# Patient Record
Sex: Male | Born: 1950 | Race: White | Hispanic: No | Marital: Married | Smoking: Former smoker
Health system: Southern US, Community
[De-identification: ages and names within clinical notes are randomized; demographics above are authoritative.]

## PROBLEM LIST (undated history)

## (undated) DIAGNOSIS — E785 Hyperlipidemia, unspecified: Secondary | ICD-10-CM

## (undated) DIAGNOSIS — M1990 Unspecified osteoarthritis, unspecified site: Secondary | ICD-10-CM

## (undated) DIAGNOSIS — I82409 Acute embolism and thrombosis of unspecified deep veins of unspecified lower extremity: Secondary | ICD-10-CM

## (undated) DIAGNOSIS — I340 Nonrheumatic mitral (valve) insufficiency: Secondary | ICD-10-CM

## (undated) DIAGNOSIS — F329 Major depressive disorder, single episode, unspecified: Secondary | ICD-10-CM

## (undated) DIAGNOSIS — D649 Anemia, unspecified: Secondary | ICD-10-CM

## (undated) DIAGNOSIS — E669 Obesity, unspecified: Secondary | ICD-10-CM

## (undated) DIAGNOSIS — G2581 Restless legs syndrome: Secondary | ICD-10-CM

## (undated) DIAGNOSIS — R0600 Dyspnea, unspecified: Secondary | ICD-10-CM

## (undated) DIAGNOSIS — E119 Type 2 diabetes mellitus without complications: Secondary | ICD-10-CM

## (undated) DIAGNOSIS — I351 Nonrheumatic aortic (valve) insufficiency: Principal | ICD-10-CM

## (undated) DIAGNOSIS — G4733 Obstructive sleep apnea (adult) (pediatric): Secondary | ICD-10-CM

## (undated) DIAGNOSIS — Z8679 Personal history of other diseases of the circulatory system: Secondary | ICD-10-CM

## (undated) DIAGNOSIS — I509 Heart failure, unspecified: Secondary | ICD-10-CM

## (undated) DIAGNOSIS — I2510 Atherosclerotic heart disease of native coronary artery without angina pectoris: Secondary | ICD-10-CM

## (undated) DIAGNOSIS — F32A Depression, unspecified: Secondary | ICD-10-CM

## (undated) DIAGNOSIS — I10 Essential (primary) hypertension: Secondary | ICD-10-CM

## (undated) DIAGNOSIS — K219 Gastro-esophageal reflux disease without esophagitis: Secondary | ICD-10-CM

## (undated) HISTORY — DX: Depression, unspecified: F32.A

## (undated) HISTORY — DX: Major depressive disorder, single episode, unspecified: F32.9

## (undated) HISTORY — DX: Hyperlipidemia, unspecified: E78.5

## (undated) HISTORY — DX: Acute embolism and thrombosis of unspecified deep veins of unspecified lower extremity: I82.409

## (undated) HISTORY — DX: Obstructive sleep apnea (adult) (pediatric): G47.33

## (undated) HISTORY — DX: Dyspnea, unspecified: R06.00

## (undated) HISTORY — PX: COLONOSCOPY W/ POLYPECTOMY: SHX1380

## (undated) HISTORY — DX: Obesity, unspecified: E66.9

## (undated) HISTORY — DX: Nonrheumatic mitral (valve) insufficiency: I34.0

## (undated) HISTORY — DX: Type 2 diabetes mellitus without complications: E11.9

## (undated) HISTORY — PX: ESOPHAGOGASTRODUODENOSCOPY ENDOSCOPY: SHX5814

## (undated) HISTORY — DX: Atherosclerotic heart disease of native coronary artery without angina pectoris: I25.10

## (undated) HISTORY — PX: IVC FILTER PLACEMENT (ARMC HX): HXRAD1551

## (undated) HISTORY — DX: Nonrheumatic aortic (valve) insufficiency: I35.1

## (undated) HISTORY — DX: Personal history of other diseases of the circulatory system: Z86.79

## (undated) HISTORY — DX: Essential (primary) hypertension: I10

## (undated) HISTORY — PX: THYROIDECTOMY: SHX17

---

## 2006-06-24 HISTORY — PX: CARDIAC CATHETERIZATION: SHX172

## 2007-01-02 ENCOUNTER — Ambulatory Visit: Payer: Self-pay | Admitting: Cardiology

## 2007-01-03 ENCOUNTER — Encounter: Payer: Self-pay | Admitting: Cardiology

## 2007-01-15 ENCOUNTER — Ambulatory Visit: Payer: Self-pay | Admitting: Cardiology

## 2007-01-20 ENCOUNTER — Ambulatory Visit: Payer: Self-pay | Admitting: Cardiovascular Disease

## 2007-01-20 ENCOUNTER — Inpatient Hospital Stay (HOSPITAL_BASED_OUTPATIENT_CLINIC_OR_DEPARTMENT_OTHER): Admission: RE | Admit: 2007-01-20 | Discharge: 2007-01-20 | Payer: Self-pay | Admitting: Cardiovascular Disease

## 2007-01-23 ENCOUNTER — Ambulatory Visit: Payer: Self-pay | Admitting: Physician Assistant

## 2007-01-27 ENCOUNTER — Ambulatory Visit: Payer: Self-pay | Admitting: Cardiology

## 2007-02-03 ENCOUNTER — Ambulatory Visit: Payer: Self-pay | Admitting: Cardiology

## 2007-02-10 ENCOUNTER — Ambulatory Visit: Payer: Self-pay | Admitting: Cardiology

## 2007-02-20 ENCOUNTER — Ambulatory Visit: Payer: Self-pay | Admitting: Cardiology

## 2007-03-05 ENCOUNTER — Ambulatory Visit: Payer: Self-pay | Admitting: Cardiology

## 2007-03-23 ENCOUNTER — Ambulatory Visit: Payer: Self-pay | Admitting: Cardiology

## 2007-04-20 ENCOUNTER — Ambulatory Visit: Payer: Self-pay | Admitting: Cardiology

## 2007-05-04 ENCOUNTER — Ambulatory Visit: Payer: Self-pay | Admitting: Cardiology

## 2007-05-20 ENCOUNTER — Ambulatory Visit: Payer: Self-pay | Admitting: Cardiology

## 2007-05-26 ENCOUNTER — Ambulatory Visit: Payer: Self-pay | Admitting: Cardiology

## 2007-06-10 ENCOUNTER — Ambulatory Visit: Payer: Self-pay | Admitting: Cardiology

## 2007-07-06 ENCOUNTER — Ambulatory Visit: Payer: Self-pay | Admitting: Cardiology

## 2007-07-20 ENCOUNTER — Ambulatory Visit: Payer: Self-pay | Admitting: Cardiology

## 2007-08-10 ENCOUNTER — Ambulatory Visit: Payer: Self-pay | Admitting: Cardiology

## 2007-09-01 ENCOUNTER — Ambulatory Visit: Payer: Self-pay | Admitting: Cardiology

## 2007-09-03 ENCOUNTER — Ambulatory Visit: Payer: Self-pay | Admitting: Cardiology

## 2007-09-07 ENCOUNTER — Ambulatory Visit: Payer: Self-pay | Admitting: Cardiology

## 2007-09-10 ENCOUNTER — Encounter: Admission: RE | Admit: 2007-09-10 | Discharge: 2007-09-10 | Payer: Self-pay | Admitting: Endocrinology

## 2007-09-10 ENCOUNTER — Ambulatory Visit: Payer: Self-pay | Admitting: Cardiology

## 2007-09-10 ENCOUNTER — Encounter (INDEPENDENT_AMBULATORY_CARE_PROVIDER_SITE_OTHER): Payer: Self-pay | Admitting: Interventional Radiology

## 2007-09-10 ENCOUNTER — Other Ambulatory Visit: Admission: RE | Admit: 2007-09-10 | Discharge: 2007-09-10 | Payer: Self-pay | Admitting: Interventional Radiology

## 2007-12-21 ENCOUNTER — Ambulatory Visit: Payer: Self-pay | Admitting: Cardiology

## 2007-12-26 ENCOUNTER — Encounter: Payer: Self-pay | Admitting: Cardiology

## 2008-01-29 ENCOUNTER — Ambulatory Visit: Payer: Self-pay | Admitting: Cardiology

## 2008-02-03 ENCOUNTER — Ambulatory Visit: Payer: Self-pay | Admitting: Cardiology

## 2008-02-11 ENCOUNTER — Ambulatory Visit: Payer: Self-pay | Admitting: Cardiology

## 2008-02-19 ENCOUNTER — Encounter (HOSPITAL_BASED_OUTPATIENT_CLINIC_OR_DEPARTMENT_OTHER): Payer: Self-pay | Admitting: General Surgery

## 2008-02-19 ENCOUNTER — Ambulatory Visit (HOSPITAL_COMMUNITY): Admission: RE | Admit: 2008-02-19 | Discharge: 2008-02-20 | Payer: Self-pay | Admitting: General Surgery

## 2008-02-24 ENCOUNTER — Ambulatory Visit: Payer: Self-pay | Admitting: Cardiology

## 2008-02-26 ENCOUNTER — Ambulatory Visit: Payer: Self-pay | Admitting: Cardiology

## 2008-03-01 ENCOUNTER — Ambulatory Visit: Payer: Self-pay | Admitting: Cardiology

## 2008-03-08 ENCOUNTER — Encounter: Payer: Self-pay | Admitting: Cardiology

## 2008-03-09 ENCOUNTER — Ambulatory Visit: Payer: Self-pay | Admitting: Cardiology

## 2008-04-05 ENCOUNTER — Ambulatory Visit: Payer: Self-pay | Admitting: Cardiology

## 2008-05-03 ENCOUNTER — Ambulatory Visit: Payer: Self-pay | Admitting: Cardiology

## 2008-05-13 ENCOUNTER — Ambulatory Visit: Payer: Self-pay | Admitting: Cardiology

## 2008-05-18 ENCOUNTER — Ambulatory Visit: Payer: Self-pay | Admitting: Cardiology

## 2008-06-09 ENCOUNTER — Encounter: Payer: Self-pay | Admitting: Cardiology

## 2008-07-28 ENCOUNTER — Ambulatory Visit: Payer: Self-pay | Admitting: Cardiology

## 2008-08-09 ENCOUNTER — Encounter: Payer: Self-pay | Admitting: Cardiology

## 2008-08-09 ENCOUNTER — Ambulatory Visit: Payer: Self-pay | Admitting: Cardiology

## 2008-09-28 ENCOUNTER — Encounter: Admission: RE | Admit: 2008-09-28 | Discharge: 2008-12-27 | Payer: Self-pay | Admitting: Endocrinology

## 2008-09-29 ENCOUNTER — Ambulatory Visit: Payer: Self-pay | Admitting: Cardiology

## 2008-10-07 ENCOUNTER — Ambulatory Visit: Payer: Self-pay | Admitting: Cardiology

## 2008-10-14 ENCOUNTER — Ambulatory Visit: Payer: Self-pay | Admitting: Cardiology

## 2008-10-25 ENCOUNTER — Ambulatory Visit: Payer: Self-pay | Admitting: Cardiology

## 2008-11-08 ENCOUNTER — Ambulatory Visit: Payer: Self-pay | Admitting: Cardiology

## 2008-11-18 ENCOUNTER — Encounter: Payer: Self-pay | Admitting: Cardiology

## 2008-11-24 ENCOUNTER — Ambulatory Visit: Payer: Self-pay | Admitting: Cardiology

## 2008-12-01 ENCOUNTER — Encounter: Payer: Self-pay | Admitting: Cardiology

## 2008-12-02 ENCOUNTER — Ambulatory Visit: Payer: Self-pay | Admitting: Cardiology

## 2009-01-02 ENCOUNTER — Ambulatory Visit: Payer: Self-pay | Admitting: Cardiology

## 2009-02-06 ENCOUNTER — Encounter: Payer: Self-pay | Admitting: *Deleted

## 2009-02-14 ENCOUNTER — Ambulatory Visit: Payer: Self-pay | Admitting: Cardiology

## 2009-02-14 LAB — CONVERTED CEMR LAB
POC INR: 2.8
Prothrombin Time: 20.4 s

## 2009-02-20 ENCOUNTER — Ambulatory Visit: Payer: Self-pay | Admitting: Cardiology

## 2009-02-20 DIAGNOSIS — F329 Major depressive disorder, single episode, unspecified: Secondary | ICD-10-CM | POA: Insufficient documentation

## 2009-02-20 DIAGNOSIS — R079 Chest pain, unspecified: Secondary | ICD-10-CM | POA: Insufficient documentation

## 2009-02-20 DIAGNOSIS — L989 Disorder of the skin and subcutaneous tissue, unspecified: Secondary | ICD-10-CM | POA: Insufficient documentation

## 2009-02-20 DIAGNOSIS — I87009 Postthrombotic syndrome without complications of unspecified extremity: Secondary | ICD-10-CM | POA: Insufficient documentation

## 2009-02-20 DIAGNOSIS — I82409 Acute embolism and thrombosis of unspecified deep veins of unspecified lower extremity: Secondary | ICD-10-CM | POA: Insufficient documentation

## 2009-02-20 DIAGNOSIS — I10 Essential (primary) hypertension: Secondary | ICD-10-CM | POA: Insufficient documentation

## 2009-02-20 DIAGNOSIS — I2510 Atherosclerotic heart disease of native coronary artery without angina pectoris: Secondary | ICD-10-CM | POA: Insufficient documentation

## 2009-02-20 DIAGNOSIS — F32A Depression, unspecified: Secondary | ICD-10-CM | POA: Insufficient documentation

## 2009-02-20 DIAGNOSIS — T79A29A Traumatic compartment syndrome of unspecified lower extremity, initial encounter: Secondary | ICD-10-CM

## 2009-02-20 DIAGNOSIS — G4733 Obstructive sleep apnea (adult) (pediatric): Secondary | ICD-10-CM | POA: Insufficient documentation

## 2009-02-20 LAB — CONVERTED CEMR LAB
POC INR: 5.6
Prothrombin Time: 28.6 s

## 2009-02-21 ENCOUNTER — Inpatient Hospital Stay (HOSPITAL_COMMUNITY): Admission: AD | Admit: 2009-02-21 | Discharge: 2009-02-23 | Payer: Self-pay | Admitting: Cardiology

## 2009-02-21 ENCOUNTER — Encounter: Payer: Self-pay | Admitting: Cardiology

## 2009-02-21 ENCOUNTER — Telehealth: Payer: Self-pay | Admitting: Cardiology

## 2009-02-21 ENCOUNTER — Ambulatory Visit: Payer: Self-pay | Admitting: Vascular Surgery

## 2009-02-21 ENCOUNTER — Ambulatory Visit: Payer: Self-pay | Admitting: Internal Medicine

## 2009-02-22 ENCOUNTER — Encounter: Payer: Self-pay | Admitting: Cardiology

## 2009-02-28 ENCOUNTER — Encounter: Payer: Self-pay | Admitting: Cardiology

## 2009-03-03 ENCOUNTER — Encounter (INDEPENDENT_AMBULATORY_CARE_PROVIDER_SITE_OTHER): Payer: Self-pay | Admitting: *Deleted

## 2009-03-09 ENCOUNTER — Ambulatory Visit: Payer: Self-pay | Admitting: Cardiology

## 2009-03-15 ENCOUNTER — Encounter: Payer: Self-pay | Admitting: Cardiology

## 2009-03-16 ENCOUNTER — Encounter: Payer: Self-pay | Admitting: Cardiology

## 2009-03-17 ENCOUNTER — Encounter: Payer: Self-pay | Admitting: Cardiology

## 2009-03-29 ENCOUNTER — Encounter: Payer: Self-pay | Admitting: Cardiology

## 2009-04-05 ENCOUNTER — Telehealth: Payer: Self-pay | Admitting: Cardiology

## 2009-04-11 ENCOUNTER — Encounter: Payer: Self-pay | Admitting: Cardiology

## 2009-04-14 ENCOUNTER — Encounter: Payer: Self-pay | Admitting: Physician Assistant

## 2009-04-18 ENCOUNTER — Telehealth (INDEPENDENT_AMBULATORY_CARE_PROVIDER_SITE_OTHER): Payer: Self-pay | Admitting: *Deleted

## 2009-04-27 ENCOUNTER — Encounter: Payer: Self-pay | Admitting: Cardiology

## 2009-04-28 ENCOUNTER — Telehealth (INDEPENDENT_AMBULATORY_CARE_PROVIDER_SITE_OTHER): Payer: Self-pay | Admitting: *Deleted

## 2009-05-01 ENCOUNTER — Encounter: Payer: Self-pay | Admitting: Cardiology

## 2009-05-02 ENCOUNTER — Encounter: Payer: Self-pay | Admitting: Cardiology

## 2009-05-04 ENCOUNTER — Encounter: Payer: Self-pay | Admitting: Cardiology

## 2009-07-14 ENCOUNTER — Encounter: Payer: Self-pay | Admitting: Cardiology

## 2009-07-21 ENCOUNTER — Encounter (INDEPENDENT_AMBULATORY_CARE_PROVIDER_SITE_OTHER): Payer: Self-pay | Admitting: *Deleted

## 2009-07-21 ENCOUNTER — Ambulatory Visit: Payer: Self-pay | Admitting: Cardiology

## 2009-07-21 DIAGNOSIS — R0602 Shortness of breath: Secondary | ICD-10-CM

## 2009-07-23 ENCOUNTER — Encounter: Payer: Self-pay | Admitting: Cardiology

## 2009-07-24 ENCOUNTER — Encounter: Payer: Self-pay | Admitting: Cardiology

## 2009-07-25 ENCOUNTER — Ambulatory Visit: Payer: Self-pay | Admitting: Cardiovascular Disease

## 2009-07-25 ENCOUNTER — Inpatient Hospital Stay (HOSPITAL_BASED_OUTPATIENT_CLINIC_OR_DEPARTMENT_OTHER): Admission: RE | Admit: 2009-07-25 | Discharge: 2009-07-25 | Payer: Self-pay | Admitting: Cardiovascular Disease

## 2009-07-25 HISTORY — PX: CARDIAC CATHETERIZATION: SHX172

## 2009-08-07 ENCOUNTER — Ambulatory Visit: Payer: Self-pay | Admitting: Cardiology

## 2009-12-14 ENCOUNTER — Encounter: Payer: Self-pay | Admitting: Cardiology

## 2009-12-15 ENCOUNTER — Ambulatory Visit: Payer: Self-pay | Admitting: Cardiology

## 2009-12-16 ENCOUNTER — Encounter: Payer: Self-pay | Admitting: Cardiology

## 2009-12-18 ENCOUNTER — Encounter: Payer: Self-pay | Admitting: Cardiology

## 2009-12-19 ENCOUNTER — Encounter: Payer: Self-pay | Admitting: Cardiology

## 2009-12-19 ENCOUNTER — Ambulatory Visit (HOSPITAL_COMMUNITY): Admission: RE | Admit: 2009-12-19 | Discharge: 2009-12-19 | Payer: Self-pay | Admitting: Internal Medicine

## 2009-12-20 ENCOUNTER — Ambulatory Visit: Payer: Self-pay | Admitting: Cardiology

## 2010-01-01 ENCOUNTER — Encounter: Payer: Self-pay | Admitting: Cardiology

## 2010-01-08 ENCOUNTER — Encounter: Payer: Self-pay | Admitting: Cardiology

## 2010-01-10 ENCOUNTER — Encounter: Payer: Self-pay | Admitting: Cardiology

## 2010-01-19 ENCOUNTER — Encounter: Payer: Self-pay | Admitting: Cardiology

## 2010-01-22 ENCOUNTER — Encounter: Payer: Self-pay | Admitting: Cardiology

## 2010-01-25 ENCOUNTER — Encounter: Payer: Self-pay | Admitting: Cardiology

## 2010-01-29 ENCOUNTER — Encounter: Payer: Self-pay | Admitting: Cardiology

## 2010-03-20 ENCOUNTER — Ambulatory Visit: Payer: Self-pay | Admitting: Cardiology

## 2010-03-20 DIAGNOSIS — I38 Endocarditis, valve unspecified: Secondary | ICD-10-CM

## 2010-03-22 ENCOUNTER — Encounter: Payer: Self-pay | Admitting: Cardiology

## 2010-03-26 ENCOUNTER — Encounter: Payer: Self-pay | Admitting: Cardiology

## 2010-03-30 ENCOUNTER — Encounter (INDEPENDENT_AMBULATORY_CARE_PROVIDER_SITE_OTHER): Payer: Self-pay | Admitting: *Deleted

## 2010-07-13 ENCOUNTER — Telehealth (INDEPENDENT_AMBULATORY_CARE_PROVIDER_SITE_OTHER): Payer: Self-pay | Admitting: *Deleted

## 2010-07-24 NOTE — Assessment & Plan Note (Signed)
Summary: 6 MO FU PER AUG REMINDER-SRS   Visit Type:  Follow-up Primary Provider:  Lynden Oxford   History of Present Illness: the patient is a 60 year old male with a history of hypercoagulable state on chronic Lovenox therapy. 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 This was a pansensitive organism with vegetation on the mitral valve. Patient underwent an eight-week course of antibiotics through a PICC line. The PICC line has been removed. His most recent blood work is from August 2011 his creatinine was 1.47 high sensitivity CRP was within normal limits hemoglobin was 10.9 and sedimentation rate 41 mm/h. Blood work was taken when the patient is still taking antibiotics.  The patient is doing well. He denies any substernal chest pain. He denies any shortness of breath. He reports no recurrent fevers or chills nausea or vomiting. He has no palpitations or syncope.  Preventive Screening-Counseling & Management  Alcohol-Tobacco     Smoking Status: quit     Year Quit: 1970  Current Medications (verified): 1)  Simvastatin 40 Mg Tabs (Simvastatin) .... Take 1 Tablet By Mouth At Bedtime 2)  Lisinopril 20 Mg Tabs (Lisinopril) .... Take One Tablet By Mouth Daily 3)  Metformin Hcl 500 Mg Tabs (Metformin Hcl) .... Take 1 Tablet By Mouth Two Times A Day 4)  Levothroid 200 Mcg Tabs (Levothyroxine Sodium) .... Take 1 Tablet By Mouth Once A Day 5)  Chlorthalidone 25 Mg Tabs (Chlorthalidone) .... Take 1 Tablet By Mouth Once A Day 6)  Prilosec 20 Mg Cpdr (Omeprazole) .... Take 1 Tablet By Mouth Once A Day 7)  Hydrocodone-Acetaminophen 5-500 Mg Tabs (Hydrocodone-Acetaminophen) .... As Needed Pain 8)  Nitroglycerin 0.4 Mg Subl (Nitroglycerin) .... One Tablet Under Tongue Every 5 Minutes As Needed For Chest Pain---May Repeat Times Three 9)  Lovenox 120 Mg/0.77ml Soln (Enoxaparin Sodium) .... Inject Sub Q Two Times A Day 10)   Labetalol Hcl 200 Mg Tabs (Labetalol Hcl) .... Take 1 Tablet By Mouth Two Times A Day  (Place On File) 11)  Cymbalta 60 Mg Cpep (Duloxetine Hcl) .... Take 1 Tablet By Mouth Every Morning 12)  Tylenol 325 Mg Tabs (Acetaminophen) .... As Needed 13)  Imdur 30 Mg Xr24h-Tab (Isosorbide Mononitrate) .... Take 1 Tablet By Mouth Once A Day 14)  Clonazepam 0.5 Mg Tabs (Clonazepam) .... Take 1 Tablet By Mouth Once A Day  Allergies (verified): No Known Drug Allergies  Comments:  Nurse/Medical Assistant: The patient's medication list and allergies were reviewed with the patient and were updated in the Medication and Allergy Lists.  Past History:  Family History: Last updated: 02/20/2009 Family History of Coronary Artery Disease:   Social History: Last updated: 02/20/2009 Disabled  Married  Tobacco Use - No.  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  Review of Systems       The patient complains of muscle weakness, leg swelling, and depression.  The  patient denies fatigue, malaise, fever, weight gain/loss, vision loss, decreased hearing, hoarseness, chest pain, palpitations, shortness of breath, prolonged cough, wheezing, sleep apnea, coughing up blood, abdominal pain, blood  in stool, nausea, vomiting, diarrhea, heartburn, incontinence, blood in urine, joint pain, rash, skin lesions, headache, fainting, dizziness, anxiety, enlarged lymph nodes, easy bruising or bleeding, and environmental allergies.    Vital Signs:  Patient profile:   60 year old male Height:      71 inches Weight:      294 pounds BMI:     41.15 Pulse rate:   80 / minute BP sitting:   112 / 69  (left arm) Cuff size:   large  Vitals Entered By: Carlye Grippe (March 20, 2010 10:04 AM)  Nutrition Counseling: Patient's BMI is greater than 25 and therefore counseled on weight management options.  Physical Exam  Additional Exam:  General: Well-developed, well-nourished in no distress head: Normocephalic and atraumatic eyes PERRLA/EOMI intact, conjunctiva and lids normal nose: No deformity or lesions mouth normal dentition, normal posterior pharynx neck: Supple, no JVD.  No masses, thyromegaly or abnormal cervical nodes lungs: Normal breath sounds bilaterally without wheezing.  Normal percussion heart: regular rate and rhythm with normal S1 and S2, no S3 or S4.  PMI is normal.  No pathological murmurs abdomen: Normal bowel sounds, abdomen is soft and nontender without masses, organomegaly or hernias noted.  No hepatosplenomegaly musculoskeletal: Back normal, normal gait muscle strength and tone normal pulsus: Pulse is normal in all 4 extremities Extremities:one to 2+ peripheral pitting edema.  Marked lower extremity varicosities. neurologic: Alert and oriented x 3 skin: Intact without lesions or rashes cervical nodes: No significant adenopathy psychologic: Normal affect    Impression & Recommendations:  Problem # 1:  ENDOCARDITIS (ICD-424.90) the patient is status post successful treatment for endocarditis. He has no recurrent fever or chills. Antibiotics have been discontinued.  Problem # 2:  COUMADIN THERAPY (ICD-V58.61) the patient is on chronic Lovenox therapy status post IVC  filter. 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 One of the newer direct antithrombin agents or antiXa inhibitors may be used once approved by the FDA.  Problem # 3:  SLEEP APNEA, OBSTRUCTIVE (ICD-327.23) Assessment: Comment Only  Problem # 4:  POSTPHLEBITIC SYNDROME (ICD-459.10) Assessment: Comment Only  Problem # 5:  CORONARY ARTERY DISEASE (ICD-414.00) the patient reports no recurrent chest pain. His blood pressures now also well controlled with a combination of labetalol and chlorthalidone. His updated medication list for this problem includes:    Lisinopril 20 Mg Tabs (Lisinopril) .Marland Kitchen... Take one tablet by mouth daily    Nitroglycerin 0.4 Mg Subl (Nitroglycerin) ..... One tablet under tongue every 5 minutes as needed for chest pain---may repeat times three    Lovenox 120 Mg/0.55ml Soln (Enoxaparin sodium) ..... Inject sub q two times a day    Labetalol Hcl 200 Mg Tabs (Labetalol hcl) .Marland Kitchen... Take 1 tablet by mouth two times a day  (place on file)    Imdur 30 Mg Xr24h-tab (Isosorbide mononitrate) .Marland Kitchen... Take 1 tablet by mouth once a day  Patient Instructions: 1)  Your physician recommends that you continue on your current medications as directed. Please refer to the Current Medication list given to you today. 2)  Follow up in  6 months

## 2010-07-24 NOTE — Letter (Signed)
Summary: Cardiac Cath Instructions - [facility]  [facility] [address]   [place], [place] [address]   Phone: [phone]  Fax: [phone]     07/21/2009 MRN: [id number]  [facility] [address] [place], [place]  [address]  Dear Mr. [name] [name],   You are scheduled for a Cardiac Catheterization on Tuesday, July 25, 2009 at 8:30 with Dr. Excell [name].  Please arrive to the 1st floor of the Heart and Vascular Center at [facility] at 7:30am / pm on the day of your procedure. Please do not arrive before 6:30 a.m. Call the Heart and Vascular Center at [phone] if you are unable to make your appointmnet. The Code to get into the parking garage under the building is 0900. Take the elevators to the 1st floor. You must have someone to drive you home. Someone must be with you for the first 24 hours after you arrive home. Please wear clothes that are easy to get on and off and wear slip-on shoes. Do not eat or drink after midnight except water with your medications that morning. Bring all your medications and current insurance cards with you.  _X__ DO NOT take these medications before your procedure:  HCTZ - a.m of                procedure, Metformin - 24 hours before & 48 hours after.  ___ Make sure you take your aspirin.  _X__ You may take ALL of your medications with water that morning except           those listed above.  ___ DO NOT take ANY medications before your procedure.  ___ Pre-med instructions:  ________________________________________________________________________  The usual length of stay after your procedure is 2 to 3 hours. This can vary.  If you have any questions, please call the office at the number listed above.  Hoover Brunette, LPN          Directions to the [facility]  Please Note : Park in [place] under the building not the parking deck.  From [employer]: Turn onto [address] Left  onto [place] (1st stoplight) Right at the brick entrance to the hospital ([address]) Bear to the right and you will see a blue sign "Heart and Vascular Center" Parking garage is a sharp right'to get through the gate out in the code _______. Once you park, take the elevator to the first floor. Please do not arrive before 0630am. The building will be dark before that time.   From [address] Turn onto [address] Turn left into the brick entrance to the hospital (Main circle [address]) Bear to the right and you will see a blue sign "[facility] Center" Parking garage is a sharp right, to get thru the gate put in the code ____. Once you park, take the elevator to the first floor. Please do not arrive before 0630am. The building will be dark before that time

## 2010-07-24 NOTE — Letter (Signed)
Summary: External Correspondence/ FAXED PRE-CATH ORDER  External Correspondence/ FAXED PRE-CATH ORDER   Imported By: Dorise Hiss 07/25/2009 15:17:21  _____________________________________________________________________  External Attachment:    Type:   Image     Comment:   External Document

## 2010-07-24 NOTE — Medication Information (Signed)
Summary: RX Folder/ FAXED LOVENOX REIMBUREMENT PROGRAM  RX Folder/ FAXED LOVENOX REIMBUREMENT PROGRAM   Imported By: Dorise Hiss 01/19/2010 14:11:19  _____________________________________________________________________  External Attachment:    Type:   Image     Comment:   External Document

## 2010-07-24 NOTE — Miscellaneous (Signed)
Summary: rx - lovenox  Clinical Lists Changes  Medications: Rx of LOVENOX 120 MG/0.8ML SOLN (ENOXAPARIN SODIUM) inject sub q two times a day;  #90 x 3;  Signed;  Entered by: Hoover Brunette, LPN;  Authorized by: Lewayne Bunting, MD, [facility];  Method used: Print then Give to Patient    Prescriptions: LOVENOX 120 MG/0.8ML SOLN (ENOXAPARIN SODIUM) inject sub q two times a day  #90 x 3   Entered by:   Hoover Brunette, LPN   Authorized by:   Lewayne Bunting, MD, [facility]   Signed by:   Hoover Brunette, LPN on 13/24/4010   Method used:   Print then Give to Patient   RxID:   [id number]

## 2010-07-24 NOTE — Medication Information (Signed)
Summary: RX Folder/ FAXED LOVENOX REIMBURSEMENT  RX Folder/ FAXED LOVENOX REIMBURSEMENT   Imported By: Dorise Hiss 01/26/2010 10:08:24  _____________________________________________________________________  External Attachment:    Type:   Image     Comment:   External Document

## 2010-07-24 NOTE — Assessment & Plan Note (Signed)
Summary: EPH-POST CATH   Visit Type:  Follow-up Primary Provider:  Kristian Covey PA  CC:  follow-up visit.  History of Present Illness: the patient is a 60 year old male with a history of obstructive sleep apnea, recurrent DVT and hypercoagulable state. Recently the patient reports increased substernal chest pain. He was referred for cardiac catheterization and was found to have nonobstructive CAD. He has normal LV function. He feels fatigued and is tired most of the time. He is struggling with significant depression. His blood pressures also poorly controlled. He reports no complications at this cardiac catheterization. He denies any orthopnea PND palpitations or syncope.  Preventive Screening-Counseling & Management  Alcohol-Tobacco     Smoking Status: quit     Year Quit: 1970  Clinical Review Panels:  Echocardiogram Echocardiogram 1. No diagnostic ST changes by standard criteria during dobutamine infusion.  PAC's and atrial runs noted.  No chest pain.         2. No clearly inducible wall motion abnormalities to suggest ischemia.          Appropriate augmentation in systolic function. (08/09/2008)    Current Medications (verified): 1)  Simvastatin 40 Mg Tabs (Simvastatin) .... Take 1 Tablet By Mouth At Bedtime 2)  Lisinopril 20 Mg Tabs (Lisinopril) .... Take One Tablet By Mouth Daily 3)  Metformin Hcl 500 Mg Tabs (Metformin Hcl) .... Take 1 Tablet By Mouth Two Times A Day 4)  Levothroid 200 Mcg Tabs (Levothyroxine Sodium) .... Take 1 Tablet By Mouth Once A Day 5)  Chlorthalidone 25 Mg Tabs (Chlorthalidone) .... Take 1 Tablet By Mouth Once A Day 6)  Prilosec 20 Mg Cpdr (Omeprazole) .... Take 1 Tablet By Mouth Once A Day 7)  Hydrocodone-Acetaminophen 5-500 Mg Tabs (Hydrocodone-Acetaminophen) .... As Needed Pain 8)  Nitroglycerin 0.4 Mg Subl (Nitroglycerin) .... One Tablet Under Tongue Every 5 Minutes As Needed For Chest Pain---May Repeat Times Three 9)  Lovenox 120 Mg/0.71ml Soln  (Enoxaparin Sodium) .... Inject Sub Q Two Times A Day 10)  Labetalol Hcl 200 Mg Tabs (Labetalol Hcl) .... Take 1 Tablet By Mouth Two Times A Day  (Place On File) 11)  Cymbalta 60 Mg Cpep (Duloxetine Hcl) .... Take 1 Tablet By Mouth Every Morning 12)  Tylenol 325 Mg Tabs (Acetaminophen) .... As Needed 13)  Imdur 30 Mg Xr24h-Tab (Isosorbide Mononitrate) .... Take 1 Tablet By Mouth Once A Day 14)  Clonazepam 0.5 Mg Tabs (Clonazepam) .... Take 1 Tablet By Mouth Once A Day  Allergies (verified): No Known Drug Allergies  Comments:  Nurse/Medical Assistant: The patient's medications and allergies were reviewed with the patient and were updated in the Medication and Allergy Lists. List reviewed.  Past History:  Family History: Last updated: 02/20/2009 Family History of Coronary Artery Disease:   Social History: Last updated: 02/20/2009 Disabled  Married  Tobacco Use - No.  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 nonobstructive coronary artery disease by catheterization in 2008 with normal LV function, nonobstructive coronary artery disease by repeat cardiac catheterization in February of 2010 hypertension controlled obesity remote tobacco use  Review of Systems       The patient complains of fatigue, weight gain/loss, and depression.  The patient denies malaise, fever, vision loss, decreased hearing, hoarseness,  chest pain, palpitations, prolonged cough, wheezing, sleep apnea, coughing up blood, abdominal pain, blood in stool, nausea, vomiting, diarrhea, heartburn, incontinence, blood in urine, muscle weakness, joint pain, leg swelling, rash,  skin lesions, headache, fainting, dizziness, anxiety, enlarged lymph nodes, easy bruising or bleeding, and environmental allergies.    Vital Signs:  Patient profile:   60 year old male Height:      71 inches Weight:      319 pounds Pulse rate:   90 / minute BP sitting:   159 / 100  (left arm) Cuff size:   large  Vitals Entered By: Carlye Grippe (August 07, 2009 2:45 PM)  Serial Vital Signs/Assessments:  Time      Position  BP       Pulse  Resp  Temp     By 2:51 PM             142/91   88                    Carlye Grippe  CC: follow-up visit    Impression & Recommendations:  Problem # 1:  HYPERTENSION, UNSPECIFIED (ICD-401.9) the patient's blood pressure is poorly controlled. I change hydrochlorothiazide to chlorthalidone 25 mg p.o. q. daily and also increase labetalol to 200 mg p.o. b.i.d. His updated medication list for this problem includes:    Lisinopril 20 Mg Tabs (Lisinopril) .Marland Kitchen... Take one tablet by mouth daily    Chlorthalidone 25 Mg Tabs (Chlorthalidone) .Marland Kitchen... Take 1 tablet by mouth once a day    Labetalol Hcl 200 Mg Tabs (Labetalol hcl) .Marland Kitchen... Take 1 tablet by mouth two times a day  (place on file)  Problem # 2:  CHEST PAIN-UNSPECIFIED (ICD-786.50) the patient has atypical chest pain. He has no definite evidence of ischemia or significant coronary arteries disease by his most recent cardiac catheterization.we will continue with medical therapy. His updated medication list for this problem includes:    Lisinopril 20 Mg Tabs (Lisinopril) .Marland Kitchen... Take one tablet by mouth daily    Nitroglycerin 0.4 Mg Subl (Nitroglycerin) ..... One tablet under tongue every 5 minutes as needed for chest pain---may repeat times three    Lovenox 120 Mg/0.31ml Soln (Enoxaparin sodium) ..... Inject sub q two times a day    Labetalol Hcl 200 Mg Tabs (Labetalol hcl) .Marland Kitchen... Take 1 tablet by mouth two times a day  (place on file)    Imdur 30 Mg Xr24h-tab (Isosorbide mononitrate) .Marland Kitchen... Take 1 tablet  by mouth once a day  Problem # 3:  DEPRESSION (ICD-311) patient has significant depression and I gave him a recommendation of a book to read.  Problem # 4:  COUMADIN THERAPY (ICD-V58.61) the patient is not on Coumadin therapy but the long-term Lovenox. He had no complications after his recent catheterization from the right arm.  Patient Instructions: 1)  Increase Labetalol to 200mg  two times a day  2)  Change HCTZ to Chlorthalidone 25mg  daily 3)  Follow up in  6 months. Prescriptions: CHLORTHALIDONE 25 MG TABS (CHLORTHALIDONE) Take 1 tablet by mouth once a day  #30 x 6   Entered by:   Hoover Brunette, LPN   Authorized by:   Lewayne Bunting, MD, [facility]   Signed by:   Hoover Brunette, LPN on 04/54/0981   Method used:   Electronically to        [facility]  [address] [id number] (retail)       [address]  [place], [place]  [address]       Ph: [phone] or [phone]       Fax: [phone]   RxID:   [id number] LABETALOL HCL 200 MG TABS (LABETALOL HCL) Take 1 tablet by mouth two times a day  (place on file)  #60 x 6   Entered by:   Hoover Brunette, LPN   Authorized by:   Lewayne Bunting, MD, [facility]   Signed by:   Hoover Brunette, LPN on 18/84/1660   Method used:   Electronically to        [facility]  [address] [id number] (retail)       [address]       [place], [place]  [address]       Ph: [phone] or [phone]       Fax: [phone]   RxID:   [id number]

## 2010-07-24 NOTE — Letter (Signed)
Summary: Engineer, materials at [facility]  [address]   [place], [place] [address]   Phone: [phone]  Fax: [phone]        March 30, 2010 MRN: [id number]   [facility] [address] [place], [place]  [address]   Dear Mr. Finlayson,  Your test ordered by Selena Batten has been reviewed by your physician (or physician assistant) and was found to be normal or stable. Your physician (or physician assistant) felt no changes were needed at this time.  ____ Echocardiogram  ____ Cardiac Stress Test  __X__ Lab Work  ____ Peripheral vascular study of arms, legs or neck  ____ CT scan or X-ray  ____ Lung or Breathing test  ____ Other:   Thank you.   Hoover Brunette, LPN    Duane Boston, M.D., F.A.C.C. Thressa Sheller, M.D., F.A.C.C. Oneal Grout, M.D., F.A.C.C. Cheree Ditto, M.D., F.A.C.C. Daiva Nakayama, M.D., F.A.C.C. Kenney Houseman, M.D., F.A.C.C. Jeanne Ivan, PA-C

## 2010-07-24 NOTE — Op Note (Signed)
Summary: Operative Report/ PROCEDURE REPORT  Operative Report/ PROCEDURE REPORT   Imported By: Dorise Hiss 01/15/2010 09:45:53  _____________________________________________________________________  External Attachment:    Type:   Image     Comment:   External Document

## 2010-07-24 NOTE — Miscellaneous (Signed)
Summary: Orders Update - BMET,CBC  Clinical Lists Changes  Orders: Added new Test order of T-Basic Metabolic Panel ([id number]) - Signed Added new Test order of T-CBC No Diff ([id number]) - Signed 

## 2010-07-24 NOTE — Letter (Signed)
Summary: External Correspondence/ OFFICE VISIT DAVID TYSINGER  External Correspondence/ OFFICE VISIT DAVID TYSINGER   Imported By: Dorise Hiss 07/18/2009 08:46:29  _____________________________________________________________________  External Attachment:    Type:   Image     Comment:   External Document

## 2010-07-24 NOTE — Miscellaneous (Signed)
Summary: EKG  Clinical Lists Changes  Observations: Added new observation of EKG INTERP: normal sinus rhythm.  Low voltage QRS.  Heart rate 69 beats/min.  Otherwise normal tracing (07/21/2009 13:04)      EKG  Procedure date:  07/21/2009  Findings:      normal sinus rhythm.  Low voltage QRS.  Heart rate 69 beats/min.  Otherwise normal tracing

## 2010-07-24 NOTE — Medication Information (Signed)
Summary: RX Folder/ RECIEVED LOVENOX  RX Folder/ RECIEVED LOVENOX   Imported By: Dorise Hiss 02/02/2010 09:44:07  _____________________________________________________________________  External Attachment:    Type:   Image     Comment:   External Document

## 2010-07-24 NOTE — Assessment & Plan Note (Signed)
Summary: had 3 month fu Tysinger is seeing pat today 07-14-2009   Primary Provider:  [facility]   History of Present Illness: the patient is a 60 year old male with obstructive sleep apnea, recurrent DVT and hypercoagulable state.  The patient is status post IVC filter and is on chronic Lovenox therapy.  Patient was unable to take Coumadin due to highly fluctuating INR levels.  Financially the patient is also not able to afford dabigatran.  The patient has been seen at the correlation clinic at [facility]  The patient also has a significant history of depression and recently his Effexor was changed to Cymbalta.  The latter has improved his mood. The patient seen for consultation due to a 2 1/2 week complaint of chest tightness and shortness of breath on minimal exertion.  The patient was also noticed more swelling in the lower extremity more so than usual.  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  The patient describes his chest pain as substernal, heavy in nature with some radiation to the back, predictably occurring on exertion.  He also feels very fatigued and exhausted most of the time.  He has gained 6 pounds in weight.  He has used nitroglycerin now on several occasions with significant improvement in his symptoms of chest pain.  Clinical Review Panels:  Echocardiogram Echocardiogram 1. No diagnostic ST changes by standard criteria during dobutamine infusion.  PAC's and atrial runs noted.  No chest pain.         2. No clearly inducible wall motion abnormalities to suggest ischemia.          Appropriate augmentation in systolic function. (08/09/2008)  Vascular Studies Venous Doppler No filling defects seen within the lower extremity venous         structures.  There is normal compressibility, phasicity, and         augmentation.                   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                   IMPRESSION:         No evidence of left lower extremity DVT.         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 (05/04/2008)  Cardiac Imaging Cardiac Cath Findings  IMPRESSION:  The patient's chest pain and shortness of breath would   appear to be noncardiac in etiology.  He tolerated the procedure well.   He will follow up with Dr. Linna Darner and Dr. Andee Lineman in [place].      He will resume his Coumadin, probably tomorrow.      Noralyn Pick. Eden Emms, MD, [facility]  (01/20/2007)    Preventive Screening-Counseling & Management  Alcohol-Tobacco     Smoking Status: quit     Year Started: 8 years     Year Quit: 1970  Current Medications (verified): 1)  Simvastatin 40 Mg Tabs (Simvastatin) .... Take 1 Tablet By Mouth At Bedtime 2)  Lisinopril 20 Mg Tabs (Lisinopril) .... Take One Tablet By Mouth Daily 3)  Metformin Hcl 500 Mg Tabs (Metformin Hcl) .... Take 1 Tablet By Mouth Two Times A Day 4)  Levothroid 200 Mcg Tabs (Levothyroxine Sodium) .... Take 1 Tablet By Mouth Once A Day 5)  Hydrochlorothiazide 25  Mg Tabs (Hydrochlorothiazide) .... Take One Tablet By Mouth Daily. 6)  Prilosec 20 Mg Cpdr (Omeprazole) .... Take 1 Tablet By Mouth Once A Day 7)  Hydrocodone-Acetaminophen 5-500 Mg Tabs (Hydrocodone-Acetaminophen) .... As Needed Pain 8)  Nitroglycerin 0.4 Mg Subl (Nitroglycerin) .... One Tablet Under Tongue Every 5 Minutes As Needed For Chest Pain---May Repeat Times Three 9)  Lovenox 120 Mg/0.43ml Soln (Enoxaparin Sodium) .... Inject Sub Q Two Times A Day 10)  Labetalol Hcl 100 Mg Tabs (Labetalol Hcl) .... Take 1 Tablet By Mouth Two Times A Day 11)  Cymbalta 60 Mg Cpep (Duloxetine Hcl) .... Take 1 Tablet By Mouth Every Morning 12)  Tylenol 325 Mg Tabs (Acetaminophen) .... As Needed 13)  Imdur 30 Mg Xr24h-Tab (Isosorbide Mononitrate) .... Take 1 Tablet By Mouth Once A Day  Allergies (verified): No Known Drug Allergies  Past History:  Past  Medical History: Last updated: 02/20/2009 OVERWEIGHT/OBESITY (ICD-278.02) HYPERTENSION, UNSPECIFIED (ICD-401.9) CHEST PAIN-UNSPECIFIED (ICD-786.50) OSA Depression Postphlebitic syndrome secondary to recurrent left lower shin daily faint ecchymosis with chronic thrombosis of the distal left thigh greater saphenous vein. nonobstructive coronary artery disease by catheterization in 2008 with normal LV function hypertension controlled obesity remote tobacco use  Family History: Last updated: 02/20/2009 Family History of Coronary Artery Disease:   Social History: Last updated: 02/20/2009 Disabled  Married  Tobacco Use - No.  Alcohol Use - no Drug Use - no  Risk Factors: Smoking Status: quit (07/21/2009)  Social History: Smoking Status:  quit  Vital Signs:  Patient profile:   60 year old male Height:      71 inches Weight:      316.25 pounds Pulse rate:   65 / minute BP sitting:   121 / 83  (right arm) Cuff size:   large  Vitals Entered By: Hoover Brunette, LPN (July 21, 2009 11:07 AM) Is Patient Diabetic? Yes Comments c/o tightness in chest, SOB x 2 wks ago.  Initial episode passed, but still have off/on SOB.     Physical Exam  Additional Exam:  General: Well-developed, well-nourished in no distress head: Normocephalic and atraumatic eyes PERRLA/EOMI intact, conjunctiva and lids normal nose: No deformity or lesions mouth normal dentition, normal posterior pharynx neck: Supple, no JVD.  No masses, thyromegaly or abnormal cervical nodes lungs: Normal breath sounds bilaterally without wheezing.  Normal percussion heart: regular rate and rhythm with normal S1 and S2, no S3 or S4.  PMI is normal.  No pathological murmurs abdomen: Normal bowel sounds, abdomen is soft and nontender without masses, organomegaly or hernias noted.  No hepatosplenomegaly musculoskeletal: Back normal, normal gait muscle strength and tone normal pulsus: Pulse is normal in all 4  extremities Extremities:one to 2+ peripheral pitting edema.  Marked lower extremity varicosities. neurologic: Alert and oriented x 3 skin: Intact without lesions or rashes cervical nodes: No significant adenopathy psychologic: Normal affect    Impression & Recommendations:  Problem # 1:  SHORTNESS OF BREATH (ICD-786.05) the patient has significant shortness of breath on minimal exertion.  This is associated with exertional chest pain and improvement with nitroglycerin.  Given his multiple risk factors and his prior history of nonobstructive coronary arterie disease, I see no indication for stress testing, but rather proceeding with cardiac catheterization is indicated test.  I discussed the patient with Dr. Excell Seltzer.  He can hold Lovenox the morning of his cardiac catheterization and given his significant history is of lower extremity DVT, we will avoid any manipulation in the groin and Dr. Excell Seltzer will  proceed with a radial case. The following medications were removed from the medication list:    Coreg 12.5 Mg Tabs (Carvedilol) .Marland Kitchen... Take 1 tablet by mouth twice a day His updated medication list for this problem includes:    Lisinopril 20 Mg Tabs (Lisinopril) .Marland Kitchen... Take one tablet by mouth daily    Hydrochlorothiazide 25 Mg Tabs (Hydrochlorothiazide) .Marland Kitchen... Take one tablet by mouth daily.    Labetalol Hcl 100 Mg Tabs (Labetalol hcl) .Marland Kitchen... Take 1 tablet by mouth two times a day  Orders: Cardiac Catheterization (Cardiac Cath) T-Basic Metabolic Panel [phone]) T-CBC w/Diff ([id number]) T-PTT ([id number]) T-Protime, Auto ([id number]) T-Chest x-ray, 2 views (32355)  Problem # 2:  CORONARY ARTERY DISEASE (ICD-414.00) Assessment: Comment Only the patient has been started on indoor 30 minutes p.o. daily in the interim, I explained to the patient that he has to present to the emergency room if he develops resting chest pain. The following medications were removed from the medication list:     Coreg 12.5 Mg Tabs (Carvedilol) .Marland Kitchen... Take 1 tablet by mouth twice a day His updated medication list for this problem includes:    Lisinopril 20 Mg Tabs (Lisinopril) .Marland Kitchen... Take one tablet by mouth daily    Nitroglycerin 0.4 Mg Subl (Nitroglycerin) ..... One tablet under tongue every 5 minutes as needed for chest pain---may repeat times three    Lovenox 120 Mg/0.10ml Soln (Enoxaparin sodium) ..... Inject sub q two times a day    Labetalol Hcl 100 Mg Tabs (Labetalol hcl) .Marland Kitchen... Take 1 tablet by mouth two times a day    Imdur 30 Mg Xr24h-tab (Isosorbide mononitrate) .Marland Kitchen... Take 1 tablet by mouth once a day  Problem # 3:  TRAUMATIC COMPARTMENT SYNDROME LOWER EXTREMITY (ICD-958.92) of note is that the patient was hospitalized several months ago for a large hematoma with early compartment syndrome.  Problem # 4:  DVT (ICD-453.40) patient has a known hypercoagulable state and is followed at the [facility] clinic for coagulation disorders.  Problem # 5:  DEPRESSION (ICD-311) it's unclear the patient's complaint of fatigue and weakness is related to his worsening depression or related to progressive coronary artery disease.  Patient Instructions: 1)  JV Cath with Dr. Excell Seltzer - radial case  2)  Hold Lovenox on morning of procedure only 3)  Hold Metformin 24 hrs and 48 hrs after 4)  Imdur 30mg  daily 5)  Follow up in  1 month Prescriptions: IMDUR 30 MG XR24H-TAB (ISOSORBIDE MONONITRATE) Take 1 tablet by mouth once a day  #30 x 6   Entered by:   Hoover Brunette, LPN   Authorized by:   Lewayne Bunting, MD, [facility]   Signed by:   Hoover Brunette, LPN on 73/22/0254   Method used:   Electronically to        [facility]  [address] [id number] (retail)       [address]       [place], [place]  [address]       Ph: [phone] or [phone]       Fax: [phone]   RxID:   [id number]

## 2010-07-24 NOTE — Letter (Signed)
Summary: External Correspondence/ PROGRESS NOTE [name] [name]  External Correspondence/ PROGRESS NOTE [name] [name]   Imported By: Dorise Hiss 07/18/2009 08:43:49  _____________________________________________________________________  External Attachment:    Type:   Image     Comment:   External Document

## 2010-07-26 NOTE — Progress Notes (Signed)
Summary: ?? about Pradaxa  Phone Note Call from Patient   Summary of Call: Has approximately 1 week left on his Lovenox.  Questions if Pradaxa would be an option for him.  Not due for f/u in office till February.   Hoover Brunette, LPN  July 13, 2010 5:00 PM   Follow-up for Phone Call        dabigatran is no option for the patient because is not approved for deep venous thrombosis.  Only approved for atrial fibrillation  Follow-up by: Lewayne Bunting, MD, [facility],  July 16, 2010 10:45 AM  Additional Follow-up for Phone Call Additional follow up Details #1::        Wife states that he is out and can not afford the Lovenox.  Advised her that Lovenox is now available in generic (Enoxaparin) and may be affordable with his Medicare.  Rx sent to pharm.  Advised to call back if unable to get this prescription.   Additional Follow-up by: Hoover Brunette, LPN,  July 17, 2010 4:03 PM    Prescriptions: LOVENOX 120 MG/0.8ML SOLN (ENOXAPARIN SODIUM) inject sub q two times a day  #60 x 3   Entered by:   Hoover Brunette, LPN   Authorized by:   Lewayne Bunting, MD, [facility]   Signed by:   Hoover Brunette, LPN on 16/03/9603   Method used:   [facility] to        [facility]  [address] [id number]ail[facility]       [address]       [place], [place]  [address]       Ph: [phone] or [phone]       Fax: [phone]   RxID:   [id number]

## 2010-08-16 ENCOUNTER — Telehealth (INDEPENDENT_AMBULATORY_CARE_PROVIDER_SITE_OTHER): Payer: Self-pay | Admitting: *Deleted

## 2010-08-21 NOTE — Progress Notes (Signed)
Summary: SOB  Phone Note Call from Patient Call back at Home Phone [phone]   Caller: Patient Reason for Call: Talk to Nurse Summary of Call: SOB since this past weekend.  Thought was a cold but is now experiencing pressure gave call to [facility] Initial call taken by: Claudette Laws,  August 16, 2010 8:14 AM  Follow-up for Phone Call        Pt states SOB started last weekend. He states he went out to get paper this am and was SOB with tightness in chest. Pt sounds SOB during call.  Discussed with Dr. Earnestine Leys. Pt needs to go to ER for evaluation. Pt notified and verbalized understanding.  Follow-up by: Cyril Loosen, RN, BSN,  August 16, 2010 8:12 AM

## 2010-08-22 DIAGNOSIS — R0602 Shortness of breath: Secondary | ICD-10-CM

## 2010-09-28 LAB — GLUCOSE, CAPILLARY
Glucose-Capillary: 104 mg/dL — ABNORMAL HIGH (ref 70–99)
Glucose-Capillary: 123 mg/dL — ABNORMAL HIGH (ref 70–99)

## 2010-09-28 LAB — PROTIME-INR
INR: 3.1 — ABNORMAL HIGH (ref 0.00–1.49)
Prothrombin Time: 31.3 seconds — ABNORMAL HIGH (ref 11.6–15.2)

## 2010-09-29 LAB — COMPREHENSIVE METABOLIC PANEL
ALT: 33 U/L (ref 0–53)
Albumin: 3.6 g/dL (ref 3.5–5.2)
Alkaline Phosphatase: 125 U/L — ABNORMAL HIGH (ref 39–117)
BUN: 12 mg/dL (ref 6–23)
GFR calc Af Amer: >60 mL/min (ref >60)
Potassium: 3.9 mEq/L (ref 3.5–5.1)
Sodium: 137 mEq/L (ref 135–145)
Total Protein: 7.6 g/dL (ref 6.0–8.3)

## 2010-09-29 LAB — CBC
Platelets: 259 10*3/uL (ref 150–400)
RDW: 17.2 % — ABNORMAL HIGH (ref 11.5–15.5)

## 2010-09-29 LAB — GLUCOSE, CAPILLARY
Glucose-Capillary: 74 mg/dL (ref 70–99)
Glucose-Capillary: 91 mg/dL (ref 70–99)
Glucose-Capillary: 94 mg/dL (ref 70–99)

## 2010-09-29 LAB — PROTIME-INR: INR: 4.2 — ABNORMAL HIGH (ref 0.00–1.49)

## 2010-10-25 ENCOUNTER — Other Ambulatory Visit: Payer: Self-pay | Admitting: *Deleted

## 2010-10-25 DIAGNOSIS — E785 Hyperlipidemia, unspecified: Secondary | ICD-10-CM

## 2010-10-25 MED ORDER — SIMVASTATIN 40 MG PO TABS: 40.0000 mg | ORAL_TABLET | Freq: Every evening | ORAL | Status: DC

## 2010-10-30 ENCOUNTER — Other Ambulatory Visit: Payer: Self-pay | Admitting: *Deleted

## 2010-10-30 DIAGNOSIS — D6859 Other primary thrombophilia: Secondary | ICD-10-CM

## 2010-10-30 DIAGNOSIS — I82409 Acute embolism and thrombosis of unspecified deep veins of unspecified lower extremity: Secondary | ICD-10-CM

## 2010-10-30 MED ORDER — FONDAPARINUX SODIUM 10 MG/0.8ML ~~LOC~~ SOLN: 10.0000 mg | Freq: Every day | SUBCUTANEOUS | Status: DC

## 2010-10-30 NOTE — Telephone Encounter (Signed)
Error  --agh  

## 2010-11-01 ENCOUNTER — Other Ambulatory Visit: Payer: Self-pay | Admitting: *Deleted

## 2010-11-01 DIAGNOSIS — I82409 Acute embolism and thrombosis of unspecified deep veins of unspecified lower extremity: Secondary | ICD-10-CM

## 2010-11-01 DIAGNOSIS — D6859 Other primary thrombophilia: Secondary | ICD-10-CM

## 2010-11-01 MED ORDER — FONDAPARINUX SODIUM 10 MG/0.8ML ~~LOC~~ SOLN: 10.0000 mg | Freq: Every day | SUBCUTANEOUS | Status: DC

## 2010-11-06 NOTE — Op Note (Signed)
NAME:  [name] [name], [name] [name] NO.:  [id number]   MEDICAL RECORD NO.:  [id number]          PATIENT TYPE:  OIB   LOCATION:  5151                         FACILITY:  [facility]   PHYSICIAN:  Leonie Man, M.D.   DATE OF BIRTH:  Sep 09, 1950   DATE OF PROCEDURE:  02/19/2008  DATE OF DISCHARGE:                               OPERATIVE REPORT   PREOPERATIVE DIAGNOSIS:  Multinodular goiter with compression symptoms.   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   PROCEDURE:  Total thyroidectomy.   SURGEON:  Leonie Man, MD   ASSISTANT:  Troy Sine. Dwain Sarna, MD   ANESTHESIA:  General.   INDICATIONS:  The patient is a 60 year old gentleman with symptoms of  dysphagia and odynophagia as well as dysphonia complaining primarily of  pressure symptoms within his neck and difficulty breathing.  The patient  also has a history of sleep apnea and sleeps in a recliner.  The patient  underwent thyroid workup which shows a large retrosternal multinodular  goiter.  He is referred now for total thyroidectomy in an attempt to  relieve his compression symptoms.   PROCEDURE:  The patient is positioned supinely following the induction  of satisfactory general endotracheal anesthesia and his head is  hyperextended so as to reveal his neck.  The neck is rather  foreshortened due to his large body habitus.  The neck is then prepped  and draped to be included in a sterile operative field.  Preoperative  precautionary measures were carried out as per our routine and a  transverse collar incision is carried down approximately 2  fingerbreadths above the sternal notch extending from the anterior  border of the right to the anterior border of the left  sternocleidomastoid muscle.  Dissection was carried down through the  platysma and myocutaneous flaps are raised up to the thyroid cartilage  and down to the sternal notch.  The strap muscles are  divided in the  midline and dissection carried over the surface of the thyroid into the  sulcus of the right neck.  The retrosternal portion of his thyroid was  dissected free and was elevated out of the mediastinum.  Dissection was  then carried on up to the superior pole, where the superior pole vessels  were isolated and secured with ties of 2-0 silk, medium clips, and  transected with a harmonic scalpel.  The gland was then dissected free  carefully staying close onto the capsule of the gland and dissecting  down both parathyroid and recurrent laryngeal nerves pushing them  posteriorly as the thyroid was dissected off the trachea carrying the  dissection medially.  The inferior pole vessels were taken between clips  and the thyroid and the vessels were transected using the harmonic  scalpel.  The dissection was carried across the trachea and across the  midline.  The isthmus of the thyroid was elevated off the trachea.   Attention was then turned to the left side where similarly the strap  muscles were dissected free from off the thyroid gland, carrying the  dissection laterally into  the sulcus of the thyroid.  The retrosternal  portion of the left lobe was dissected free and with hemostasis being  secured with clips and the harmonic scalpel.  Dissection was then  carried up to the upper pole where the upper pole vessels were secured  with clips and transected with this harmonic scalpel.  Dissection was  then carried out staying close to the capsule of the thyroid and  dissecting the thyroid off the surrounding soft tissues, dissecting the  recurrent laryngeal nerve and parathyroid glands posteriorly and  laterally down to the trachea where the thyroid gland was dissected free  from the trachea and removed.  There was very small pyramidal lobe which  was transected with a harmonic scalpel.  The entire thyroid gland was  then removed and forwarded for pathologic evaluation.  I marked  the  superior right pole with a suture for further identification.  All the  areas of dissection within the neck were then inspected and additional  bleeding points treated with baby clips or with electrocautery.  Then  the neck was thoroughly irrigated.  Sponge, instrument, and sharp counts  were verified in both the right and left neck and the areas of  dissection covered with Surgicel gauze.  The midline strap muscles were  closed with interrupted 3-0 Vicryl sutures.  The flaps were then  irrigated and checked for hemostasis and noted to be dry.  The platysma  muscle and subcutaneous tissues were closed with interrupted 3-0 Vicryl  sutures.  Skin was closed with running 5-0 Monocryl suture and  reinforced with Dermabond and Steri-Strips.  Sterile dressings were  applied.  The anesthetic reversed and the patient removed from the  operating room to the recovery room in stable condition.  He tolerated  the procedure well.      Leonie Man, M.D.  Electronically Signed     PB/MEDQ  D:  02/19/2008  T:  02/20/2008  Job:  [id number]   cc:   Dorisann Frames, M.D.

## 2010-11-06 NOTE — Assessment & Plan Note (Signed)
[facility]                          [facility] OFFICE NOTE   [name], [name]                       MRN:          [id number]  DATE:07/28/2008                            DOB:          01-Dec-1950    PRIMARY CARDIOLOGIST:  Dr. Lewayne Bunting   REASON FOR VISIT:  Annual followup.   [name] [name] returns to our clinic, after last seen here in January of last  year.  He is followed for history of nonobstructive CAD, normal left  ventricular function, but also recurrent left lower extremity DVT with  postphlebitic syndrome.  More recently, he was referred to an oncologist  at [facility], Dr. Isaiah [name], for further recommendations.  [name] [name] was  advised to discontinue Coumadin, apparently due to difficulties with  adequate anticoagulation in the past, and to start low molecular weight  heparin (Arixtra), with which he is reporting compliance.  He is also  considering surgical resection of the left greater saphenous vein,  following an evaluation, again at [facility], given his chronic pain.  He has not yet decided as to whether or not to proceed, however.   From a cardiac standpoint, [name] [name] does report some occasional chest  pain which seems to have developed since this past November.  Of note,  however, these symptoms are not strictly correlated with exertion.  He  has used nitroglycerin on 2 separate occasions, with prompt relief.  These symptoms appear to be unpredictable, although he does suggest some  exertional component, as well.   His last catheterization in July 2008 revealed nonobstructive CAD with,  at most, 40% multiple lesions in the first diagonal branch.  Left  ventricular function was normal.   Patient was also recently started on Coreg, by Dr. Linna [name], for treatment  of hypertension.  He is currently not on a cholesterol-lowering agent.   CURRENT MEDICATIONS:  1. Arixtra injection 10 mg q.h.s.  2. Celebrex 200 b.i.d.  3.  Levothyroxine 0.175 mg daily.  4. Prozac 40 daily.  5. Carvedilol 6.25 b.i.d.  6. Hydrochlorothiazide 25 daily.  7. Prilosec.   PHYSICAL EXAMINATION:  Blood pressure 138/95, pulse 89, regular, weight  311 (up 3).  EKG in our office today indicates NSR at 81 bpm with normal axis and  nonspecific ST changes.  GENERAL:  A 60 year old male, obese, sitting upright, in no distress.  HEENT:  Normocephalic, atraumatic.  NECK:  Palpable carotid pulses.  No bruits.  LUNGS:  Clear to auscultation in all fields.  HEART:  Regular rate and rhythm.  No significant murmurs.  Positive S4.  ABDOMEN:  Protuberant, nontender.  EXTREMITIES:  Bilateral 1+ lower extremity edema with prominent varicose  veins, (left greater than right), and moderate tenderness with palpation  of the left lower extremity.  NEURO:  No focal deficits.   IMPRESSION:  1. Postphebitic syndrome.      a.     Secondary to recurrent left lower extremity deep vein       thrombosis, with chronic thrombosis of distal left thigh greater       saphenous vein, November 2009.  2. Chronic anticoagulation.      a.     Previously on Coumadin, currently on low molecular weight       heparin.  3. Chest pain.      a.     History of nonobstructive coronary artery disease, July       2008.  4. History of normal left ventricular function.  5. Hypertension, recently diagnosed.  6. Obstructive sleep apnea.  7. Morbid obesity.  8. Remote tobacco.   PLAN:  1. Dobutamine stress echocardiogram for risk stratification.  If this      is negative, then no further workup is indicated.  If this is      abnormal, however, then we will need to have the patient return to      the clinic sooner, to discuss a repeat cardiac catheterization.  2. Increase Coreg to 12.5 mg b.i.d., for more aggressive blood      pressure control.  3. Initiate aspirin treatment at 81 mg daily, for primary prophylaxis.  4. Assess lipid status with a fasting lipid profile.   5. Schedule return clinic followup with myself and Dr. Andee [name] in 3      months.      [name] Serpe, PA-C  Electronically Signed      [name] Codding, MD,FACC  Electronically Signed   GS/MedQ  DD: 07/28/2008  DT: 07/29/2008  Job #: [id number]   cc:   [name] Downer, MD

## 2010-11-06 NOTE — Assessment & Plan Note (Signed)
[name] [name], [name] [name]                          [facility] NOTE   [name] [name], [name] [name]                       MRN:          [name] [name]  DATE:02/10/2007                            DOB:          [name] [name]    [name] CARDIOLOGIST:  [name] [name].   [name] FOR VISIT:  [name] cardiac catheterization.   [name] OF PRESENT ILLNESS:  Today I am seeing [name] [name] for [name] [name].  He was evaluated recently on the 24th of July with [name] of chest pain  and shortness of breath. He had a previously documented [name] of deep  venous thrombosis and also an abnormal Cardiolite with fixed defects. He  was referred by [name] [name] for a right and left heart catheterization  which was performed by [name] [name] on the 29th of July. This study  revealed no significant obstructive coronary artery disease with only  mild plaquing in the first diagonal branch. Ejection fraction was 65%  without significant transaortic gradient. Right heart pressures were  also found to be normal with a PA systolic pressure of 22. The fixed  cardiac index was 2.7 and the left ventricular diastolic pressure was 8.  I reviewed all these results with the patient today and reassured him in  this regard.   As far as symptoms he reports a number of continued issues including  literally a feeling of fatigue with activity, also shortness of breath,  and frustration with his symptoms. He has chronic lower extremity edema  and does use compression stockings. He also has problems with  superficial varicosities which are not painful but noticeable to him in  the mid upper thighs. He states that when he stands for a prolonged  period of time or even sits for a long period of time, when he tries to  start moving again his legs are very uncomfortable and hurt posteriorly  behind the thighs. He also has pain in his legs when he ambulates. He  says he is aware that he is overweight and has been trying to  work on  this, although it seems that his weight is going in the wrong direction.   ALLERGIES:  No known drug allergies.   PRESENT MEDICATIONS:  1. Prozac 20 mg p.o. daily.  2. Norvasc 10 mg p.o. daily.  3. Avapro 300 mg p.o. daily.  4. Prilosec 20 mg p.o. daily.  5. Coumadin as directed by the Coumadin clinic.  6. Hydrochlorothiazide 25 mg p.o. daily.  7. Xanax 0.5 mg p.r.n.  8. Nitroglycerine 0.4 mg p.r.n.   REVIEW OF SYSTEMS:  As described in [name] of present illness. He has  had no bleeding problems.   PHYSICAL EXAMINATION:  Oxygen saturation is 97% on room air, heart rate  70s, weight 310 pounds, blood pressure 158/102. This is a morbidly obese  male, no acute distress without active chest pain.  HEENT: Normal.  NECK: Supple. No elevated jugular venous pressure. No loud bruits. No  thyromegaly.  LUNGS: Clear with diminished breath sounds.  CARDIAC EXAM: Reveals distant regular heart sounds. No S3 gallop.  ABDOMEN: Without bruits. Bowel  sounds present.  EXTREMITIES: Exhibit chronic appearing mild edema predominantly around  the ankles bilaterally. No gross asymmetry noted. No palpable cords.  There are spider veins and superficial varicosities noted predominantly  on the medial aspect of the thighs. Distal pulses are 1 + and difficult  to palpate.  SKIN: Otherwise warm and dry.  MUSCULOSKELETAL: No kyphosis noted.  NEURO/PSYCHIATRIC: Patient alert and oriented x3. Affect is somewhat  flat.   IMPRESSION/RECOMMENDATIONS:  1. Symptoms of fatigue, dyspnea on exertion, and leg discomfort as      outlined above. Recent cardiac evaluation was actually fairly      reassuring showing no major obstructive coronary artery disease,      overall normal left ventricular ejection fraction, and normal      pulmonary artery systolic pressures. This would argue against      ischemia mediated symptoms and also chronic thromboembolic disease.      It is not entirely clear to me the  etiology of the patient's      symptoms. I do suspect that his weight is at least partially to      blame and we did discuss this. Given his prior [name] of deep      venous thrombosis I think it is reasonable given his leg pain and      weakness to proceed with lower extremity arterial and venous      studies to exclude any obvious problems in this regard. In addition      I would also like pulmonary function tests and an ambulatory oxygen      saturation to ensure that he is not desaturating, or has any other      obstructive or restrictive lung defect. I will have him follow up      in the office over the next weeks with [name] [name] to review these      studies and determine if any additional evaluation is necessary. It      may be that some of his lower extremity symptoms could be due to      spinal stenosis given their description, although I would like to      evaluate his arterial and venous system first. Clearly better blood      pressure will be warranted and I suspect he may need an addition      medication to accomplish this. His blood pressure has typically not      been this high long term however.  2. Further plans to follow up.     [name] Sidle, [name] [name]  Electronically Signed    SGM/MedQ  DD: 02/10/2007  DT: 02/10/2007  Job #: [id number]   cc:   Learta Codding, [name] [name],FACC  Erasmo Downer, [name] [name]

## 2010-11-06 NOTE — Assessment & Plan Note (Signed)
[facility]                          [facility] NOTE   [name] [name], [name] [name]                       MRN:          [id number]  DATE:05/26/2007                            DOB:          1951/03/28    HISTORY OF PRESENT ILLNESS:  The patient is a pleasant 60 year old male  with a history of chronic dyspnea.  The patient had an extensive workup  in the past, including a cardiac catheterization which showed no  evidence of significant coronary artery disease.  He also had arterial  and venous Dopplers done, as well as pulmonary function tests that is  reviewed by Rozell Searing, PA-C on March 05, 2007, and were within  normal limits; however, the patient in the interim underwent a lymph  node biopsy (which was benign), and was taken off of Coumadin briefly.  During this period of time, the patient developed a deep venous  thrombosis in the left lower extremity.  He was not bridged with Lovenox  unfortunately.  The patient was then admitted and restarted on IV  heparin and Coumadin.  In the interim, the patient continues to have  significant swelling and pain in the left lower extremity.  Despite  adequate anticoagulation, an INR several days ago was 2.2.  The patient  also has seen Dr. Lebron Conners. Darovsky in the meanwhile and was given a  prescription for Lovenox, in the event his INR drops below 2.   CURRENT MEDICATIONS:  1. Prozac 20 mg p.o. daily.  2. Norvasc 10 mg p.o. daily.  3. Avapro 3 mg p.o. daily.  4. Prilosec 20 mg p.o. daily.  5. Coumadin as directed.  6. Hydrochlorothiazide.  7. Pain medications.   PHYSICAL EXAMINATION:  VITAL SIGNS:  Blood pressure 136/92, heart rate  84.  HEENT/NECK:  Normal carotid upstroke.  No carotid bruits.  LUNGS:  Clear breath sounds.  HEART:  Regular rate and rhythm.  Normal S1 and S2.  No murmurs or  gallops.  ABDOMEN:  Soft.  EXTREMITIES:  No cyanosis or clubbing.  There is severe swelling of the  left lower extremity with tenderness throughout the calf and also up in  the left medial aspect of the thigh.  The right leg is edematous but  there is no tenderness.   Electrocardiogram:  Normal sinus rhythm with no acute ischemic changes.   PROBLEMS:  1. Recurrent deep venous thrombosis.  Rule out hypercoagulable state.  2. Right neck lymph node biopsy, negative.  3. Obstructive sleep apnea.  4. Chronic Coumadin use.  5. Rule out pulmonary emboli.   PLAN:  1. The patient despite adequate Coumadin therapy and a therapeutic      INR, continues to have severe swelling in the left lower extremity.      We will repeat his lower extremity venous Dopplers.  2. I will also order a CT scan, given the patient's chronic dyspnea,      as I am concerned about chronic thromboembolic disease.  3. If the patient has persistent deep venous thrombosis and/or      pulmonary emboli, he may require  an IVC filter.  4. He may also need a referral to the Coagulation Clinic in [place].  5. The patient in the future, if elective surgical procedures to be      done, needs bridging with Lovenox, and cannot be taken off of      Coumadin for any extended period of time.     Learta Codding, MD,FACC  Electronically Signed    GED/MedQ  DD: 05/26/2007  DT: 05/26/2007  Job #: [id number]

## 2010-11-06 NOTE — Cardiovascular Report (Signed)
NAME:  [name] [name], [name] [name]                ACCOUNT NO.:  [id number]   MEDICAL RECORD NO.:  [id number]          PATIENT TYPE:  OIB   LOCATION:  1962                         FACILITY:  [facility]   PHYSICIAN:  Noralyn Pick. 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   The patient's INR was 1.1 at the time of the case.   Standard 7-French venous sheath and a 5-French arterial sheath was  placed.   Right heart catheterization was done due to the patient's dyspnea.  Mean  right atrial pressure was 4, RV pressure was 24/3, PA pressure was  somewhat difficult to get.  We had a hard time floating our Swan out;  however, his PA pressure appeared to be in the 22/10 range.  LV pressure  was 110/8 and aortic pressure was 115/75.  Fick cardiac output was 6.8 L  per minute with a cardiac index of 2.7 L per minute per meters squared.   The patient had a left dominant coronary artery circulation.  The left  main coronary artery was normal.   Circumflex coronary artery was large and left dominant.  It was normal.  There were two OM branches and two posterolateral branches.   The patient had a large intermediate branch which was normal.  First  diagonal branch had 30-40% multiple discrete lesions.   The LAD proper was normal.  There were second and third diagonal  branches which were normal.   The right coronary artery was small and nondominant.  It was normal.  There was a large conus branch which was normal.   RAO ventriculography:  RAO ventriculography was normal.  EF was 65%.  There was no gradient across the aortic valve and no MR.   We did have to use a JL-5 catheter for engagement of the left main.  The  patient had a dilated aortic root.  We did an LAO aortography which  confirmed mild aortic root dilatation with no significant aortic  insufficiency and no dissection or significant aneurysm.   IMPRESSION:  The patient's chest pain and shortness of breath would  appear to be noncardiac in etiology.  He tolerated the procedure well.  He will follow up with Dr. Linna Darner and Dr. Andee Lineman in [place].   He will resume his Coumadin, probably tomorrow.      Noralyn Pick. Eden Emms, MD, [facility]  Electronically Signed     PCN/MEDQ  D:  01/20/2007  T:  01/20/2007  Job:  [id number]   cc:   Erasmo Downer, MD  Learta Codding, MD,FACC

## 2010-11-06 NOTE — Assessment & Plan Note (Signed)
[facility]                          [facility] NOTE   [name] [name], [name] [name]                       MRN:          [id number]  DATE:07/06/2007                            DOB:          [birth year]    REFERRING PHYSICIAN:  Erasmo Downer, MD   HISTORY OF PRESENT ILLNESS:  The patient is a 60 year old male with a  history of chronic dyspnea.  He had an extensive workup in the past  including a cardiac catheterization with no evidence of significant  coronary artery disease.  Also pulmonary function test were unrevealing.  The patient has had a history now however of recurrent deep venous  thrombosis, the last episode occurred while he was briefly off Coumadin.  The patient has excessive DVT in the left lower extremity.  He has not  been re-anticoagulated and unfortunately he does appear to have ongoing  pain and some swelling in the left lower extremity likely secondary to  post phlebitic syndrome.  He also has some more proximal pain in the  inner aspect of the thigh without any frank evidence of palpable cord or  swelling or erythema. Otherwise the patient is doing well.  He denies  any chest pain or shortness of breath.  He has no orthopnea, PND, no  palpitations or syncope.   MEDICATIONS:  1. Prozac 20 mg p.o. daily.  2. Norvasc 10 mg p.o. daily.  3. Avapro 300 mg p.o. daily.  4. Prilosec 20 mg p.o. daily.  5. Coumadin as directed.  6. Hydrochlorothiazide 25 mg p.o. daily.  7. Pain medications.   PHYSICAL EXAMINATION:  VITAL SIGNS:  Blood pressure is 130/84, heart  rate is 93, weight is 308 pounds.  NECK:  Normal carotid upstroke and no carotid bruits.  LUNGS:  Clear breath sounds bilaterally.  HEART:  Regular rate and rhythm.  Normal S, -S2. No murmur, rubs or  gallops.  ABDOMEN:  Soft, nontender, no rebound or guarding.  Good bowel sounds.  EXTREMITIES:  Swelling in the left lower extremity without any palpable  cord or erythema.  There is some tenderness in the calf. The remainder of  the lower extremity is described as above.  NEURO:  The patient is alert, oriented, in grossly nonfocal.   PROBLEM LIST:  1. Recurrent deep venous thrombosis, rule out hypercoagulable state.  2. Right neck lymph node biopsy negative.  3. 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      Biopsy recommended. The results forwarded to Dr. Linna Darner.  4. Chronic Coumadin use.  5. Obstructive sleep apnea.  6. 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   PLAN:  1. I suspect the patient has a postphlebitic syndrome in the left      lower extremity; however, I cannot completely rule out that he does      not have thrombus extension on Coumadin therapy.  2. Given the above will send the patient for repeat Doppler studies.      If there is indeed worsening extension, the patient will need to be      considered for  an IVC filter as well as a hypercoagulable workup.      I do not think that is the case however and I suspect it is more      likely a post phlebitic syndrome. I have also asked the patient to      wear a compression stocking on the left lower extremity.  3. From a cardiovascular perspective, the patient is otherwise stable.      He has follow-up scheduled in [place] to evaluate his thyroid      nodules.     Learta Codding, MD,FACC  Electronically Signed    GED/MedQ  DD: 07/06/2007  DT: 07/07/2007  Job #: [id number]   cc:   Erasmo Downer, MD

## 2010-11-06 NOTE — Assessment & Plan Note (Signed)
[facility]                          [facility]   HELAMAN, MECCA                       MRN:          [id number]  DATE:01/15/2007                            DOB:          1950-12-29    HISTORY OF PRESENT ILLNESS:  The patient is a 60 year old male, former  Print production planner of [place], who reports over the last several weeks  increased dyspnea and substernal chest pain.  The patient was formerly  evaluated by Dr. Myrtis Ser in 2003 when he complained of similar symptoms.  At that time a Cardiolite study was done which was essentially negative  as well as an echocardiographic study which was reported within normal  limits.  The patient however has a history of recurrent DVT x2 for which  he uses chronic Coumadin therapy.  Of note is also that he has severe  lower extremity varicosities and the postphlebitic syndrome as a  complication of his prior DVTs.  The latter required surgery with vein  resection by the V[facility] Surgery Service.   The patient was recently hospitalized by Dr. Linna Darner for chest tightness.  He ruled out for myocardial infarction.  Echocardiographic study done at  that time was technically difficult but demonstrated normal LV function  with an ejection fraction of 55%.  PA pressure could not be estimated,  nor were there any comments about right ventricular size and function.  The patient was, per Dr. Linna Darner, set up for an outpatient Cardiolite  study, this was done yesterday.  The patient had relatively poor  exercise tolerance.  He was only able to walk for 3 minutes and 55  seconds.  He did report substernal chest pain during exercise testing  although there were no EKG changes there was a small lateral and basal  inferior defect, both which were nonreversible, as well areas of scar  could not be ruled out.   The patient stated that for the last 2-3 weeks he has had significant  difficulty upon walking with shortness  of breath.  He states just  walking in the yard makes him extremely tired and he gives out.  Activities also associated with chest tightness and some diaphoresis.  The patient also even reports some resting symptoms while at night  watching a ball game he suddenly feels a wave of tightness and  squeezing sensation coming across his chest, this usually lasts a few  minutes and then abates.  The patient was given nitroglycerin by Dr.  Linna Darner but has not used it yet.  The patient is now in the office here to  discuss the study results and to further evaluate his symptoms as  outlined above.   ALLERGIES:  NO KNOWN DRUG ALLERGIES.   MEDICATIONS:  1. Prozac 20 mg p.o. daily.  2. Norvasc 10 mg p.o. daily.  3. __________  p.o. daily.  4. Prilosec 20 mg p.o. daily.  5. Coumadin as directed.  6. Hydrochlorothiazide 25 mg p.o. daily.   SOCIAL HISTORY:  The patient used to chew tobacco, he used to smoke  approximately 20 years ago but he does not  drink.  He is married.  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   FAMILY HISTORY:  Mother died from a heart attack at age 22, father died  from a heart attack at age 74.   REVIEW OF SYSTEMS:  As per HPI.  No nausea or vomiting, no fever or  chills.  No melena, hematochezia, dysuria, frequency, orthopnea or PND.  No palpitations or syncope.  No neurological symptoms.  Lower extremity  swelling and varicosities which are chronic.   PHYSICAL EXAMINATION:  VITAL SIGNS:  Blood pressure is 138/92, heart  rate 71 beats per minute, weight is 306 pounds.  GENERAL:  Overweight white male in no distress.  HEENT:  Normal.  NECK:  Normal carotid upstroke, no carotid bruits.  JVD:  Approximately 7 cm.  LUNGS:  Clear breath sounds bilaterally.  HEART:  Regular rate and rhythm with normal S1, S2 and no murmur, rubs,  or gallops.  ABDOMEN:  Soft and nontender with no rebound or guarding and good bowel  sounds.  EXTREMITY:   Reveals severe varicosities of both lower extremities as  well as multiple palpable cords in the lower extremities but no definite  evidence of inflammation.   EKG normal sinus rhythm with no acute ischemic changes.   PROBLEM LIST:  1. Substernal chest pain, rule out coronary artery disease.  2. Abnormal Cardiolite with fixed defects as outlined above.  3. Cardiac risk factors with strong family history of coronary artery      disease and premature cardiovascular death of his father.  4. Severe varicosities of the lower extremities.  5. History of deep venous thrombosis x2.  6. History of postphlebitic syndrome with vein resection.  7. Rule out pulmonary hypertension secondary to deep venous      thrombosis/pulmonary embolism.   PLAN:  1. The patient's symptoms of chest tightness, shortness of breath are      very concerning.  I suspect a differential diagnosis either      ischemic heart disease or pulmonary hypertension from chronic      thromboembolic disease.  2. We will proceed with a left and right heart catheterization to rule      out coronary artery disease as well as evaluate the patient's PA      pressures.  If the patient has no coronary artery disease but      significant pulmonary hypertension then he will need a CT scan to      rule out chronic thromboembolic disease and consideration may need      to be given to additional DVT/PE prophylaxis.  3. The patient was given nitroglycerin by Dr. Linna Darner and I told him to      use this as needed.  4. The patient will be enrolled in the Coumadin clinic and we will set      up his catheterization in      the [facility] lab electively.  We will discontinue Coumadin but we will      bridge him with Lovenox.  This will be arranged through our office.     Learta Codding, MD,FACC  Electronically Signed    GED/MedQ  DD: 01/15/2007  DT: 01/15/2007  Job #: [id number]   cc:   Erasmo Downer, MD

## 2010-11-06 NOTE — Assessment & Plan Note (Signed)
[facility]                          [facility] NOTE   [name] [name], [name] [name]                       MRN:          [id number]  DATE:01/02/2009                            DOB:          [birth year]    HISTORY OF PRESENT ILLNESS:  The patient is a 60 year old male with a  history of recurrent deep vein thrombosis in hypercoagulable state.  The  patient was previously placed by Dr. More on Arixtra (fondaparinux).  However, due to lack of financial resource, the patient was converted  back to Coumadin.  The patient continues to have pain in the left lower  extremity secondary to a postphlebitic syndrome, but clinically has no  evidence of recurrent acute deep vein thrombosis.  The patient also had  significant depression and agoraphobia.  Now, we switched his  medications to Effexor last time.  He is not doing much better.  He  states that his agoraphobia has resolved and is much less depressed.  He  is also able to sleep.   MEDICATIONS:  1. Prilosec 20 mg p.o. daily.  2. Hydrochlorothiazide 25 mg p.o. daily.  3. Effexor 150 mg p.o. daily.  4. Levothyroxine 175 mg p.o. daily.  5. Celebrex 200 mg p.o. b.i.d.  6. Simvastatin 40 mg p.o. at bedtime.  7. Carvedilol 12.5 b.i.d.  8. Coumadin 5 mg as directed.  9. Metformin ER 500 mg p.o. b.i.d.  10.Trazodone 50 mg p.o. at bedtime.  11.Clonazepam 0.5 mg p.o. b.i.d. p.r.n.   PHYSICAL EXAMINATION:  VITAL SIGNS:  Blood pressure is 130/87, heart  rate is 77, and weight 304 pounds.  GENERAL:  Overweight white male but in no apparent distress.  HEENT:  Pupils, eyes are clear.  Conjunctivae clear.  NECK:  Supple.  Normal carotid upstroke.  No carotid bruits.  LUNGS:  Clear breath sounds bilaterally.  HEART:  Regular rate and rhythm with normal S1 and S2.  No murmurs,  rubs, or gallops.  ABDOMEN:  Soft and nontender.  No rebound or guarding.  Good bowel  sounds.  EXTREMITIES:  No cyanosis or clubbing.   There is mild edema.  There are  prominent varicosities.   PROBLEM LIST:  1. Postphlebitic syndrome.      a.     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  2. Chronic anticoagulation.  Fondaparinux discontinued currently and      back on Coumadin.  PT/INR to be checked today.  3. Chest pain, resolved with a history of nonobstructive coronary      artery disease.  4. Depression, improved.  5. Normal left ventricular function.  6. Hypertension, controlled.  7. Obstructive sleep apnea.  8. Obesity.  9. Remote tobacco use.   PLAN:  1. The patient's blood pressure is much better controlled.  He can      continue his current medical regimen.  2. The patient is also prescribed a heating blanket for his  postphlebitic syndrome on the left thigh.  3. The patient can follow up with Dr. Linna Darner regarding his      antidepressant therapy, which seems to be working for him.     Learta Codding, MD,FACC  Electronically Signed    GED/MedQ  DD: 01/02/2009  DT: 01/03/2009  Job #: [id number]   cc:   Erasmo Downer, MD

## 2010-11-06 NOTE — Assessment & Plan Note (Signed)
[name]                          [name] [name]   [name] [name], [name] [name]                       MRN:          [id number]  DATE:11/24/2008                            DOB:          12-11-1950    REFERRING PHYSICIAN:  Erasmo Downer, MD   HISTORY OF PRESENT ILLNESS:  The patient is a 59 year old male with a  history of recurrent deep venous thrombosis and hypercoagulable state.  The patient was more recently placed by Dr. Isaiah [name] on Arixtra  (fondaparinux).  However, due to lack of financial resources, the  patient was switched back to Coumadin.  The patient states that he is  doing well with no evidence of recurrent deep venous thrombosis.  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  The patient does also report significant problems with depression.  He  also has agoraphobia and is afraid to come in to be in large crowds.  The patient feels dawn and blue quite a bit partly because of his  underlying medical condition.  His blood pressure also poorly controlled  with a diastolic of 90 mmHg.  He is in NYHA class II, but denies any  substernal chest pain.   MEDICATIONS:  1. Prilosec 20 mg p.o. daily.  2. Hydrochlorothiazide 25 mg p.o. daily.  3. Prozac 40 mg p.o. daily.  4. Levothyroxine 175 mcg p.o. daily.  5. Celebrex 200 mg p.o. b.i.d.  6. Carvedilol 12.5 mg p.o. b.i.d.  7. Warfarin 50 mg daily.  8. Metformin 500 mg p.o. b.i.d.   PHYSICAL EXAMINATION:  VITAL SIGNS:  Blood pressure is 133/90, heart  rate is 73, weight is 308 pounds.  GENERAL:  Obese white male, but in no apparent distress.  HEENT:  Pupils, eyes are equal.  Conjunctivae clear.  NECK:  Supple.  Normal carotid upstroke and no carotid bruits.  LUNGS:  Clear breath sounds bilaterally.  HEART:  Regular rate and rhythm with normal S1 and S2.  No murmur, rubs,  or gallops.  ABDOMEN:  Soft and nontender.  No rebound or guarding.  Good bowel  sounds.  EXTREMITIES:  No cyanosis or clubbing.  There is swelling below and  bilateral lower extremities secondary to postphlebitic syndrome.   PROBLEM LIST:  1. Postphlebitic syndrome.      a.     Recurrent left lower extremity deep venous thrombosis with       chronic thrombose of distal left thigh, great saphenous vein in       November 2009.  2. Chronic anticoagulation, fondaparinux discontinued.  Currently,      back on Coumadin.  3. Chest pain, resolved.      a.     History of nonobstructive coronary artery disease.  4. Depression.  5. Normal left ventricular function.  6. Hypertension.  7. Obstructive sleep apnea.  8. Obesity.  9. Remote tobacco use.   PLAN:  1. The patient's blood pressure is poorly controlled and we will give      him lisinopril 10 mg p.o. daily.  In addition, we will also switch  his Prozac to Effexor as he has significant signs of depression.      The patient will be switched to Effexor as I said and this will be      done in the following scheme due to both with a strong serotonergic      properties of venlafaxine and duloxetine.  In week 1, the patient      will take 20 mg daily x1 week of the Prozac and 75 mg of      venlafaxine and Prozac will be discontinued.  In week 2, the      venlafaxine will be increased to 150 mg p.o. daily.  2. Laboratory work was reviewed.  The patient's cholesterol panel and      lipid panel is within normal limits.  His INR is 2.3.  I also gave      the patient prescription for trazodone 50 mg at bedtime and      clonazepam 0.5 mg twice a day.     [name] Codding, MD,FACC  Electronically Signed    [name] [name]  DD: 11/28/2008  DT: 11/29/2008  Job #: [id number]   cc:   [name] Downer, MD

## 2010-11-06 NOTE — Discharge Summary (Signed)
[name] [name], RAIA NO.:  [id number]   MEDICAL RECORD NO.:  [id number]          PATIENT TYPE:  INP   LOCATION:  2039                         FACILITY:  [facility]   PHYSICIAN:  Hillis Range, MD       DATE OF BIRTH:  1951-02-25   DATE OF ADMISSION:  02/21/2009  DATE OF DISCHARGE:  02/23/2009                               DISCHARGE SUMMARY   This patient has no known drug allergies.  The time for this dictation  greater than 40 minutes.   FINAL DIAGNOSES:  1. Discharging day 1, status post implant of an inferior vena cava      filter.  2. History of deep venous thrombosis with recurrences.      a.     This patient has a hypercoagulable state.      b.     Chronic Coumadin, supratherapeutic on admission, February 21, 2009, at 4.2.   SECONDARY DIAGNOSES:  1. Nonobstructive coronary artery disease.      a.     Catheterization in July 2008 only a finding of 30-40%       discrete lesions in the diagonal from the ramus intermediate.      b.     Ejection fraction 65%.  2. Hypertension.  3. Iatrogenic hypothyroidism.      a.     Total thyroidectomy in August 2009.  4. Depression, acarophobia.  5. Obesity.  6. Obstructive sleep apnea.  The patient uses continuous positive      airway pressure.  7. Postphlebetic pain syndrome, left thigh.   PROCEDURES THIS ADMISSION:  On September 1, implant of IVC filter with  ultrasound guidance.  Left renal venogram, Dr. Bonnielee Haff.   BRIEF HISTORY:  Mr. Farrel is a 60 year old male.  He has a history of  recurrent deep venous thrombosis in the setting of a hypercoagulable  state.  The patient had previously been on fondaparinux.  This was  discontinued secondary to financial concerns.  The patient is now on  Coumadin.   The patient has pain in the left lower extremities secondary to  postphlebitic syndrome.  He has no evidence of recurrent acute deep  venous thrombosis.  There is concern that the patient may have another  recurrence and he has been sent to [facility] for placement of  an IVC filter.   HOSPITAL COURSE:  The patient was admitted on August 31.  He was then  consulted by Interventional Radiology who agrees that the patient would  be appropriate for IVC filter placement.  The patient's INR is  supratherapeutic.  His Coumadin was held.  On the day of the implant,  his INR was 3.1.  He received 5 mg of Coumadin on September 1.  His INR  on the day of discharge is 2.3.  He will continue only on 10 mg daily.  He had been on 10 mg daily but with a bump to 15 mg on Thursday and  Sunday, now he will be on 10 mg daily.  Also, his blood pressure is a  little low and we were asking to hold his hydrochlorothiazide over the  weekend from September 2 to September 5.  Otherwise, the patient  continues on his medications with the exception that he will be on  Keflex 500 mg 1 tablet 4 times daily to be taken one-half hour before  breakfast, lunch, and dinner bedtime for next 7 days since he does have  a marked hematoma on the right tibial prominence.  He will also go home  with pain medication consisting of Percocet tablets and oxycodone 5 mg  every 6 hours as needed.  His medication listing is as follows:  1. Keflex 500 mg 1 tablet 1/2 hour before breakfast, lunch, dinner at      bedtime for the next 7 days, this is a new medication.  2. Tylenol 325 mg 1 tablet every 6 hours as needed.  3. New medication, oxycodone 5 mg 1 tablet every 6 hours as needed.  4. Alprazolam 0.5 mg 1 tablet as needed.  5. Carvedilol 12.5 mg twice daily.  6. Celebrex 200 mg 1 tablet twice a day as needed.  7. Clonazepam 0.5 mg 1 tablet twice a day as needed.  8. Coumadin 10 mg, take this tab daily for now.  9. Effexor 150 mg daily.  10.Hydrochlorothiazide 25 mg.  He is to hold off on taking his      medication Thursday through Sunday, September 2 to September 5.  11.Levothyroxine 175 mcg daily.  12.Lisinopril 20 mg  daily.  13.Metformin 500 mg twice daily.  14.Nitroglycerin 0.4 mg sublingually 1 tablet under the tongue every 5      minutes x3 doses as needed for chest pain.  15.Prilosec 20 mg daily.  16.Darvocet-N 100 daily as needed.  17.Simvastatin 40 mg daily at bedtime.  18.Trazodone 50 mg daily at bedtime as needed.   LABORATORY STUDIES THIS ADMISSION:  Once again, the protime at discharge  24.7 and INR 2.3.  TSH was 1.630.  Metabolic panel:  Sodium 137,  potassium 3.9, chloride 99, carbonate 30, glucose 122, BUN is 12,  creatinine 0.9, alkaline phosphatase 125, SGOT is 29, and SGPT is 33.  Complete blood count:  White cell is 8.5, hemoglobin 11.8, hematocrit  35.5, and platelets are 259.      Maple Mirza, [place]      Hillis Range, MD  Electronically Signed    GM/MEDQ  D:  02/23/2009  T:  02/24/2009  Job:  [id number]   cc:   [facility]

## 2010-11-06 NOTE — Assessment & Plan Note (Signed)
[facility]                          [facility] OFFICE NOTE   [name] [name], [name] [name]                       MRN:          [id number]  DATE:03/05/2007                            DOB:          1950/09/13    PRIMARY CARDIOLOGIST:  Learta Codding, M.D., [facility]   REASON FOR VISIT:  Scheduled clinic followup.   Please refer to Dr. Doreatha Martin McDowell's recent clinic note of February 10, 2007  for full details.   At that time, the patient presented for post-cardiac catheterization  followup after being recently referred by Dr. Andee Lineman for further  evaluation of symptoms worrisome for ischemic heart disease; however,  diagnostic right/left cardiac catheterization suggested no significant  CAD, normal left ventricular function, and normal right-sided heart  pressures as well.  This was also reviewed by Dr. Diona Browner with the  patient during his most recent visit.   However, given that the patient continued to report persistent fatigue,  exertional dyspnea, as well as lower extremity discomfort, additional  extensive noninvasive workup was suggested by Dr. Diona Browner.  These  consisted of lower extremity arterial Dopplers, venous Dopplers, as well  as pulmonary function tests.  I reviewed all of these results with the  patient, pointing out that they were all within normal limits.   Of note, the patient also had a 6-minute walk test showing adequate  oxygenation (97%) post-exercise.   The patient tells me today that there has essentially been no change in  his pattern from baseline since his last office visit.  He continues to  experience exertional dyspnea with moderate exertion but denies PND,  orthopnea, or worsening lower extremity edema.  With respect to his  daytime fatigue, he points out that he already has been diagnosed with  sleep apnea, but he is compliant with CPAP and that this clearly has  benefited him.   The patient also had recent blood work  consisting of a BMET which shows  normal electrolytes and renal function.  A TSH on February 16, 2007 was  0.89.  His last CBC was on January 19, 2007 which was normal.   CURRENT MEDICATIONS:  1. Norvasc 10 daily.  2. Avapro 300 daily.  3. Prilosec 20 daily.  4. Coumadin as directed.  5. Hydrochlorothiazide 25 daily.  6. Prozac 20 daily.   PHYSICAL EXAMINATION:  VITAL SIGNS:  Blood pressure 132/81, pulse 96 and  regular.  Weight 305.8 (down 5 pounds).  GENERAL:  A 60 year old male, morbidly obese, sitting upright in no  distress.  HEENT:  Normocephalic, atraumatic.  NECK:  Palpable bilateral carotid pulses without bruits.  Unable to  assess JVD secondary to neck girth.  LUNGS:  Clear to auscultation in all fields.  HEART:  Regular rate and rhythm (S1 and S2), soft S4.  No significant  murmurs.  No rubs.  ABDOMEN:  Protuberant, benign.  EXTREMITIES:  Minimally palpable peripheral pulses with chronic  bilateral venous varicosities, as previously outlined.  NEUROLOGIC:  Flat affect.  Without focal deficit.   IMPRESSION:  1. Persistent fatigue.      a.  Suspect noncardiac etiology, given recent extensive negative       workup.  2. Right neck lymph node.  3. Obstructive sleep apnea.      a.     Compliant on continuous positive airway pressure.  4. Morbid obesity.  5. Chronic Coumadin.      a.     History of recurrent deep venous thrombosis.   PLAN:  1. Following review of the patient's most recent extensive diagnostic      studies with Dr. Andee Lineman, recommendation is to proceed with an      additional test consisting of a cardiopulmonary function test for      assessment of deconditioning status.  The patient is agreeable to      proceed with this additional study, and we will arrange to have      this done in the near future in [place].  2. Refer the patient to Dr. Truman Hayward of ENT for further      evaluation of the right neck lymph node.  3. Continue current medication  regimen.  4. Schedule return clinic followup with myself and Dr. Andee Lineman in 3      months.      Gene Serpe, PA-C  Electronically Signed      Learta Codding, MD,FACC  Electronically Signed   GS/MedQ  DD: 03/05/2007  DT: 03/06/2007  Job #: [id number]   cc:   Erasmo Downer, MD

## 2010-11-12 ENCOUNTER — Other Ambulatory Visit: Payer: Self-pay | Admitting: *Deleted

## 2010-11-12 DIAGNOSIS — I82409 Acute embolism and thrombosis of unspecified deep veins of unspecified lower extremity: Secondary | ICD-10-CM

## 2010-11-12 DIAGNOSIS — D6859 Other primary thrombophilia: Secondary | ICD-10-CM

## 2010-11-12 MED ORDER — FONDAPARINUX SODIUM 10 MG/0.8ML ~~LOC~~ SOLN: 10.0000 mg | Freq: Every day | SUBCUTANEOUS | Status: DC

## 2010-11-14 ENCOUNTER — Encounter: Payer: Self-pay | Admitting: *Deleted

## 2010-11-15 ENCOUNTER — Ambulatory Visit (INDEPENDENT_AMBULATORY_CARE_PROVIDER_SITE_OTHER): Payer: Medicare Other | Admitting: Cardiology

## 2010-11-15 ENCOUNTER — Encounter: Payer: Self-pay | Admitting: Cardiology

## 2010-11-15 DIAGNOSIS — R0600 Dyspnea, unspecified: Secondary | ICD-10-CM | POA: Insufficient documentation

## 2010-11-15 DIAGNOSIS — R0602 Shortness of breath: Secondary | ICD-10-CM

## 2010-11-15 DIAGNOSIS — I38 Endocarditis, valve unspecified: Secondary | ICD-10-CM

## 2010-11-15 DIAGNOSIS — R079 Chest pain, unspecified: Secondary | ICD-10-CM

## 2010-11-15 MED ORDER — NITROGLYCERIN 0.4 MG SL SUBL: 0.4000 mg | SUBLINGUAL_TABLET | SUBLINGUAL | Status: DC | PRN

## 2010-11-15 MED ORDER — TORSEMIDE 20 MG PO TABS: ORAL_TABLET | ORAL | Status: DC

## 2010-11-15 NOTE — Progress Notes (Signed)
HPI The patient has a previous history of bacteremia and endocarditis. He was managed in 2011 with a long course of antibiotics. A TEE done in June of that year demonstrated significant left atrial enlargement, mitral valve prolapse but only mild mitral regurgitation. He now presents with increasing dyspnea that has been going on for several months. In the spring of this year he did have diuretics increased and improved with weight loss after this. However, he now presents with increasing dyspnea and a 7 pound weight gain over about 2-1/2 weeks. He did have an echocardiogram transthoracic demonstrated mild to moderate mitral regurgitation but preserved ejection fraction. He again had left atrial enlargement. He does get some chest discomfort or soreness when he is dyspneic. He will be short of breath walking 25 yards on level ground. He tries to avoid salt but is not on a significantly restricted salt diet. Is not having any PND or orthopnea. He will have a rapid heart rate when he exercises but not at rest. He has had no presyncope or syncope.  No Known Allergies  Current Outpatient Prescriptions  Medication Sig Dispense Refill  . chlorthalidone (HYGROTON) 25 MG tablet Take 25 mg by mouth daily.        . clonazePAM (KLONOPIN) 0.5 MG tablet Take 0.5 mg by mouth daily.        . cyclobenzaprine (FLEXERIL) 10 MG tablet Take 10 mg by mouth 3 (three) times daily as needed.        . DULoxetine (CYMBALTA) 60 MG capsule Take 60 mg by mouth daily.        . fondaparinux (ARIXTRA) 10 MG/0.8ML SOLN Inject 0.8 mLs (10 mg total) into the skin at bedtime.  15 Syringe  0  . furosemide (LASIX) 40 MG tablet Take 40 mg by mouth 2 (two) times daily.        . isosorbide mononitrate (IMDUR) 30 MG 24 hr tablet Take 1 tablet by mouth Daily.      Marland Kitchen labetalol (NORMODYNE) 100 MG tablet Take 100 mg by mouth 2 (two) times daily.        Marland Kitchen levothyroxine (SYNTHROID, LEVOTHROID) 200 MCG tablet Take 200 mcg by mouth daily.        Marland Kitchen  lisinopril (PRINIVIL,ZESTRIL) 20 MG tablet Take 20 mg by mouth daily.        . metFORMIN (GLUCOPHAGE) 500 MG tablet Take 500 mg by mouth 2 (two) times daily with a meal.        . nitroGLYCERIN (NITROSTAT) 0.4 MG SL tablet Place 0.4 mg under the tongue every 5 (five) minutes as needed.        Marland Kitchen omeprazole (PRILOSEC) 20 MG capsule Take 20 mg by mouth daily.        . simvastatin (ZOCOR) 40 MG tablet Take 1 tablet (40 mg total) by mouth every evening.  90 tablet  0  . spironolactone (ALDACTONE) 25 MG tablet Take 1 tablet by mouth Twice daily.      Marland Kitchen DISCONTD: acetaminophen (TYLENOL) 325 MG tablet Take 650 mg by mouth every 6 (six) hours as needed.        Marland Kitchen DISCONTD: enoxaparin (LOVENOX) 120 MG/0.8ML SOLN Inject 120 mg into the skin every 12 (twelve) hours.        Marland Kitchen DISCONTD: HYDROcodone-acetaminophen (VICODIN) 5-500 MG per tablet Take 1 tablet by mouth every 6 (six) hours as needed.        Marland Kitchen DISCONTD: labetalol (NORMODYNE) 200 MG tablet Take 200 mg by mouth 2 (  two) times daily.         Past Medical History  Diagnosis Date  . Obesity   . Hypertension   . Chest pain   . OSA (obstructive sleep apnea)     CPAP  . Depression   . Postphlebitic syndrome     Secondary to recurrent left lower shin faint ecchymosis w/ chronic thrombosis of lefttthigh greater saphenous  . CAD (coronary artery disease) 2008    Mild coronary plaque.  w/ normal LV function. repeated cardiac cath on 07/2008.  . Endocarditis   . Dyspnea   . DVT (deep vein thrombosis) in pregnancy     IVC    Past Surgical History  Procedure Date  . Thyroidectomy     ROS:  As stated in the HPI and negative for all other systems.   PHYSICAL EXAM BP 116/65  Pulse 83  Ht 5\' 11"  (1.803 m)  Wt 314 lb (142.429 kg)  BMI 43.79 kg/m2  SpO2 98% GENERAL:  Well appearing, obese HEENT:  Pupils equal round and reactive, fundi not visualized, oral mucosa unremarkable NECK:  No jugular venous distention, waveform within normal limits,  carotid upstroke brisk and symmetric, no bruits, no thyromegaly LYMPHATICS:  No cervical, inguinal adenopathy LUNGS:  Clear to auscultation bilaterally BACK:  No CVA tenderness CHEST:  Unremarkable HEART:  PMI not displaced or sustained,S1 and S2 within normal limits, no S3, no S4, no clicks, no rubs, holosystolic anterior murmurs ABD:  Flat, positive bowel sounds normal in frequency in pitch, no bruits, no rebound, no guarding, no midline pulsatile mass, no hepatomegaly, no splenomegaly EXT:  2 plus pulses throughout, no edema, no cyanosis no clubbing, varicose veins SKIN:  No rashes no nodules NEURO:  Cranial nerves II through XII grossly intact, motor grossly intact throughout PSYCH:  Cognitively intact, oriented to person place and time    ASSESSMENT AND PLAN

## 2010-11-15 NOTE — Patient Instructions (Addendum)
   Stop Lasix  Begin Torsemide 40mg  every morning & 20mg  every evening (6 hours after a.m. dose) Your physician recommends that you go to the [facility] for lab work today for [employer] & BNP. You will need to repeat another BMET in 5 days Follow up in  1 week - will call back with appointment

## 2010-11-15 NOTE — Assessment & Plan Note (Signed)
Today I will check a BNP level and a basic metabolic profile. I will discontinue Lasix and start torsemide 40 mg in the morning and 20 mg in the afternoon. He will get another basic metabolic profile in 5 days. We will see him back soon after this. We discussed significant salt restriction. He may need another transesophageal ultrasound to further quantify his valve regurgitation if he remains symptomatic. At this point I do not however suspect recurrent endocarditis or a new valvular problem.

## 2010-11-15 NOTE — Assessment & Plan Note (Signed)
The blood pressure is at target. No change in medications is indicated. We will continue with therapeutic lifestyle changes (TLC).  

## 2010-11-15 NOTE — Assessment & Plan Note (Signed)
The patient understands the need to lose weight with diet and exercise. We have discussed specific strategies for this. I prescribed the [employer].

## 2010-11-16 ENCOUNTER — Ambulatory Visit: Payer: Medicare Other | Admitting: Cardiology

## 2010-11-23 ENCOUNTER — Ambulatory Visit (INDEPENDENT_AMBULATORY_CARE_PROVIDER_SITE_OTHER): Payer: Medicare Other | Admitting: Cardiovascular Disease

## 2010-11-23 ENCOUNTER — Encounter: Payer: Self-pay | Admitting: *Deleted

## 2010-11-23 ENCOUNTER — Encounter: Payer: Self-pay | Admitting: Cardiology

## 2010-11-23 ENCOUNTER — Other Ambulatory Visit: Payer: Self-pay | Admitting: *Deleted

## 2010-11-23 ENCOUNTER — Telehealth: Payer: Self-pay | Admitting: *Deleted

## 2010-11-23 ENCOUNTER — Encounter: Payer: Self-pay | Admitting: Cardiovascular Disease

## 2010-11-23 DIAGNOSIS — R0600 Dyspnea, unspecified: Secondary | ICD-10-CM

## 2010-11-23 DIAGNOSIS — I059 Rheumatic mitral valve disease, unspecified: Secondary | ICD-10-CM

## 2010-11-23 DIAGNOSIS — R079 Chest pain, unspecified: Secondary | ICD-10-CM

## 2010-11-23 DIAGNOSIS — I351 Nonrheumatic aortic (valve) insufficiency: Secondary | ICD-10-CM

## 2010-11-23 HISTORY — DX: Nonrheumatic aortic (valve) insufficiency: I35.1

## 2010-11-23 MED ORDER — LABETALOL HCL 100 MG PO TABS: 100.0000 mg | ORAL_TABLET | Freq: Two times a day (BID) | ORAL | Status: DC

## 2010-11-23 NOTE — Telephone Encounter (Signed)
TEE December 03, 2010 Arrive at 10am for 11am procedure Dr. [facility]

## 2010-11-23 NOTE — Patient Instructions (Signed)
Follow up as scheduled. Your physician has requested that you have a TEE. During a TEE, sound waves are used to create images of your heart. It provides your doctor with information about the size and shape of your heart and how well your heart's chambers and valves are working. In this test, a transducer is attached to the end of a flexible tube that's guided down your throat and into your esophagus (the tube leading from you mouth to your stomach) to get a more detailed image of your heart. You are not awake for the procedure. Please see the instruction sheet given to you today. For further information please visit [email or web address] Your physician recommends that you continue on your current medications as directed. Please refer to the Current Medication list given to you today.

## 2010-11-23 NOTE — Assessment & Plan Note (Addendum)
This is likely multifactorial due to diastolic heart failure, general deconditioning and morbid obesity. It is concerning though it has progressed significantly over the last few months. He responded well to that torsemide in terms of improved urine output. However, his dyspnea did not improve at all. He also did not show significant amount of weight. His BNP was normal. This can be falsely low an obese patient. During his most recent cardiac catheterization in February of 2011 he was noted to have significantly elevated left ventricular end-diastolic pressure. This was likely due to diastolic dysfunction. The other possibility is mitral regurgitation especially with his previous history of endocarditis. His recent transthoracic echocardiogram showed mild to moderate mitral regurgitation. He does have a heart murmur suggestive of mitral regurgitation and I am concerned that the transthoracic echo might be under estimating the severity of this. Thus, I recommend proceeding with a transesophageal echocardiogram for a definitive answer. Will also need to look for signs of pulmonary hypertension. He might ultimately require a right heart catheterization likely through and upper extremity approach given that he has an IVC filter.

## 2010-11-23 NOTE — Progress Notes (Signed)
HPI  Assessment this is a 60 year old gentleman who was seen recently by Dr. Hubbardston Lions. 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 He is on lifelong anticoagulation. In June of 2011, he was diagnosed with the mitral valve infective endocarditis which was treated with a prolonged course of IV antibiotics with no recurrence since then. He was seen recently for increased exertional dyspnea, orthopnea and few episodes of chest tightness. The patient also had increased lower extremity edema. He was switched from furosemide to torsemide. The patient reports that her urine output response. He had his labs checked when which included BNP which was only 83. The patient reports no improvement in his shortness of breath and orthopnea. This has been going on with him now since March of this year.  He does not have history of significant coronary artery disease. He had cardiac catheterization done in 2008 as well as February of 2011. Both of them showed minor irregularities without significant disease.  No Known Allergies   Current Outpatient Prescriptions on File Prior to Visit  Medication Sig Dispense Refill  . chlorthalidone (HYGROTON) 25 MG tablet Take 25 mg by mouth daily.        . clonazePAM (KLONOPIN) 0.5 MG tablet Take 0.5 mg by mouth daily.        . DULoxetine (CYMBALTA) 60 MG capsule Take 60 mg by mouth daily.        . fondaparinux (ARIXTRA) 10 MG/0.8ML SOLN Inject 0.8 mLs (10 mg total) into the skin at bedtime.  15 Syringe  0  . isosorbide mononitrate (IMDUR) 30 MG 24 hr tablet Take 1 tablet by mouth Daily.      Marland Kitchen labetalol (NORMODYNE) 100 MG tablet Take 100 mg by mouth 2 (two) times daily.        Marland Kitchen levothyroxine (SYNTHROID, LEVOTHROID) 200 MCG tablet Take 200 mcg by mouth daily.        Marland Kitchen lisinopril (PRINIVIL,ZESTRIL) 20 MG tablet Take 20 mg by mouth daily.        . metFORMIN (GLUCOPHAGE) 500 MG tablet Take 500 mg by mouth 2 (two) times daily with a meal.         . omeprazole (PRILOSEC) 20 MG capsule Take 20 mg by mouth daily.        . simvastatin (ZOCOR) 40 MG tablet Take 1 tablet (40 mg total) by mouth every evening.  90 tablet  0  . torsemide (DEMADEX) 20 MG tablet Take 2 tabs (40mg ) every morning & 1 tab (20mg ) every afternoon or 6 hours after a.m. dose  90 tablet  6  . cyclobenzaprine (FLEXERIL) 10 MG tablet Take 10 mg by mouth 3 (three) times daily as needed.        . nitroGLYCERIN (NITROSTAT) 0.4 MG SL tablet Place 1 tablet (0.4 mg total) under the tongue every 5 (five) minutes as needed.  25 tablet  3  . DISCONTD: spironolactone (ALDACTONE) 25 MG tablet Take 1 tablet by mouth Twice daily.         Past Medical History  Diagnosis Date  . Obesity   . Hypertension   . Chest pain   . OSA (obstructive sleep apnea)     CPAP  . Depression   . Postphlebitic syndrome     Secondary to recurrent left lower shin faint ecchymosis w/ chronic thrombosis of lefttthigh greater saphenous  . Endocarditis   . Dyspnea   . DVT (deep vein thrombosis) in pregnancy     IVC  .  Diabetes mellitus   . Hyperlipidemia   . CAD (coronary artery disease) 2008, 2011    Mild coronary plaque.  w/ normal LV function. repeated cardiac cath on 07/2008.     Past Surgical History  Procedure Date  . Thyroidectomy   . Cardiac catheterization 2008  . Cardiac catheterization 07/2009    minor irregulreties without obstructive disease     Family History  Problem Relation Age of Onset  . Coronary artery disease       History   Social History  . Marital Status: Married    Spouse Name: RITA    Number of Children: 1  . Years of Education: N/A   Occupational History  . disabled    Social History Main Topics  . Smoking status: Former Smoker -- 1.0 packs/day for 6 years    Types: Cigarettes    Quit date: 01/22/1974  . Smokeless tobacco: Never Used  . Alcohol Use: No  . Drug Use: No  . Sexually Active: Not on file   Other Topics Concern  . Not on file    Social History Narrative  . No narrative on file      PHYSICAL EXAM   BP 146/77  Pulse 87  Ht 5\' 11"  (1.803 m)  Wt 313 lb 12.8 oz (142.339 kg)  BMI 43.77 kg/m2  Constitutional: He is oriented to person, place, and time. He appears well-developed and well-nourished. No distress.  HENT: No nasal discharge.  Head: Normocephalic and atraumatic.  Eyes: Pupils are equal, round, and reactive to light. Right eye exhibits no discharge. Left eye exhibits no discharge.  Neck: Normal range of motion. Neck supple. No JVD present. No thyromegaly present.  Cardiovascular: Normal rate, regular rhythm, normal heart sounds. Exam reveals no gallop and no friction rub.  There is a 2/6 holosystolic murmur at the apex and left sternal border. Pulmonary/Chest: Effort normal and breath sounds normal. No stridor. No respiratory distress. He has no wheezes. He has no rales. He exhibits no tenderness.  Abdominal: Soft. Bowel sounds are normal. He exhibits no distension. There is no tenderness. There is no rebound and no guarding.  Musculoskeletal: Normal range of motion. He exhibits trace edema and no tenderness.  Neurological: He is alert and oriented to person, place, and time. Coordination normal.  Skin: Skin is warm and dry. No rash noted. He is not diaphoretic. No erythema. No pallor.  Psychiatric: He has a normal mood and affect. His behavior is normal. Judgment and thought content normal.        ASSESSMENT AND PLAN

## 2010-11-23 NOTE — Assessment & Plan Note (Signed)
The patient had recent episodes of chest tightness associated with dyspnea. It is mainly exertional at its likely due to congestive heart failure and not coronary artery disease. His most recent cardiac catheterization was in February of 2011 and showed no significant coronary artery disease. Thus, I will not repeat ischemic evaluation at this time.

## 2010-11-23 NOTE — Telephone Encounter (Signed)
Pt has [employer], no precert required for TEE/CH

## 2010-11-29 ENCOUNTER — Encounter: Payer: Self-pay | Admitting: *Deleted

## 2010-11-30 DIAGNOSIS — I059 Rheumatic mitral valve disease, unspecified: Secondary | ICD-10-CM

## 2010-12-02 DIAGNOSIS — I428 Other cardiomyopathies: Secondary | ICD-10-CM

## 2010-12-03 DIAGNOSIS — R0602 Shortness of breath: Secondary | ICD-10-CM

## 2010-12-03 DIAGNOSIS — I339 Acute and subacute endocarditis, unspecified: Secondary | ICD-10-CM

## 2010-12-04 ENCOUNTER — Encounter: Payer: Self-pay | Admitting: Cardiology

## 2010-12-14 ENCOUNTER — Ambulatory Visit (INDEPENDENT_AMBULATORY_CARE_PROVIDER_SITE_OTHER): Payer: Medicare Other | Admitting: Cardiovascular Disease

## 2010-12-14 ENCOUNTER — Other Ambulatory Visit: Payer: Self-pay | Admitting: *Deleted

## 2010-12-14 ENCOUNTER — Telehealth: Payer: Self-pay | Admitting: *Deleted

## 2010-12-14 ENCOUNTER — Encounter: Payer: Self-pay | Admitting: Cardiovascular Disease

## 2010-12-14 DIAGNOSIS — I10 Essential (primary) hypertension: Secondary | ICD-10-CM

## 2010-12-14 DIAGNOSIS — I359 Nonrheumatic aortic valve disorder, unspecified: Secondary | ICD-10-CM

## 2010-12-14 DIAGNOSIS — I82409 Acute embolism and thrombosis of unspecified deep veins of unspecified lower extremity: Secondary | ICD-10-CM

## 2010-12-14 DIAGNOSIS — I351 Nonrheumatic aortic (valve) insufficiency: Secondary | ICD-10-CM | POA: Insufficient documentation

## 2010-12-14 DIAGNOSIS — R0600 Dyspnea, unspecified: Secondary | ICD-10-CM

## 2010-12-14 MED ORDER — ISOSORBIDE MONONITRATE ER 30 MG PO TB24: 30.0000 mg | ORAL_TABLET | Freq: Every day | ORAL | Status: DC

## 2010-12-14 NOTE — Patient Instructions (Signed)
   Your physician wants you to follow-up in: 3 months. You will receive a reminder letter in the mail one-two months in advance. If you don't receive a letter, please call our office to schedule the follow-up appointment.  Your physician recommends that you continue on your current medications as directed. Please refer to the Current Medication list given to you today.  

## 2010-12-14 NOTE — Assessment & Plan Note (Signed)
Recent transesophageal echocardiogram showed moderate to severe aortic insufficiency with a small vegetation which I suspect was an old vegetation given that he did not really have any clinical symptoms of endocarditis and blood cultures and other labs came back unremarkable. He seems to be doing reasonably well at this time. Unfortunately, transthoracic echocardiogram was very limited in evaluating his aortic valve. Thus, I recommend a followup transesophageal echocardiogram in 3-6 months to make sure that the insufficiency is not progressing quickly.

## 2010-12-14 NOTE — Telephone Encounter (Signed)
Pt had cardionet ordered at hospital d/c. Per cardionet pt needs [facility] authorization request. They asked [place] to obtain this. Pt needs MCOT or Event (whichever insurance will pay for-21 days)  Per Cardionet, you will need this info:  CPT code 16109 Tax ID [id number] NPI [id number]

## 2010-12-14 NOTE — Assessment & Plan Note (Signed)
The patient is on lifelong anticoagulation with subcutaneous Arixtra. Hopefully in the future, some of the the new anti factor 10 oral inhibitors will be approved for DVT and pulmonary embolism so that it can be used on somebody like Mr. [name] [name].

## 2010-12-14 NOTE — Assessment & Plan Note (Signed)
This is likely multifactorial but there seems to be a component of diastolic heart failure. Continue current dose of torsemide. His blood pressure is well-controlled at this time.

## 2010-12-14 NOTE — Assessment & Plan Note (Signed)
Blood pressure is well controlled. Continue current medications. 

## 2010-12-14 NOTE — Progress Notes (Signed)
HPI   this is a 60 year old male who is here today for a followup visit after recent hospitalization at [facility]. He was recently evaluated for increased dyspnea. It was thought that there was a component of diastolic heart failure. He was given diuretics with partial improvement. He did have previous endocarditis on the mitral valve last year. Transthoracic echocardiogram was overall of limited quality. Due to his continued symptoms, I decided to proceed with a transesophageal echocardiogram. This showed no evidence of vegetation on the mitral valve with only mild regurgitation. LV systolic function was normal. Surprisingly, there was a small vegetation on the right coronary cusp of the aortic valve prolapsing behind the left coronary cusp and causing moderate to severe aortic insufficiency. The patient is at risk for recurrent endocarditis do to life long injection with Arixtra. The patient was admitted to the hospital due to this finding. Blood cultures were obtained. However, throughout his hospitalization he did not show any signs of infection at all. He was afebrile with a normal white cell count. ESR was only slightly elevated. Blood cultures came back negative. Thus, antibiotics were discontinued and the patient was discharged home. He has been feeling reasonably well since hospital discharge. He denies any fevers or chills. His dyspnea has actually improved. He is not having any chest pain.  No Known Allergies   Current Outpatient Prescriptions on File Prior to Visit  Medication Sig Dispense Refill  . clonazePAM (KLONOPIN) 0.5 MG tablet Take 0.5 mg by mouth daily.        . DULoxetine (CYMBALTA) 60 MG capsule Take 60 mg by mouth daily.        . fondaparinux (ARIXTRA) 10 MG/0.8ML SOLN Inject 0.8 mLs (10 mg total) into the skin at bedtime.  15 Syringe  0  . levothyroxine (SYNTHROID, LEVOTHROID) 200 MCG tablet Take 200 mcg by mouth daily.        . metFORMIN (GLUCOPHAGE) 500 MG tablet Take  500 mg by mouth 2 (two) times daily with a meal.        . nitroGLYCERIN (NITROSTAT) 0.4 MG SL tablet Place 1 tablet (0.4 mg total) under the tongue every 5 (five) minutes as needed.  25 tablet  3  . omeprazole (PRILOSEC) 20 MG capsule Take 20 mg by mouth daily.        . simvastatin (ZOCOR) 40 MG tablet Take 1 tablet (40 mg total) by mouth every evening.  90 tablet  0  . torsemide (DEMADEX) 20 MG tablet Take 2 tabs (40mg ) every morning & 1 tab (20mg ) every afternoon or 6 hours after a.m. dose  90 tablet  6  . DISCONTD: labetalol (NORMODYNE) 100 MG tablet Take 1 tablet (100 mg total) by mouth 2 (two) times daily.  60 tablet  6  . DISCONTD: chlorthalidone (HYGROTON) 25 MG tablet Take 25 mg by mouth daily.        Marland Kitchen DISCONTD: cyclobenzaprine (FLEXERIL) 10 MG tablet Take 10 mg by mouth 3 (three) times daily as needed.        Marland Kitchen DISCONTD: isosorbide mononitrate (IMDUR) 30 MG 24 hr tablet Take 1 tablet by mouth Daily.      Marland Kitchen DISCONTD: lisinopril (PRINIVIL,ZESTRIL) 20 MG tablet Take 20 mg by mouth daily.           Past Medical History  Diagnosis Date  . Obesity   . Hypertension   . Chest pain   . OSA (obstructive sleep apnea)     CPAP  . Depression   .  Postphlebitic syndrome     Secondary to recurrent left lower shin faint ecchymosis w/ chronic thrombosis of lefttthigh greater saphenous  . Endocarditis   . Dyspnea   . DVT (deep vein thrombosis) in pregnancy     IVC  . Diabetes mellitus   . Hyperlipidemia   . CAD (coronary artery disease) 2008, 2011    Mild coronary plaque.  w/ normal LV function. repeated cardiac cath on 07/2009  . Aortic insufficiency 11/2010    moderate to severe. Small vegetation likely old.      Past Surgical History  Procedure Date  . Thyroidectomy   . Cardiac catheterization 2008  . Cardiac catheterization 07/2009    minor irregulreties without obstructive disease     Family History  Problem Relation Age of Onset  . Coronary artery disease       History     Social History  . Marital Status: Married    Spouse Name: RITA    Number of Children: 1  . Years of Education: N/A   Occupational History  . disabled    Social History Main Topics  . Smoking status: Former Smoker -- 1.0 packs/day for 6 years    Types: Cigarettes    Quit date: 01/22/1974  . Smokeless tobacco: Never Used  . Alcohol Use: No  . Drug Use: No  . Sexually Active: Not on file   Other Topics Concern  . Not on file   Social History Narrative  . No narrative on file      PHYSICAL EXAM   BP 129/74  Pulse 84  Ht 5\' 11"  (1.803 m)  Wt 308 lb (139.708 kg)  BMI 42.96 kg/m2  SpO2 97%  Constitutional: He is oriented to person, place, and time. He appears well-developed and well-nourished. No distress.  HENT: No nasal discharge.  Head: Normocephalic and atraumatic.  Eyes: Pupils are equal, round, and reactive to light. Right eye exhibits no discharge. Left eye exhibits no discharge.  Neck: Normal range of motion. Neck supple. No JVD present. No thyromegaly present.  Cardiovascular: Normal rate, regular rhythm, normal heart sounds and intact distal pulses. Exam reveals no gallop and no friction rub.  No murmur heard.  Pulmonary/Chest: Effort normal and breath sounds normal. No stridor. No respiratory distress. He has no wheezes. He has no rales. He exhibits no tenderness.  Abdominal: Soft. Bowel sounds are normal. He exhibits no distension. There is no tenderness. There is no rebound and no guarding.  Musculoskeletal: Normal range of motion. He exhibits no edema and no tenderness.  Neurological: He is alert and oriented to person, place, and time. Coordination normal.  Skin: Skin is warm and dry. No rash noted. He is not diaphoretic. No erythema. No pallor.  Psychiatric: He has a normal mood and affect. His behavior is normal. Judgment and thought content normal.        ASSESSMENT AND PLAN

## 2010-12-27 ENCOUNTER — Emergency Department (HOSPITAL_COMMUNITY)
Admission: EM | Admit: 2010-12-27 | Discharge: 2010-12-28 | Disposition: A | Discharge status: Home / Self Care | Payer: Medicare Other | Attending: Emergency Medicine | Admitting: Emergency Medicine

## 2010-12-27 DIAGNOSIS — I509 Heart failure, unspecified: Secondary | ICD-10-CM | POA: Insufficient documentation

## 2010-12-27 DIAGNOSIS — Z86718 Personal history of other venous thrombosis and embolism: Secondary | ICD-10-CM | POA: Insufficient documentation

## 2010-12-27 DIAGNOSIS — IMO0002 Reserved for concepts with insufficient information to code with codable children: Secondary | ICD-10-CM | POA: Insufficient documentation

## 2010-12-27 DIAGNOSIS — E119 Type 2 diabetes mellitus without complications: Secondary | ICD-10-CM | POA: Insufficient documentation

## 2010-12-27 DIAGNOSIS — I10 Essential (primary) hypertension: Secondary | ICD-10-CM | POA: Insufficient documentation

## 2010-12-27 DIAGNOSIS — Z79899 Other long term (current) drug therapy: Secondary | ICD-10-CM | POA: Insufficient documentation

## 2010-12-27 DIAGNOSIS — M5430 Sciatica, unspecified side: Secondary | ICD-10-CM | POA: Insufficient documentation

## 2011-01-28 ENCOUNTER — Other Ambulatory Visit: Payer: Self-pay | Admitting: *Deleted

## 2011-01-28 DIAGNOSIS — E785 Hyperlipidemia, unspecified: Secondary | ICD-10-CM

## 2011-01-28 MED ORDER — SIMVASTATIN 40 MG PO TABS: 40.0000 mg | ORAL_TABLET | Freq: Every evening | ORAL | Status: DC

## 2011-02-28 ENCOUNTER — Telehealth: Payer: Self-pay | Admitting: *Deleted

## 2011-02-28 NOTE — Telephone Encounter (Signed)
See e-mail below that was sent to Dr. Margarita Mail phone by patient.  [name],  I think that's a good idea. I will forward the message to [facility]. You do have the option to wait until the visit because I have a small handheld ECHO that can do most of what the formal ECHO does. I can do this at no charge as part of the exam . If I see a problem we can still get an official ECHO . Some bloodwork including a heart failure level - called BNP , would also be a goo idea . Let me know what you would prefer.  [name] [name]   Sent from my iPhone   On Feb 26, 2011, at 8:17 PM, [name] [email or web address] wrote:   Dr. Andee Lineman, I know you are busy so I will be brief. I have an office visit with you September 25th and wanted your thoughts on having an echogram of my heart prior to the visit. I am still taking 20mg  of Torsemide X2 daily but have experienced increased frequency in shortness of breath and tightness in the chest, especially with walking over short lengths.  I thought an echogram would indicate any further damage to my aorta.  As always, I trust your judgment in making a determination.  Thank you for your time and concern.   [name] [name] [name]  "When you come to a fork in the road, take it." [name]

## 2011-03-18 ENCOUNTER — Encounter: Payer: Self-pay | Admitting: Cardiology

## 2011-03-19 ENCOUNTER — Ambulatory Visit (INDEPENDENT_AMBULATORY_CARE_PROVIDER_SITE_OTHER): Payer: Medicare Other | Admitting: Cardiology

## 2011-03-19 ENCOUNTER — Encounter: Payer: Self-pay | Admitting: *Deleted

## 2011-03-19 ENCOUNTER — Encounter: Payer: Self-pay | Admitting: Cardiology

## 2011-03-19 VITALS — BP 139/72 | HR 85 | Ht 71.0 in | Wt 316.0 lb

## 2011-03-19 DIAGNOSIS — I2510 Atherosclerotic heart disease of native coronary artery without angina pectoris: Secondary | ICD-10-CM

## 2011-03-19 DIAGNOSIS — I351 Nonrheumatic aortic (valve) insufficiency: Secondary | ICD-10-CM

## 2011-03-19 DIAGNOSIS — I82409 Acute embolism and thrombosis of unspecified deep veins of unspecified lower extremity: Secondary | ICD-10-CM

## 2011-03-19 DIAGNOSIS — R0602 Shortness of breath: Secondary | ICD-10-CM

## 2011-03-19 DIAGNOSIS — I38 Endocarditis, valve unspecified: Secondary | ICD-10-CM

## 2011-03-19 DIAGNOSIS — I359 Nonrheumatic aortic valve disorder, unspecified: Secondary | ICD-10-CM

## 2011-03-19 MED ORDER — POTASSIUM CHLORIDE CRYS ER 20 MEQ PO TBCR: 20.0000 meq | EXTENDED_RELEASE_TABLET | Freq: Every day | ORAL | Status: DC

## 2011-03-19 MED ORDER — TORSEMIDE 20 MG PO TABS: ORAL_TABLET | ORAL | Status: DC

## 2011-03-19 NOTE — Assessment & Plan Note (Signed)
Patient will need to be lifelong on anticoagulant therapy due to recurrent DVT. He status post IVC filter placement. He also has a history of postphlebitic syndrome.

## 2011-03-19 NOTE — Assessment & Plan Note (Signed)
Nonobstructive coronary artery disease. However the patient has significant dyspnea is likely secondary to worsening aortic insufficiency and diastolic heart failure and I will increase his Demadex to 40 mg twice a day. We'll also add potassium supplements 20 mEq a day and check a BMET level

## 2011-03-19 NOTE — Patient Instructions (Addendum)
   TEE - scheduled for next week Your physician recommends that you go to the [facility] for lab work today - BMET Increase Torsemide 40mg  twice a day   Begin K-Dur daily Follow up - will be decided at time of procedure above

## 2011-03-19 NOTE — Assessment & Plan Note (Signed)
The patient appears to have symptomatic aortic insufficiency. He has a very pathologic diastolic murmur on physical examination and determination of the diastolic component in mid diastole. The patient will be scheduled for transesophageal echocardiogram to assess his aortic valve. If the patient has severe aortic insufficiency he will be referred for aortic valve replacement. He did have a cardiac catheterization in 2011. He needs a repeat cardiac catheterization on the paradox will need to be discontinued only very briefly. We'll also evaluate his mitral valve during the present material echocardiogram.

## 2011-03-19 NOTE — Progress Notes (Signed)
HPI The patient is a [age]-year-old male with a prior history of mitral valve endocarditis. He also has a history of diastolic heart failure. More recently he was evaluated because of increased shortness of breath and was found a small vegetation the right coronary cusp of the aortic valve prolapsing behind the left coronary cusp and causing moderate to severe aortic insufficiency. The patient was briefly hospitalized. Blood cultures were within normal limits a sedimentation rate was also normal. He was seen in followup by Dr. Kirke Corin who recommended close followup for the patient's aortic insufficiency. Initially the patient had improvement in symptoms with diuretic therapy. The patient now reports that he has become increasingly short of breath on minimal exertion. This has been going on for approximately 2-3 weeks. He also has gained 8 pounds since his last office visit. He has been compliant with his Demadex and has been watching his fluid intake. He reports no recurrent fevers or chills.  No Known Allergies  Current Outpatient Prescriptions on File Prior to Visit  Medication Sig Dispense Refill  . clonazePAM (KLONOPIN) 0.5 MG tablet Take 0.5 mg by mouth daily.        . DULoxetine (CYMBALTA) 60 MG capsule Take 60 mg by mouth daily.        . fondaparinux (ARIXTRA) 10 MG/0.8ML SOLN Inject 0.8 mLs (10 mg total) into the skin at bedtime.  15 Syringe  0  . HYDROcodone-acetaminophen (VICODIN) 5-500 MG per tablet Take 1 tablet by mouth every 6 (six) hours as needed.        . labetalol (NORMODYNE) 100 MG tablet Take 100 mg by mouth daily.        Marland Kitchen levothyroxine (SYNTHROID, LEVOTHROID) 200 MCG tablet Take 200 mcg by mouth daily.        . metFORMIN (GLUCOPHAGE) 500 MG tablet Take 500 mg by mouth 2 (two) times daily with a meal.        . nitroGLYCERIN (NITROSTAT) 0.4 MG SL tablet Place 1 tablet (0.4 mg total) under the tongue every 5 (five) minutes as needed.  25 tablet  3  . NON FORMULARY CPAP       .  omeprazole (PRILOSEC) 20 MG capsule Take 20 mg by mouth daily.        . simvastatin (ZOCOR) 40 MG tablet Take 1 tablet (40 mg total) by mouth every evening.  90 tablet  0    Past Medical History  Diagnosis Date  . Obesity   . Hypertension   . Chest pain   . OSA (obstructive sleep apnea)     CPAP  . Depression   . Postphlebitic syndrome     Secondary to recurrent left lower shin faint ecchymosis w/ chronic thrombosis of lefttthigh greater saphenous  . Endocarditis   . Dyspnea   . DVT (deep vein thrombosis) in pregnancy     IVC  . Diabetes mellitus   . Hyperlipidemia   . CAD (coronary artery disease) 2008, 2011    Mild coronary plaque.  w/ normal LV function. repeated cardiac cath on 07/2009  . Aortic insufficiency 11/2010    moderate to severe. Small vegetation likely old.     Past Surgical History  Procedure Date  . Thyroidectomy   . Cardiac catheterization 2008  . Cardiac catheterization 07/2009    minor irregulreties without obstructive disease    Family History  Problem Relation Age of Onset  . Coronary artery disease      History   Social History  . Marital  Status: Married    Spouse Name: RITA    Number of Children: 1  . Years of Education: N/A   Occupational History  . disabled    Social History Main Topics  . Smoking status: Former Smoker -- 1.0 packs/day for 6 years    Types: Cigarettes    Quit date: 01/22/1974  . Smokeless tobacco: Never Used  . Alcohol Use: No  . Drug Use: No  . Sexually Active: Not on file   Other Topics Concern  . Not on file   Social History Narrative  . No narrative on file   [id number] positives as outlined above. The remainder of the 18  point review of systems is negative  PHYSICAL EXAM BP 139/72  Pulse 85  Ht 5\' 11"  (1.803 m)  Wt 316 lb (143.337 kg)  BMI 44.07 kg/m2  General: Well-developed, well-nourished in no distress Head: Normocephalic and atraumatic Eyes:PERRLA/EOMI intact, conjunctiva and lids  normal Ears: No deformity or lesions Mouth:normal dentition, normal posterior pharynx Neck: Supple, no JVD.  No masses, thyromegaly or abnormal cervical nodes Lungs: Normal breath sounds bilaterally without wheezing.  Normal percussion Cardiac: regular rate and rhythm with soft second and first heart sound., no S3 or S4.  PMI is normal.  3/6 diastolic murmur terminating in mid diastole. Abdomen: Normal bowel sounds, abdomen is soft and nontender without masses, organomegaly or hernias noted.  No hepatosplenomegaly MSK: Back normal, normal gait muscle strength and tone normal Vascular: Pulse is normal in all 4 extremities Extremities: No peripheral pitting edema Neurologic: Alert and oriented x 3 Skin: Intact without lesions or rashes Lymphatics: No significant adenopathy Psychologic: Normal affect   ECG: Not available  ASSESSMENT AND PLAN

## 2011-03-19 NOTE — Assessment & Plan Note (Signed)
No evidence of clinically recurrent endocarditis. No fever or chills.

## 2011-03-22 ENCOUNTER — Other Ambulatory Visit: Payer: Self-pay | Admitting: *Deleted

## 2011-03-22 DIAGNOSIS — R0602 Shortness of breath: Secondary | ICD-10-CM

## 2011-03-22 DIAGNOSIS — I359 Nonrheumatic aortic valve disorder, unspecified: Secondary | ICD-10-CM

## 2011-03-25 ENCOUNTER — Telehealth: Payer: Self-pay | Admitting: *Deleted

## 2011-03-25 NOTE — Telephone Encounter (Signed)
No precert required 

## 2011-03-25 NOTE — Telephone Encounter (Signed)
TEE scheduled for Wednesday, October 3 at 9:00 at [facility].

## 2011-03-27 DIAGNOSIS — I359 Nonrheumatic aortic valve disorder, unspecified: Secondary | ICD-10-CM

## 2011-03-29 ENCOUNTER — Institutional Professional Consult (permissible substitution) (INDEPENDENT_AMBULATORY_CARE_PROVIDER_SITE_OTHER): Payer: Medicare Other | Admitting: Thoracic Surgery (Cardiothoracic Vascular Surgery)

## 2011-03-29 VITALS — BP 137/72 | HR 81 | Resp 20 | Ht 71.0 in | Wt 317.0 lb

## 2011-03-29 DIAGNOSIS — I340 Nonrheumatic mitral (valve) insufficiency: Secondary | ICD-10-CM | POA: Insufficient documentation

## 2011-03-29 DIAGNOSIS — I050 Rheumatic mitral stenosis: Secondary | ICD-10-CM

## 2011-03-29 DIAGNOSIS — I359 Nonrheumatic aortic valve disorder, unspecified: Secondary | ICD-10-CM

## 2011-03-29 DIAGNOSIS — I059 Rheumatic mitral valve disease, unspecified: Secondary | ICD-10-CM

## 2011-03-29 DIAGNOSIS — I351 Nonrheumatic aortic (valve) insufficiency: Secondary | ICD-10-CM

## 2011-03-29 NOTE — Progress Notes (Signed)
PCP is TAPPER,DAVID B, MD Referring Provider is de Karma Lew, MD  Chief Complaint  Patient presents with  . Aortic Insuffiency    Referral from Dr Andee Lineman for surgical eval,  TEE 03/27/11   . 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 The patient has other medical problems including hypertension, type 2 diabetes mellitus, hyperlipidemia, hypothyroidism, GE reflux disease, and some type of hypercoagulable state with recurrent deep venous thrombosis. The patient states that he first began having lower extremity deep venous thrombosis in 1993. He initially had attempted to be managed on long-term Coumadin therapy, but he continued to have deep venous thrombosis on Coumadin therapy due to complete inability to maintain therapeutic state. Ultimately he was converted to long-term injections with fondaparinux.  In June of 2011 he was hospitalized with bacterial endocarditis. Details of that hospital admission are not currently available but the patient was noted to have transesophageal echocardiogram evidence for mitral valve endocarditis. Patient was again hospitalized in June of 2012 for presumed endocarditis with the development of moderate to severe aortic insufficiency. Over the last year and a half to he has developed worsening symptoms of chronic diastolic congestive heart failure. He was seen in followup recently by Dr. Andee Lineman and a repeat transesophageal echocardiogram was performed. This revealed worsening aortic insufficiency and mitral regurgitation. The patient has now been referred to consider elective surgical consultation.   Past Medical History  Diagnosis Date  . Obesity   . Hypertension   . Chest pain   . OSA (obstructive sleep apnea)     CPAP  . Depression   . Postphlebitic syndrome     Secondary to  recurrent left lower shin faint ecchymosis w/ chronic thrombosis of lefttthigh greater saphenous  . Endocarditis   . Dyspnea   . DVT (deep vein thrombosis) in pregnancy     IVC  . Diabetes mellitus   . Hyperlipidemia   . CAD (coronary artery disease) 2008, 2011    Mild coronary plaque.  w/ normal LV function. repeated cardiac cath on 07/2009  . Aortic insufficiency 11/2010    moderate to severe. Small vegetation likely old.   . MS (mitral stenosis)     Past Surgical History  Procedure Date  . Thyroidectomy   . Cardiac catheterization 2008  . Cardiac catheterization 07/2009    minor irregulreties without obstructive disease    Family History  Problem Relation Age of Onset  . Coronary artery disease      Social History History  Substance Use Topics  . Smoking status: Former Smoker -- 1.0 packs/day for 6 years    Types: Cigarettes    Quit date: 01/22/1974  . Smokeless tobacco: Never Used  . Alcohol Use: No    Current Outpatient Prescriptions  Medication Sig Dispense Refill  . clonazePAM (KLONOPIN) 0.5 MG tablet Take 0.5 mg by mouth daily.        . DULoxetine (CYMBALTA) 60 MG capsule Take 60 mg by mouth daily.        . fondaparinux (ARIXTRA) 10 MG/0.8ML SOLN Inject 0.8 mLs (10 mg total) into the skin at bedtime.  15 Syringe  0  . HYDROcodone-acetaminophen (VICODIN) 5-500 MG per tablet Take 1 tablet by mouth every 6 (six) hours as needed.        . isosorbide mononitrate (IMDUR) 30 MG 24 hr tablet  Take 30 mg by mouth daily.        Marland Kitchen labetalol (NORMODYNE) 100 MG tablet Take 100 mg by mouth daily.        Marland Kitchen levothyroxine (SYNTHROID, LEVOTHROID) 200 MCG tablet Take 200 mcg by mouth daily.        . metFORMIN (GLUCOPHAGE) 500 MG tablet Take 500 mg by mouth 2 (two) times daily with a meal.        . nitroGLYCERIN (NITROSTAT) 0.4 MG SL tablet Place 1 tablet (0.4 mg total) under the tongue every 5 (five) minutes as needed.  25 tablet  3  . NON FORMULARY CPAP       . omeprazole  (PRILOSEC) 20 MG capsule Take 20 mg by mouth daily.        . ONGLYZA 5 MG TABS tablet Take 5 mg by mouth daily.       . potassium chloride SA (K-DUR,KLOR-CON) 20 MEQ tablet Take 1 tablet (20 mEq total) by mouth daily.  30 tablet  6  . simvastatin (ZOCOR) 40 MG tablet Take 1 tablet (40 mg total) by mouth every evening.  90 tablet  0  . torsemide (DEMADEX) 20 MG tablet Take 2 tabs (40mg ) twice a day  120 tablet  6    No Known Allergies  Review of Systems  Constitutional: Positive for activity change, fatigue and unexpected weight change. Negative for fever, chills, diaphoresis and appetite change.       The patient describes progressive exertional fatigue and a gradual weight gain of more than 10 pounds over the past 2 months.  HENT: Negative for nosebleeds, facial swelling, neck pain and neck stiffness.   Eyes: Negative for visual disturbance.  Respiratory: Positive for apnea, chest tightness and shortness of breath. Negative for cough, wheezing and stridor.        The patient describes progressive exertional shortness of breath.  He uses CPAP at night for OSA.  Cardiovascular: Positive for chest pain and leg swelling. Negative for palpitations.       The patient describes progressive shortness of breath, now getting winded with fairly mild physical activity.  He denies resting shortness of breath, orthopnea, syncope or near syncope.  He has developed intermittent chest heaviness or tightness over the last 4 months which is not necessarily associated with physical activity or shortness of breath.  The chest tightness is relieved by sublingual nitroglycerine.  Gastrointestinal: Negative for abdominal distention.       He reports no difficulty swallowing.  Symptoms of reflux are well controlled with Prilosec therapy.  Bowel function is regular.  Genitourinary: Negative.   Musculoskeletal: Positive for joint swelling and arthralgias.       The patient has severe arthritis afflicting both knees  although he can walk without a cane or other assistance.  Neurological: Negative.   Hematological:       Positive for history of possible hypercoagulable state.  Psychiatric/Behavioral:       Chronic anxiety that is currently worse because of personal matters.    BP 137/72  Pulse 81  Resp 20  Ht 5\' 11"  (1.803 m)  Wt 317 lb (143.79 kg)  BMI 44.21 kg/m2  SpO2 97% Physical Exam  Vitals reviewed. Constitutional: He is oriented to person, place, and time. He appears well-developed and well-nourished.       Morbidly obese.  HENT:  Head: Normocephalic.  Eyes: Pupils are equal, round, and reactive to light.  Neck: Normal range of motion. Neck supple. No JVD present.  No thyromegaly present.  Cardiovascular: Normal rate and regular rhythm.   Murmur heard.      Distant heart sounds with soft murmur difficult to characterize.  Abdominal: Soft. Bowel sounds are normal. There is no tenderness.  Musculoskeletal: Normal range of motion.       Severe varicose veins with chronic venous insufficiency both lower extremities, left > right, and moderate bilateral lower extremity edema.  Lymphadenopathy:    He has no cervical adenopathy.  Neurological: He is alert and oriented to person, place, and time.  Skin: Skin is warm.  Psychiatric: He has a normal mood and affect. His behavior is normal. Judgment and thought content normal.     Diagnostic Tests:  Transesophageal echocardiogram performed 03/27/2011 at [facility] ([facility]) is reviewed. This demonstrates moderate to severe aortic insufficiency. The aortic valve is tricuspid. There is suggestion of previous vegetation on the valve. There is moderate mitral regurgitation with type I dysfunction. The mitral valve leaflets are thin and seemed to move normally. There is some annular dilatation. There may be some defect in the leaflets related to previous history of endocarditis. There is no obvious vegetation on the mitral valve. I believe  there is a high likelihood that the mitral valve should be repairable. There is much less likely chance that the aortic valve could be repairable. There was limited images of left ventricular size and function. The left atrium is enlarged.   Impression:  The patient is a 60 year old morbidly obese white male with multiple medical problems including some ill-defined hypercoagulable state with history of recurrent deep venous thrombosis in the past. He currently remains on long-term anticoagulation therapy with daily injections using fondaparinux. He has had at least 2 episodes of bacterial endocarditis and now has worsening aortic insufficiency and mitral regurgitation with the secondary development of worsening diastolic congestive heart failure. I agree that elective surgical intervention is probably indicated at this time. The aortic valve will likely need to be replaced. I feel there is a high likelihood that the mitral valve should be repairable. At this point I feel that the patient has probably failed long-term anticoagulation therapy using home injections with fondaparinux. It seems reasonable to assume that this was the portal entry for his 2 episodes of bacterial endocarditis, as the patient does not appear to have any pre-existing valve related pathology and there is no other obvious portal entry for recurrent bacterial infection.  The choice of valve prosthesis is important.  The patient has already failed long-term anticoagulation with Coumadin therapy for DVT. As such we could consider off label use of a direct thrombin inhibitor for both treatment of recurrent deep venous thrombosis and perhaps the placement of a mechanical prosthesis in the aortic position. We could use a bioprosthetic tissue valve for aortic valve replacement, although with this patient's relatively young age it would come with serious potential for late structural valve deterioration in failure. Because he needs to be  anticoagulated anyways, I think it would make the most sense to use a mechanical prosthesis and put him on some type of direct thrombin inhibitor. Hopefully the mitral valve will be repairable. Based upon his recent transesophageal echocardiogram this seems likely. I would like to see the results of transthoracic echocardiogram to get a better feeling for the degree of left ventricular dysfunction that he may or may not have. Finally, the patient is having intermittent episodes of chest tightness that could be angina. As a result I think that repeat left and right heart  catheterization would be important prior to surgery to both make certain that he hasn't developed coronary artery disease and to reassess his right heart pressures.  Plan: I discussed matters at length with the patient and his wife here in the office today. We spent more than 60 minutes reviewing the indications, risks, and potential been sent to surgery. We discussed a variety of surgical alternatives. The patient is interested in proceeding within the next few weeks. He will discuss matters further with his family and contact our office and the office of Dr. Andee Lineman early next week to schedule hospital admission for cardiac catheterization and surgery. In the meanwhile we will explore further options with respect to changing his long-term anticoagulation therapy. All of his questions been addressed.

## 2011-04-01 ENCOUNTER — Encounter: Payer: Medicare Other | Admitting: Thoracic Surgery (Cardiothoracic Vascular Surgery)

## 2011-04-05 ENCOUNTER — Telehealth: Payer: Self-pay | Admitting: *Deleted

## 2011-04-05 ENCOUNTER — Encounter: Payer: Self-pay | Admitting: *Deleted

## 2011-04-05 NOTE — Telephone Encounter (Signed)
Main lab - Tuesday, October 16 at 10:30. To arrive at 8:30. - Dr. Riley Kill

## 2011-04-05 NOTE — Progress Notes (Unsigned)
Ok. Will take care of it.  Loann Quill can you schedule Mr.Owensby for L+ R heart Cath Monday or Tuesday Make sure to hold fondaparinox day of Cath . Please also notify Mr. Matuszak.  Thanks   Sent from my iPhone   On Apr 01, 2011, at [place] PM, "Tressie Stalker MD" [email or web address]> wrote:  v\:* {behavior:url(#default#VML);} o\:* {behavior:url(#default#VML);} w\:* {behavior:url(#default#VML);} .shape {behavior:url(#default#VML);}  I have him scheduled for Thursday 10/18. Can we get him cathed on Monday or Tuesday? Thanks. From: Lewayne Bunting  Sent: Monday, April 01, 2011 6:54 PM To: Tressie Stalker MD Subject: RE: Macarius Ruark Thanks. Mr Ragas e-mailed me and I responded and gave our recommendation. He has agreed to proceed. Michelle Piper From: Tressie Stalker MD  Sent: Monday, April 01, 2011 4:57 PM To: Lewayne Bunting Subject: RE: Swayze Pries tissue valve and mitral repair is the plan if Mr Reser agrees  From: Lewayne Bunting Sent: Mon 04/01/2011 1:16 PM To: Tressie Stalker MD Subject: RE: Johnjoseph Rolfe There is no question it is better to switch him to and [facility] post- op given his history of endocarditis. I think we agree on that, but it would be a mistake to use off-label dabigatran or rivaroxiban. As a matter of fact, they have tried using dabigatran in [place] with ( even a less thrombogenic) StJude , bileaflet valve with disastrous consequences due to valve thrombosis ( I am not surprised about this at all. When we have time I tell you why even the RELY trial is dishonest in their conclusions. but that's another topic and because nobody looks at the statistics). Non- warfarin OAC's are not used in [place] for mechanical valves. There is one trial ongoing, but is only a dose finding phase II trial with dabigatran testing 150 mg, 220mg  and 300mg  PO BID ( much higher doses then non-valvular afib). My recommendation remains the same: tissue AVR and MV repair. Again , I am OK with a mechanical AoV, but  we are going to have a time with Coumadin and I think we would put the patient at unnecessary risk for valve thrombosis, although risk maybe small- but bets are off in a patient with an undefined "hypercoagulable state". I still will trade the risk of elective reoperation versus an acute emergency, but I will let you decide this together with Mr. Mounce. THROMBOSIS Don't use dabigatran off-label with mechanical valves, [place] docs warn Link to [place] experience: http://feedproxy.google.com/~r/Theheartorg-IphoneMainContentFeed/~3/En2t4dBXwzI/1450419.do Michelle Piper From: Tressie Stalker MD  Sent: Monday, April 01, 2011 10:46 AM To: Lewayne Bunting Subject: RE: Miliano Cotten As it turns out, my sources tell me that there aren't any good data (large clinical series) yet available for the use of dabigatran or any other DTI's for mechanical valves. Some phase II dosing studies are in the mix, but no data yet. There are anecdotal reports of valve thrombosis in patients receiving dabigatran off label, so I suspect the more conservative approach will be to either try him on Coumadin again or simply put in a tissue valve. I think it's still reasonable to offer Mr. Hirschman the off label use of dabigatran or another DTI, but I would caution him that we really don't know if it would be effective and as such it could be risky. However, I do think he needs to be switched to oral therapy of some type, as I would not be comfortable continuing self-injections after his surgery. From: Lewayne Bunting  Sent: Sunday, March 31, 2011 6:25 AM To: Tressie Stalker MD Subject: Re: Viviann Spare  [place] We have to proceed with valve replacement regardless. 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 Surgery cannot be avoided or delayed much longer. I am not worried, as you pointed out , about the a mechanical AVR, as long as you feel comfortable that MV repair is very likely ( which I think it is). I have no  problem with using rivaraxiban in this setting ( although off - label). Even in the worst case scenario with a mechanical MV , we still have the Coumadin option, but I would definitely set him up with frequent home monitoring in this case. The AVR tissue valve and MV repair would allows [place] to be more far more lenient about anticoagulation particularly with Coumadin monitoring and variable INRs at the cost of course of a possible future reoperation.  I know this a difficult situation and in am not sure there is a perfect answer. I am ,again for reasons you pointed out, comfortable with a Xa inhibitor with a mechanical AVR. I would not use dabigatran because my experience is that it is much less tolerated then Xa inhibitors because of GI side effects .  We can wait and see what your contacts say. I will also contact Pedro Brugada in [place] who was the PI on the DVT/PE trials and I have spoken with before.  Thanks so much.  Lorre Munroe from my iPhone  On Mar 30, 2011, at 2:44 PM, "Tressie Stalker MD" [email or web address]> wrote: I have reached out to some contacts who may have access to a fair amount of data regarding the use of direct thrombin inhibitors for mechanical valves. I'll let you know what I find. I saw the response you got from your contact in [facility]. Is there reason to reconsider Coumadin for Mr. Gasparyan? Should we hold off on surgery for a few weeks to sort this out? Once I replace his valve there's no turning back. As you point out, we can always just put in a tissue valve, but the truth is the thrombogenicity of mechanical valves in the aortic position is pretty low and he is going to be on long term anticoagulation anyways. Salvatore Decent. [name], MD [facility] [address], [address] [place], [place] [address] [phone] (phone) [phone] (fax) [email or web address]  From: Lewayne Bunting Sent: Fri 03/29/2011 2:19 PM To: Tressie Stalker  MD Subject: [name] I actually stand corrected we did not use Rivaroxiban, this note is from 03/20/2010 and the organism was Enterococcus faecalis ( certainly possible with improper technique). We just switched from Lovenox to Fondaparinox, although the patient at that time acquired about dabigatran, but was actually never switched because we could not get approval.  Visit Type: Follow-up Primary Provider: Lynden Oxford History of Present Illness: the patient is a 60 year old male with a history of hypercoagulable state on chronic Lovenox therapy. 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 This was a pansensitive organism with vegetation on the mitral valve. Patient underwent an eight-week course of antibiotics through a PICC line. The PICC line has been removed. His most recent blood work is from August 2011 his creatinine was 1.47 high sensitivity CRP was within normal limits hemoglobin was 10.9 and sedimentation rate 41 mm/h. Blood work was taken when the patient is still taking antibiotics. The patient is doing well. He denies any substernal chest pain. He denies any  shortness of breath. He reports no recurrent fevers or chills nausea or vomiting. He has no palpitations or syncope. Preventive Screening-Counseling & Management Alcohol-Tobacco Smoking Status: quit Year Quit: 1970 Current Medications (verified):  1) Simvastatin 40 Mg Tabs (Simvastatin) .... Take 1 Tablet By Mouth At Bedtime 2) Lisinopril 20 Mg Tabs (Lisinopril) .... Take One Tablet By Mouth Daily 3) Metformin Hcl 500 Mg Tabs (Metformin Hcl) .... Take 1 Tablet By Mouth Two Times A Day 4) Levothroid 200 Mcg Tabs (Levothyroxine Sodium) .... Take 1 Tablet By Mouth Once A Day 5) Chlorthalidone 25 Mg Tabs (Chlorthalidone) .... Take 1 Tablet By Mouth Once A Day 6) Prilosec 20 Mg Cpdr (Omeprazole) .... Take 1 Tablet By Mouth Once A Day 7)  Hydrocodone-Acetaminophen 5-500 Mg Tabs (Hydrocodone-Acetaminophen) .... As Needed Pain 8) Nitroglycerin 0.4 Mg Subl (Nitroglycerin) .... One Tablet Under Tongue Every 5 Minutes As Needed For Chest Pain---May Repeat Times Three 9) Lovenox 120 Mg/0.25ml Soln (Enoxaparin Sodium) .... Inject Sub Q Two Times A Day 10) Labetalol Hcl 200 Mg Tabs (Labetalol Hcl) .... Take 1 Tablet By Mouth Two Times A Day (Place On File) 11) Cymbalta 60 Mg Cpep (Duloxetine Hcl) .... Take 1 Tablet By Mouth Every Morning 12) Tylenol 325 Mg Tabs (Acetaminophen) .... As Needed 13) Imdur 30 Mg Xr24h-Tab (Isosorbide Mononitrate) .... Take 1 Tablet By Mouth Once A Day 14) Clonazepam 0.5 Mg Tabs (Clonazepam) .... Take 1 Tablet By Mouth Once A Day Allergies (verified):  No Known Drug Allergies Comments: Nurse/Medical Assistant: The patient's medication list and allergies were reviewed with the patient and were updated in the Medication and Allergy Lists. Past History: Family History: Last updated: 02/20/2009 Family History of Coronary Artery Disease:  Social History: Last updated: 02/20/2009 Disabled  Married  Tobacco Use - No.  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 Review of Systems  The patient  complains of muscle weakness, leg swelling, and depression. The patient denies fatigue, malaise, fever, weight gain/loss, vision loss, decreased hearing, hoarseness, chest pain, palpitations, shortness of breath, prolonged cough, wheezing, sleep apnea, coughing up blood, abdominal pain, blood in stool, nausea, vomiting, diarrhea, heartburn, incontinence, blood in urine, joint pain, rash, skin lesions, headache, fainting, dizziness, anxiety, enlarged lymph nodes, easy bruising or bleeding, and environmental allergies.  Vital Signs: Patient profile: 60 year old male Height: 71 inches Weight: 294 pounds BMI: 41.15 Pulse rate: 80 / minute BP sitting: 112 / 69 (left arm) Cuff size: large Vitals Entered By: Carlye Grippe (March 20, 2010 10:04 AM) Nutrition Counseling: Patient's BMI is greater than 25 and therefore counseled on weight management options. Physical Exam Additional Exam: General: Well-developed, well-nourished in no distress head: Normocephalic and atraumatic eyes PERRLA/EOMI intact, conjunctiva and lids normal nose: No deformity or lesions mouth normal dentition, normal posterior pharynx neck: Supple, no JVD. No masses, thyromegaly or abnormal cervical nodes lungs: Normal breath sounds bilaterally without wheezing. Normal percussion heart: regular rate and rhythm with normal S1 and S2, no  S3 or S4. PMI is normal. No pathological murmurs abdomen: Normal bowel sounds, abdomen is soft and nontender without masses, organomegaly or hernias noted. No hepatosplenomegaly musculoskeletal: Back normal, normal gait muscle strength and tone normal pulsus: Pulse is normal in all 4 extremities Extremities:one to 2+ peripheral pitting edema. Marked lower extremity varicosities. neurologic: Alert and oriented x 3 skin: Intact without lesions or rashes cervical nodes: No significant adenopathy psychologic: Normal affect Impression & Recommendations: Problem # 1: ENDOCARDITIS  (ICD-424.90) the patient is status post successful treatment for endocarditis. He has no recurrent fever or chills. Antibiotics have been discontinued. Problem # 2: COUMADIN THERAPY (ICD-V58.61) the patient is on chronic Lovenox therapy status post IVC filter. 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 One of the newer direct antithrombin agents or antiXa inhibitors may be used once approved by the FDA. Problem # 3: SLEEP APNEA, OBSTRUCTIVE (ICD-327.23) Assessment: Comment Only Problem # 4: POSTPHLEBITIC SYNDROME (ICD-459.10) Assessment: Comment Only Problem # 5: CORONARY ARTERY DISEASE (ICD-414.00) the patient reports no recurrent chest pain. His blood pressures now also well controlled with a combination of labetalol and chlorthalidone. His updated medication list for this problem includes: Lisinopril 20 Mg Tabs (Lisinopril) .Marland Kitchen... Take one tablet by mouth daily Nitroglycerin 0.4 Mg Subl (Nitroglycerin) ..... One tablet under tongue every 5 minutes as needed for chest pain---may repeat times three Lovenox 120 Mg/0.73ml Soln (Enoxaparin sodium) ..... Inject sub q two times a day Labetalol Hcl 200 Mg Tabs (Labetalol hcl) .Marland Kitchen... Take 1 tablet by mouth two times a day (place on file) Imdur 30 Mg Xr24h-tab (Isosorbide mononitrate) .Marland Kitchen... Take 1 tablet by mouth once a day Patient Instructions: 1) Your physician recommends that you continue on your current medications as directed. Please refer to the Current Medication list given to you today. 2) Follow up in 6 months Signed by Lewayne Bunting, MD, [facility] on 03/20/2010 at 10:38 AM ________________________________________________________________________

## 2011-04-05 NOTE — Telephone Encounter (Signed)
Auth # [id number] exp 05/20/11

## 2011-04-08 ENCOUNTER — Encounter: Payer: Medicare Other | Admitting: Thoracic Surgery (Cardiothoracic Vascular Surgery)

## 2011-04-08 LAB — POCT I-STAT 3, VENOUS BLOOD GAS (G3P V)
Acid-Base Excess: 1
Bicarbonate: 26.6 — ABNORMAL HIGH
O2 Saturation: 72
Operator id: 194801
TCO2: 28
pCO2, Ven: 45.5
pH, Ven: 7.374 — ABNORMAL HIGH
pO2, Ven: 39

## 2011-04-08 NOTE — Progress Notes (Signed)
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  Details of the findings are reported in the procedure dictation.  I have discussed the treatment plan with Dr.  [name]. 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  The plan is to proceed after cardiac catheterization with tissue aortic valve replacement and mitral valve repair and discontinuation of subcutaneous low molecular weight heparin and switch to p.o. dabigatran or rivaroxiban  to reduce the risk of future endocarditis with the use of subcutaneous anticoagulant injections. This his will be an off label use of one of the newer oral anticoagulants.Because of the off label use, a mechanical valve replacemen tis not felt safe due to increased risk of thrombosis with the newer anticoagulant.  The patient will be electively admitted for right heart catheterization, aortic root arteriogram and coronaries ( LV gram can be avoided).  The right heart catheterization will need to be performed for internal jugular vein or subclavian vein due to the presence of an inferior vena cava filter.  Left heart catheterization can be performed from groin or the arm per operator preference.  Following issues need to be taken care off and have been carefully documented in the orders prior to cardiac catheterization;  #1.  The patient will take his last dose off fondaparinux today prior to  the catheterization and we will be held the day of the procedure.  Because the patient did not receive half dosing today prior to the procedure, an antiXa level will be obtained the day of the procedure. #2. Metformin will need to be held the day of the procedure in 48 hours thereafter, sliding scale insulin can be instituted. #3. Dr. [name] will be notified after the procedure with results and the patient will be admitted to proceed to elective aortic valve replacement and mitral valve repair.  Patient has no prior coronary artery disease in 2011 and hopefully will not need coronary bypass grafting.  I have called back to Dr. Bonnee Quin to relate details of the procedure.

## 2011-04-09 ENCOUNTER — Inpatient Hospital Stay (HOSPITAL_COMMUNITY): Payer: Medicare Other

## 2011-04-09 ENCOUNTER — Inpatient Hospital Stay (HOSPITAL_COMMUNITY)
Admission: RE | Admit: 2011-04-09 | Discharge: 2011-04-14 | DRG: 217 | Disposition: A | Discharge status: Home / Self Care | Payer: Medicare Other | Source: Ambulatory Visit | Attending: Thoracic Surgery (Cardiothoracic Vascular Surgery) | Admitting: Thoracic Surgery (Cardiothoracic Vascular Surgery)

## 2011-04-09 ENCOUNTER — Ambulatory Visit (HOSPITAL_COMMUNITY): Payer: Medicare Other

## 2011-04-09 DIAGNOSIS — F329 Major depressive disorder, single episode, unspecified: Secondary | ICD-10-CM | POA: Diagnosis present

## 2011-04-09 DIAGNOSIS — E039 Hypothyroidism, unspecified: Secondary | ICD-10-CM | POA: Diagnosis present

## 2011-04-09 DIAGNOSIS — F3289 Other specified depressive episodes: Secondary | ICD-10-CM | POA: Diagnosis present

## 2011-04-09 DIAGNOSIS — I080 Rheumatic disorders of both mitral and aortic valves: Principal | ICD-10-CM | POA: Diagnosis present

## 2011-04-09 DIAGNOSIS — I059 Rheumatic mitral valve disease, unspecified: Secondary | ICD-10-CM

## 2011-04-09 DIAGNOSIS — E785 Hyperlipidemia, unspecified: Secondary | ICD-10-CM | POA: Diagnosis present

## 2011-04-09 DIAGNOSIS — G4733 Obstructive sleep apnea (adult) (pediatric): Secondary | ICD-10-CM | POA: Diagnosis present

## 2011-04-09 DIAGNOSIS — R0989 Other specified symptoms and signs involving the circulatory and respiratory systems: Secondary | ICD-10-CM

## 2011-04-09 DIAGNOSIS — Z7982 Long term (current) use of aspirin: Secondary | ICD-10-CM

## 2011-04-09 DIAGNOSIS — Z87891 Personal history of nicotine dependence: Secondary | ICD-10-CM

## 2011-04-09 DIAGNOSIS — I10 Essential (primary) hypertension: Secondary | ICD-10-CM | POA: Diagnosis present

## 2011-04-09 DIAGNOSIS — D62 Acute posthemorrhagic anemia: Secondary | ICD-10-CM | POA: Diagnosis not present

## 2011-04-09 DIAGNOSIS — I359 Nonrheumatic aortic valve disorder, unspecified: Secondary | ICD-10-CM

## 2011-04-09 LAB — BASIC METABOLIC PANEL
CO2: 26 mEq/L (ref 19–32)
Glucose, Bld: 115 mg/dL — ABNORMAL HIGH (ref 70–99)
Potassium: 4.8 mEq/L (ref 3.5–5.1)
Sodium: 140 mEq/L (ref 135–145)

## 2011-04-09 LAB — CBC
Hemoglobin: 9.6 g/dL — ABNORMAL LOW (ref 13.0–17.0)
MCH: 24.2 pg — ABNORMAL LOW (ref 26.0–34.0)
MCHC: 30.7 g/dL (ref 30.0–36.0)
MCV: 78.8 fL (ref 78.0–100.0)

## 2011-04-09 LAB — BLOOD GAS, ARTERIAL
Bicarbonate: 24.4 mEq/L — ABNORMAL HIGH (ref 20.0–24.0)
FIO2: 0.21 %
pH, Arterial: 7.404 (ref 7.350–7.450)
pO2, Arterial: 77.2 mmHg — ABNORMAL LOW (ref 80.0–100.0)

## 2011-04-09 LAB — COMPREHENSIVE METABOLIC PANEL
ALT: 32 U/L (ref 0–53)
AST: 24 U/L (ref 0–37)
Albumin: 3.5 g/dL (ref 3.5–5.2)
CO2: 25 mEq/L (ref 19–32)
Calcium: 9 mg/dL (ref 8.4–10.5)
GFR calc non Af Amer: 71 mL/min — ABNORMAL LOW (ref >90)
Sodium: 138 mEq/L (ref 135–145)
Total Protein: 6.9 g/dL (ref 6.0–8.3)

## 2011-04-09 LAB — TYPE AND SCREEN

## 2011-04-09 LAB — POCT I-STAT 3, VENOUS BLOOD GAS (G3P V)
Bicarbonate: 24.5 mEq/L — ABNORMAL HIGH (ref 20.0–24.0)
Bicarbonate: 25 mEq/L — ABNORMAL HIGH (ref 20.0–24.0)
TCO2: 26 mmol/L (ref 0–100)
pCO2, Ven: 44.1 mmHg — ABNORMAL LOW (ref 45.0–50.0)
pH, Ven: 7.336 — ABNORMAL HIGH (ref 7.250–7.300)
pH, Ven: 7.362 — ABNORMAL HIGH (ref 7.250–7.300)
pO2, Ven: 40 mmHg (ref 30.0–45.0)

## 2011-04-09 LAB — GLUCOSE, CAPILLARY
Glucose-Capillary: 119 mg/dL — ABNORMAL HIGH (ref 70–99)
Glucose-Capillary: 103 mg/dL — ABNORMAL HIGH (ref 70–99)

## 2011-04-09 LAB — ABO/RH: ABO/RH(D): A POS

## 2011-04-09 LAB — URINALYSIS, DIPSTICK ONLY
Glucose, UA: NEGATIVE mg/dL
Leukocytes, UA: NEGATIVE
Specific Gravity, Urine: >1.046 — ABNORMAL HIGH (ref 1.005–1.030)
pH: 7 (ref 5.0–8.0)

## 2011-04-09 LAB — APTT: aPTT: 31 seconds (ref 24–37)

## 2011-04-10 ENCOUNTER — Inpatient Hospital Stay (HOSPITAL_COMMUNITY): Payer: Medicare Other

## 2011-04-10 ENCOUNTER — Other Ambulatory Visit: Payer: Self-pay | Admitting: Thoracic Surgery (Cardiothoracic Vascular Surgery)

## 2011-04-10 DIAGNOSIS — Z9889 Other specified postprocedural states: Secondary | ICD-10-CM | POA: Insufficient documentation

## 2011-04-10 DIAGNOSIS — I059 Rheumatic mitral valve disease, unspecified: Secondary | ICD-10-CM

## 2011-04-10 DIAGNOSIS — I359 Nonrheumatic aortic valve disorder, unspecified: Secondary | ICD-10-CM

## 2011-04-10 DIAGNOSIS — Z952 Presence of prosthetic heart valve: Secondary | ICD-10-CM | POA: Insufficient documentation

## 2011-04-10 HISTORY — PX: AORTIC VALVE REPLACEMENT: SHX41

## 2011-04-10 HISTORY — PX: MITRAL VALVE REPAIR: SHX2039

## 2011-04-10 LAB — POCT I-STAT 4, (NA,K, GLUC, HGB,HCT)
Glucose, Bld: 128 mg/dL — ABNORMAL HIGH (ref 70–99)
Glucose, Bld: 127 mg/dL — ABNORMAL HIGH (ref 70–99)
Glucose, Bld: 186 mg/dL — ABNORMAL HIGH (ref 70–99)
Glucose, Bld: 163 mg/dL — ABNORMAL HIGH (ref 70–99)
HCT: 24 % — ABNORMAL LOW (ref 39.0–52.0)
HCT: 27 % — ABNORMAL LOW (ref 39.0–52.0)
HCT: 23 % — ABNORMAL LOW (ref 39.0–52.0)
HCT: 26 % — ABNORMAL LOW (ref 39.0–52.0)
Hemoglobin: 9.2 g/dL — ABNORMAL LOW (ref 13.0–17.0)
Hemoglobin: 7.1 g/dL — ABNORMAL LOW (ref 13.0–17.0)
Potassium: 4.4 mEq/L (ref 3.5–5.1)
Potassium: 4.6 mEq/L (ref 3.5–5.1)
Potassium: 7.2 mEq/L (ref 3.5–5.1)
Potassium: 5.9 mEq/L — ABNORMAL HIGH (ref 3.5–5.1)
Sodium: 135 mEq/L (ref 135–145)
Sodium: 132 mEq/L — ABNORMAL LOW (ref 135–145)
Sodium: 133 mEq/L — ABNORMAL LOW (ref 135–145)
Sodium: 137 mEq/L (ref 135–145)
Sodium: 139 mEq/L (ref 135–145)

## 2011-04-10 LAB — CREATININE, SERUM
GFR calc Af Amer: >90 mL/min (ref >90)
GFR calc non Af Amer: 90 mL/min — ABNORMAL LOW (ref >90)

## 2011-04-10 LAB — POCT I-STAT 3, ART BLOOD GAS (G3+)
Acid-base deficit: 3 mmol/L — ABNORMAL HIGH (ref 0.0–2.0)
Acid-base deficit: 5 mmol/L — ABNORMAL HIGH (ref 0.0–2.0)
Acid-base deficit: 2 mmol/L (ref 0.0–2.0)
Acid-base deficit: 2 mmol/L (ref 0.0–2.0)
Bicarbonate: 25.2 mEq/L — ABNORMAL HIGH (ref 20.0–24.0)
Bicarbonate: 22.3 mEq/L (ref 20.0–24.0)
Bicarbonate: 24.2 mEq/L — ABNORMAL HIGH (ref 20.0–24.0)
Bicarbonate: 22.9 mEq/L (ref 20.0–24.0)
O2 Saturation: 100 %
O2 Saturation: 100 %
O2 Saturation: 91 %
O2 Saturation: 96 %
O2 Saturation: 93 %
TCO2: 26 mmol/L (ref 0–100)
TCO2: 24 mmol/L (ref 0–100)
pCO2 arterial: 44.1 mmHg (ref 35.0–45.0)
pCO2 arterial: 38.7 mmHg (ref 35.0–45.0)
pCO2 arterial: 46.3 mmHg — ABNORMAL HIGH (ref 35.0–45.0)
pCO2 arterial: 42.6 mmHg (ref 35.0–45.0)
pCO2 arterial: 38.9 mmHg (ref 35.0–45.0)
pO2, Arterial: 63 mmHg — ABNORMAL LOW (ref 80.0–100.0)
pO2, Arterial: 81 mmHg (ref 80.0–100.0)
pO2, Arterial: 69 mmHg — ABNORMAL LOW (ref 80.0–100.0)

## 2011-04-10 LAB — GRAM STAIN

## 2011-04-10 LAB — CBC
HCT: 32 % — ABNORMAL LOW (ref 39.0–52.0)
HCT: 31.1 % — ABNORMAL LOW (ref 39.0–52.0)
Hemoglobin: 9.5 g/dL — ABNORMAL LOW (ref 13.0–17.0)
MCH: 24 pg — ABNORMAL LOW (ref 26.0–34.0)
MCHC: 30.3 g/dL (ref 30.0–36.0)
MCV: 79 fL (ref 78.0–100.0)
MCV: 78.1 fL (ref 78.0–100.0)
Platelets: 133 10*3/uL — ABNORMAL LOW (ref 150–400)
RBC: 3.71 MIL/uL — ABNORMAL LOW (ref 4.22–5.81)
RDW: 15.5 % (ref 11.5–15.5)
RDW: 15.4 % (ref 11.5–15.5)
WBC: 13 10*3/uL — ABNORMAL HIGH (ref 4.0–10.5)
WBC: 8.8 10*3/uL (ref 4.0–10.5)

## 2011-04-10 LAB — POCT I-STAT GLUCOSE
Glucose, Bld: 137 mg/dL — ABNORMAL HIGH (ref 70–99)
Operator id: 3342

## 2011-04-10 LAB — POCT I-STAT, CHEM 8
BUN: 13 mg/dL (ref 6–23)
Chloride: 107 mEq/L (ref 96–112)
HCT: 27 % — ABNORMAL LOW (ref 39.0–52.0)
Potassium: 4 mEq/L (ref 3.5–5.1)

## 2011-04-10 LAB — APTT: aPTT: 32 seconds (ref 24–37)

## 2011-04-10 LAB — MAGNESIUM: Magnesium: 2.8 mg/dL — ABNORMAL HIGH (ref 1.5–2.5)

## 2011-04-10 LAB — BASIC METABOLIC PANEL
BUN: 16 mg/dL (ref 6–23)
Creatinine, Ser: 1.19 mg/dL (ref 0.50–1.35)
GFR calc Af Amer: 74 mL/min — ABNORMAL LOW (ref >90)
GFR calc non Af Amer: 64 mL/min — ABNORMAL LOW (ref >90)
Glucose, Bld: 123 mg/dL — ABNORMAL HIGH (ref 70–99)

## 2011-04-10 LAB — PROTIME-INR
INR: 1.42 (ref 0.00–1.49)
Prothrombin Time: 17.6 seconds — ABNORMAL HIGH (ref 11.6–15.2)

## 2011-04-11 ENCOUNTER — Other Ambulatory Visit: Payer: Self-pay | Admitting: Cardiovascular Disease

## 2011-04-11 ENCOUNTER — Inpatient Hospital Stay (HOSPITAL_COMMUNITY): Payer: Medicare Other

## 2011-04-11 LAB — BASIC METABOLIC PANEL
CO2: 22 mEq/L (ref 19–32)
Calcium: 8 mg/dL — ABNORMAL LOW (ref 8.4–10.5)
Chloride: 108 mEq/L (ref 96–112)
Potassium: 3.9 mEq/L (ref 3.5–5.1)
Sodium: 139 mEq/L (ref 135–145)

## 2011-04-11 LAB — GLUCOSE, CAPILLARY
Glucose-Capillary: 102 mg/dL — ABNORMAL HIGH (ref 70–99)
Glucose-Capillary: 114 mg/dL — ABNORMAL HIGH (ref 70–99)
Glucose-Capillary: 115 mg/dL — ABNORMAL HIGH (ref 70–99)
Glucose-Capillary: 120 mg/dL — ABNORMAL HIGH (ref 70–99)
Glucose-Capillary: 132 mg/dL — ABNORMAL HIGH (ref 70–99)
Glucose-Capillary: 125 mg/dL — ABNORMAL HIGH (ref 70–99)
Glucose-Capillary: 132 mg/dL — ABNORMAL HIGH (ref 70–99)
Glucose-Capillary: 136 mg/dL — ABNORMAL HIGH (ref 70–99)
Glucose-Capillary: 124 mg/dL — ABNORMAL HIGH (ref 70–99)

## 2011-04-11 LAB — MAGNESIUM: Magnesium: 2.4 mg/dL (ref 1.5–2.5)

## 2011-04-11 LAB — POCT I-STAT, CHEM 8
BUN: 14 mg/dL (ref 6–23)
Calcium, Ion: 1.13 mmol/L (ref 1.12–1.32)
Hemoglobin: 9.9 g/dL — ABNORMAL LOW (ref 13.0–17.0)
TCO2: 23 mmol/L (ref 0–100)

## 2011-04-11 LAB — CBC
Hemoglobin: 9 g/dL — ABNORMAL LOW (ref 13.0–17.0)
MCH: 23.7 pg — ABNORMAL LOW (ref 26.0–34.0)
MCV: 78.2 fL (ref 78.0–100.0)
Platelets: 151 10*3/uL (ref 150–400)
Platelets: 145 10*3/uL — ABNORMAL LOW (ref 150–400)
RBC: 3.79 MIL/uL — ABNORMAL LOW (ref 4.22–5.81)
RDW: 15.8 % — ABNORMAL HIGH (ref 11.5–15.5)
WBC: 12.7 10*3/uL — ABNORMAL HIGH (ref 4.0–10.5)
WBC: 15.1 10*3/uL — ABNORMAL HIGH (ref 4.0–10.5)

## 2011-04-11 LAB — CREATININE, SERUM: GFR calc Af Amer: 86 mL/min — ABNORMAL LOW (ref >90)

## 2011-04-11 NOTE — Op Note (Signed)
NAMEMarland [name]  [name], [name] NO.:  [id number]  MEDICAL RECORD NO.:  [id number]  LOCATION:  2314                         FACILITY:  [facility]  PHYSICIAN:  Salvatore Decent. [name], M.D. DATE OF BIRTH:  1950/07/25  DATE OF PROCEDURE:  04/10/2011 DATE OF DISCHARGE:                              OPERATIVE REPORT   PREOPERATIVE DIAGNOSES: 1. Severe aortic insufficiency. 2. Moderate mitral regurgitation.  POSTOPERATIVE DIAGNOSES: 1. Severe aortic insufficiency. 2. Moderate mitral regurgitation.  PROCEDURE:  Median sternotomy for aortic valve replacement (25 mm [facility] Ease pericardial tissue valve) and mitral valve repair (complex valvuloplasty including autologous pericardial patch repair of posterior leaflet with plication of posterior commissure and 28 mm Memo 3D ring annuloplasty) and placement of left subclavian central venous line and right heart catheterization (Swan-Ganz catheter placement).  SURGEON:  Salvatore Decent. [name], MD  ASSISTANT:  Mr. Al Corpus, SA  ANESTHESIOLOGIST:  Zenon Mayo, MD  BRIEF CLINICAL NOTE:  The patient is a 60 year old morbidly obese white male who has had multiple episodes of bacterial endocarditis.  The patient has developed worsening aortic insufficiency and mitral regurgitation with congestive heart failure.  OTHER MEDICAL PROBLEMS:  Hypertension, type 2 diabetes mellitus, hyperlipidemia, hypothyroidism, GE reflux disease, and some type of poorly defined hypercoagulable state with history of recurrent deep venous thrombosis.  The patient presents with worsening symptoms of congestive heart failure.  Both transthoracic and transesophageal echocardiograms demonstrate the presence of severe aortic insufficiency with at least moderate mitral regurgitation.  The patient was admitted to the hospital and underwent left and right heart catheterization on April 09, 2011.  The patient was found to have diffuse plaque throughout the  coronary arteries but no significant flow-limiting coronary artery atherosclerotic disease.  The patient had moderate pulmonary hypertension.  A full consultation note has been dictated previously.  The patient and his family have been counseled regarding the indications, risks, and potential benefits of surgery.  Alternative treatment strategies have been discussed.  The somewhat high risks associated with surgery have been discussed in detail and associated long-term prognosis with nonsurgical intervention has been contrasted. Alternative surgical approaches have been discussed and after considerable discussion, the patient specifically requests that his aortic valve be replaced using a bioprosthetic tissue valve.  The patient has been intolerant of Coumadin in the past.  The patient and his family understand and accept all potential associated risks of surgery including but not limited to risk of death, stroke, myocardial infarction, congestive heart failure, respiratory failure, renal failure, bleeding requiring blood transfusion, arrhythmia, heart block or bradycardia requiring permanent pacemaker, pulmonary embolus, late structural valve deterioration, and failure of bioprosthetic tissue valve, possible need for valve replacement if mitral valve cannot be satisfactorily repaired.  All of the questions have been addressed.  OPERATIVE FINDINGS: 1. Large perforation in the left coronary leaflet of the aortic valve     with severe aortic insufficiency. 2. Small perforation in the posterior leaflet of the mitral valve with     type I mitral valve dysfunction and at least moderate (3+) mitral     regurgitation. 3. Mild-to-moderate left ventricular dysfunction with global     hypokinesis. 4. No residual mitral  regurgitation following successful mitral valve     repair.  OPERATIVE NOTE IN DETAIL:  The patient was brought to the operating room on the above-mentioned date and placed in  the supine position on the operating table.  A radial arterial line was placed by the anesthesia team under the care and direction of Dr. Autumn Patty.  Attempts to place a Swan-Ganz catheter unsuccessful.  General endotracheal anesthesia was induced uneventfully.  A Foley catheter was placed. Baseline transesophageal echocardiogram was performed by Dr. Sampson Goon. This confirmed the presence of severe aortic insufficiency.  The aortic valve was tricuspid and leaflet mobility appeared normal.  There may be a perforation in the left coronary leaflet.  The left ventricle was dilated.  There was at least moderate (3+) mitral regurgitation.  There appears to be normal leaflet motion of the mitral valve.  No other specific abnormalities were noted.  The patient's left anterior chest was prepared and draped in a sterile manner.  The left subclavian vein was cannulated with Seldinger technique and a Cordis introducing sheath was advanced uneventfully. The Swan-Ganz catheter was then placed through the Cordis introducing sheath and passed into the proximal pulmonary artery while continuously measuring pressure waveform.  The Swan-Ganz catheter was utilized for continuous hemodynamic monitoring throughout the remainder of the operation and during the early postoperative recovery period.  The patient's chest, abdomen, both groins, and both lower extremities were prepared and draped in a sterile manner.  The median sternotomy incision was performed.  The pericardium was opened.  The patient was heparinized systemically.  The ascending aorta was normal in appearance. The distal ascending aorta was cannulated for cardiopulmonary bypass.  A single stage venous cannula was placed through the right atrium.  A retrograde cardioplegic cannula was placed through the right atrium into the coronary sinus.  Cardiopulmonary bypass was begun.  A second venous cannula was placed directly in the superior  vena cava.  An antegrade cardioplegic cannula was placed low in the proximal ascending aorta.  A temperature probe was placed in the left ventricular septum.  The patient was cooled to 32 degrees systemic temperature.  The aortic crossclamp was applied and cold blood cardioplegia was delivered initially in antegrade fashion through the aortic root.  Once the patient developed ventricular fibrillation, cardioplegia was switched to retrograde through the coronary sinus catheter.  Large arresting dose of cardioplegia was administered.  Septal myocardial cooling was noted to drop rapidly and the patient developed rapid diastolic arrest.  Repeat doses of cardioplegia were administered intermittently throughout the entire crossclamp portion of the operation retrograde through the coronary sinus catheter to maintain completely flat electrocardiogram and left ventricular septal myocardial temperature below 15 degree centigrade.  The left atriotomy incision was performed posteriorly through the interatrial groove and continued partway across the back wall of the left atrium after opening the oblique sinus inferiorly.  The floor of the left atrium and the mitral valve were now exposed using a self- retaining retractor.  Exposure of the mitral valve was difficult but satisfactory due to the patient's body habitus and extremely large heart.  The mitral valve was carefully examined.  There was a small hole in the middle scalp (P2) of the posterior leaflet.  There was no sign of active vegetation.  The remainder of the leaflets appeared fairly normal with only mild sclerosis and calcification in the posterior anulus. There was no evidence for any mitral valve prolapse.  No any other significant pathology.  The pathology appears to be  type 1 dysfunction with a combination of annular dilatation as well as a small perforation in the posterior leaflet.  Interrupted 2-0 Ethibond horizontal mattress  sutures were placed circumferentially around the entire mitral valve anulus.  The small hole in the posterior leaflet was closed with a small patch of autologous pericardium that is tanned in 0.625% Glutaraldehyde solution.  The patch was closed by applying interrupted CV5 Gore-Tex sutures to close the hole.  The posterior commissure was also closed with a single everting Magic suture with CV5 Gore-Tex.  The valve was sized to accept a 28 mm annuloplasty ring which corresponds fairly closely to the overall dimensions of the anterior leaflet.  Sorin Memo 3D annuloplasty ring (size 28 mm, catalog number [id number], serial [id number]) is secured in place uneventfully.  The valve was not tested with saline and appears to be competent with no sign of residual mitral regurgitation.  There is a broad symmetrical line of coaptation of the anterior and posterior leaflets.  Sump drain was placed across the mitral valve to serve as a left ventricular vent.  The left atriotomy incision was closed posteriorly with a 2 layer closure of running 3-0 Prolene suture.  An oblique aortotomy incision was performed.  The aortic valve was carefully inspected.  There was a large perforation in the base of the left coronary leaflet of the aortic valve.  This perforation extends to the free margin, and as such an attempt at repair seems to be unwise. There also appears to be an old vegetation.  Wound swab culture of the vegetation was sent for stat Gram stain and routine culture sensitivity. The aortic valve leaflets were excised sharply.  The aortic anulus was irrigated with copious saline solution.  The aortic anulus was sized to accept a 25 mm stented bioprosthetic tissue valve.  Aortic valve replacement was performed using interrupted 2-0 Ethibond horizontal mattress pledgeted sutures with pledgets in the submandibular position.  An [facility] Ease pericardial tissue valve (size 25 mm, model #3300TFX, serial  [id number]) is secured in place uneventfully.  The valve seats nicely and without difficulty.  Rewarming was begun.  The aortotomy was closed with 2 layer closure of running 4-0 Prolene suture.  The lungs were ventilated and the heart allowed to fill while 1 final dose of warm retrograde hot shot cardioplegia was administered. The aortic crossclamp was removed after a total crossclamp time of 149 minutes.  The heart began to beat spontaneously without need for cardioversion. Epicardial pacing wires were fixed to the right ventricular free wall into the right atrial appendage.  The patient was rewarmed to 37 degrees centigrade temperature.  The retrograde cardioplegic cannula was removed.  The lungs were ventilated and the heart allowed to fill after which time the left ventricular vent was removed.  The patient was weaned from cardiopulmonary bypass without difficulty.  The patient's rhythm at separation from bypass was AV paced.  Total cardiopulmonary bypass time for the operation was 184 minutes.  Follow up transesophageal echocardiogram performed by Dr. Sampson Goon after separation from bypass demonstrates a well-seated bioprosthetic tissue valve in the aortic position that was functioning normally. There was no aortic insufficiency.  The mitral valve repair appears intact.  There was a well-seated annuloplasty ring in the mitral position.  The mitral valve was functioning normally and there was trivial residual mitral regurgitation.  Mean gradient across the mitral valve was estimated 6 mmHg.  The venous and arterial cannulae were removed uneventfully.  Protamine was administered to reverse  the anticoagulation.  The mediastinum was irrigated with saline solution.  Meticulous surgical hemostasis was ascertained.  The mediastinum was drained with 2 chest tubes placed through separate stab incisions inferiorly.  The soft tissues anterior to the aorta were reapproximated loosely.  The  sternum was closed using double-strength sternal wire.  The soft tissues anterior to the sternum were closed in multiple layers and the skin was closed using a running subcuticular skin closure.  The patient tolerated the procedure well and was transported to the Surgical Intensive Care Unit in stable condition.  There were no intraoperative complications.  All sponge, instrument, and needle counts were verified correct at completion the operation.  No blood products were administered.     Salvatore Decent. [name], M.D.     CHO/MEDQ  D:  04/10/2011  T:  04/11/2011  Job:  [id number]  cc:   Learta Codding, MD,FACC Wyvonnia Lora, MD  Electronically Signed by Tressie Stalker M.D. on 04/11/2011 07:45:45 AM

## 2011-04-12 ENCOUNTER — Inpatient Hospital Stay (HOSPITAL_COMMUNITY): Payer: Medicare Other

## 2011-04-12 LAB — GLUCOSE, CAPILLARY
Glucose-Capillary: 130 mg/dL — ABNORMAL HIGH (ref 70–99)
Glucose-Capillary: 117 mg/dL — ABNORMAL HIGH (ref 70–99)
Glucose-Capillary: 150 mg/dL — ABNORMAL HIGH (ref 70–99)
Glucose-Capillary: 137 mg/dL — ABNORMAL HIGH (ref 70–99)

## 2011-04-12 LAB — BASIC METABOLIC PANEL
CO2: 26 mEq/L (ref 19–32)
Calcium: 8.6 mg/dL (ref 8.4–10.5)
GFR calc non Af Amer: 64 mL/min — ABNORMAL LOW (ref >90)
Sodium: 134 mEq/L — ABNORMAL LOW (ref 135–145)

## 2011-04-12 LAB — CBC
Hemoglobin: 9.2 g/dL — ABNORMAL LOW (ref 13.0–17.0)
MCHC: 30.9 g/dL (ref 30.0–36.0)
RDW: 16 % — ABNORMAL HIGH (ref 11.5–15.5)

## 2011-04-13 ENCOUNTER — Inpatient Hospital Stay (HOSPITAL_COMMUNITY): Payer: Medicare Other

## 2011-04-13 LAB — WOUND CULTURE: Culture: NO GROWTH

## 2011-04-13 LAB — CBC
Hemoglobin: 8.7 g/dL — ABNORMAL LOW (ref 13.0–17.0)
RBC: 3.64 MIL/uL — ABNORMAL LOW (ref 4.22–5.81)
WBC: 11.6 10*3/uL — ABNORMAL HIGH (ref 4.0–10.5)

## 2011-04-13 LAB — GLUCOSE, CAPILLARY
Glucose-Capillary: 92 mg/dL (ref 70–99)
Glucose-Capillary: 85 mg/dL (ref 70–99)

## 2011-04-13 LAB — BASIC METABOLIC PANEL
CO2: 26 mEq/L (ref 19–32)
GFR calc non Af Amer: 65 mL/min — ABNORMAL LOW (ref >90)
Glucose, Bld: 88 mg/dL (ref 70–99)
Potassium: 3.7 mEq/L (ref 3.5–5.1)
Sodium: 136 mEq/L (ref 135–145)

## 2011-04-14 LAB — GLUCOSE, CAPILLARY
Glucose-Capillary: 88 mg/dL (ref 70–99)
Glucose-Capillary: 86 mg/dL (ref 70–99)

## 2011-04-15 ENCOUNTER — Encounter: Payer: Self-pay | Admitting: *Deleted

## 2011-04-16 ENCOUNTER — Telehealth: Payer: Self-pay | Admitting: *Deleted

## 2011-04-16 DIAGNOSIS — R0602 Shortness of breath: Secondary | ICD-10-CM

## 2011-04-16 NOTE — Telephone Encounter (Signed)
Pt was discharged from [facility] following AV replacement and MV repair on 10/21. Pt was d/c'd on Xarelto 15 mg. He called today stating he cannot afford this medication. His wife spent the entire day today on the phone with the [facility] to try to get it approved. She states we need to call them at [phone] to approve this.  Spoke with someone at [employer] who states Dr. Orvan July office denied this medication. Because they denied it, we cannot open a new PA. We will need to appeal the denial. She will fax paperwork to our office for the appeal. We need to complete the paperwork and send any records for justify Xarelto.  Mr. Pruett notified. He does not have any medication to take today and missed this yesterday also.  Called Dr. Earnestine Leys who states pt can take 20 mg based off most recent labs. We have samples of this in the office. Dr. Earnestine Leys will take this to the pt this evening as he cannot come to the office before closing. He will also write a letter to send with the appeal tomorrow.  Pt is aware that Dr. Earnestine Leys will bring samples to pt's home this evening and we will send appeal tomorrow.

## 2011-04-17 ENCOUNTER — Encounter: Payer: Self-pay | Admitting: Cardiology

## 2011-04-17 MED ORDER — RIVAROXABAN 20 MG PO TABS: 20.0000 mg | ORAL_TABLET | Freq: Every day | ORAL | Status: DC

## 2011-04-17 NOTE — Telephone Encounter (Signed)
Medications delivered to patient last night- please sent me reminder to write letter to [facility] today

## 2011-04-17 NOTE — Telephone Encounter (Signed)
Appeal faxed to [facility]. New RX for [facility]arelto called [facility]. Pt is aware of this and to have BMET done in Nov, Feb and May for f/u of Renal function on Xarelto.

## 2011-04-17 NOTE — Telephone Encounter (Signed)
Addended by: Arlyss Gandy on: 04/17/2011 11:32 AM   Modules accepted: Orders

## 2011-04-17 NOTE — Discharge Summary (Signed)
NAMEMarland Kitchen  [name] [name], [name] [name] NO.:  [id number]  MEDICAL RECORD NO.:  [id number]  LOCATION:  2016                         FACILITY:  [facility]  PHYSICIAN:  Salvatore Decent. [name], M.D. DATE OF BIRTH:  [birth year]  DATE OF ADMISSION:  04/09/2011 DATE OF DISCHARGE:  04/14/2011                              DISCHARGE SUMMARY   HISTORY:  The patient is a 60 year old morbidly obese white male from [place], [place] with a history of multiple episodes of bacterial endocarditis resulting in aortic insufficiency and mitral regurgitation. The patient has multiple other medical problems including hypertension, type 2 diabetes mellitus, hyperlipidemia, hypothyroidism, GE reflux disease, and some type of hypercoagulopathy state with recurrent deep venous thrombosis.  The patient states that he first began having lower extremity deep venous thrombosis in 1993.  He initially had attempted to be managed on long-term Coumadin therapy, but he continued to have deep venous thrombosis on Coumadin therapy due to complete inability to maintain therapeutic state.  Ultimately, he was converted to long-term injection to his fondaparinux.  In June 2011, he was hospitalized with bacterial endocarditis.  Details of that hospitalization admission are not currently available, but the patient was noted to have a transesophageal echocardiogram with evidence of mitral valve endocarditis.  The patient was again hospitalized in June 2012 for presumed endocarditis with the development of moderate to severe aortic insufficiency.  Over the last year and a half, he has developed worsening symptoms of chronic diastolic congestive heart failure.  He was seen in followup by Dr. Lewayne Bunting and a repeat transesophageal echocardiogram was performed.  This revealed worsening aortic insufficiency and mitral regurgitation.  He was referred to Tressie Stalker, MD for surgical consideration for aortic valve replacement  and mitral valve repair versus replacement.  Dr. [name] evaluated the patient and his studies and agreed with recommendations to proceed.  He was hospitalized this admission for the procedure.  PAST MEDICAL HISTORY: 1. Obesity. 2. Hypertension. 3. Chest pain. 4. Obstructive sleep apnea. 5. Depression. 6. Postphlebitic syndrome. 7. Endocarditis. 8. Dyspnea. 9. History of DVT. 10.Diabetes mellitus. 11.Hyperlipidemia. 12.Coronary artery disease, mild. 13.Aortic insufficiency. 14.Mitral regurgitation.  PAST SURGICAL HISTORY:  Previous thyroidectomy, previous cardiac catheterization x2.  FAMILY HISTORY:  Remarkable for coronary artery disease.  SOCIAL HISTORY:  Former smoker, 1 pack a day for 6 years, he quit in 1975.  Alcohol use none.  MEDICATIONS PRIOR TO ADMISSION: 1. Klonopin 0.5 mg daily. 2. Cymbalta 60 mg daily. 3. Arixtra 0.8 mL subcu at bedtime. 4. Vicodin 1 tablet every 6 hours p.r.n. 5. Imdur 30 mg daily. 6. Labetalol 100 mg daily. 7. Synthroid 200 mcg daily. 8. Metformin 500 mg 2 times daily with meals. 9. Nitroglycerin p.r.n. 10.Omeprazole 20 mg daily. 11.Onglyza 5 mg daily. 12.Potassium chloride 20 mEq daily. 13.Zocor 40 mg daily. 14.Torsemide 20 mg 2 tablets twice a day.  ALLERGIES:  No known allergies.  REVIEW OF SYMPTOMS AND PHYSICAL EXAMINATION:  Please see the full history and physical.  HOSPITAL COURSE:  The patient was admitted.  He underwent cardiac catheterization on October 16 to better delineate his coronary anatomy. He was found to have noncritical disease.  It was not felt that  he would require surgical coronary revascularization as a part of this procedure.  PROCEDURE:  On April 10, 2011, he underwent the following procedure: 1. Aortic valve replacement with a 25 Carpentier-Edwards pericardial     tissue valve.  Additionally underwent a mitral valve repair with     complex valvuloplasty and 28 Memo ring annuloplasty.  He tolerated      the procedure well, was taken to the Surgical Intensive Care Unit     in stable condition.  Postoperative hospital course, the patient has done well.  He remained hemodynamically stable.  Inotrope's were weaned without difficulty.  He was weaned from the ventilator without difficulty.  All routine lines, monitors, and drainage devices have been discontinued in the standard fashion.  He has had no significant cardiac dysrhythmias.  He does have an acute blood loss anemia, expected, which is stable.  His most recent values show hemoglobin and hematocrit on April 13, 2011, 8.7 and 28.6 respectively.  Electrolytes, BUN, and creatinine are within normal limits.  He has volume overload but is responding well to diuretics.  He is now currently below the level of his preoperative weight.  He has no significant edema.  He has no significant pleural effusions.  Oxygen has been weaned and he maintains good saturations on room air.  He is tolerating activities progression.  He will require a walker at home for safety.  His blood sugars have been under good control.  This has been by standard protocols.  He will resume his preoperative regimen at discharge.  His overall status is deemed to be acceptable for discharge on today's date, April 14, 2011.  HOME INSTRUCTIONS:  The patient will receive written instructions regarding medications, activity, diet, wound care, and followup.  Followup to include Dr. Andee Lineman 2 weeks post discharge, Dr. [name] on May 06, 2011 with a chest x-ray.  He is also instructed to follow up with Dr. Margo Common in regards to his long-term diabetes management.  MEDICATIONS ON DISCHARGE: 1. Aspirin 325 mg daily. 2. Furosemide 40 mg daily for 7 days. 3. Lisinopril 10 mg twice daily. 4. Metoprolol 25 mg twice daily. 5. Oxycodone 5 mg 1-2 every 4-6 hours p.r.n. for pain. 6. Potassium chloride 20 mEq daily for 7 days. 7. Note the patient is being started on Xarelto 15 mg  daily. 8. Cymbalta 60 mg daily. 9. Clonazepam 0.5 mg p.r.n. for sleep. 10.Levothroid 200 mcg daily. 11.Metformin 500 mg twice daily. 12.Onglyza 1 tablet daily. 13.Prilosec 20 mg daily. 14.Simvastatin 40 mg daily.  FINAL DIAGNOSES:  Severe mitral and aortic insufficiency now status post repair and replacement as described above with a 25 mm Edwards Magna Ease pericardial tissue valve aortic replacement and mitral valve repair with complex valvuloplasty including autologous pericardial patch, repair of posterior leaflet with closure of posterior commissure and 28 mm Memo 3D ring annuloplasty.     Rowe Clack, P.A.-C.   ______________________________ Salvatore Decent [name], M.D.    Sherryll Burger  D:  04/14/2011  T:  04/14/2011  Job:  [id number]  cc:   Salvatore Decent. [name], M.D. Learta Codding, MD,FACC Wyvonnia Lora, MD  Electronically Signed by Gershon Crane P.A.-C. on 04/16/2011 02:01:21 PM Electronically Signed by Tressie Stalker M.D. on 04/17/2011 04:07:16 PM

## 2011-04-18 NOTE — Telephone Encounter (Signed)
Received a call from [facility] that Xarelto 20 mg approved for [name] [name] with approval number [id number] from 04/16/11 to 06/23/2012.  [facility]  No answer, no voicemail for home number. Left message on cell phone to notify pt.

## 2011-04-23 NOTE — Telephone Encounter (Signed)
Patient seen in office 03/19/11 and had echo done also.

## 2011-04-24 NOTE — Telephone Encounter (Signed)
Great - thanks

## 2011-04-26 ENCOUNTER — Other Ambulatory Visit: Payer: Self-pay | Admitting: *Deleted

## 2011-04-26 ENCOUNTER — Encounter: Payer: Self-pay | Admitting: *Deleted

## 2011-04-26 DIAGNOSIS — E785 Hyperlipidemia, unspecified: Secondary | ICD-10-CM

## 2011-04-26 MED ORDER — SIMVASTATIN 40 MG PO TABS: 40.0000 mg | ORAL_TABLET | Freq: Every evening | ORAL | Status: DC

## 2011-04-26 NOTE — Telephone Encounter (Signed)
This encounter was created in error - please disregard.

## 2011-04-27 NOTE — Cardiovascular Report (Signed)
NAMEMarland Kitchen  CHANDAN, FLY NO.:  [id number]  MEDICAL RECORD NO.:  [id number]  LOCATION:  2502                         FACILITY:  [facility]  PHYSICIAN:  Arturo Morton. Riley Kill, MD, FACCDATE OF BIRTH:  Jan 06, 1951  DATE OF PROCEDURE:  04/09/2011 DATE OF DISCHARGE:                           CARDIAC CATHETERIZATION   INDICATIONS:  [name] [name] is a 60 year old who has both aortic regurgitation and mitral regurgitation.  He has been evaluated, and felt to be in need of surgery for correction of the valvular abnormalities. He was cath a year and a half ago, and had some coronary plaque.  He has had intermittent chest pain.  The current study was done to assess both his right heart pressures as well as his anatomy.  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  This was taken into account.  PROCEDURE: 1. Right heart catheterization. 2. Placement of catheters without left heart catheterization for     angiography. 3. Aortic root aortography. 4. Coronary arteriography.  DESCRIPTION OF PROCEDURE:  The patient was brought to the catheterization laboratory after informed consent.  His anti 10a level was normal.  Through an anterior puncture using a Smart needle, the femoral vein was entered.  A 0.025 guidewire was then advanced through the filter, and a Swan-Ganz catheter without balloon inflation was advanced through the filter.  This was then taken to the superior vena cava where saturation was obtained.  Following this, serial right heart pressures were recorded.  Thermodilution cardiac outputs and PA saturations were obtained.  Following this, the right heart catheters were removed.  We then used a Smart needle to gain access to the femoral artery.  A 5-French sheath was placed.  A pigtail catheter was placed in the proximal aorta, and aortography performed.  We then used a Williams catheter to engage the right and a  left-4 guiding catheter to engage the left.  There were no major complications.  He was taken to the holding area in satisfactory clinical condition.  I then called Dr. Barry Dienes.  HEMODYNAMIC DATA: 1. Right atrium 20. 2. RV 60/17. 3. Pulmonary artery 61/36, mean 48. 4. Pulmonary capillary wedge 35. 5. Aortic 155/78, mean 114. 6. No LV pressure recorded. 7. Superior vena cava saturation 71%. 8. Pulmonary artery saturation 68%. 9. Aortic saturation 97%. 10.Fick cardiac output 9.0 liters per minute. 11.Fick cardiac index 3.51 liters per minute per meter squared. 12.Thermodilution cardiac output 6.96 liters per minute. 13.Fick cardiac index 2.71 liters per minute per meter squared.  ANGIOGRAPHIC DATA: 1. On aortography, the aortic root appears to be modestly dilated.     There is clearly aortic regurgitation with complete opacification     of the ventricle.  I could not appreciate the mitral regurgitation,     but LV opacification was probably not vigorous enough. 2. There is general calcification of the coronary vessels. 3. The left main is without critical disease, although it is     calcified. 4. Left anterior descending artery courses to the apex.  There is     about 30% narrowing near the takeoff of the septal.  There is     diffuse plaque throughout the mid and distal LAD, but none with     high-grade stenosis that probably represent multiple areas of 30%     narrowing. 5. The ramus intermedius is a very large caliber vessel that     bifurcates distally and is without significant obstruction. 6. The circumflex is a large codominant vessel.  There is probably     about 30-40% ostial narrowing and the vessel courses posteriorly     with it, provides 3 branches including likely echo PDA.  The mid     PDA may have about 50% narrowing. 7. The right coronary artery is a smaller caliber vessel with probably     about 30% narrowing of the proximal vessel.  The posterior     descending  is relatively small with several side branches, but is a     single branch.  CONCLUSIONS: 1. Severe aortic regurgitation by angiography. 2. Elevated pulmonary capillary wedge pressure with prominent V-wave     into the 40s. 3. Pulmonary hypertension likely passive associated with elevated left     ventricular end-diastolic pressure as manifested by the pulmonary     capillary wedge. 4. Scattered coronary plaques without high-grade coronary obstruction.  DISPOSITION:  I spoke with Dr. Barry Dienes.  The patient is tentatively on the schedule for surgery at 8:30 in the morning.  Anticipated treatment will be mitral valve repair with aortic valve replacement with a bioprosthetic valve.  This has been discussed in detail between Dr. Andee Lineman and Dr. Barry Dienes.     Arturo Morton. Riley Kill, MD, [facility]     TDS/MEDQ  D:  04/09/2011  T:  04/10/2011  Job:  [id number]  cc:   Learta Codding, MD,FACC Dr. Fredderick Phenix  Electronically Signed by Shawnie Pons MD [facility] on 04/27/2011 09:14:12 AM

## 2011-04-29 ENCOUNTER — Encounter: Payer: Self-pay | Admitting: Thoracic Surgery (Cardiothoracic Vascular Surgery)

## 2011-04-30 ENCOUNTER — Encounter: Payer: Self-pay | Admitting: Cardiology

## 2011-04-30 ENCOUNTER — Ambulatory Visit (INDEPENDENT_AMBULATORY_CARE_PROVIDER_SITE_OTHER): Payer: Medicare Other | Admitting: Cardiology

## 2011-04-30 VITALS — BP 121/86 | HR 86 | Ht 71.0 in | Wt 304.0 lb

## 2011-04-30 DIAGNOSIS — I359 Nonrheumatic aortic valve disorder, unspecified: Secondary | ICD-10-CM

## 2011-04-30 DIAGNOSIS — I4949 Other premature depolarization: Secondary | ICD-10-CM

## 2011-04-30 DIAGNOSIS — I351 Nonrheumatic aortic (valve) insufficiency: Secondary | ICD-10-CM

## 2011-04-30 DIAGNOSIS — I38 Endocarditis, valve unspecified: Secondary | ICD-10-CM

## 2011-04-30 DIAGNOSIS — I82409 Acute embolism and thrombosis of unspecified deep veins of unspecified lower extremity: Secondary | ICD-10-CM

## 2011-04-30 DIAGNOSIS — I340 Nonrheumatic mitral (valve) insufficiency: Secondary | ICD-10-CM

## 2011-04-30 DIAGNOSIS — I2510 Atherosclerotic heart disease of native coronary artery without angina pectoris: Secondary | ICD-10-CM

## 2011-04-30 DIAGNOSIS — I059 Rheumatic mitral valve disease, unspecified: Secondary | ICD-10-CM

## 2011-04-30 DIAGNOSIS — R0602 Shortness of breath: Secondary | ICD-10-CM

## 2011-04-30 NOTE — Assessment & Plan Note (Signed)
No chest pain. Patient does report sternotomy pain which is well controlled.

## 2011-04-30 NOTE — Assessment & Plan Note (Signed)
Limited bedside echocardiogram shows no mitral regurgitation. No pathological murmur on exam. No pericardial effusion is noted.

## 2011-04-30 NOTE — Patient Instructions (Signed)
Follow up as scheduled. Your physician recommends that you continue on your current medications as directed. Please refer to the Current Medication list given to you today. Your physician recommends that you go to the [facility] for lab work: BMET/BNP/CBC

## 2011-04-30 NOTE — Assessment & Plan Note (Signed)
No recurrent fevers or chills.

## 2011-04-30 NOTE — Assessment & Plan Note (Signed)
Bedside echocardiogram shows no significant aortic insufficiency. Technically very difficult however in the study was otherwise nondiagnostic to further evaluate aortic valve. No pathological murmurs on exam however.

## 2011-04-30 NOTE — Progress Notes (Addendum)
CC: Followup after aortic valve replacement and mitral valve repair October 2012.  HPI:  The patient is a 60 year old male with history of severe aortic insufficiency and moderately severe mitral regurgitation secondary to recurrent Enterococcus faecalis infections. The latter was felt to be secondary to chronic injections with low molecular weight heparin for recurrent DVT and PE. The patient is also status post IVC filter placement. The patient was referred for valve surgery after he developed symptoms of congestive heart failure. He had mild to moderate LV dysfunction. He has done remarkably well after valve surgery. He remains on Lasix. There is no evidence of volume overload and the patient is now ambulating several hours a day only using a cane and occasionally. He has very minimal sternotomy pain. He has no orthopnea PND palpitations presyncope or syncope. He is tolerating Xarelto without any bleeding complications. The patient was initially given samples of Xarelto 20 mg a day [we did not have 15 mg tablets available]. He feels is improving rapidly and wants to be enrolled in cardiac rehabilitation. He is also requesting to start driving locally. I told him to a chair to current restrictions on lifting weights and ask Dr.Owen regarding driving next week.   PMH: reviewed and listed in Problem List in Electronic Records (and see below)  Allergies/SH/FHX : available in Electronic Records for review  Medications: Current Outpatient Prescriptions  Medication Sig Dispense Refill  . aspirin 325 MG tablet Take 325 mg by mouth daily.        . clonazePAM (KLONOPIN) 0.5 MG tablet Take 0.5 mg by mouth daily as needed.       . DULoxetine (CYMBALTA) 60 MG capsule Take 60 mg by mouth daily.        . furosemide (LASIX) 40 MG tablet Take 0.5 tablets by mouth Daily.      Marland Kitchen levothyroxine (SYNTHROID, LEVOTHROID) 200 MCG tablet Take 200 mcg by mouth daily.        Marland Kitchen lisinopril (PRINIVIL,ZESTRIL) 10 MG tablet  Take 1 tablet by mouth Twice daily.      . metFORMIN (GLUCOPHAGE) 500 MG tablet Take 500 mg by mouth 2 (two) times daily with a meal.        . metoprolol tartrate (LOPRESSOR) 25 MG tablet Take 1 tablet by mouth Twice daily.      . NON FORMULARY CPAP       . omeprazole (PRILOSEC) 20 MG capsule Take 20 mg by mouth at bedtime.       . ONGLYZA 5 MG TABS tablet Take 5 mg by mouth daily.       Marland Kitchen oxyCODONE (OXY IR/ROXICODONE) 5 MG immediate release tablet Take 1-2 tablets by mouth Every 4 hours as needed.      . potassium chloride SA (K-DUR,KLOR-CON) 20 MEQ tablet Take 1 tablet (20 mEq total) by mouth daily.  30 tablet  6  . Rivaroxaban (XARELTO) 20 MG TABS Take 20 mg by mouth daily.  30 tablet  11  . simvastatin (ZOCOR) 40 MG tablet Take 1 tablet (40 mg total) by mouth every evening. SEND FUTURE REQUEST FOR THIS MEDICATION TO PRIMARY CARE DOCTOR/NO CHOLESTEROL LABS ON FILE  90 tablet  0    ROS: No nausea or vomiting. No fever or chills.No melena or hematochezia.No bleeding.No claudication. No palpitations.  Physical Exam: BP 121/86  Pulse 86  Ht 5\' 11"  (1.803 m)  Wt 304 lb (137.893 kg)  BMI 42.40 kg/m2 General: Overweight white male in no distress Neck: Normal carotid stroke  and no carotid bruits. No thyromegaly nonnodular thyroid. JVP is 6 cm. No abdominal reflux. Chest: Well-healed midsternotomy scar with no drainage. Lungs: Clear breath sounds bilaterally without crackles Cardiac: Regular rate and rhythm with occasional extrasystoles. Normal S1 and S2. No pathological murmurs Vascular: Normal peripheral vascular examination with normal dorsalis pedis and posterior tibial pulses. Severe and large serpiginous superficial varicosities predominantly in the left leg also in the right leg. Consistent with post phlebitic syndrome Skin: Warm and dry Affect: Normal  12lead ECG: Normal sinus rhythm nonspecific ST-T wave changes  Limited bedside ECHO: Technically very difficult study with poor sound  wave transmission. Apparent mild LV systolic dysfunction with an ejection fraction of approximately 45%. No mitral regurgitation was observed by color Doppler. Study was insufficient to properly evaluate aortic valve prosthesis. [Normal physical examination however see above]   Assessment and Plan

## 2011-04-30 NOTE — Assessment & Plan Note (Signed)
Extrasystoles heart on physical examination. Will obtain electrocardiogram to rule out atrial fibrillation.

## 2011-04-30 NOTE — Assessment & Plan Note (Addendum)
Patient is currently on Xarelto 20 mg a day. We will check his renal function. He may need dose adjustment to 15 mg a day. Currently there is now no further off label use of the medication as Xarelto has been approved for DVT recently. No clinical evidence of DVT postoperatively.

## 2011-05-01 ENCOUNTER — Telehealth: Payer: Self-pay | Admitting: *Deleted

## 2011-05-01 ENCOUNTER — Other Ambulatory Visit: Payer: Self-pay | Admitting: Thoracic Surgery (Cardiothoracic Vascular Surgery)

## 2011-05-01 DIAGNOSIS — E782 Mixed hyperlipidemia: Secondary | ICD-10-CM

## 2011-05-01 DIAGNOSIS — I359 Nonrheumatic aortic valve disorder, unspecified: Secondary | ICD-10-CM

## 2011-05-01 DIAGNOSIS — I059 Rheumatic mitral valve disease, unspecified: Secondary | ICD-10-CM

## 2011-05-01 DIAGNOSIS — Z79899 Other long term (current) drug therapy: Secondary | ICD-10-CM

## 2011-05-01 NOTE — Telephone Encounter (Signed)
Pt states Dr. Earnestine Leys wanted him to get last cholesterol labs from Dr. Margo Common. He states these were last done in 2009. FLP/LFT will be added to labs pt will have done tomorrow.

## 2011-05-06 ENCOUNTER — Ambulatory Visit (INDEPENDENT_AMBULATORY_CARE_PROVIDER_SITE_OTHER): Payer: Self-pay | Admitting: Thoracic Surgery (Cardiothoracic Vascular Surgery)

## 2011-05-06 ENCOUNTER — Ambulatory Visit
Admission: RE | Admit: 2011-05-06 | Discharge: 2011-05-06 | Disposition: A | Discharge status: Home / Self Care | Payer: Medicare Other | Source: Ambulatory Visit | Attending: Thoracic Surgery (Cardiothoracic Vascular Surgery) | Admitting: Thoracic Surgery (Cardiothoracic Vascular Surgery)

## 2011-05-06 ENCOUNTER — Encounter: Payer: Self-pay | Admitting: Thoracic Surgery (Cardiothoracic Vascular Surgery)

## 2011-05-06 VITALS — BP 120/85 | HR 96 | Resp 20 | Ht 71.0 in | Wt 300.0 lb

## 2011-05-06 DIAGNOSIS — Z954 Presence of other heart-valve replacement: Secondary | ICD-10-CM

## 2011-05-06 DIAGNOSIS — Z9889 Other specified postprocedural states: Secondary | ICD-10-CM

## 2011-05-06 DIAGNOSIS — I359 Nonrheumatic aortic valve disorder, unspecified: Secondary | ICD-10-CM

## 2011-05-06 DIAGNOSIS — I059 Rheumatic mitral valve disease, unspecified: Secondary | ICD-10-CM

## 2011-05-06 DIAGNOSIS — Z952 Presence of prosthetic heart valve: Secondary | ICD-10-CM

## 2011-05-06 NOTE — Patient Instructions (Signed)
The patient has been reminded to continue to avoid any heavy lifting or strenuous use of arms or shoulders for at least a total of three months from the time of surgery. The patient has been instructed that they may return driving an automobile within the next 2 weeks as long as they are no longer requiring oral narcotic pain relievers during the daytime.  They have been advised to start driving short distances during the daylight and gradually increase from there as they feel comfortable.

## 2011-05-06 NOTE — Progress Notes (Signed)
HPI: Patient returns for routine postoperative follow-up having undergone aortic valve replacement and mitral valve repair on 04/10/2011. The patient's early postoperative recovery while in the hospital was remarkably uncomplicated. Since hospital discharge the patient reports progressing very well.  He has mild residual soreness in his chest which continues to improve.  He reports that his breathing is much better than it was prior to surgery. He is sleeping fairly well at night. His appetite is good. He wants to continue to increase his physical activity. He has looked into participating in the cardiac rehabilitation program, but the cost may be prohibitive. He is doing very well with anticoagulation therapy on the Xarelto.   Current Outpatient Prescriptions  Medication Sig Dispense Refill  . aspirin 325 MG tablet Take 325 mg by mouth daily.        . clonazePAM (KLONOPIN) 0.5 MG tablet Take 0.5 mg by mouth daily as needed.       . DULoxetine (CYMBALTA) 60 MG capsule Take 60 mg by mouth daily.        . furosemide (LASIX) 40 MG tablet Take 0.5 tablets by mouth Daily.      Marland Kitchen levothyroxine (SYNTHROID, LEVOTHROID) 200 MCG tablet Take 200 mcg by mouth daily.        Marland Kitchen lisinopril (PRINIVIL,ZESTRIL) 10 MG tablet Take 1 tablet by mouth Twice daily.      . metFORMIN (GLUCOPHAGE) 500 MG tablet Take 500 mg by mouth 2 (two) times daily with a meal.        . metoprolol tartrate (LOPRESSOR) 25 MG tablet Take 1 tablet by mouth Twice daily.      . NON FORMULARY CPAP       . omeprazole (PRILOSEC) 20 MG capsule Take 20 mg by mouth at bedtime.       . ONGLYZA 5 MG TABS tablet Take 5 mg by mouth daily.       Marland Kitchen oxyCODONE (OXY IR/ROXICODONE) 5 MG immediate release tablet Take 1-2 tablets by mouth Every 4 hours as needed.      . polysaccharide iron (NIFEREX) 150 MG CAPS capsule Take 150 mg by mouth 2 (two) times daily.        . potassium chloride SA (K-DUR,KLOR-CON) 20 MEQ tablet Take 1 tablet (20 mEq total) by  mouth daily.  30 tablet  6  . Rivaroxaban (XARELTO) 20 MG TABS Take 20 mg by mouth daily.  30 tablet  11  . simvastatin (ZOCOR) 40 MG tablet Take 1 tablet (40 mg total) by mouth every evening. SEND FUTURE REQUEST FOR THIS MEDICATION TO PRIMARY CARE DOCTOR/NO CHOLESTEROL LABS ON FILE  90 tablet  0    Physical Exam: On physical exam the patient looks very good. Rhythm is regular and blood pressure well controlled. His sternal incision is healing nicely and the sternum is stable on palpation. Breath sounds are clear to auscultation and symmetrical bilaterally. Cardiovascular exam is notable for regular rate and rhythm. No murmurs are noted. The extremities are warm and well-perfused. The remainder of his physical exam is unrevealing.  Diagnostic Tests: Chest x-ray performed today demonstrates clear lung fields bilaterally. There are no pleural effusions. All of the sternal wires appear intact.  Impression: Excellent progress following aortic valve replacement and mitral valve repair.  Plan: I've encouraged the patient to continue to increase his physical activity as tolerated with his only significant limitation at this point remaining that he refrain from heavy lifting or strenuous use of his arms or shoulders for at least  another 2-3 months. All of his questions been addressed.

## 2011-06-17 ENCOUNTER — Emergency Department (HOSPITAL_COMMUNITY)
Admission: EM | Admit: 2011-06-17 | Discharge: 2011-06-18 | Disposition: A | Discharge status: Home / Self Care | Payer: Medicare Other | Attending: Emergency Medicine | Admitting: Emergency Medicine

## 2011-06-17 ENCOUNTER — Encounter (HOSPITAL_COMMUNITY): Payer: Self-pay | Admitting: Emergency Medicine

## 2011-06-17 DIAGNOSIS — Y93K1 Activity, walking an animal: Secondary | ICD-10-CM | POA: Insufficient documentation

## 2011-06-17 DIAGNOSIS — M25569 Pain in unspecified knee: Secondary | ICD-10-CM | POA: Insufficient documentation

## 2011-06-17 DIAGNOSIS — Z86718 Personal history of other venous thrombosis and embolism: Secondary | ICD-10-CM | POA: Insufficient documentation

## 2011-06-17 DIAGNOSIS — F3289 Other specified depressive episodes: Secondary | ICD-10-CM | POA: Insufficient documentation

## 2011-06-17 DIAGNOSIS — F329 Major depressive disorder, single episode, unspecified: Secondary | ICD-10-CM | POA: Insufficient documentation

## 2011-06-17 DIAGNOSIS — M25562 Pain in left knee: Secondary | ICD-10-CM

## 2011-06-17 DIAGNOSIS — Z79899 Other long term (current) drug therapy: Secondary | ICD-10-CM | POA: Insufficient documentation

## 2011-06-17 DIAGNOSIS — M25539 Pain in unspecified wrist: Secondary | ICD-10-CM | POA: Insufficient documentation

## 2011-06-17 DIAGNOSIS — I2510 Atherosclerotic heart disease of native coronary artery without angina pectoris: Secondary | ICD-10-CM | POA: Insufficient documentation

## 2011-06-17 DIAGNOSIS — E119 Type 2 diabetes mellitus without complications: Secondary | ICD-10-CM | POA: Insufficient documentation

## 2011-06-17 DIAGNOSIS — M25531 Pain in right wrist: Secondary | ICD-10-CM

## 2011-06-17 DIAGNOSIS — I10 Essential (primary) hypertension: Secondary | ICD-10-CM | POA: Insufficient documentation

## 2011-06-17 DIAGNOSIS — W19XXXA Unspecified fall, initial encounter: Secondary | ICD-10-CM

## 2011-06-17 DIAGNOSIS — Z7982 Long term (current) use of aspirin: Secondary | ICD-10-CM | POA: Insufficient documentation

## 2011-06-17 DIAGNOSIS — E785 Hyperlipidemia, unspecified: Secondary | ICD-10-CM | POA: Insufficient documentation

## 2011-06-17 NOTE — ED Notes (Signed)
Patient stated he tripped over the dog leash and fell landing on his left knee. Also complaining of right wrist pain from fall. Patient stated he is on blood thinners and has swelling to left knee right wrist.

## 2011-06-18 ENCOUNTER — Emergency Department (HOSPITAL_COMMUNITY): Payer: Medicare Other

## 2011-06-18 MED ORDER — OXYCODONE-ACETAMINOPHEN 5-325 MG PO TABS: 2.0000 | ORAL_TABLET | ORAL | Status: AC | PRN

## 2011-06-18 MED ORDER — HYDROMORPHONE HCL PF 2 MG/ML IJ SOLN
2.0000 mg | Freq: Once | INTRAMUSCULAR | Status: AC
  Administered 2011-06-18: 2 mg via INTRAMUSCULAR
  Filled 2011-06-18: qty 1

## 2011-06-18 NOTE — ED Notes (Signed)
Pt reports falling earlier tonight.  Reporting pain in left knee and right wrist.  Left knee slightly swollen.  Pedal pulses palpable and regular.  Pt reports history of blood clots.  Presently showing no SOB or any other acute distress.  Dr. Adriana Simas at bedside.

## 2011-06-18 NOTE — ED Provider Notes (Addendum)
History     CSN: [id number]  Arrival date & time 06/17/11  2315   First MD Initiated Contact with Patient 06/17/11 2348      Chief Complaint  Patient presents with  . Knee Pain  . Wrist Pain    (Consider location/radiation/quality/duration/timing/severity/associated sxs/prior treatment) HPI.... chief complaint fall. History of present illness:  Accidental fall while walking the dog tonight. C/O of pain to right wrist and left knee. No loss of consciousness or head trauma.  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  It is moderate. With movement. Described as sharp  Past Medical History  Diagnosis Date  . Obesity   . Hypertension   . Chest pain   . OSA (obstructive sleep apnea)     CPAP  . Depression   . Postphlebitic syndrome     Secondary to recurrent left lower shin faint ecchymosis w/ chronic thrombosis of lefttthigh greater saphenous  . Endocarditis   . Dyspnea   . DVT (deep vein thrombosis) in pregnancy     IVC  . Diabetes mellitus   . Hyperlipidemia   . CAD (coronary artery disease) 2008, 2011    Mild coronary plaque.  w/ normal LV function. repeated cardiac cath on 07/2009  . Aortic insufficiency 11/2010    moderate to severe. Small vegetation likely old.   . MS (mitral stenosis)     Past Surgical History  Procedure Date  . Thyroidectomy   . Cardiac catheterization 2008  . Cardiac catheterization 07/2009    minor irregulreties without obstructive disease  . Median sternotomy for aortic valve replacement 04/10/2011  . Mitral valve replacement   . Aortic valve replacement     Family History  Problem Relation Age of Onset  . Coronary artery disease      History  Substance Use Topics  . Smoking status: Former Smoker -- 1.0 packs/day for 6 years    Types: Cigarettes    Quit date: 01/22/1974  . Smokeless tobacco: Never Used  . Alcohol Use: No      Review of  Systems  All other systems reviewed and are negative.    Allergies  Review of patient's allergies indicates no known allergies.  Home Medications   Current Outpatient Rx  Name Route Sig Dispense Refill  . ASPIRIN 325 MG PO TABS Oral Take 325 mg by mouth daily.      Marland Kitchen CLONAZEPAM 0.5 MG PO TABS Oral Take 0.5 mg by mouth daily as needed.     . DULOXETINE HCL 60 MG PO CPEP Oral Take 60 mg by mouth daily.      . FUROSEMIDE 40 MG PO TABS Oral Take 0.5 tablets by mouth Daily.    Marland Kitchen LEVOTHYROXINE SODIUM 200 MCG PO TABS Oral Take 200 mcg by mouth daily.      Marland Kitchen LISINOPRIL 10 MG PO TABS Oral Take 1 tablet by mouth Twice daily.    Marland Kitchen METFORMIN HCL 500 MG PO TABS Oral Take 500 mg by mouth 2 (two) times daily with a meal.      . METOPROLOL TARTRATE 25 MG PO TABS Oral Take 1 tablet by mouth Twice daily.    . NON FORMULARY  CPAP     . OMEPRAZOLE 20 MG PO CPDR Oral Take 20 mg by mouth at bedtime.     . ONGLYZA 5 MG PO TABS Oral Take 5 mg by mouth daily.     Marland Kitchen  OXYCODONE HCL 5 MG PO TABS Oral Take 1-2 tablets by mouth Every 4 hours as needed.    Marland Kitchen POLYSACCHARIDE IRON 150 MG PO CAPS Oral Take 150 mg by mouth 2 (two) times daily.      Marland Kitchen POTASSIUM CHLORIDE CRYS CR 20 MEQ PO TBCR Oral Take 1 tablet (20 mEq total) by mouth daily. 30 tablet 6  . RIVAROXABAN 20 MG PO TABS Oral Take 20 mg by mouth daily. 30 tablet 11  . SIMVASTATIN 40 MG PO TABS Oral Take 1 tablet (40 mg total) by mouth every evening. SEND FUTURE REQUEST FOR THIS MEDICATION TO PRIMARY CARE DOCTOR/NO CHOLESTEROL LABS ON FILE 90 tablet 0    Patient need cholesterol labs    BP 142/68  Pulse 108  Temp(Src) 98.1 F (36.7 C) (Oral)  Resp 16  Ht 5\' 11"  (1.803 m)  Wt 299 lb (135.626 kg)  BMI 41.70 kg/m2  SpO2 100%  Physical Exam  Nursing note and vitals reviewed. Constitutional: He is oriented to person, place, and time. He appears well-developed and well-nourished.       Morbidly obese  HENT:  Head: Normocephalic and atraumatic.  Eyes:  Conjunctivae and EOM are normal. Pupils are equal, round, and reactive to light.  Neck: Normal range of motion. Neck supple.  Cardiovascular: Normal rate and regular rhythm.   Pulmonary/Chest: Effort normal and breath sounds normal.  Abdominal: Soft. Bowel sounds are normal.  Musculoskeletal:       Tender. Right wrist anterior aspect of knee.  Minimal pain with  Neurological: He is alert and oriented to person, place, and time. Abnormal reflex:  morbidly obese.  Skin: Skin is warm and dry.  Psychiatric: He has a normal mood and affect.    ED Course  Procedures (including critical care time)  Labs Reviewed - No data to display No results found.   No diagnosis found.    MDM  Will x-ray right wrist and left knee. Treat pain. No head trauma   X-rays negative.  No neuro deficit     Donnetta Hutching, MD 06/18/11 1610  Donnetta Hutching, MD 06/18/11 [phone]

## 2011-06-19 ENCOUNTER — Encounter: Payer: Self-pay | Admitting: *Deleted

## 2011-06-19 NOTE — Progress Notes (Signed)
Patient ID: SOLAN VOSLER, male   DOB: August 09, 1950, 60 y.o.   MRN: [id number]   Inocencio Homes, Mr. Kistler called Dr. Teddy Spike office and set up appt. We do not need to do anything further!  Eustace Pen, RN, TEFL teacher at [employer] [address] [place] [address] Phone: [phone] Fax: [phone] ---------------------------------------------------------------------------------------------------------------------------------- From: Jasmine December [[email or web address]]  Sent: Friday, June 14, 2011 11:36 AM To: Florene Glen Cc: Cyril Loosen ([facility]) Subject: Re: Note Great to hear you are doing well. I suggest Dr Marcelyn Bruins in [place]. I am forwarding this message to my nurse . Sharman Cheek from my iPhone  On Jun 14, 2011, at 10:13 AM, STEVE C [email or web address]> wrote: Dr. Andee Lineman,  I would appreciate your advice reference a sleep disorder physician in [place]. I continue to use my c-pap but experience body movements during sleep.   Thank you again for your professional and personal care. I am feeling much better following surgery.  Best regards,   Gwenlyn Perking. [name] The only blind person at Christmastime is he who has not Christmas in his heart ~Helen Lorenz Coaster

## 2011-07-02 ENCOUNTER — Encounter: Payer: Self-pay | Admitting: Cardiology

## 2011-07-02 ENCOUNTER — Ambulatory Visit (INDEPENDENT_AMBULATORY_CARE_PROVIDER_SITE_OTHER): Payer: Medicare Other | Admitting: Cardiology

## 2011-07-02 VITALS — BP 115/80 | HR 87 | Ht 71.0 in | Wt 304.0 lb

## 2011-07-02 DIAGNOSIS — Z8679 Personal history of other diseases of the circulatory system: Secondary | ICD-10-CM

## 2011-07-02 DIAGNOSIS — E785 Hyperlipidemia, unspecified: Secondary | ICD-10-CM

## 2011-07-02 DIAGNOSIS — IMO0002 Reserved for concepts with insufficient information to code with codable children: Secondary | ICD-10-CM

## 2011-07-02 DIAGNOSIS — I359 Nonrheumatic aortic valve disorder, unspecified: Secondary | ICD-10-CM

## 2011-07-02 DIAGNOSIS — I82409 Acute embolism and thrombosis of unspecified deep veins of unspecified lower extremity: Secondary | ICD-10-CM

## 2011-07-02 DIAGNOSIS — Z0389 Encounter for observation for other suspected diseases and conditions ruled out: Secondary | ICD-10-CM

## 2011-07-02 DIAGNOSIS — Z79899 Other long term (current) drug therapy: Secondary | ICD-10-CM

## 2011-07-02 DIAGNOSIS — I059 Rheumatic mitral valve disease, unspecified: Secondary | ICD-10-CM

## 2011-07-02 NOTE — Patient Instructions (Signed)
Your physician recommends that you go to the [facility] for lab work. If the results of your test are normal or stable, you will receive a letter.  If they are abnormal, the nurse will contact you by phone. Your physician wants you to follow up in:  3 months.  You will receive a reminder letter in the mail one-two months in advance.  If you don't receive a letter, please call our office to schedule the follow up appointment    Your physician recommends that you go to the [facility] for lab work If the results of your test are normal or stable, you will receive a letter.  If they are abnormal, the nurse will contact you by phone. Your physician wants you to follow up in:  3 months.  You will receive a reminder letter in the mail one-two months in advance.  If you don't receive a letter, please call our office to schedule the follow up appointment

## 2011-07-07 ENCOUNTER — Encounter: Payer: Self-pay | Admitting: Cardiology

## 2011-07-07 DIAGNOSIS — Z8679 Personal history of other diseases of the circulatory system: Secondary | ICD-10-CM | POA: Insufficient documentation

## 2011-07-07 DIAGNOSIS — IMO0002 Reserved for concepts with insufficient information to code with codable children: Secondary | ICD-10-CM | POA: Insufficient documentation

## 2011-07-07 DIAGNOSIS — Z86718 Personal history of other venous thrombosis and embolism: Secondary | ICD-10-CM | POA: Insufficient documentation

## 2011-07-07 DIAGNOSIS — O2230 Deep phlebothrombosis in pregnancy, unspecified trimester: Secondary | ICD-10-CM | POA: Insufficient documentation

## 2011-07-07 DIAGNOSIS — Z0389 Encounter for observation for other suspected diseases and conditions ruled out: Secondary | ICD-10-CM | POA: Insufficient documentation

## 2011-07-07 NOTE — Progress Notes (Signed)
Peyton Bottoms, MD, [facility] ABIM Board Certified in Adult Cardiovascular Medicine,Internal Medicine and Critical Care Medicine    CC: postoperative followup after aortic valve replacement and mitral valve repair  HPI:  The patient is doing well.  He reports no shortness of breath, orthopnea, PND.  He is increasingly ambulating.  Blood work was reviewed from November 2012.  Renal insufficiency is resolved.  Albumin is 3.4.  LDL cholesterol was 45 mg percent and HDL cholesterol 41 mg percent.  Sodium and potassium are within normal limits and his BNP is only 329.  Hemoglobin A1c 6.6, hemoglobin is 8.6 with an MCV of 74. The patient also reports that he got caught up in a dog leash and fell.  He fell on his chest.  Apparently x-rays were done and both radiographically and clinically his sternotomy appears to be stable.  He has no bleeding complications, although he has some swelling of the left hemi-chest without discoloration, which I suspect could be a seroma.  The patient reports no pain in this area. Otherwise he is doing well is remaining in normal sinus rhythm and is tolerating Xarelto which will be adjusted for normal renal function   PMH: reviewed and listed in Problem List in Electronic Records (and see below) Past Medical History  Diagnosis Date  . Obesity   . Hypertension   . OSA (obstructive sleep apnea)     CPAP  . Depression   . 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 Dyspnea   . Diabetes mellitus   . Hyperlipidemia   . Coronary artery disease (CAD) excluded 2008, 2011    Mild coronary plaque.  w/ normal LV function. repeated cardiac cath on 07/2009  . 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 History of endocarditis     status post Enterococcus faecalis, secondary to chronic injections with low molecular weight heparin  . Deep  venous thrombosis     recurrent DVT and PE, status post IVC filter   Past Surgical History  Procedure Date  . Thyroidectomy   . Cardiac catheterization 2008  . Cardiac catheterization 07/2009    minor irregulreties without obstructive disease  . Median sternotomy for aortic valve replacement 04/10/2011  . Mitral valve repair     mitral valve repair  . Aortic valve replacement     bioprosthetic aortic valve    Allergies/SH/FHX : available in Electronic Records for review  No Known Allergies History   Social History  . Marital Status: Married    Spouse Name: RITA    Number of Children: 1  . Years of Education: N/A   Occupational History  . disabled    Social History Main Topics  . Smoking status: Former Smoker -- 1.0 packs/day for 6 years    Types: Cigarettes    Quit date: 01/22/1974  . Smokeless tobacco: Never Used  . Alcohol Use: No  . Drug Use: No  . Sexually Active: Not on file   Other Topics Concern  . Not on file   Social History Narrative  . No narrative on file   Family History  Problem Relation Age of Onset  . Coronary artery disease      Medications: Current Outpatient Prescriptions  Medication Sig Dispense Refill  . aspirin 325 MG tablet Take 325 mg by mouth daily.        Marland Kitchen  clonazePAM (KLONOPIN) 0.5 MG tablet Take 0.5 mg by mouth daily as needed.       . DULoxetine (CYMBALTA) 60 MG capsule Take 60 mg by mouth daily.        . furosemide (LASIX) 40 MG tablet Take 0.5 tablets by mouth Daily.      Marland Kitchen levothyroxine (SYNTHROID, LEVOTHROID) 200 MCG tablet Take 200 mcg by mouth daily.        Marland Kitchen lisinopril (PRINIVIL,ZESTRIL) 10 MG tablet Take 1 tablet by mouth Twice daily.      . metFORMIN (GLUCOPHAGE) 500 MG tablet Take 500 mg by mouth every evening.       . metoprolol tartrate (LOPRESSOR) 25 MG tablet Take 1 tablet by mouth Twice daily.      . NON FORMULARY CPAP       . omeprazole (PRILOSEC) 20 MG capsule Take 20 mg by mouth at bedtime.       . ONGLYZA 5  MG TABS tablet Take 5 mg by mouth daily.       Marland Kitchen oxyCODONE (OXY IR/ROXICODONE) 5 MG immediate release tablet Take 1-2 tablets by mouth Every 4 hours as needed.      . polysaccharide iron (NIFEREX) 150 MG CAPS capsule Take 150 mg by mouth every evening.       . Rivaroxaban (XARELTO) 20 MG TABS Take 20 mg by mouth daily.  30 tablet  11  . simvastatin (ZOCOR) 40 MG tablet Take 1 tablet (40 mg total) by mouth every evening. SEND FUTURE REQUEST FOR THIS MEDICATION TO PRIMARY CARE DOCTOR/NO CHOLESTEROL LABS ON FILE  90 tablet  0    ROS: No nausea or vomiting. No fever or chills.No melena or hematochezia.No bleeding.No claudication.  No other complaints.  Physical Exam: BP 115/80  Pulse 87  Ht 5\' 11"  (1.803 m)  Wt 304 lb (137.893 kg)  BMI 42.40 kg/m2 General:overweight white male in no distress. Neck:supple.  Normal carotid upstroke and no carotid bruits.  No thyromegaly.  Nonnodular thyroid. Lungs:clear breath sounds bilaterally without wheezing Cardiac:regular rate and rhythm with normal S1, S2.  No pathological murmurs. Chest: Area of swelling in the left hemi-chest without discoloration, purulence, or fluctuation. Vascular:1+ edema.  Severe lower extremity superficial varicosities.  No tenderness on palpation of the cats Skin:warm and dry Physcologic:normal affect  12lead ZOX:[id number]-[id number] telemetry strip with Alive[employer] app.demonstrating normal sinus rhythm Limited bedside ECHO:N/A   Patient Active Problem List  Diagnoses  . OVERWEIGHT/OBESITY  . DEPRESSION-stable  . SLEEP APNEA, OBSTRUCTIVE  . HYPERTENSION, UNSPECIFIED  . ENDOCARDITIS-status post enterococcus faecalis infection secondary to long-term low molecular weight heparin injections.  Marland Kitchen SHORTNESS OF BREATH  . CHEST PAIN-UNSPECIFIED  . Dyspnea  . Extrasystole  . S/P AVR (aortic valve replacement)-bioprosthetic valve  . S/P mitral valve repair  . Deep venous thrombosis-recurrent DVT on warfarin requirement previously low  blood weight injections-now on Xarelto.  Marland Kitchen History of endocarditis  . Coronary artery disease (CAD) excluded  . 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 * Seroma anemia-history of postoperative anemia hemoglobin 8.6, 05/03/2011    PLAN   We'll obtain a repeat CBC and iron studies.  From a cardiovascular perspective.  The patient is doing well.  He has no complications status post a successful aortic valve replacement and mitral valve repair.  During the last office visit there was no evidence of MR or pericardial effusion by limited bedside echocardiogram.  Continue Xarelto at 20 mg by mouth daily  Patient will followup with Dr. Tressie Stalker and he can further discuss when he can be released from further restrictions on his physical activity.  No evidence of volume overload.  I made no changes in medical therapy.

## 2011-07-08 ENCOUNTER — Institutional Professional Consult (permissible substitution): Payer: Medicare Other | Admitting: Pulmonary Disease

## 2011-07-10 ENCOUNTER — Other Ambulatory Visit: Payer: Self-pay | Admitting: *Deleted

## 2011-07-10 DIAGNOSIS — D509 Iron deficiency anemia, unspecified: Secondary | ICD-10-CM

## 2011-07-10 MED ORDER — FERROUS SULFATE 325 (65 FE) MG PO TBEC: 325.0000 mg | DELAYED_RELEASE_TABLET | Freq: Three times a day (TID) | ORAL | Status: DC

## 2011-07-11 ENCOUNTER — Encounter: Payer: Self-pay | Admitting: Pulmonary Disease

## 2011-07-11 ENCOUNTER — Ambulatory Visit (INDEPENDENT_AMBULATORY_CARE_PROVIDER_SITE_OTHER): Payer: Medicare Other | Admitting: Pulmonary Disease

## 2011-07-11 DIAGNOSIS — G4733 Obstructive sleep apnea (adult) (pediatric): Secondary | ICD-10-CM

## 2011-07-11 DIAGNOSIS — G2581 Restless legs syndrome: Secondary | ICD-10-CM

## 2011-07-11 MED ORDER — ROPINIROLE HCL 0.5 MG PO TABS: ORAL_TABLET | ORAL | Status: DC

## 2011-07-11 NOTE — Assessment & Plan Note (Signed)
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  He has been tried on Mirapex in the past, but it appears that his dose was never titrated upward.  He has also been tried on horizant.  He currently is on Klonopin as needed, however studies have shown this is not an effective treatment for RLS.  At this point, I would like to try him on a different dopamine agonist, and see if we can push the dose higher.  The patient has iron deficiency anemia, and this is one of the major causes of RLS.  The patient is also on an antidepressant medication, and these can sometimes augment symptoms.  If the patient fails to improve with standard therapy, I would begin to consider whether this is truly periodic limb movements.  Other considerations would include myoclonic jerking which may have a neurologic origin.  There is really nothing to suggest a REM behavior disorder at this time.

## 2011-07-11 NOTE — Assessment & Plan Note (Signed)
The patient has a history of mild obstructive sleep apnea, but it appears to be well controlled at this time with his current CPAP settings.  I do wonder if he may be having a pressure leak through the mouth opening, but will address his leg movements first since this seems to be his overwhelming sleep issue currently.  I have encouraged him to work aggressively on weight loss.

## 2011-07-11 NOTE — Patient Instructions (Signed)
Will start on requip 0.5mg  one after dinner each night for one week, then increase to 2.  Please call if having issues with tolerance. Continue with iron supplements. Please call me in 2 weeks with your response to treatment, and will discuss further.

## 2011-07-11 NOTE — Progress Notes (Signed)
Subjective:    Patient ID: [name] [name], male    DOB: 06/30/1950, 61 y.o.   MRN: [id number]  HPI The patient is a 61 year old male who I've been asked to see for ongoing sleep issues.  He has a known history of mild obstructive sleep apnea, with an AHI of 14 per hour in 2003, and 19 per hour in 2012.  He has been on CPAP compliantly, and his pressure has been optimized on numerous occasions with weight shifts.  He is currently on 14 cm of water pressure.  He denies any issues with his mask fit or pressure, and his wife has not heard breakthrough snoring or apneic events.  He does use a nasal mask, and his wife sometimes wonders if she is hearing air leaks from mouth opening.  The past one to 2 years the patient states that his sleep has been getting worse.  He currently has nonrestorative sleep, and describes fairly significant daytime sleepiness with any period of inactivity.  His Epworth score today is 14.  Complicating all of this is the patient's history of frequent limb movements.  This has been well documented on prior sleep studies.  He has been tried on Mirapex, but his wife states it oversedated him.  He was also tried on horizant, and the patient cannot remember if he had a good response or not.  He currently is on Klonopin that he takes as needed.  The patient does have a history of significant iron deficiency, and is on replacement therapy currently.  The patient states that he does get abnormal sensations in his legs while sitting in the evening.  His description is not classic for RLS, but they are made better with movement.  The patient states that he falls asleep so quickly in bed that he does not appreciate the abnormal sensations at that time.  The wife however, notes the onset of significant leg jerking and sometimes his arms, as soon as he falls asleep.  She describes a flexion of his foot and ankle that sounds classic for periodic limb movements.  The patient's wife states he went to  these movements all night long, and it greatly disturbs her sleep.she has not seen any violent behaviors, and she does not feel that he is "acting out a dream".  He does occasionally have sleep talking.  Sleep Questionnaire: What time do you typically go to bed?( Between what hours) 12 am to 1 am How long does it take you to fall asleep? 20 minutes How many times during the night do you wake up? 4 What time do you get out of bed to start your day? 0830 Do you drive or operate heavy machinery in your occupation? No How much has your weight changed (up or down) over the past two years? (In pounds) Have you ever had a sleep study before? Yes If yes, location of study? If yes, date of study? Do you currently use CPAP? Yes If so, what pressure? 14 Do you wear oxygen at any time? No    Review of Systems  Constitutional: Positive for fatigue. Negative for fever and unexpected weight change.  HENT: Negative.  Negative for ear pain, nosebleeds, congestion, sore throat, rhinorrhea, sneezing, trouble swallowing, dental problem, postnasal drip and sinus pressure.   Eyes: Negative.  Negative for redness and itching.  Respiratory: Positive for shortness of breath. Negative for cough, chest tightness and wheezing.   Cardiovascular: Positive for leg swelling. Negative for palpitations.  Gastrointestinal: Negative.  Negative for nausea and vomiting.  Genitourinary: Negative.  Negative for dysuria.  Musculoskeletal: Positive for joint swelling.  Skin: Negative.  Negative for rash.  Neurological: Negative.  Negative for headaches.  Hematological: Negative.  Does not bruise/bleed easily.  Psychiatric/Behavioral: Negative.  Negative for dysphoric mood. The patient is not nervous/anxious.        Objective:   Physical Exam Constitutional:  Obese male, no acute distress  HENT:  Nares patent without discharge, but septal deviation to left with narrowing.  Oropharynx without exudate, palate and uvula are elongated  and thickened.  Eyes:  Perrla, eomi, no scleral icterus  Neck:  No JVD, no TMG  Cardiovascular:  Normal rate, regular rhythm, no rubs or gallops. 1/6 sem        Intact distal pulses but decreased  Pulmonary :  Normal breath sounds, no stridor or respiratory distress   No rales, rhonchi, or wheezing  Abdominal:  Soft, nondistended, bowel sounds present.  No tenderness noted.   Musculoskeletal:  1+ lower extremity edema noted.  Lymph Nodes:  No cervical lymphadenopathy noted  Skin:  No cyanosis noted  Neurologic:  Alert, appropriate, does not appear sleepy, moves all 4 extremities without obvious deficit.         Assessment & Plan:

## 2011-07-11 NOTE — Progress Notes (Signed)
Addended by: Michel Bickers A on: 07/11/2011 01:18 PM   Modules accepted: Orders

## 2011-07-24 ENCOUNTER — Other Ambulatory Visit: Payer: Self-pay | Admitting: Surgical

## 2011-07-24 ENCOUNTER — Other Ambulatory Visit: Payer: Self-pay | Admitting: Cardiology

## 2011-07-26 ENCOUNTER — Other Ambulatory Visit: Payer: Self-pay | Admitting: Surgical

## 2011-07-26 ENCOUNTER — Telehealth: Payer: Self-pay | Admitting: Pulmonary Disease

## 2011-07-26 MED ORDER — PRAMIPEXOLE DIHYDROCHLORIDE 0.25 MG PO TABS: 0.5000 mg | ORAL_TABLET | ORAL | Status: DC

## 2011-07-26 NOTE — Telephone Encounter (Signed)
Called, spoke with pt.  Pt states he took 1 tablet requip x 1 wk.  At this strength, pt states he just had a calming effect but still couldn't sleep.  He increased it to 2 tablets x 1 wk.  With the 2 tablets, pt states he was up at all times of the night, his legs were "running crazy," and one night he woke up laughing.  Pt also reports he had dry mouth and nausea with the requip.  This was no better when he took it with or without food.  He is requesting to try another med.  Dr. Shelle Iron, pls advise.  Thanks!  [facility]

## 2011-07-26 NOTE — Telephone Encounter (Signed)
PT CALLED BACK. ASKS THAT KC OR NURSE CALL HIS CELL [phone]. OK TO LMOM PER PT. [name] [name]

## 2011-07-26 NOTE — Telephone Encounter (Signed)
Spoke with pt and notified of KC's recs. He verbalized understanding. Rx sent to pahrm./

## 2011-07-26 NOTE — Telephone Encounter (Signed)
Would like to try mirapex 0.25mg  2 tabs after dinner each night.  He has been on this in the past in low dose only #30, no fills.  Let us know how it goes.

## 2011-07-29 ENCOUNTER — Other Ambulatory Visit: Payer: Self-pay | Admitting: *Deleted

## 2011-07-29 MED ORDER — METOPROLOL TARTRATE 25 MG PO TABS: 25.0000 mg | ORAL_TABLET | Freq: Two times a day (BID) | ORAL | Status: DC

## 2011-07-29 MED ORDER — LISINOPRIL 10 MG PO TABS: 10.0000 mg | ORAL_TABLET | Freq: Every day | ORAL | Status: DC

## 2011-08-05 ENCOUNTER — Encounter: Payer: Self-pay | Admitting: Thoracic Surgery (Cardiothoracic Vascular Surgery)

## 2011-08-05 ENCOUNTER — Ambulatory Visit (INDEPENDENT_AMBULATORY_CARE_PROVIDER_SITE_OTHER): Payer: Medicare Other | Admitting: Thoracic Surgery (Cardiothoracic Vascular Surgery)

## 2011-08-05 VITALS — BP 148/99 | HR 88 | Resp 20 | Ht 71.0 in | Wt 310.0 lb

## 2011-08-05 DIAGNOSIS — Z9889 Other specified postprocedural states: Secondary | ICD-10-CM | POA: Insufficient documentation

## 2011-08-05 DIAGNOSIS — Z954 Presence of other heart-valve replacement: Secondary | ICD-10-CM

## 2011-08-05 DIAGNOSIS — Z952 Presence of prosthetic heart valve: Secondary | ICD-10-CM

## 2011-08-05 NOTE — Progress Notes (Signed)
[address]            [place] [address]          [phone]     CARDIOTHORACIC SURGERY OFFICE NOTE  Referring Provider is de Karma Lew, MD PCP is Louie Boston, MD, MD   HPI:  Patient returns for follow-up s/p aortic valve replacement and mitral valve repair on 04/10/2011.  He was last seen here in our office on 05/06/2011. Since then the patient has done well from a cardiovascular standpoint. He has been seen in followup by Dr. Andee Lineman to and apparently had a bedside echocardiogram that reportedly looked good.  He states that over the last couple of weeks he has had increased fatigue and dyspnea with exertion. He has gained back all of the weight he lost early after surgery. He reports that his blood sugars have been under good control. He denies any chest pain. He has not had orthopnea or lower extremity edema. He was seen in followup recently by Dr. Talmage Nap and apparently has had thyroid hormone levels checked with results pending at this time. One of his diabetes medications was discontinued.   Current Outpatient Prescriptions  Medication Sig Dispense Refill  . aspirin 325 MG tablet Take 325 mg by mouth daily.        . clonazePAM (KLONOPIN) 0.5 MG tablet Take 0.5 mg by mouth daily as needed.       . DULoxetine (CYMBALTA) 60 MG capsule Take 60 mg by mouth daily.        . ferrous sulfate 325 (65 FE) MG EC tablet Take 1 tablet (325 mg total) by mouth 3 (three) times daily.  90 tablet  6  . furosemide (LASIX) 40 MG tablet Take 0.5 tablets by mouth Daily.      Marland Kitchen levothyroxine (SYNTHROID, LEVOTHROID) 200 MCG tablet Take 200 mcg by mouth daily.        Marland Kitchen lisinopril (PRINIVIL,ZESTRIL) 10 MG tablet Take 1 tablet (10 mg total) by mouth daily.  60 tablet  6  . metFORMIN (GLUCOPHAGE) 500 MG tablet Take 500 mg by mouth every evening.       . metoprolol tartrate (LOPRESSOR) 25 MG tablet Take 1 tablet (25 mg total) by mouth 2 (two) times daily.  60 tablet  6  .  NON FORMULARY CPAP       . omeprazole (PRILOSEC) 20 MG capsule Take 20 mg by mouth at bedtime.       Marland Kitchen oxyCODONE (OXY IR/ROXICODONE) 5 MG immediate release tablet Take 1-2 tablets by mouth Every 4 hours as needed.      . Rivaroxaban (XARELTO) 20 MG TABS Take 20 mg by mouth daily.  30 tablet  11  . simvastatin (ZOCOR) 40 MG tablet TAKE 1 TABLET BY MOUTH EVERY EVENING  90 tablet  3  . ONGLYZA 5 MG TABS tablet Take 5 mg by mouth daily.           Physical Exam:   BP 148/99  Pulse 88  Resp 20  Ht 5\' 11"  (1.803 m)  Wt 310 lb (140.615 kg)  BMI 43.24 kg/m2  SpO2 96%  General:  Morbidly obese but otherwise well-appearing  Chest:   Clear to auscultation with no wheezes rales or rhonchi noted. The median sternotomy incision is healed completely and the sternum is stable on palpation.  CV:   Regular rate and rhythm without  murmur  Abdomen:  Morbidly obese but soft and nontender  Extremities:  Warm and well-perfused without lower extremity edema  Diagnostic Tests:  n/a   Impression:  Patient has done well from a cardiovascular standpoint currently 4 months following aortic valve replacement mitral valve repair. Over the last few weeks patient has had increased fatigue. He has had a total thyroidectomy in the past and I certainly wonder if his thyroid hormone levels might need to be adjusted. He does not have a murmur on exam and he does not appear to be heart failure. He reportedly had a bedside echocardiogram performed not too long ago that looked good.  Plan:  Consider repeat echocardiogram if the patient develops symptoms or signs suggestive of recurrent congestive heart failure. Patient really needs to find a way to lose weight. I've instructed him to followup as soon as practical Dr. Talmage Nap find out whether or not his thyroid medication dosage needs to be adjusted. In the future he will call and return to see [place] as needed.   Salvatore Decent. [name], MD 08/05/2011 11:54 AM

## 2011-08-05 NOTE — Patient Instructions (Addendum)
The patient no longer has any physical restrictions with respect to his previous heart surgery.  He has strongly been advised to find a way to lose weight.

## 2011-08-12 ENCOUNTER — Telehealth: Payer: Self-pay | Admitting: Pulmonary Disease

## 2011-08-12 DIAGNOSIS — G4733 Obstructive sleep apnea (adult) (pediatric): Secondary | ICD-10-CM

## 2011-08-12 NOTE — Telephone Encounter (Signed)
Ok to get him a full face mask.

## 2011-08-12 NOTE — Telephone Encounter (Signed)
I spoke with pt and he stated he needs an rx for a full face mask bc he has just a nasal mask. He wants this sent to [facility] pharmacy. Pt states he is blowing air out and is not able to sleep with the nasal mask. Please advise Dr. Shelle Iron, thanks

## 2011-08-13 NOTE — Telephone Encounter (Signed)
Order was sent to [facility] for [facility].  LMOM for the pt to be made aware.

## 2011-08-26 ENCOUNTER — Encounter: Payer: Self-pay | Admitting: *Deleted

## 2011-08-26 ENCOUNTER — Other Ambulatory Visit: Payer: Self-pay | Admitting: *Deleted

## 2011-08-26 DIAGNOSIS — D649 Anemia, unspecified: Secondary | ICD-10-CM

## 2011-09-10 ENCOUNTER — Telehealth: Payer: Self-pay | Admitting: *Deleted

## 2011-09-10 NOTE — Telephone Encounter (Signed)
Message copied by Murriel Hopper on Tue Sep 10, 2011 11:09 AM ------      Message from: Learta Codding      Created: Tue Sep 03, 2011  8:51 AM       Still very low iron stores.,, What his dose of iron sulfate currently. As he had colonoscopy within the last year. He did check guaiac stools and as he discussed this with his primary care physician. Hemoglobin stable at 11.1 but MCV is very low and ferritin level is very low consistent with very low iron stores. His GI workup previously has been negative he may benefit from either intravenous iron for much higher doses of iron sulfate he can tolerate.

## 2011-09-10 NOTE — Telephone Encounter (Signed)
Notes Recorded by Lesle Chris, LPN on 0/45/4098 at 11:09 AM No answer.

## 2011-09-11 NOTE — Telephone Encounter (Signed)
Notes Recorded by Lesle Chris, LPN on 09/30/8117 at 10:46 AM Patient notified and verbalized understanding. Has OV with GD tomorrow & can discuss further in detail then. ------

## 2011-09-12 ENCOUNTER — Ambulatory Visit (INDEPENDENT_AMBULATORY_CARE_PROVIDER_SITE_OTHER): Payer: Medicare Other | Admitting: Cardiology

## 2011-09-12 ENCOUNTER — Encounter: Payer: Self-pay | Admitting: Cardiology

## 2011-09-12 VITALS — BP 134/79 | HR 82 | Ht 71.0 in | Wt 318.0 lb

## 2011-09-12 DIAGNOSIS — I359 Nonrheumatic aortic valve disorder, unspecified: Secondary | ICD-10-CM

## 2011-09-12 DIAGNOSIS — I059 Rheumatic mitral valve disease, unspecified: Secondary | ICD-10-CM

## 2011-09-12 DIAGNOSIS — D649 Anemia, unspecified: Secondary | ICD-10-CM

## 2011-09-12 NOTE — Patient Instructions (Signed)
Your physician recommends that you go to the [facility] for lab work for CBC.  If the results of your test are normal or stable, you will receive a letter.  If they are abnormal, the nurse will contact you by phone. Your physician wants you to follow up in: 6 months.  You will receive a reminder letter in the mail one-two months in advance.  If you don't receive a letter, please call our office to schedule the follow up appointment

## 2011-09-12 NOTE — Progress Notes (Signed)
[name] Bottoms, MD, [facility] ABIM Board Certified in Adult Cardiovascular Medicine,Internal Medicine and Critical Care Medicine    CC: Followup patient after valve replacement and mitral valve repair.  HPI:  The patient is a 61 year old male underwent a valve replacement and mitral valve repair secondary to prior history of endocarditis. Followup bedside echocardiogram last office visit demonstrated normal LV function and normal functioning valves. From a cardiac standpoint the patient is doing well. He has no chest pain. He still has some dyspnea but this has been a chronic problem in part due to deconditioning and being overweight. He has no clinical symptoms or signs of heart failure however. He does complain of some fatigue although this is gradually improving. He has been diagnosed iron deficiency but has been taking iron 3 times a day and we will followup of CBC. He did have a colonoscopy back in 2001 by Dr. Renae Fickle with no significant malignancy. He will followup with him in the future. He reports no palpitations presyncope or syncope. Is also seeing Dr. Talmage Nap for management of his diabetes. The patient is trying to lose some weight. The patient unfortunately never turned his guaiac cards.  PMH: reviewed and listed in Problem List in Electronic Records (and see below) Past Medical History  Diagnosis Date  . Obesity   . Hypertension   . OSA (obstructive sleep apnea)     CPAP  . Depression   . 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 Dyspnea   . Diabetes mellitus   . Hyperlipidemia   . Coronary artery disease (CAD) excluded 2008, 2011    Mild coronary plaque.  w/ normal LV function. repeated cardiac cath on 07/2009  . 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 History of endocarditis     status post Enterococcus faecalis, secondary to chronic  injections with low molecular weight heparin  . Deep venous thrombosis     recurrent DVT and PE, status post IVC filter   Past Surgical History  Procedure Date  . Thyroidectomy   . Cardiac catheterization 2008  . Cardiac catheterization 07/2009    minor irregulreties without obstructive disease  . Mitral valve repair 04/10/2011    autologous pericardial patch augmentation of posterior leaflet with 28mm Sorin Memo 3D ring annuloplasty  . Aortic valve replacement 04/10/2011    25mm [facility] Ease pericardial bioprosthetic aortic valve    Allergies/SH/FHX : available in Electronic Records for review  No Known Allergies History   Social History  . Marital Status: Married    Spouse Name: RITA    Number of Children: 1  . Years of Education: N/A   Occupational History  . disabled    Social History Main Topics  . Smoking status: Former Smoker -- 1.0 packs/day for 6 years    Types: Cigarettes    Quit date: 01/22/1974  . Smokeless tobacco: Never Used  . Alcohol Use: No  . Drug Use: No  . Sexually Active: Not on file   Other Topics Concern  . Not on file   Social History Narrative  . No narrative on file   Family History  Problem Relation Age of Onset  . Coronary artery disease      Medications: Current Outpatient Prescriptions  Medication Sig Dispense Refill  . acetaminophen (TYLENOL) 500 MG tablet Take 500 mg by mouth every 6 (  six) hours as needed.      Marland Kitchen aspirin 325 MG tablet Take 325 mg by mouth daily.        . clonazePAM (KLONOPIN) 0.5 MG tablet Take 0.5 mg by mouth daily as needed.       . DULoxetine (CYMBALTA) 60 MG capsule Take 60 mg by mouth daily.        . ferrous sulfate 325 (65 FE) MG EC tablet Take 1 tablet (325 mg total) by mouth 3 (three) times daily.  90 tablet  6  . furosemide (LASIX) 40 MG tablet Take 40 mg by mouth Daily.       Marland Kitchen HYDROcodone-acetaminophen (VICODIN) 5-500 MG per tablet Take 1 tablet by mouth every 6 (six) hours as needed.      Marland Kitchen  levothyroxine (SYNTHROID, LEVOTHROID) 112 MCG tablet Take 224 mcg by mouth daily.      Marland Kitchen lisinopril (PRINIVIL,ZESTRIL) 10 MG tablet Take 1 tablet (10 mg total) by mouth daily.  60 tablet  6  . metFORMIN (GLUCOPHAGE) 500 MG tablet Take 500 mg by mouth every evening.       . metoprolol tartrate (LOPRESSOR) 25 MG tablet Take 1 tablet (25 mg total) by mouth 2 (two) times daily.  60 tablet  6  . NON FORMULARY CPAP       . omeprazole (PRILOSEC) 20 MG capsule Take 20 mg by mouth at bedtime.       Marland Kitchen oxyCODONE (OXY IR/ROXICODONE) 5 MG immediate release tablet Take 1-2 tablets by mouth Every 4 hours as needed.      . Rivaroxaban (XARELTO) 20 MG TABS Take 20 mg by mouth daily.  30 tablet  11  . simvastatin (ZOCOR) 40 MG tablet TAKE 1 TABLET BY MOUTH EVERY EVENING  90 tablet  3    ROS: No nausea or vomiting. No fever or chills.No melena or hematochezia.No bleeding.No claudication  Physical Exam: BP 134/79  Pulse 82  Ht 5\' 11"  (1.803 m)  Wt 318 lb (144.244 kg)  BMI 44.35 kg/m2 General: Overweight white male in no distress Neck: Normal carotid upstroke no carotid bruits. No thyromegaly. Nonnodular thyroid. JVP is 5 cm Lungs: Clear breath sounds bilaterally without wheezing. Cardiac: Regular rate and rhythm with normal S1-S2 no murmur rubs or gallops. Soft systolic murmur at the right upper sternal border Vascular: No edema. Normal distal pulses. Edema secondary to postphlebitic syndrome Skin: Warm and dry Physcologic: Normal affect  12lead ECG: Not available Limited bedside ECHO:N/A No images are attached to the encounter.    Patient Active Problem List  Diagnoses  . OVERWEIGHT/OBESITY  . DEPRESSION  . SLEEP APNEA, OBSTRUCTIVE  . HYPERTENSION, UNSPECIFIED  . ENDOCARDITIS  . SKIN LESION  . SHORTNESS OF BREATH  . CHEST PAIN-UNSPECIFIED  . TRAUMATIC COMPARTMENT SYNDROME LOWER EXTREMITY  . Dyspnea  . Aortic insufficiency  . Mitral regurgitation  . Extrasystole  . S/P AVR (aortic valve  replacement)  . S/P mitral valve repair  . Deep venous thrombosis  . History of endocarditis  . Coronary artery disease (CAD) excluded  . Postphlebitic syndrome  . Seroma  . RLS (restless legs syndrome)    PLAN   Patient is tolerating Xarelto without any significant bleeding complications.  He took a very well after valve surgery. His exercise tolerance is improving.  We will followup with a CBC to evaluate his anemia. Previously had low iron stores. He did have a colonoscopy however 2011 with no significant malignancy. His CBC remains low on iron therapy  we will refer him back to gastroenterology.  I encouraged the patient to continue his exercise program, and a lot of walking and try to lose some weight.

## 2011-09-24 ENCOUNTER — Encounter: Payer: Self-pay | Admitting: *Deleted

## 2011-12-16 ENCOUNTER — Other Ambulatory Visit: Payer: Self-pay | Admitting: Family Medicine

## 2012-01-24 ENCOUNTER — Ambulatory Visit (INDEPENDENT_AMBULATORY_CARE_PROVIDER_SITE_OTHER): Payer: Medicare Other | Admitting: Cardiovascular Disease

## 2012-01-24 ENCOUNTER — Encounter: Payer: Self-pay | Admitting: Cardiovascular Disease

## 2012-01-24 VITALS — BP 152/100 | HR 77 | Ht 71.0 in | Wt 318.0 lb

## 2012-01-24 DIAGNOSIS — Z952 Presence of prosthetic heart valve: Secondary | ICD-10-CM

## 2012-01-24 DIAGNOSIS — I2510 Atherosclerotic heart disease of native coronary artery without angina pectoris: Secondary | ICD-10-CM

## 2012-01-24 DIAGNOSIS — Z954 Presence of other heart-valve replacement: Secondary | ICD-10-CM

## 2012-01-24 DIAGNOSIS — I8390 Asymptomatic varicose veins of unspecified lower extremity: Secondary | ICD-10-CM

## 2012-01-24 DIAGNOSIS — R0600 Dyspnea, unspecified: Secondary | ICD-10-CM

## 2012-01-24 DIAGNOSIS — R0609 Other forms of dyspnea: Secondary | ICD-10-CM

## 2012-01-24 LAB — CBC WITH DIFFERENTIAL/PLATELET
Basophils Absolute: 0 10*3/uL (ref 0.0–0.1)
Basophils Relative: 1 % (ref 0–1)
Hemoglobin: 12.3 g/dL — ABNORMAL LOW (ref 13.0–17.0)
MCHC: 33.2 g/dL (ref 30.0–36.0)
Neutro Abs: 3.7 10*3/uL (ref 1.7–7.7)
Neutrophils Relative %: 60 % (ref 43–77)
Platelets: 198 10*3/uL (ref 150–400)
RBC: 4.76 MIL/uL (ref 4.22–5.81)
RDW: 15.8 % — ABNORMAL HIGH (ref 11.5–15.5)
WBC: 6.2 10*3/uL (ref 4.0–10.5)

## 2012-01-24 LAB — BASIC METABOLIC PANEL
Calcium: 9.1 mg/dL (ref 8.4–10.5)
Creat: 1.11 mg/dL (ref 0.50–1.35)
Sodium: 137 mEq/L (ref 135–145)

## 2012-01-24 NOTE — Assessment & Plan Note (Signed)
Well controlled.  Continue current medications and low sodium Dash type diet.    

## 2012-01-24 NOTE — Assessment & Plan Note (Signed)
Discussed low carb diet and exercise program  

## 2012-01-24 NOTE — Assessment & Plan Note (Signed)
Continue current dose of lasix.  Compression stockings.  Elevate legs at end of day

## 2012-01-24 NOTE — Assessment & Plan Note (Addendum)
Functional.  Will check echo to reassess PA pressure and valves. Check labs to include BMET, CBC and BNP

## 2012-01-24 NOTE — Patient Instructions (Addendum)
Your physician wants you to follow-up in: 3 months with Dr. Andee Lineman. You will receive a reminder letter in the mail two months in advance. If you don't receive a letter, please call our office to schedule the follow-up appointment.  Your physician has requested that you have an echocardiogram at [facility]. Echocardiography is a painless test that uses sound waves to create images of your heart. It provides your doctor with information about the size and shape of your heart and how well your heart's chambers and valves are working. This procedure takes approximately one hour. There are no restrictions for this procedure.  Labs Today:  BMET, BNP, CBC

## 2012-01-24 NOTE — Assessment & Plan Note (Signed)
Normal on exam with no AR  Echo to reassess.  SBE prophylaxis.

## 2012-01-24 NOTE — Progress Notes (Signed)
Patient ID: [name] [name], male   DOB: 1950/10/30, 61 y.o.   MRN: [id number] 61 yo patient of Dr [name] [name].  Last seen in March for F/U valves.  AVR and mitral valve repair secondary to prior history of endocarditis. Followup bedside echocardiogram last office visit 6/12  demonstrated normal LV function and normal functioning valves. Gradients across AVR mean 9 mmHg and peak 17 mmHg   From a cardiac standpoint the patient is doing well. He has no chest pain. He still has some dyspnea but this has been a chronic problem in part due to deconditioning and being overweight. He has no clinical symptoms or signs of heart failure however. He does complain of some fatigue although this is gradually improving  Long discussion with patient about diet , and weight loss.    04/10/11 AVR/MV repair [name] [name] PREOPERATIVE DIAGNOSES:  1. Severe aortic insufficiency.  2. Moderate mitral regurgitation.  POSTOPERATIVE DIAGNOSES:  1. Severe aortic insufficiency.  2. Moderate mitral regurgitation.  PROCEDURE: Median sternotomy for aortic valve replacement (25 mm  [name] [name] pericardial tissue valve) and mitral valve repair  (complex valvuloplasty including autologous pericardial patch repair of  posterior leaflet with plication of posterior commissure and 28 mm Memo 3D  ring annuloplasty) and placement of left subclavian central venous line and right heart catheterization (Swan-Ganz catheter placement).  Cath prior to surgery with no CAD.  PA pressure elevated in 60 range thought passive secondary to servere AR and MR .    ROS: Denies fever, malais, weight loss, blurry vision, decreased visual acuity, cough, sputum, SOB, hemoptysis, pleuritic pain, palpitaitons, heartburn, abdominal pain, melena, lower extremity edema, claudication, or rash.  All other systems reviewed and negative  General: Affect appropriate Obese white male HEENT: normal Neck supple with no adenopathy JVP normal no bruits no  thyromegaly Lungs clear with no wheezing and good diaphragmatic motion Heart:  S1/S2 soft systolic  murmur, no rub, gallop or click PMI normal Abdomen: benighn, BS positve, no tenderness, no AAA no bruit.  No HSM or HJR Distal pulses intact with no bruits Trace edema with marked varicosities in both legs and thighs Neuro [name]-focal Skin warm and dry No muscular weakness   Current Outpatient Prescriptions  Medication Sig Dispense Refill  . acetaminophen (TYLENOL) 500 MG tablet Take 500 mg by mouth every 6 (six) hours as needed.      Marland Kitchen aspirin 325 MG tablet Take 325 mg by mouth daily.        . [name] [name] (KLONOPIN) 0.5 MG tablet Take 0.5 mg by mouth daily as needed.       . [name] [name] (CYMBALTA) 60 MG capsule Take 60 mg by mouth daily.        . ferrous sulfate 325 (65 FE) MG EC tablet Take 1 tablet (325 mg total) by mouth 3 (three) times daily.  90 tablet  6  . furosemide (LASIX) 40 MG tablet Take 40 mg by mouth Daily.       Marland Kitchen [name] [name] (VICODIN) 5-500 MG per tablet Take 1 tablet by mouth every 6 (six) hours as needed.      Marland Kitchen [name] [name] (SYNTHROID, LEVOTHROID) 112 MCG tablet Take 224 mcg by mouth daily.      Marland Kitchen [name] [name] (PRINIVIL,ZESTRIL) 10 MG tablet Take 1 tablet (10 mg total) by mouth daily.  60 tablet  6  . [name] [name] (GLUCOPHAGE) 500 MG tablet Take 500 mg by mouth every evening.       . [name] [name] (LOPRESSOR) 25 MG tablet Take  1 tablet (25 mg total) by mouth 2 (two) times daily.  60 tablet  6  . [name] [name]       . [name] [name] (PRILOSEC) 20 MG capsule Take 20 mg by mouth at bedtime.       Marland Kitchen [name] [name] (OXY IR/ROXICODONE) 5 MG immediate release tablet Take 1-2 tablets by mouth Every 4 hours as needed.      . [name] [name] (XARELTO) 20 MG TABS Take 20 mg by mouth daily.  30 tablet  11  . [name] [name] (ZOCOR) 40 MG tablet TAKE 1 TABLET BY MOUTH EVERY EVENING  90 tablet  3  . DISCONTD: [name] [name] 5 MG TABS tablet Take 5 mg by mouth daily.          Allergies  Review of patient's allergies indicates no known allergies.  Electrocardiogram:  04/30/11  NSR rate 83 nonspecific ST/T wave changes QT 470  ECG today NSR rate 78 normal  Assessment and Plan

## 2012-01-25 LAB — BRAIN NATRIURETIC PEPTIDE: Brain Natriuretic Peptide: 64.8 pg/mL (ref 0.0–100.0)

## 2012-01-28 ENCOUNTER — Telehealth: Payer: Self-pay | Admitting: Cardiovascular Disease

## 2012-01-28 ENCOUNTER — Inpatient Hospital Stay (HOSPITAL_COMMUNITY): Admission: RE | Admit: 2012-01-28 | Payer: Medicare Other | Source: Ambulatory Visit

## 2012-01-28 ENCOUNTER — Ambulatory Visit (HOSPITAL_COMMUNITY)
Admission: RE | Admit: 2012-01-28 | Discharge: 2012-01-28 | Disposition: A | Discharge status: Home / Self Care | Payer: Medicare Other | Source: Ambulatory Visit | Attending: Cardiovascular Disease | Admitting: Cardiovascular Disease

## 2012-01-28 DIAGNOSIS — R0989 Other specified symptoms and signs involving the circulatory and respiratory systems: Secondary | ICD-10-CM | POA: Insufficient documentation

## 2012-01-28 DIAGNOSIS — R0609 Other forms of dyspnea: Secondary | ICD-10-CM | POA: Insufficient documentation

## 2012-01-28 DIAGNOSIS — R0600 Dyspnea, unspecified: Secondary | ICD-10-CM

## 2012-01-28 DIAGNOSIS — I059 Rheumatic mitral valve disease, unspecified: Secondary | ICD-10-CM

## 2012-01-28 DIAGNOSIS — E119 Type 2 diabetes mellitus without complications: Secondary | ICD-10-CM | POA: Insufficient documentation

## 2012-01-28 DIAGNOSIS — I10 Essential (primary) hypertension: Secondary | ICD-10-CM | POA: Insufficient documentation

## 2012-01-28 DIAGNOSIS — I2510 Atherosclerotic heart disease of native coronary artery without angina pectoris: Secondary | ICD-10-CM

## 2012-01-28 NOTE — Telephone Encounter (Signed)
Patient states that his BP has been running consistently high for the past 1-2 wks.  Yesterday morning it was 139/102.  Patient stated that last night while he was in the shower, he "blacked out".  Could not remember where he was or what had happened.  Wants to know if any of his meds need to be changed. / tg

## 2012-01-28 NOTE — Progress Notes (Signed)
*  PRELIMINARY RESULTS* Echocardiogram 2D Echocardiogram has been performed.  Caswell Corwin 01/28/2012, 9:23 AM

## 2012-01-28 NOTE — Telephone Encounter (Signed)
Spoke with wife who states that he is being seen by endocrinologist today and will address this with him, as she gave him sugar and he felt better following the episode and he had not eaten well that day and had been very active.  Advised her to keep track of BP and contact us if it continues to run high.

## 2012-01-29 ENCOUNTER — Encounter: Payer: Self-pay | Admitting: *Deleted

## 2012-03-26 ENCOUNTER — Ambulatory Visit: Payer: Medicare Other | Admitting: Cardiovascular Disease

## 2012-11-02 ENCOUNTER — Encounter: Payer: Self-pay | Admitting: Cardiovascular Disease

## 2012-11-02 ENCOUNTER — Ambulatory Visit (INDEPENDENT_AMBULATORY_CARE_PROVIDER_SITE_OTHER): Payer: Medicare Other | Admitting: Cardiovascular Disease

## 2012-11-02 VITALS — BP 138/69 | HR 80 | Ht 71.0 in | Wt 329.2 lb

## 2012-11-02 DIAGNOSIS — Z954 Presence of other heart-valve replacement: Secondary | ICD-10-CM

## 2012-11-02 DIAGNOSIS — I10 Essential (primary) hypertension: Secondary | ICD-10-CM

## 2012-11-02 DIAGNOSIS — E1159 Type 2 diabetes mellitus with other circulatory complications: Secondary | ICD-10-CM | POA: Insufficient documentation

## 2012-11-02 DIAGNOSIS — E119 Type 2 diabetes mellitus without complications: Secondary | ICD-10-CM

## 2012-11-02 DIAGNOSIS — I359 Nonrheumatic aortic valve disorder, unspecified: Secondary | ICD-10-CM

## 2012-11-02 DIAGNOSIS — Z952 Presence of prosthetic heart valve: Secondary | ICD-10-CM

## 2012-11-02 DIAGNOSIS — I351 Nonrheumatic aortic (valve) insufficiency: Secondary | ICD-10-CM

## 2012-11-02 DIAGNOSIS — R079 Chest pain, unspecified: Secondary | ICD-10-CM

## 2012-11-02 DIAGNOSIS — R0602 Shortness of breath: Secondary | ICD-10-CM

## 2012-11-02 MED ORDER — SIMVASTATIN 40 MG PO TABS: 40.0000 mg | ORAL_TABLET | Freq: Every day | ORAL | Status: DC

## 2012-11-02 NOTE — Assessment & Plan Note (Signed)
Well controlled.  Continue current medications and low sodium Dash type diet.    

## 2012-11-02 NOTE — Assessment & Plan Note (Signed)
No change in exam Follows SBE AVR and MVrepair looked good on echo last year

## 2012-11-02 NOTE — Assessment & Plan Note (Signed)
Discussed low carb diet.  Target hemoglobin A1c is 6.5 or less.  Continue current medications. [place] would not be ideal as his SBE was ppt by lovenox injections.  If he does not lose weight  He will end up on some sort of injection Rx

## 2012-11-02 NOTE — Assessment & Plan Note (Signed)
Multiple on chronic xarelto.  Cr normal Discussed need for CT/imaging for any major trauma.

## 2012-11-02 NOTE — Addendum Note (Signed)
Addended by: Derry Lory A on: 11/02/2012 04:04 PM   Modules accepted: Orders

## 2012-11-02 NOTE — Patient Instructions (Addendum)
Your physician wants you to follow-up in: 6 months with Dr. Eden Emms. You will receive a reminder letter in the mail two months in advance. If you don't receive a letter, please call our office to schedule the follow-up appointment.  Your physician recommends that you continue on your current medications as directed. Please refer to the Current Medication list given to you today.  An order for cardiac rehab has been placed for you to [facility]. They will contact you about starting this.

## 2012-11-02 NOTE — Progress Notes (Signed)
Patient ID: [name] [name], male   DOB: 1950/11/04, 62 y.o.   MRN: [id number] 62 yo patient of [name] [name] [name]. Last seen in March for F/U valves. AVR and mitral valve repair secondary to prior history of endocarditis. Followup bedside echocardiogram last office visit 6/12 demonstrated normal LV function and normal functioning valves. Gradients across AVR mean 9 mmHg and peak 17 mmHg From a cardiac standpoint the patient is doing well. He has no chest pain. He still has some dyspnea but this has been a chronic problem in part due to deconditioning and being overweight. He has no clinical symptoms or signs of heart failure however. He does complain of some fatigue although this is gradually improving Long discussion with patient about diet , and weight loss.   04/10/11 AVR/MV repair [name] [name]  PREOPERATIVE DIAGNOSES:  1. Severe aortic insufficiency.  2. Moderate mitral regurgitation.  POSTOPERATIVE DIAGNOSES:  1. Severe aortic insufficiency.  2. Moderate mitral regurgitation.   PROCEDURE: Median sternotomy for aortic valve replacement (25 mm  [facility] Ease pericardial tissue valve) and mitral valve repair  (complex valvuloplasty including autologous pericardial patch repair of  posterior leaflet with plication of posterior commissure and 28 mm Memo 3D  ring annuloplasty) and placement of left subclavian central venous line and right heart catheterization (Swan-Ganz catheter placement).  Cath prior to surgery with no CAD. PA pressure elevated in 60 range thought passive secondary to servere AR and MR  Echo 2013 Study Conclusions  - Left ventricle: The cavity size was normal. Wall thickness was increased in a pattern of mild LVH. Systolic function was normal. The estimated ejection fraction was in the range of 60% to 65%. Wall motion was normal; there were no regional wall motion abnormalities. The study is not technically sufficient to allow evaluation of LV diastolic function. - Aortic valve:  Bioprosthetic aortic valve with apparent conduit noted, leaflets not well defined. No obvious perivalvular leak. Appears somewhat undersized to root but gradients are stable. No significant regurgitation. Mean gradient: 11mm Hg (S). - Mitral valve: Mildly to moderatelythickened leaflets with annular thickening - consistent with reported mitral repair and annuloplasty. Mean gradient: 11mm Hg (D). Prominent diastolic jet, presumably across mitral valve, noted well back into LV - cannot exclude functional mitral stenosis. Interrogation was failrly limited. - Left atrium: The atrium was severely dilated. - Tricuspid valve: Trivial regurgitation. - Pulmonary arteries: Systolic pressure could not be accurately estimated. - Pericardium, extracardiac: There was no pericardial effusion.  Patient has had multiple DVT;s and IVC filter placed. On chronic xarelto.  Apparantly saw heme before but no definite diagnosis of hypercoagulability.  Teeth in bad shape but seeing [name] [name] oral surgeon in [place] and does SBE prophylaxis  ROS: Denies fever, malais, weight loss, blurry vision, decreased visual acuity, cough, sputum, SOB, hemoptysis, pleuritic pain, palpitaitons, heartburn, abdominal pain, melena, lower extremity edema, claudication, or rash.  All other systems reviewed and negative  General: Affect appropriate Obese white male HEENT: normal Neck supple with no adenopathy JVP normal no bruits no thyromegaly Lungs clear with no wheezing and good diaphragmatic motion Heart:  S1/S2 SEM no AR murmur, no rub, gallop or click PMI normal Abdomen: benighn, BS positve, no tenderness, no AAA no bruit.  No HSM or HJR Distal pulses intact with no bruits Trace bilateral edema Neuro non-focal Skin warm and dry No muscular weakness   Current Outpatient Prescriptions  Medication Sig Dispense Refill  . acetaminophen (TYLENOL) 500 MG tablet Take 500 mg by mouth every  6 (six) hours as needed.       Marland Kitchen aspirin 325 MG tablet Take 325 mg by mouth daily.        . clonazePAM (KLONOPIN) 0.5 MG tablet Take 0.5 mg by mouth daily as needed.       . DULoxetine (CYMBALTA) 60 MG capsule Take 60 mg by mouth daily.        . furosemide (LASIX) 40 MG tablet Take 40 mg by mouth Daily.       Marland Kitchen HYDROcodone-acetaminophen (VICODIN) 5-500 MG per tablet Take 1 tablet by mouth every 6 (six) hours as needed.      Marland Kitchen levothyroxine (SYNTHROID, LEVOTHROID) 200 MCG tablet Take 200 mcg by mouth daily before breakfast.      . lisinopril (PRINIVIL,ZESTRIL) 10 MG tablet Take 1 tablet (10 mg total) by mouth daily.  60 tablet  6  . metFORMIN (GLUCOPHAGE) 500 MG tablet Take 500 mg by mouth every evening.       . NON FORMULARY CPAP       . omeprazole (PRILOSEC) 20 MG capsule Take 20 mg by mouth at bedtime.       . Rivaroxaban (XARELTO) 20 MG TABS Take 20 mg by mouth daily.  30 tablet  11  . simvastatin (ZOCOR) 40 MG tablet TAKE 1 TABLET BY MOUTH EVERY EVENING  90 tablet  3  . ferrous sulfate 325 (65 FE) MG EC tablet Take 1 tablet (325 mg total) by mouth 3 (three) times daily.  90 tablet  6  . metoprolol tartrate (LOPRESSOR) 25 MG tablet Take 1 tablet (25 mg total) by mouth 2 (two) times daily.  60 tablet  6  . [DISCONTINUED] ONGLYZA 5 MG TABS tablet Take 5 mg by mouth daily.        No current facility-administered medications for this visit.    Allergies  Review of patient's allergies indicates no known allergies.  Electrocardiogram:  SR rate 80 QT 404  Otherwise normal  Assessment and Plan

## 2012-11-02 NOTE — Assessment & Plan Note (Signed)
Functional from obesity.  Refer to cardiac rehab.  Discussed exercising with swimming and recumbant bike

## 2012-11-25 ENCOUNTER — Telehealth: Payer: Self-pay | Admitting: *Deleted

## 2012-11-25 NOTE — Telephone Encounter (Signed)
DR DALTON-BETHEA NEEDS THIS PATIENT NEEDS TO STOP XERELTO FOR 6 DAYS PRIOR TO INJECTION.   DR OFFICE FAXED A FORM TO BE FILLED OUT ASAP. [phone].  COPY OF FORM IN DR Eden Emms FOLDER AT [place] AND FAXED TO [address]

## 2012-11-25 NOTE — Telephone Encounter (Signed)
Will forward to [place].

## 2012-12-10 ENCOUNTER — Encounter: Payer: Self-pay | Admitting: *Deleted

## 2012-12-10 NOTE — Patient Instructions (Signed)
Clearance letter from Dr Eduard Clos to hold Xarelto prior to spinal injection was faxed back to her office.  Pt only needs to hold Xarelto 48hrs prior to injection due to pharmakenetics of drug.  Pt does not need to be bridged with lovenox.  Pt can resume Xarelto night of procedure.  Discussed with Weston Brass Pharm-D.

## 2012-12-11 NOTE — Telephone Encounter (Signed)
NOTE OTHER  ENTRY  IN OTHER NOTE .[name] [name]

## 2013-01-04 ENCOUNTER — Other Ambulatory Visit: Payer: Self-pay | Admitting: Cardiology

## 2013-04-16 ENCOUNTER — Other Ambulatory Visit: Payer: Self-pay | Admitting: Cardiology

## 2013-05-10 ENCOUNTER — Encounter: Payer: Self-pay | Admitting: Cardiovascular Disease

## 2013-05-10 ENCOUNTER — Ambulatory Visit (INDEPENDENT_AMBULATORY_CARE_PROVIDER_SITE_OTHER): Payer: Medicare Other | Admitting: Cardiovascular Disease

## 2013-05-10 VITALS — BP 153/98 | HR 73 | Ht 71.0 in | Wt 326.0 lb

## 2013-05-10 DIAGNOSIS — E119 Type 2 diabetes mellitus without complications: Secondary | ICD-10-CM

## 2013-05-10 DIAGNOSIS — I351 Nonrheumatic aortic (valve) insufficiency: Secondary | ICD-10-CM

## 2013-05-10 DIAGNOSIS — Z9889 Other specified postprocedural states: Secondary | ICD-10-CM

## 2013-05-10 DIAGNOSIS — Z952 Presence of prosthetic heart valve: Secondary | ICD-10-CM

## 2013-05-10 DIAGNOSIS — Z954 Presence of other heart-valve replacement: Secondary | ICD-10-CM

## 2013-05-10 DIAGNOSIS — Z0389 Encounter for observation for other suspected diseases and conditions ruled out: Secondary | ICD-10-CM

## 2013-05-10 DIAGNOSIS — R0602 Shortness of breath: Secondary | ICD-10-CM

## 2013-05-10 DIAGNOSIS — I359 Nonrheumatic aortic valve disorder, unspecified: Secondary | ICD-10-CM

## 2013-05-10 DIAGNOSIS — I059 Rheumatic mitral valve disease, unspecified: Secondary | ICD-10-CM

## 2013-05-10 DIAGNOSIS — I340 Nonrheumatic mitral (valve) insufficiency: Secondary | ICD-10-CM

## 2013-05-10 NOTE — Assessment & Plan Note (Signed)
Discussed low carb diet and weight loss.  [name] [name] has helped  A1c's have been way too high F/U primary

## 2013-05-10 NOTE — Assessment & Plan Note (Signed)
S/P tissue AVR with MV repair SBE prophylaxis See Cherlyn Cushing for oral care  Echo last year fine with no change in murmur Consider echo in a year

## 2013-05-10 NOTE — Progress Notes (Signed)
Patient ID: [name] [name], male   DOB: 1951/02/20, 62 y.o.   MRN: [id number] 62 yo patient of Dr [name] [name]. Last seen in March for F/U valves. AVR and mitral valve repair secondary to prior history of endocarditis. Followup bedside echocardiogram last office visit 6/12 demonstrated normal LV function and normal functioning valves. Gradients across AVR mean 9 mmHg and peak 17 mmHg From a cardiac standpoint the patient is doing well. He has no chest pain. He still has some dyspnea but this has been a chronic problem in part due to deconditioning and being overweight. He has no clinical symptoms or signs of heart failure however. He does complain of some fatigue although this is gradually improving Long discussion with patient about diet , and weight loss.  04/10/11 AVR/MV repair [name] [name]  PREOPERATIVE DIAGNOSES:  1. Severe aortic insufficiency.  2. Moderate mitral regurgitation.  POSTOPERATIVE DIAGNOSES:  1. Severe aortic insufficiency.  2. Moderate mitral regurgitation.  PROCEDURE: Median sternotomy for aortic valve replacement (25 mm  [facility] Ease pericardial tissue valve) and mitral valve repair  (complex valvuloplasty including autologous pericardial patch repair of  posterior leaflet with plication of posterior commissure and 28 mm Memo 3D  ring annuloplasty) and placement of left subclavian central venous line and right heart catheterization (Swan-Ganz catheter placement).  Cath prior to surgery with no CAD. PA pressure elevated in 60 range thought passive secondary to servere AR and MR  Echo 2013  Study Conclusions  - Left ventricle: The cavity size was normal. Wall thickness was increased in a pattern of mild LVH. Systolic function was normal. The estimated ejection fraction was in the range of 60% to 65%. Wall motion was normal; there were no regional wall motion abnormalities. The study is not technically sufficient to allow evaluation of LV diastolic function. - Aortic valve:  Bioprosthetic aortic valve with apparent conduit noted, leaflets not well defined. No obvious perivalvular leak. Appears somewhat undersized to root but gradients are stable. No significant regurgitation. Mean gradient: 11mm Hg (S). - Mitral valve: Mildly to moderatelythickened leaflets with annular thickening - consistent with reported mitral repair and annuloplasty. Mean gradient: 11mm Hg (D). Prominent diastolic jet, presumably across mitral valve, noted well back into LV - cannot exclude functional mitral stenosis. Interrogation was failrly limited. - Left atrium: The atrium was severely dilated. - Tricuspid valve: Trivial regurgitation. - Pulmonary arteries: Systolic pressure could not be accurately estimated. - Pericardium, extracardiac: There was no pericardial effusion.  Patient has had multiple DVT;s and IVC filter placed. On chronic xarelto. Apparantly saw heme before but no definite diagnosis of hypercoagulability. Teeth in bad shape but seeing Dr [name] [name] oral surgeon in [place] and does SBE prophylaxis  Has exertional dyspnea and LE leg pain with prolonged standing. Sedentary Invocana has helped with sugar but diet still poor Needs f/u colonoscopy in 2 years with previous polyps      ROS: Denies fever, malais, weight loss, blurry vision, decreased visual acuity, cough, sputum, SOB, hemoptysis, pleuritic pain, palpitaitons, heartburn, abdominal pain, melena, lower extremity edema, claudication, or rash.  All other systems reviewed and negative  General: Affect appropriate Obese white male  HEENT: normal Neck supple with no adenopathy JVP normal no bruits no thyromegaly Lungs clear with no wheezing and good diaphragmatic motion Heart:  S1/S2  SEM  murmur, no rub, gallop or click PMI normal Abdomen: benighn, BS positve, no tenderness, no AAA no bruit.  No HSM or HJR Distal pulses intact with no bruits Plus one bilateral  edema with large varicose veins Left  greater than right  Neuro non-focal Skin warm and dry No muscular weakness   Current Outpatient Prescriptions  Medication Sig Dispense Refill  . acetaminophen (TYLENOL) 500 MG tablet Take 500 mg by mouth every 6 (six) hours as needed.      Marland Kitchen aspirin 325 MG tablet Take 325 mg by mouth daily.        . clonazePAM (KLONOPIN) 0.5 MG tablet Take 0.5 mg by mouth daily as needed.       . DULoxetine (CYMBALTA) 60 MG capsule Take 60 mg by mouth daily.        . furosemide (LASIX) 40 MG tablet Take 40 mg by mouth Daily.       Marland Kitchen HYDROcodone-acetaminophen (VICODIN) 5-500 MG per tablet Take 1 tablet by mouth every 6 (six) hours as needed.      Marland Kitchen levothyroxine (SYNTHROID, LEVOTHROID) 200 MCG tablet Take 400 mcg by mouth daily before breakfast.       . lisinopril (PRINIVIL,ZESTRIL) 10 MG tablet take 1 tablet by mouth once daily  30 tablet  6  . metFORMIN (GLUCOPHAGE) 500 MG tablet Take 500 mg by mouth every evening.       . metoprolol tartrate (LOPRESSOR) 25 MG tablet take 1 tablet by mouth daily      . NON FORMULARY CPAP       . omeprazole (PRILOSEC) 20 MG capsule Take 20 mg by mouth at bedtime.       . Rivaroxaban (XARELTO) 20 MG TABS Take 20 mg by mouth daily.  30 tablet  11  . simvastatin (ZOCOR) 40 MG tablet Take 1 tablet (40 mg total) by mouth at bedtime.  90 tablet  3  . [DISCONTINUED] ONGLYZA 5 MG TABS tablet Take 5 mg by mouth daily.        No current facility-administered medications for this visit.    Allergies  Review of patient's allergies indicates no known allergies.  Electrocardiogram: 5/14 SR rate 80 QT 404 msec  Assessment and Plan

## 2013-05-10 NOTE — Patient Instructions (Signed)
Your physician recommends that you schedule a follow-up appointment in: 6 MONTHS  Your physician recommends THAT YOU BE SET UP FOR CARDIAC REHAB A staff member from our office will alert you the with appointment date and time, once available

## 2013-05-10 NOTE — Assessment & Plan Note (Signed)
Well controlled.  Continue current medications and low sodium Dash type diet.    

## 2013-05-10 NOTE — Assessment & Plan Note (Signed)
Encouraged him to see vein specialist to see if they can be sclerosed.  Support hose as needed

## 2013-05-10 NOTE — Assessment & Plan Note (Signed)
Functional due to obesity  Will refer to cardiac rehab

## 2013-06-11 ENCOUNTER — Other Ambulatory Visit: Payer: Self-pay | Admitting: *Deleted

## 2013-06-11 DIAGNOSIS — I351 Nonrheumatic aortic (valve) insufficiency: Secondary | ICD-10-CM

## 2013-06-11 DIAGNOSIS — I340 Nonrheumatic mitral (valve) insufficiency: Secondary | ICD-10-CM

## 2013-07-05 ENCOUNTER — Telehealth: Payer: Self-pay | Admitting: *Deleted

## 2013-07-05 ENCOUNTER — Other Ambulatory Visit: Payer: Self-pay | Admitting: *Deleted

## 2013-07-05 NOTE — Telephone Encounter (Signed)
Samples at front to pick

## 2013-07-05 NOTE — Telephone Encounter (Signed)
PT IS GOING TO STOP BY TO PICK UP XARELTO  20 MG SAMPLES, HE IS SLSO SENDING A FORM THAT NEEDS SIGNED BY DR MCDOWELL TO GET ASSISTANCE PAYING FOR THIS MEDICATION. HE INSURANCE HAS CHANGED AND HE IS OUT OF MEDICATION.

## 2013-08-05 ENCOUNTER — Ambulatory Visit (INDEPENDENT_AMBULATORY_CARE_PROVIDER_SITE_OTHER): Payer: Medicare Other | Admitting: Adult Health

## 2013-08-05 ENCOUNTER — Encounter: Payer: Self-pay | Admitting: Adult Health

## 2013-08-05 VITALS — BP 149/77 | HR 75 | Ht 71.0 in | Wt 331.5 lb

## 2013-08-05 DIAGNOSIS — I10 Essential (primary) hypertension: Secondary | ICD-10-CM

## 2013-08-05 DIAGNOSIS — I97190 Other postprocedural cardiac functional disturbances following cardiac surgery: Secondary | ICD-10-CM

## 2013-08-05 DIAGNOSIS — R0602 Shortness of breath: Secondary | ICD-10-CM

## 2013-08-05 DIAGNOSIS — Z952 Presence of prosthetic heart valve: Secondary | ICD-10-CM

## 2013-08-05 DIAGNOSIS — Z954 Presence of other heart-valve replacement: Secondary | ICD-10-CM

## 2013-08-05 DIAGNOSIS — T8209XA Other mechanical complication of heart valve prosthesis, initial encounter: Secondary | ICD-10-CM

## 2013-08-05 NOTE — Assessment & Plan Note (Signed)
He will have an echo as discussed. He will follow up in [place] with Dr. Eden EmmsNishan at his request.

## 2013-08-05 NOTE — Patient Instructions (Signed)
Your physician recommends that you schedule a follow-up appointment in: 1 month with Dr [place].  Your physician has requested that you have an echocardiogram. Echocardiography is a painless test that uses sound waves to create images of your heart. It provides your doctor with information about the size and shape of your heart and how well your heart's chambers and valves are working. This procedure takes approximately one hour. There are no restrictions for this procedure.   Please have your blood drawn next week. These labs will be fasting, nothing to eat or drink after midnight. BMET CBC HGB a1C Lipids, LFTs

## 2013-08-05 NOTE — Assessment & Plan Note (Signed)
He brings with him a list of BP at home ranging from 158/92 to 148/96. Have done orthostatic BP here in the office.  He is negative, BP lyig 130/84, Sitting 130/89; Standing 131/85.  I will check a CBC and BMET. Echocardiogram for LV fx and evaluation of prosthetic valves.

## 2013-08-05 NOTE — Progress Notes (Signed)
HPI: Mr. [name] [name] is a 63 year old patient who be est. with Dr. Wyline MoodBranch, we are following for ongoing assessment and management of CAD, severe aortic insufficiency, status post aortic valve replacement using a 25 mm Edwards magna ease pericardial tissue valve, and mitral valve repair with complex valvuloplasty including autolgous or cardio patch repair of the posterior leaflet using a 28 mm Memo- 3-D ring annuloplasty. This was completed on October of 2012.  He was last seen in the office by Dr. Eden EmmsNishan on 05/10/2013 and was stable from a cardiac standpoint. At some mild complaints of fatigue which was gradually improving. He was advised on increasing his exercise, losing weight, and appearing to heart healthy diet.  The patient was continued on Xarelto, with a history of multiple DVTs and IVC filter placed. He also has a history of varicosities of the lower extremity and was advised to see a vein specialists for evaluation and treatment.  He comes today with complaints of increase BP, headaches, some dizziness, and epistaxis. He is concerned because he is feeling more tired than usual. He has reently started on Onglyza per PCP for diabetes control. Symptoms began shortly after taking this medication. He read some of the side effects and is concerned that those listed are occuring with him. Specifically dizziness and fatigue.  He denies chest pain, palpitations. He admits that his weight is increasing.   No Known Allergies  Current Outpatient Prescriptions  Medication Sig Dispense Refill  . acetaminophen (TYLENOL) 500 MG tablet Take 500 mg by mouth every 6 (six) hours as needed.      Marland Kitchen. aspirin 325 MG tablet Take 325 mg by mouth daily.        . clonazePAM (KLONOPIN) 0.5 MG tablet Take 0.5 mg by mouth daily as needed.       . DULoxetine (CYMBALTA) 60 MG capsule Take 60 mg by mouth daily.        . furosemide (LASIX) 40 MG tablet Take 40 mg by mouth Daily.       Marland Kitchen. levothyroxine (SYNTHROID,  LEVOTHROID) 200 MCG tablet Take 400 mcg by mouth daily before breakfast.       . lisinopril (PRINIVIL,ZESTRIL) 10 MG tablet take 1 tablet by mouth once daily  30 tablet  6  . metFORMIN (GLUCOPHAGE) 500 MG tablet Take 500 mg by mouth every evening.       . metoprolol tartrate (LOPRESSOR) 25 MG tablet take 1 tablet by mouth daily      . NON FORMULARY CPAP       . omeprazole (PRILOSEC) 20 MG capsule Take 20 mg by mouth at bedtime.       . Rivaroxaban (XARELTO) 20 MG TABS Take 20 mg by mouth daily.  30 tablet  11  . simvastatin (ZOCOR) 40 MG tablet Take 1 tablet (40 mg total) by mouth at bedtime.  90 tablet  3  . FLUoxetine (PROZAC) 20 MG capsule       . HYDROcodone-acetaminophen (VICODIN) 5-500 MG per tablet Take 1 tablet by mouth every 6 (six) hours as needed.      . INVOKANA 100 MG TABS       . [DISCONTINUED] ONGLYZA 5 MG TABS tablet Take 5 mg by mouth daily.        No current facility-administered medications for this visit.    Past Medical History  Diagnosis Date  . Obesity   . Hypertension   . OSA (obstructive sleep apnea)     CPAP  . Depression   .  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 Dyspnea   . Diabetes mellitus   . Hyperlipidemia   . Coronary artery disease (CAD) excluded 2008, 2011    Mild coronary plaque.  w/ normal LV function. repeated cardiac cath on 07/2009  . 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 History of endocarditis     status post Enterococcus faecalis, secondary to chronic injections with low molecular weight heparin  . Deep venous thrombosis     recurrent DVT and PE, status post IVC filter    Past Surgical History  Procedure Laterality Date  . Thyroidectomy    . Cardiac catheterization  2008  . Cardiac catheterization  07/2009    minor irregulreties without obstructive disease  . Mitral valve repair   04/10/2011    autologous pericardial patch augmentation of posterior leaflet with 28mm Sorin Memo 3D ring annuloplasty  . Aortic valve replacement  04/10/2011    25mm [facility] Ease pericardial bioprosthetic aortic valve    RUE:AVWUJW of systems complete and found to be negative unless listed above  PHYSICAL EXAM BP 149/77  Pulse 75  Ht 5\' 11"  (1.803 m)  Wt 331 lb 8 oz (150.367 kg)  BMI 46.26 kg/m2  General: Well developed, well nourished, in no acute distress, obese.  Head: Eyes PERRLA, No xanthomas.   Normal cephalic and atramatic  Lungs: Clear bilaterally to auscultation and percussion. Heart: HRRR S1 S2, without MRG.  Pulses are 2+ & equal.            No carotid bruit. No JVD.  No abdominal bruits. No femoral bruits. Abdomen: Bowel sounds are positive, abdomen soft and non-tender without masses or  Hernia's noted. Msk:  Back normal, normal gait. Normal strength and tone for age. Extremities: No clubbing, cyanosis or edema.  DP +1 Neuro: Alert and oriented X 3. Psych:  Good affect, responds appropriately    ASSESSMENT AND PLAN

## 2013-08-05 NOTE — Progress Notes (Deleted)
Name: [name] [name]    DOB: 11/08/1950  Age: 63 y.o.  MR#: [id number]       PCP:  Louie Boston, MD      Insurance: Payor: [employer] MEDICARE / Plan: AARP MEDICARE COMPLETE / Product Type: *No Product type* /   CC:    Chief Complaint  Patient presents with  . Coronary Artery Disease  . Aortic Insuffiency    S/P Aortic Valve Replacement    VS Filed Vitals:   08/05/13 1438  BP: 149/77  Pulse: 75  Height: 5\' 11"  (1.803 m)  Weight: 331 lb 8 oz (150.367 kg)    Weights Current Weight  08/05/13 331 lb 8 oz (150.367 kg)  05/10/13 326 lb (147.873 kg)  11/02/12 329 lb 4 oz (149.347 kg)    Blood Pressure  BP Readings from Last 3 Encounters:  08/05/13 149/77  05/10/13 153/98  11/02/12 138/69     Admit date:  (Not on file) Last encounter with RMR:  Visit date not found   Allergy Review of patient's allergies indicates no known allergies.  Current Outpatient Prescriptions  Medication Sig Dispense Refill  . acetaminophen (TYLENOL) 500 MG tablet Take 500 mg by mouth every 6 (six) hours as needed.      Marland Kitchen aspirin 325 MG tablet Take 325 mg by mouth daily.        . clonazePAM (KLONOPIN) 0.5 MG tablet Take 0.5 mg by mouth daily as needed.       . DULoxetine (CYMBALTA) 60 MG capsule Take 60 mg by mouth daily.        . furosemide (LASIX) 40 MG tablet Take 40 mg by mouth Daily.       Marland Kitchen levothyroxine (SYNTHROID, LEVOTHROID) 200 MCG tablet Take 400 mcg by mouth daily before breakfast.       . lisinopril (PRINIVIL,ZESTRIL) 10 MG tablet take 1 tablet by mouth once daily  30 tablet  6  . metFORMIN (GLUCOPHAGE) 500 MG tablet Take 500 mg by mouth every evening.       . metoprolol tartrate (LOPRESSOR) 25 MG tablet take 1 tablet by mouth daily      . NON FORMULARY CPAP       . omeprazole (PRILOSEC) 20 MG capsule Take 20 mg by mouth at bedtime.       . Rivaroxaban (XARELTO) 20 MG TABS Take 20 mg by mouth daily.  30 tablet  11  . simvastatin (ZOCOR) 40 MG tablet Take 1 tablet (40 mg total)  by mouth at bedtime.  90 tablet  3  . FLUoxetine (PROZAC) 20 MG capsule       . HYDROcodone-acetaminophen (VICODIN) 5-500 MG per tablet Take 1 tablet by mouth every 6 (six) hours as needed.      . INVOKANA 100 MG TABS       . [DISCONTINUED] ONGLYZA 5 MG TABS tablet Take 5 mg by mouth daily.        No current facility-administered medications for this visit.    Discontinued Meds:   There are no discontinued medications.  Patient Active Problem List   Diagnosis Date Noted  . Diabetes 11/02/2012  . Varicose vein 01/24/2012  . RLS (restless legs syndrome) 07/11/2011  . Seroma 07/07/2011  . Deep venous thrombosis   . History of endocarditis   . Coronary artery disease (CAD) excluded   . DVT (deep vein thrombosis) in pregnancy   . Extrasystole 04/30/2011  . S/P AVR (aortic valve replacement) 04/10/2011  . S/P mitral  valve repair 04/10/2011  . Mitral regurgitation 03/29/2011  . Aortic insufficiency   . Dyspnea 11/15/2010  . ENDOCARDITIS 03/20/2010  . SHORTNESS OF BREATH 07/21/2009  . OVERWEIGHT/OBESITY 02/20/2009  . DEPRESSION 02/20/2009  . SLEEP APNEA, OBSTRUCTIVE 02/20/2009  . HYPERTENSION, UNSPECIFIED 02/20/2009  . SKIN LESION 02/20/2009  . CHEST PAIN-UNSPECIFIED 02/20/2009  . TRAUMATIC COMPARTMENT SYNDROME LOWER EXTREMITY 02/20/2009    LABS    Component Value Date/Time   NA 137 01/24/2012 1150   NA 136 04/13/2011 0615   NA 134* 04/12/2011 0504   K 5.2 01/24/2012 1150   K 3.7 04/13/2011 0615   K 4.1 04/12/2011 0504   CL 102 01/24/2012 1150   CL 99 04/13/2011 0615   CL 98 04/12/2011 0504   CO2 26 01/24/2012 1150   CO2 26 04/13/2011 0615   CO2 26 04/12/2011 0504   GLUCOSE 112* 01/24/2012 1150   GLUCOSE 88 04/13/2011 0615   GLUCOSE 146* 04/12/2011 0504   BUN 14 01/24/2012 1150   BUN 28* 04/13/2011 0615   BUN 16 04/12/2011 0504   CREATININE 1.11 01/24/2012 1150   CREATININE 1.18 04/13/2011 0615   CREATININE 1.20 04/12/2011 0504   CREATININE 1.20 04/11/2011 1726   CALCIUM 9.1  01/24/2012 1150   CALCIUM 8.5 04/13/2011 0615   CALCIUM 8.6 04/12/2011 0504   GFRNONAA 65* 04/13/2011 0615   GFRNONAA 64* 04/12/2011 0504   GFRNONAA 74* 04/11/2011 1715   GFRAA 75* 04/13/2011 0615   GFRAA 74* 04/12/2011 0504   GFRAA 86* 04/11/2011 1715   CMP     Component Value Date/Time   NA 137 01/24/2012 1150   K 5.2 01/24/2012 1150   CL 102 01/24/2012 1150   CO2 26 01/24/2012 1150   GLUCOSE 112* 01/24/2012 1150   BUN 14 01/24/2012 1150   CREATININE 1.11 01/24/2012 1150   CREATININE 1.18 04/13/2011 0615   CALCIUM 9.1 01/24/2012 1150   PROT 6.9 04/09/2011 1915   ALBUMIN 3.5 04/09/2011 1915   AST 24 04/09/2011 1915   ALT 32 04/09/2011 1915   ALKPHOS 99 04/09/2011 1915   BILITOT 0.3 04/09/2011 1915   GFRNONAA 65* 04/13/2011 0615   GFRAA 75* 04/13/2011 0615       Component Value Date/Time   WBC 6.2 01/24/2012 1150   WBC 11.6* 04/13/2011 0615   WBC 16.6* 04/12/2011 0504   HGB 12.3* 01/24/2012 1150   HGB 8.7* 04/13/2011 0615   HGB 9.2* 04/12/2011 0504   HCT 37.1* 01/24/2012 1150   HCT 28.6* 04/13/2011 0615   HCT 29.8* 04/12/2011 0504   MCV 77.9* 01/24/2012 1150   MCV 78.6 04/13/2011 0615   MCV 78.4 04/12/2011 0504    Lipid Panel  No results found for this basename: chol, trig, hdl, cholhdl, vldl, ldlcalc    ABG    Component Value Date/Time   PHART 7.375 04/10/2011 2018   PCO2ART 38.9 04/10/2011 2018   PO2ART 69.0* 04/10/2011 2018   HCO3 22.9 04/10/2011 2018   TCO2 23 04/11/2011 1726   ACIDBASEDEF 2.0 04/10/2011 2018   O2SAT 93.0 04/10/2011 2018     Lab Results  Component Value Date   TSH 1.630 ***Test methodology is 3rd generation TSH**** 02/21/2009   BNP (last 3 results) No results found for this basename: PROBNP,  in the last 8760 hours Cardiac Panel (last 3 results) No results found for this basename: CKTOTAL, CKMB, TROPONINI, RELINDX,  in the last 72 hours  Iron/TIBC/Ferritin No results found for this basename: iron, tibc, ferritin  EKG Orders placed in visit on  11/02/12  . EKG 12-LEAD     Prior Assessment and Plan Problem List as of 08/05/2013   OVERWEIGHT/OBESITY   Last Assessment & Plan   01/24/2012 Office Visit Written 01/24/2012 11:47 AM by Wendall Stade, MD     Discussed low carb diet and exercise program.      DEPRESSION   SLEEP APNEA, OBSTRUCTIVE   Last Assessment & Plan   07/11/2011 Office Visit Written 07/11/2011  1:01 PM by Barbaraann Share, MD     The patient has a history of mild obstructive sleep apnea, but it appears to be well controlled at this time with his current CPAP settings.  I do wonder if he may be having a pressure leak through the mouth opening, but will address his leg movements first since this seems to be his overwhelming sleep issue currently.  I have encouraged him to work aggressively on weight loss.    HYPERTENSION, UNSPECIFIED   Last Assessment & Plan   05/10/2013 Office Visit Written 05/10/2013 10:22 AM by Wendall Stade, MD     Well controlled.  Continue current medications and low sodium Dash type diet.       ENDOCARDITIS   Last Assessment & Plan   04/30/2011 Office Visit Written 04/30/2011 10:50 AM by June Leap, MD     No recurrent fevers or chills.    SKIN LESION   SHORTNESS OF BREATH   Last Assessment & Plan   05/10/2013 Office Visit Written 05/10/2013 10:23 AM by Wendall Stade, MD     Functional due to obesity  Will refer to cardiac rehab    CHEST PAIN-UNSPECIFIED   Last Assessment & Plan   11/23/2010 Office Visit Written 11/23/2010  5:19 PM by Iran Ouch, MD     The patient had recent episodes of chest tightness associated with dyspnea. It is mainly exertional at its likely due to congestive heart failure and not coronary artery disease. His most recent cardiac catheterization was in February of 2011 and showed no significant coronary artery disease. Thus, I will not repeat ischemic evaluation at this time.    TRAUMATIC COMPARTMENT SYNDROME LOWER EXTREMITY   Dyspnea   Last Assessment & Plan    01/24/2012 Office Visit Edited 01/24/2012 11:52 AM by Wendall Stade, MD     Functional.  Will check echo to reassess PA pressure and valves. Check labs to include BMET, CBC and BNP    Aortic insufficiency   Last Assessment & Plan   05/10/2013 Office Visit Written 05/10/2013 10:24 AM by Wendall Stade, MD     S/P tissue AVR with MV repair SBE prophylaxis See Cherlyn Cushing for oral care  Echo last year fine with no change in murmur Consider echo in a year    Mitral regurgitation   Last Assessment & Plan   04/30/2011 Office Visit Written 04/30/2011 10:51 AM by June Leap, MD     Limited bedside echocardiogram shows no mitral regurgitation. No pathological murmur on exam. No pericardial effusion is noted.    Extrasystole   Last Assessment & Plan   04/30/2011 Office Visit Written 04/30/2011 10:52 AM by June Leap, MD     Extrasystoles heart on physical examination. Will obtain electrocardiogram to rule out atrial fibrillation.    S/P AVR (aortic valve replacement)   Last Assessment & Plan   11/02/2012 Office Visit Written 11/02/2012  2:17 PM by Noralyn Pick  Eden Emms, MD     No change in exam Follows SBE AVR and MVrepair looked good on echo last year    S/P mitral valve repair   Deep venous thrombosis   History of endocarditis   Coronary artery disease (CAD) excluded   DVT (deep vein thrombosis) in pregnancy   Last Assessment & Plan   11/02/2012 Office Visit Written 11/02/2012  2:19 PM by Wendall Stade, MD     Multiple on chronic xarelto.  Cr normal Discussed need for CT/imaging for any major trauma.      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  He has been tried on Mirapex in the past, but it appears that his dose was never  titrated upward.  He has also been tried on horizant.  He currently is on Klonopin as needed, however studies have shown this is not an effective treatment for RLS.  At this point, I would like to try him on a different dopamine agonist, and see if we can push the dose higher.  The patient has iron deficiency anemia, and this is one of the major causes of RLS.  The patient is also on an antidepressant medication, and these can sometimes augment symptoms.  If the patient fails to improve with standard therapy, I would begin to consider whether this is truly periodic limb movements.  Other considerations would include myoclonic jerking which may have a neurologic origin.  There is really nothing to suggest a REM behavior disorder at this time.    Varicose vein   Last Assessment & Plan   05/10/2013 Office Visit Written 05/10/2013 10:22 AM by Wendall Stade, MD     Encouraged him to see vein specialist to see if they can be sclerosed.  Support hose as needed    Diabetes   Last Assessment & Plan   05/10/2013 Office Visit Written 05/10/2013 10:24 AM by Wendall Stade, MD     Discussed low carb diet and weight loss.  Marja Kays has helped  A1c's have been way too high F/U primary        Imaging: No results found.

## 2013-08-05 NOTE — Assessment & Plan Note (Signed)
I think this is related to deconditioning and weight. I will check a CBC to evaluate for bleeding on xarelto.

## 2013-08-13 ENCOUNTER — Telehealth: Payer: Self-pay | Admitting: *Deleted

## 2013-08-13 ENCOUNTER — Inpatient Hospital Stay (HOSPITAL_COMMUNITY): Admission: RE | Admit: 2013-08-13 | Payer: Medicare Other | Source: Ambulatory Visit

## 2013-08-13 LAB — BASIC METABOLIC PANEL
BUN: 14 mg/dL (ref 6–23)
CHLORIDE: 101 meq/L (ref 96–112)
CO2: 27 meq/L (ref 19–32)
CREATININE: 0.82 mg/dL (ref 0.50–1.35)
Calcium: 8.4 mg/dL (ref 8.4–10.5)
Glucose, Bld: 134 mg/dL — ABNORMAL HIGH (ref 70–99)
POTASSIUM: 5.1 meq/L (ref 3.5–5.3)
SODIUM: 137 meq/L (ref 135–145)

## 2013-08-13 LAB — LIPID PANEL
CHOL/HDL RATIO: 2.7 ratio
CHOLESTEROL: 131 mg/dL (ref 0–200)
HDL: 48 mg/dL (ref >39)
LDL Cholesterol: 59 mg/dL (ref 0–99)
TRIGLYCERIDES: 122 mg/dL (ref <150)
VLDL: 24 mg/dL (ref 0–40)

## 2013-08-13 LAB — HEPATIC FUNCTION PANEL
ALBUMIN: 3.9 g/dL (ref 3.5–5.2)
ALT: 25 U/L (ref 0–53)
AST: 21 U/L (ref 0–37)
Alkaline Phosphatase: 78 U/L (ref 39–117)
Bilirubin, Direct: 0.1 mg/dL (ref 0.0–0.3)
Indirect Bilirubin: 0.3 mg/dL (ref 0.2–1.2)
TOTAL PROTEIN: 6.6 g/dL (ref 6.0–8.3)
Total Bilirubin: 0.4 mg/dL (ref 0.2–1.2)

## 2013-08-13 LAB — CBC
HCT: 37 % — ABNORMAL LOW (ref 39.0–52.0)
Hemoglobin: 11.7 g/dL — ABNORMAL LOW (ref 13.0–17.0)
MCH: 23.3 pg — ABNORMAL LOW (ref 26.0–34.0)
MCHC: 31.6 g/dL (ref 30.0–36.0)
MCV: 73.6 fL — AB (ref 78.0–100.0)
PLATELETS: 210 10*3/uL (ref 150–400)
RBC: 5.03 MIL/uL (ref 4.22–5.81)
RDW: 17.4 % — AB (ref 11.5–15.5)
WBC: 5.5 10*3/uL (ref 4.0–10.5)

## 2013-08-13 MED ORDER — LISINOPRIL 10 MG PO TABS: ORAL_TABLET | ORAL | Status: DC

## 2013-08-13 NOTE — Telephone Encounter (Signed)
Medication sent via escribe.  

## 2013-08-13 NOTE — Telephone Encounter (Signed)
Lisinopril needs called in to [facility]

## 2013-08-14 LAB — HEMOGLOBIN A1C
Hgb A1c MFr Bld: 7.7 % — ABNORMAL HIGH (ref <5.7)
MEAN PLASMA GLUCOSE: 174 mg/dL — AB (ref <117)

## 2013-08-19 ENCOUNTER — Inpatient Hospital Stay (HOSPITAL_COMMUNITY): Admission: RE | Admit: 2013-08-19 | Payer: Medicare Other | Source: Ambulatory Visit

## 2013-08-24 ENCOUNTER — Ambulatory Visit (HOSPITAL_COMMUNITY): Payer: Medicare Other

## 2013-08-26 ENCOUNTER — Ambulatory Visit (HOSPITAL_COMMUNITY)
Admission: RE | Admit: 2013-08-26 | Discharge: 2013-08-26 | Disposition: A | Discharge status: Home / Self Care | Payer: Medicare Other | Source: Ambulatory Visit | Attending: Adult Health | Admitting: Adult Health

## 2013-08-26 ENCOUNTER — Telehealth: Payer: Self-pay

## 2013-08-26 ENCOUNTER — Encounter (INDEPENDENT_AMBULATORY_CARE_PROVIDER_SITE_OTHER): Payer: Self-pay

## 2013-08-26 DIAGNOSIS — Z954 Presence of other heart-valve replacement: Secondary | ICD-10-CM | POA: Insufficient documentation

## 2013-08-26 DIAGNOSIS — I2510 Atherosclerotic heart disease of native coronary artery without angina pectoris: Secondary | ICD-10-CM | POA: Insufficient documentation

## 2013-08-26 DIAGNOSIS — I519 Heart disease, unspecified: Secondary | ICD-10-CM | POA: Insufficient documentation

## 2013-08-26 DIAGNOSIS — R0609 Other forms of dyspnea: Secondary | ICD-10-CM | POA: Insufficient documentation

## 2013-08-26 DIAGNOSIS — Z6841 Body Mass Index (BMI) 40.0 and over, adult: Secondary | ICD-10-CM | POA: Insufficient documentation

## 2013-08-26 DIAGNOSIS — R0602 Shortness of breath: Secondary | ICD-10-CM

## 2013-08-26 DIAGNOSIS — I10 Essential (primary) hypertension: Secondary | ICD-10-CM | POA: Insufficient documentation

## 2013-08-26 DIAGNOSIS — I517 Cardiomegaly: Secondary | ICD-10-CM

## 2013-08-26 DIAGNOSIS — E785 Hyperlipidemia, unspecified: Secondary | ICD-10-CM | POA: Insufficient documentation

## 2013-08-26 DIAGNOSIS — E669 Obesity, unspecified: Secondary | ICD-10-CM | POA: Insufficient documentation

## 2013-08-26 DIAGNOSIS — E119 Type 2 diabetes mellitus without complications: Secondary | ICD-10-CM | POA: Insufficient documentation

## 2013-08-26 DIAGNOSIS — R0989 Other specified symptoms and signs involving the circulatory and respiratory systems: Principal | ICD-10-CM | POA: Insufficient documentation

## 2013-08-26 DIAGNOSIS — T8209XA Other mechanical complication of heart valve prosthesis, initial encounter: Secondary | ICD-10-CM

## 2013-08-26 NOTE — Progress Notes (Signed)
*  PRELIMINARY RESULTS* Echocardiogram 2D Echocardiogram has been performed.  [name] [name] 08/26/2013, 3:28 PM

## 2013-08-26 NOTE — Telephone Encounter (Signed)
Patient had echo today.  Would like to be called about results.  [place] but would like to cancel appointment if not needed to find results are good.  Specifically asked about [facility] NP giving the results.

## 2013-08-26 NOTE — Telephone Encounter (Signed)
Wife answered the phone. Told the wife that the Dr's are busy in clinic in the afternoon and that the echo has not been resulted yet. Also, reminded her that we still like to see pt in office even if they have good results.

## 2013-09-01 ENCOUNTER — Encounter: Payer: Self-pay | Admitting: *Deleted

## 2013-09-03 ENCOUNTER — Ambulatory Visit: Payer: Medicare Other | Admitting: Cardiovascular Disease

## 2013-09-03 ENCOUNTER — Encounter: Payer: Self-pay | Admitting: Adult Health

## 2013-09-03 ENCOUNTER — Ambulatory Visit (INDEPENDENT_AMBULATORY_CARE_PROVIDER_SITE_OTHER): Payer: Medicare Other | Admitting: Adult Health

## 2013-09-03 VITALS — BP 167/90 | HR 71 | Ht 71.0 in | Wt 337.0 lb

## 2013-09-03 DIAGNOSIS — I351 Nonrheumatic aortic (valve) insufficiency: Secondary | ICD-10-CM

## 2013-09-03 DIAGNOSIS — I82409 Acute embolism and thrombosis of unspecified deep veins of unspecified lower extremity: Secondary | ICD-10-CM

## 2013-09-03 DIAGNOSIS — D619 Aplastic anemia, unspecified: Secondary | ICD-10-CM

## 2013-09-03 DIAGNOSIS — Z9889 Other specified postprocedural states: Secondary | ICD-10-CM

## 2013-09-03 DIAGNOSIS — I10 Essential (primary) hypertension: Secondary | ICD-10-CM

## 2013-09-03 DIAGNOSIS — Z954 Presence of other heart-valve replacement: Secondary | ICD-10-CM

## 2013-09-03 DIAGNOSIS — I359 Nonrheumatic aortic valve disorder, unspecified: Secondary | ICD-10-CM

## 2013-09-03 DIAGNOSIS — Z952 Presence of prosthetic heart valve: Secondary | ICD-10-CM

## 2013-09-03 DIAGNOSIS — R0602 Shortness of breath: Secondary | ICD-10-CM

## 2013-09-03 LAB — CBC
HCT: 37.6 % — ABNORMAL LOW (ref 39.0–52.0)
Hemoglobin: 11.9 g/dL — ABNORMAL LOW (ref 13.0–17.0)
MCH: 23.7 pg — ABNORMAL LOW (ref 26.0–34.0)
MCHC: 31.6 g/dL (ref 30.0–36.0)
MCV: 74.9 fL — AB (ref 78.0–100.0)
PLATELETS: 239 10*3/uL (ref 150–400)
RBC: 5.02 MIL/uL (ref 4.22–5.81)
RDW: 17.5 % — AB (ref 11.5–15.5)
WBC: 8.3 10*3/uL (ref 4.0–10.5)

## 2013-09-03 MED ORDER — LISINOPRIL 10 MG PO TABS: 10.0000 mg | ORAL_TABLET | Freq: Two times a day (BID) | ORAL | Status: DC

## 2013-09-03 NOTE — Assessment & Plan Note (Signed)
He remains on Xarelto, 20 mg by mouth daily. He has had some nosebleeds on occasion. He has had a history of anemia his results. Our recheck a CBC to evaluate his status. He thinks it may be related to his overall fatigue.

## 2013-09-03 NOTE — Assessment & Plan Note (Signed)
He is hypertensive today at 167/90. He states that is normally elevated at home to that degree. He admits to eating some salty snacks. He states that he feels bad all over. He does not think he is depressed. However he has no energy, and is upset about his weight gain.  I will increase his lisinopril to 10 mg twice a day. He has been given a blood pressure chart to record his levels on blood pressure cuffs at home.  I will recheck a BMET.

## 2013-09-03 NOTE — Progress Notes (Signed)
HPI: [name] [name] is a medically complicated patient of Dr. Wyline Mood we are following for ongoing assessment and management of CAD, severe aortic insufficiency, status post aortic valve replacement using a 25 mm Edwards magnesium card a tissue valve, mitral valve repair with complex valvuloplasty, including autologous cardio patch repair of the posterior leaflet using a 28 mm memo-3-D ring annuloplasty.  He was last seen in the office in February of 2015 complaints of increased blood pressure, headaches, dizziness, and epistaxis. The patient was continued on Xarelto, with a history of multiple DVTs and IVC filter was placed. On that visit I checked a CBC a BMET and also did orthostatic blood pressures. Blood pressure ranged in the 1:30 to 1:30 1 range systolically over 80-89 diastolically.     Echocardiogram was completed on 08/26/2013, this revealed mild LVH with an LVEF of 60-65%, grade 2 diastolic dysfunction, severe atrial enlargement. Mitral angioplasty with thickened leaflets, no mitral regurg. Somewhat discordant area information, cannot exclude mild to moderate stenotic transmittal flow. Normal bioprosthetic aortic valve function. Trivial tricuspid regurg with RV-RA gradient 31 mm mercury, mild right atrial enlargement.  Followup labs refilled acetic 137 potassium 5.1 chloride 101 CO2 27 BUN 14 creatinine 0 0.82, glucose 174. Hemoglobin 11.7 hematocrit 37.0 white blood cells 5.5 platelets 210.  He comes today feeling bad about his weight gain, diabetes, lack of energy, and worsening shortness of breath. He believes his overall status is related to his obesity, and diabetes. He is compliant with CPAP.      No Known Allergies  Current Outpatient Prescriptions  Medication Sig Dispense Refill  . acetaminophen (TYLENOL) 500 MG tablet Take 500 mg by mouth every 6 (six) hours as needed.      Marland Kitchen aspirin 325 MG tablet Take 325 mg by mouth daily.        . clonazePAM (KLONOPIN) 0.5 MG tablet Take  0.5 mg by mouth daily as needed.       . DULoxetine (CYMBALTA) 60 MG capsule Take 60 mg by mouth daily.        Marland Kitchen FLUoxetine (PROZAC) 20 MG capsule       . furosemide (LASIX) 40 MG tablet Take 40 mg by mouth Daily.       Marland Kitchen HYDROcodone-acetaminophen (VICODIN) 5-500 MG per tablet Take 1 tablet by mouth every 6 (six) hours as needed.      Marland Kitchen levothyroxine (SYNTHROID, LEVOTHROID) 200 MCG tablet Take 400 mcg by mouth daily before breakfast.       . lisinopril (PRINIVIL,ZESTRIL) 10 MG tablet Take 1 tablet (10 mg total) by mouth 2 (two) times daily. take 1 tablet by mouth once daily  60 tablet  6  . metFORMIN (GLUCOPHAGE) 500 MG tablet Take 500 mg by mouth every evening.       . metoprolol tartrate (LOPRESSOR) 25 MG tablet take 1 tablet by mouth daily      . NON FORMULARY CPAP       . omeprazole (PRILOSEC) 20 MG capsule Take 20 mg by mouth at bedtime.       . Rivaroxaban (XARELTO) 20 MG TABS Take 20 mg by mouth daily.  30 tablet  11  . simvastatin (ZOCOR) 40 MG tablet Take 1 tablet (40 mg total) by mouth at bedtime.  90 tablet  3  . [DISCONTINUED] ONGLYZA 5 MG TABS tablet Take 5 mg by mouth daily.        No current facility-administered medications for this visit.    Past Medical History  Diagnosis Date  . Obesity   . Hypertension   . OSA (obstructive sleep apnea)     CPAP  . Depression   . 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 Dyspnea   . Diabetes mellitus   . Hyperlipidemia   . Coronary artery disease (CAD) excluded 2008, 2011    Mild coronary plaque.  w/ normal LV function. repeated cardiac cath on 07/2009  . 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 History of endocarditis     status post Enterococcus faecalis, secondary to chronic injections with low molecular weight heparin  . Deep venous thrombosis     recurrent DVT and PE, status post  IVC filter    Past Surgical History  Procedure Laterality Date  . Thyroidectomy    . Cardiac catheterization  2008  . Cardiac catheterization  07/2009    minor irregulreties without obstructive disease  . Mitral valve repair  04/10/2011    autologous pericardial patch augmentation of posterior leaflet with 28mm Sorin Memo 3D ring annuloplasty  . Aortic valve replacement  04/10/2011    25mm [facility] Magna Ease pericardial bioprosthetic aortic valve    ROS: Review of systems complete and found to be negative unless listed above  PHYSICAL EXAM BP 167/90  Pulse 71  Ht 5\' 11"  (1.803 m)  Wt 337 lb (152.862 kg)  BMI 47.02 kg/m2  General: Well developed, well nourished, in no acute distress, morbidly obese. Head: Eyes PERRLA, No xanthomas.   Normal cephalic and atramatic  Lungs: Clear bilaterally to auscultation and percussion. Heart: HRRR S1 S2, distant heart sounds,without MRG.  Pulses are 2+ & equal.            No carotid bruit. No JVD.  No abdominal bruits. No femoral bruits. Abdomen: Bowel sounds are positive, abdomen distended, and non-tender without masses or                  Hernia's noted. Msk:  Back normal, normal gait. Normal strength and tone for age. Extremities: No clubbing, cyanosis or edema.  DP +1 Neuro: Alert and oriented X 3. Psych:  Flat affect, responds appropriately  :  ASSESSMENT AND PLAN

## 2013-09-03 NOTE — Assessment & Plan Note (Signed)
I believe this is related to his morbid obesity and deconditioning. I referring him over to cardiac rehabilitation to assist with this, he verbalizes agreement in wanting to participate. And is committed to do so.

## 2013-09-03 NOTE — Progress Notes (Deleted)
Name: [name] [name]    DOB: May 30, 1951  Age: 63 y.o.  MR#: [id number]       PCP:  Louie Boston, MD      Insurance: Payor: [employer] MEDICARE / Plan: AARP MEDICARE COMPLETE / Product Type: *No Product type* /   CC:    Chief Complaint  Patient presents with  . Coronary Artery Disease  . Aortic Insuffiency    Status post aortic valve replacement, Edwards magna T. pericardial tissue valve  . Hypertension    VS Filed Vitals:   09/03/13 1524  BP: 167/90  Pulse: 71  Height: 5\' 11"  (1.803 m)  Weight: 337 lb (152.862 kg)    Weights Current Weight  09/03/13 337 lb (152.862 kg)  08/05/13 331 lb 8 oz (150.367 kg)  05/10/13 326 lb (147.873 kg)    Blood Pressure  BP Readings from Last 3 Encounters:  09/03/13 167/90  08/05/13 149/77  05/10/13 153/98     Admit date:  (Not on file) Last encounter with RMR:  08/05/2013   Allergy Review of patient's allergies indicates no known allergies.  Current Outpatient Prescriptions  Medication Sig Dispense Refill  . acetaminophen (TYLENOL) 500 MG tablet Take 500 mg by mouth every 6 (six) hours as needed.      Marland Kitchen aspirin 325 MG tablet Take 325 mg by mouth daily.        . clonazePAM (KLONOPIN) 0.5 MG tablet Take 0.5 mg by mouth daily as needed.       . DULoxetine (CYMBALTA) 60 MG capsule Take 60 mg by mouth daily.        Marland Kitchen FLUoxetine (PROZAC) 20 MG capsule       . furosemide (LASIX) 40 MG tablet Take 40 mg by mouth Daily.       Marland Kitchen HYDROcodone-acetaminophen (VICODIN) 5-500 MG per tablet Take 1 tablet by mouth every 6 (six) hours as needed.      Marland Kitchen levothyroxine (SYNTHROID, LEVOTHROID) 200 MCG tablet Take 400 mcg by mouth daily before breakfast.       . lisinopril (PRINIVIL,ZESTRIL) 10 MG tablet take 1 tablet by mouth once daily  30 tablet  6  . metFORMIN (GLUCOPHAGE) 500 MG tablet Take 500 mg by mouth every evening.       . metoprolol tartrate (LOPRESSOR) 25 MG tablet take 1 tablet by mouth daily      . NON FORMULARY CPAP       .  omeprazole (PRILOSEC) 20 MG capsule Take 20 mg by mouth at bedtime.       . Rivaroxaban (XARELTO) 20 MG TABS Take 20 mg by mouth daily.  30 tablet  11  . simvastatin (ZOCOR) 40 MG tablet Take 1 tablet (40 mg total) by mouth at bedtime.  90 tablet  3  . [DISCONTINUED] ONGLYZA 5 MG TABS tablet Take 5 mg by mouth daily.        No current facility-administered medications for this visit.    Discontinued Meds:    Medications Discontinued During This Encounter  Medication Reason  . INVOKANA 100 MG TABS Error    Patient Active Problem List   Diagnosis Date Noted  . Diabetes 11/02/2012  . Varicose vein 01/24/2012  . RLS (restless legs syndrome) 07/11/2011  . Seroma 07/07/2011  . Deep venous thrombosis   . History of endocarditis   . Coronary artery disease (CAD) excluded   . DVT (deep vein thrombosis) in pregnancy   . Extrasystole 04/30/2011  . S/P AVR (aortic valve replacement)  04/10/2011  . S/P mitral valve repair 04/10/2011  . Mitral regurgitation 03/29/2011  . Aortic insufficiency   . Dyspnea 11/15/2010  . ENDOCARDITIS 03/20/2010  . SHORTNESS OF BREATH 07/21/2009  . OVERWEIGHT/OBESITY 02/20/2009  . DEPRESSION 02/20/2009  . SLEEP APNEA, OBSTRUCTIVE 02/20/2009  . HYPERTENSION, UNSPECIFIED 02/20/2009  . SKIN LESION 02/20/2009  . CHEST PAIN-UNSPECIFIED 02/20/2009  . TRAUMATIC COMPARTMENT SYNDROME LOWER EXTREMITY 02/20/2009    LABS    Component Value Date/Time   NA 137 08/13/2013 1023   NA 137 01/24/2012 1150   NA 136 04/13/2011 0615   K 5.1 08/13/2013 1023   K 5.2 01/24/2012 1150   K 3.7 04/13/2011 0615   CL 101 08/13/2013 1023   CL 102 01/24/2012 1150   CL 99 04/13/2011 0615   CO2 27 08/13/2013 1023   CO2 26 01/24/2012 1150   CO2 26 04/13/2011 0615   GLUCOSE 134* 08/13/2013 1023   GLUCOSE 112* 01/24/2012 1150   GLUCOSE 88 04/13/2011 0615   BUN 14 08/13/2013 1023   BUN 14 01/24/2012 1150   BUN 28* 04/13/2011 0615   CREATININE 0.82 08/13/2013 1023   CREATININE 1.11 01/24/2012 1150    CREATININE 1.18 04/13/2011 0615   CREATININE 1.20 04/12/2011 0504   CREATININE 1.20 04/11/2011 1726   CALCIUM 8.4 08/13/2013 1023   CALCIUM 9.1 01/24/2012 1150   CALCIUM 8.5 04/13/2011 0615   GFRNONAA 65* 04/13/2011 0615   GFRNONAA 64* 04/12/2011 0504   GFRNONAA 74* 04/11/2011 1715   GFRAA 75* 04/13/2011 0615   GFRAA 74* 04/12/2011 0504   GFRAA 86* 04/11/2011 1715   CMP     Component Value Date/Time   NA 137 08/13/2013 1023   K 5.1 08/13/2013 1023   CL 101 08/13/2013 1023   CO2 27 08/13/2013 1023   GLUCOSE 134* 08/13/2013 1023   BUN 14 08/13/2013 1023   CREATININE 0.82 08/13/2013 1023   CREATININE 1.18 04/13/2011 0615   CALCIUM 8.4 08/13/2013 1023   PROT 6.6 08/13/2013 1023   ALBUMIN 3.9 08/13/2013 1023   AST 21 08/13/2013 1023   ALT 25 08/13/2013 1023   ALKPHOS 78 08/13/2013 1023   BILITOT 0.4 08/13/2013 1023   GFRNONAA 65* 04/13/2011 0615   GFRAA 75* 04/13/2011 0615       Component Value Date/Time   WBC 5.5 08/13/2013 1023   WBC 6.2 01/24/2012 1150   WBC 11.6* 04/13/2011 0615   HGB 11.7* 08/13/2013 1023   HGB 12.3* 01/24/2012 1150   HGB 8.7* 04/13/2011 0615   HCT 37.0* 08/13/2013 1023   HCT 37.1* 01/24/2012 1150   HCT 28.6* 04/13/2011 0615   MCV 73.6* 08/13/2013 1023   MCV 77.9* 01/24/2012 1150   MCV 78.6 04/13/2011 0615    Lipid Panel     Component Value Date/Time   CHOL 131 08/13/2013 1023   TRIG 122 08/13/2013 1023   HDL 48 08/13/2013 1023   CHOLHDL 2.7 08/13/2013 1023   VLDL 24 08/13/2013 1023   LDLCALC 59 08/13/2013 1023    ABG    Component Value Date/Time   PHART 7.375 04/10/2011 2018   PCO2ART 38.9 04/10/2011 2018   PO2ART 69.0* 04/10/2011 2018   HCO3 22.9 04/10/2011 2018   TCO2 23 04/11/2011 1726   ACIDBASEDEF 2.0 04/10/2011 2018   O2SAT 93.0 04/10/2011 2018     Lab Results  Component Value Date   TSH 1.630 ***Test methodology is 3rd generation TSH**** 02/21/2009   BNP (last 3 results) No results found for this basename: PROBNP,  in the last 8760 hours Cardiac  Panel (last 3 results) No results found for this basename: CKTOTAL, CKMB, TROPONINI, RELINDX,  in the last 72 hours  Iron/TIBC/Ferritin No results found for this basename: iron, tibc, ferritin     EKG Orders placed in visit on 11/02/12  . EKG 12-LEAD     Prior Assessment and Plan Problem List as of 09/03/2013     Cardiovascular and Mediastinum   HYPERTENSION, UNSPECIFIED   Last Assessment & Plan   08/05/2013 Office Visit Written 08/05/2013  3:19 PM by Jodelle GrossKathryn M Lawrence, NP     He brings with him a list of BP at home ranging from 158/92 to 148/96. Have done orthostatic BP here in the office.  He is negative, BP lyig 130/84, Sitting 130/89; Standing 131/85.  I will check a CBC and BMET. Echocardiogram for LV fx and evaluation of prosthetic valves.    ENDOCARDITIS   Last Assessment & Plan   04/30/2011 Office Visit Written 04/30/2011 10:50 AM by June LeapGuy E de Gent, MD     No recurrent fevers or chills.    Aortic insufficiency   Last Assessment & Plan   05/10/2013 Office Visit Written 05/10/2013 10:24 AM by Wendall StadePeter C Nishan, MD     S/P tissue AVR with MV repair SBE prophylaxis See Cherlyn CushingBill Brown for oral care  Echo last year fine with no change in murmur Consider echo in a year    Mitral regurgitation   Last Assessment & Plan   04/30/2011 Office Visit Written 04/30/2011 10:51 AM by June LeapGuy E de Gent, MD     Limited bedside echocardiogram shows no mitral regurgitation. No pathological murmur on exam. No pericardial effusion is noted.    Extrasystole   Last Assessment & Plan   04/30/2011 Office Visit Written 04/30/2011 10:52 AM by June LeapGuy E de Gent, MD     Extrasystoles heart on physical examination. Will obtain electrocardiogram to rule out atrial fibrillation.    S/P AVR (aortic valve replacement)   Last Assessment & Plan   08/05/2013 Office Visit Written 08/05/2013  3:20 PM by Jodelle GrossKathryn M Lawrence, NP     He will have an echo as discussed. He will follow up in [place] with Dr. Eden EmmsNishan at his request.    Deep  venous thrombosis   DVT (deep vein thrombosis) in pregnancy   Last Assessment & Plan   11/02/2012 Office Visit Written 11/02/2012  2:19 PM by Wendall StadePeter C Nishan, MD     Multiple on chronic xarelto.  Cr normal Discussed need for CT/imaging for any major trauma.      Varicose vein   Last Assessment & Plan   05/10/2013 Office Visit Written 05/10/2013 10:22 AM by Wendall StadePeter C Nishan, MD     Encouraged him to see vein specialist to see if they can be sclerosed.  Support hose as needed      Respiratory   SLEEP APNEA, OBSTRUCTIVE   Last Assessment & Plan   07/11/2011 Office Visit Written 07/11/2011  1:01 PM by Barbaraann ShareKeith M Clance, MD     The patient has a history of mild obstructive sleep apnea, but it appears to be well controlled at this time with his current CPAP settings.  I do wonder if he may be having a pressure leak through the mouth opening, but will address his leg movements first since this seems to be his overwhelming sleep issue currently.  I have encouraged him to work aggressively on weight loss.  Endocrine   Diabetes   Last Assessment & Plan   05/10/2013 Office Visit Written 05/10/2013 10:24 AM by Wendall Stade, MD     Discussed low carb diet and weight loss.  Marja Kays has helped  A1c's have been way too high F/U primary      Musculoskeletal and Integument   SKIN LESION   TRAUMATIC COMPARTMENT SYNDROME LOWER EXTREMITY     Other   OVERWEIGHT/OBESITY   Last Assessment & Plan   01/24/2012 Office Visit Written 01/24/2012 11:47 AM by Wendall Stade, MD     Discussed low carb diet and exercise program.      DEPRESSION   SHORTNESS OF BREATH   Last Assessment & Plan   08/05/2013 Office Visit Written 08/05/2013  3:21 PM by Jodelle Gross, NP     I think this is related to deconditioning and weight. I will check a CBC to evaluate for bleeding on xarelto.    CHEST PAIN-UNSPECIFIED   Last Assessment & Plan   11/23/2010 Office Visit Written 11/23/2010  5:19 PM by Iran Ouch, MD     The  patient had recent episodes of chest tightness associated with dyspnea. It is mainly exertional at its likely due to congestive heart failure and not coronary artery disease. His most recent cardiac catheterization was in February of 2011 and showed no significant coronary artery disease. Thus, I will not repeat ischemic evaluation at this time.    Dyspnea   Last Assessment & Plan   01/24/2012 Office Visit Edited 01/24/2012 11:52 AM by Wendall Stade, MD     Functional.  Will check echo to reassess PA pressure and valves. 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  He has been tried on Mirapex in the past, but it appears that his dose was never titrated upward.  He has also been tried on horizant.  He currently is on Klonopin as needed, however studies have shown this is not an effective treatment for RLS.  At this point, I would like to try him on a different dopamine agonist, and see if we can push the dose higher.  The patient has iron deficiency anemia, and this is one of the major causes of RLS.  The patient is also on an antidepressant medication, and these can sometimes augment symptoms.  If the patient fails to improve with standard therapy, I would begin to consider whether this is truly periodic limb movements.  Other considerations would include myoclonic jerking which may have a neurologic origin.  There is really nothing to suggest a REM behavior disorder at this time.        Imaging: No results found.

## 2013-09-03 NOTE — Patient Instructions (Signed)
Your physician recommends that you schedule a follow-up appointment in: 1 month with Zonia KiefKathryn Lawrence,NP  Your physician has recommended you make the following change in your medication:  Take Lisinopril 10 mg twice a day  Your physician recommends that you return for lab work today. CBC  You have been referred to Cardiac Rehab

## 2013-09-09 ENCOUNTER — Telehealth: Payer: Self-pay | Admitting: *Deleted

## 2013-09-09 NOTE — Telephone Encounter (Signed)
Received Laural [employer] and [employer] patient assistance fax, they have not received patients application for xarelto assistance, I faxed in the scripted1/05/2014. I left patient a message regarding this.

## 2013-09-26 ENCOUNTER — Other Ambulatory Visit: Payer: Self-pay | Admitting: Cardiology

## 2013-09-27 ENCOUNTER — Other Ambulatory Visit: Payer: Self-pay

## 2013-09-27 ENCOUNTER — Telehealth: Payer: Self-pay | Admitting: Adult Health

## 2013-09-27 NOTE — Telephone Encounter (Signed)
Received approved authorization for Xarelto 20 mg daily for the period of one year 09/27/13-09/28/14 by Randa EvensJacklyn at [facility] rx confirmation number [id number]   rx placed to pts pharmacy  Prior auth request also sent to scan

## 2013-09-27 NOTE — Telephone Encounter (Signed)
Prior authorization in refill bin / tgs

## 2013-09-27 NOTE — Telephone Encounter (Signed)
Pt called about it also/tmj

## 2013-10-04 ENCOUNTER — Ambulatory Visit: Payer: Medicare Other | Admitting: Adult Health

## 2013-10-12 ENCOUNTER — Telehealth: Payer: Self-pay | Admitting: *Deleted

## 2013-10-12 DIAGNOSIS — R079 Chest pain, unspecified: Secondary | ICD-10-CM

## 2013-10-12 DIAGNOSIS — I351 Nonrheumatic aortic (valve) insufficiency: Secondary | ICD-10-CM

## 2013-10-12 MED ORDER — SIMVASTATIN 40 MG PO TABS: 40.0000 mg | ORAL_TABLET | Freq: Every day | ORAL | Status: DC

## 2013-10-12 NOTE — Telephone Encounter (Signed)
SIMVASTATIN 40 MG #90 X3

## 2013-10-12 NOTE — Telephone Encounter (Signed)
Medication sent via escribe.  

## 2014-01-18 ENCOUNTER — Other Ambulatory Visit: Payer: Self-pay | Admitting: Cardiovascular Disease

## 2014-01-18 MED ORDER — METOPROLOL TARTRATE 25 MG PO TABS: 25.0000 mg | ORAL_TABLET | Freq: Every day | ORAL | Status: DC

## 2014-02-21 ENCOUNTER — Telehealth: Payer: Medicare Other | Admitting: Adult Health

## 2014-02-24 ENCOUNTER — Telehealth: Payer: Self-pay | Admitting: *Deleted

## 2014-02-24 NOTE — Telephone Encounter (Signed)
I am unable to locate from 2011 the name or placement date of pt filter.We have spoken and I am still investigating

## 2014-02-24 NOTE — Telephone Encounter (Signed)
PT IS CALLING ABOUT MESSAGE HE SENT THROUGH MYCHART ON MONDAY

## 2014-03-02 NOTE — Telephone Encounter (Signed)
Hi [name]  I have scoured your electronic chart and I am unable to find the information you request.I spoke with Medical records at [facility] and ahe is going to research this.it may take a couple weeks.I will keep you posted     Cathey

## 2014-03-22 NOTE — Telephone Encounter (Signed)
[facility] [facility] [facility] to [facility] need,I called cath lab and they have no documentation.I have now called Interventional radiology ans am awaiting to hear from them.I LM on pt's home line with this info

## 2014-03-22 NOTE — Telephone Encounter (Signed)
Message copied by Nori RiisARLTON, Jochebed Bills A on Tue Mar 22, 2014 10:50 AM ------      Message from: Marlyn CorporalARLTON, Cyana Shook A      Created: Wed Mar 02, 2014 12:50 PM      Regarding: [facility] at [facility]       [facility] at [facility] ------

## 2014-03-22 NOTE — Telephone Encounter (Signed)
I [facility] with [facility] and she is still trying to locate information for patient.she has my fax and call back number

## 2014-03-25 ENCOUNTER — Telehealth: Payer: Self-pay

## 2014-03-25 NOTE — Telephone Encounter (Signed)
IVC filter placed 02/22/2009 [facility]  LM for pt to call back

## 2014-06-19 NOTE — Progress Notes (Signed)
Patient ID: [name] [name], male   DOB: 06/09/1951, 63 y.o.   MRN: [id number] [name] [name] is a 63 year old patient who last saw PA in [place] 5/15   we are following for ongoing assessment and management of CAD, severe aortic insufficiency, status post aortic valve replacement using a 25 mm Edwards magna ease pericardial tissue valve, and mitral valve repair with complex valvuloplasty including autolgous or cardio patch repair of the posterior leaflet using a 28 mm Memo- 3-D ring annuloplasty. This was completed on October of 2012.  He was last seen in the office by me  on 05/10/2013 and was stable from a cardiac standpoint. At some mild complaints of fatigue which was gradually improving. He was advised on increasing his exercise, losing weight, and appearing to heart healthy diet.  The patient was continued on Xarelto, with a history of multiple DVTs and IVC filter placed. He also has a history of varicosities of the lower extremity and was advised to see a vein specialists for evaluation and treatment.  3/15 complained  of increase BP, headaches, some dizziness, and epistaxis. He is concerned because he is feeling more tired than usual. He has reently started on Onglyza per PCP for diabetes control. Symptoms began shortly after taking this medication. He read some of the side effects and is concerned that those listed are occuring with him. Specifically dizziness and fatigue.  He denies chest pain, palpitations. He admits that his weight is increasing.   F/U echo done 3/15  Reviewed  Impressions:  - Mild LVH with LVEF 60-65%, grade 2 diastolic dysfunction. Severe left atrial enlargement. Mitral annuloplasty with thickened leaflets, no mitral regurgitation. Somewhat discordant area information - cannot exclude mild to moderate stenotic transmitral flow. Normal bioprosthetic aorticvalve function. Trivial triscupid regurgitation with RV-RA gradient 31 mmHg. Mild right atrial  enlargement.  Lots of pain in legs from varicose veins Functional dyspnea      ROS: Denies fever, malais, weight loss, blurry vision, decreased visual acuity, cough, sputum, SOB, hemoptysis, pleuritic pain, palpitaitons, heartburn, abdominal pain, melena, lower extremity edema, claudication, or rash.  All other systems reviewed and negative  General: Affect appropriate Obese white male  HEENT: normal Neck supple with no adenopathy JVP normal no bruits no thyromegaly Lungs clear with no wheezing and good diaphragmatic motion Heart:  S1/S2 SEM no AR  murmur, no rub, gallop or click PMI normal Abdomen: benighn, BS positve, no tenderness, no AAA no bruit.  No HSM or HJR Distal pulses intact with no bruits Plus one edema with marked bilateral varicose veins especially LU thigh  Neuro non-focal Skin warm and dry No muscular weakness   Current Outpatient Prescriptions  Medication Sig Dispense Refill  . acetaminophen (TYLENOL) 500 MG tablet Take 500 mg by mouth every 6 (six) hours as needed.    Marland Kitchen. aspirin 325 MG tablet Take 325 mg by mouth daily.      . clonazePAM (KLONOPIN) 0.5 MG tablet Take 0.5 mg by mouth daily as needed.     . DULoxetine (CYMBALTA) 60 MG capsule Take 60 mg by mouth daily.      Marland Kitchen. FLUoxetine (PROZAC) 20 MG capsule     . furosemide (LASIX) 40 MG tablet Take 40 mg by mouth Daily.     Marland Kitchen. HYDROcodone-acetaminophen (VICODIN) 5-500 MG per tablet Take 1 tablet by mouth every 6 (six) hours as needed.    Marland Kitchen. levothyroxine (SYNTHROID, LEVOTHROID) 200 MCG tablet Take 400 mcg by mouth daily before breakfast.     .  lisinopril (PRINIVIL,ZESTRIL) 10 MG tablet Take 1 tablet (10 mg total) by mouth 2 (two) times daily. take 1 tablet by mouth once daily 60 tablet 6  . metFORMIN (GLUCOPHAGE) 500 MG tablet Take 500 mg by mouth every evening.     . metoprolol tartrate (LOPRESSOR) 25 MG tablet Take 1 tablet (25 mg total) by mouth daily. 90 tablet 3  . NON FORMULARY CPAP     . omeprazole  (PRILOSEC) 20 MG capsule Take 20 mg by mouth at bedtime.     . simvastatin (ZOCOR) 40 MG tablet Take 1 tablet (40 mg total) by mouth at bedtime. 90 tablet 3  . XARELTO 20 MG TABS tablet take 1 tablet by mouth once daily 30 tablet 11  . [DISCONTINUED] ONGLYZA 5 MG TABS tablet Take 5 mg by mouth daily.      No current facility-administered medications for this visit.    Allergies  Review of patient's allergies indicates no known allergies.  Electrocardiogram:  5/14  SR rate 80  Long QT  Today SR rate 75  Nonspecific inferior ST changes   Assessment and Plan

## 2014-06-21 ENCOUNTER — Encounter: Payer: Self-pay | Admitting: Cardiovascular Disease

## 2014-06-21 ENCOUNTER — Ambulatory Visit (INDEPENDENT_AMBULATORY_CARE_PROVIDER_SITE_OTHER): Payer: Medicare Other | Admitting: Cardiovascular Disease

## 2014-06-21 VITALS — BP 136/70 | HR 75 | Ht 71.0 in | Wt 323.0 lb

## 2014-06-21 DIAGNOSIS — I868 Varicose veins of other specified sites: Secondary | ICD-10-CM

## 2014-06-21 DIAGNOSIS — Z9889 Other specified postprocedural states: Secondary | ICD-10-CM

## 2014-06-21 DIAGNOSIS — I8390 Asymptomatic varicose veins of unspecified lower extremity: Secondary | ICD-10-CM

## 2014-06-21 DIAGNOSIS — I10 Essential (primary) hypertension: Secondary | ICD-10-CM

## 2014-06-21 DIAGNOSIS — E1021 Type 1 diabetes mellitus with diabetic nephropathy: Secondary | ICD-10-CM

## 2014-06-21 NOTE — Patient Instructions (Addendum)
Your physician wants you to follow-up in:   6  MONTHS WITH  DR Haywood FillerNISHAN  You will receive a reminder letter in the mail two months in advance. If you don't receive a letter, please call our office to schedule the follow-up appointment. Your physician recommends that you continue on your current medications as directed. Please refer to the Current Medication list given to you today.   DR Coralie CarpenBRENT GREENBERG  [phone] [place], [place]

## 2014-06-21 NOTE — Assessment & Plan Note (Signed)
Major issue combined with obesity leading to pain and edema Refer to dr Jimmie MollyGreenberg for scleroRx Continue diuretics

## 2014-06-21 NOTE — Assessment & Plan Note (Signed)
Well controlled.  Continue current medications and low sodium Dash type diet.    

## 2014-06-21 NOTE — Assessment & Plan Note (Signed)
Discussed low carb diet.  Target hemoglobin A1c is 6.5 or less.  Continue current medications. Having some side effects from meds with rash and weight gain now on invocana and a bit better

## 2014-06-21 NOTE — Assessment & Plan Note (Signed)
AVR with MV repair echo ok  SBE prophylaxis  Stable

## 2014-07-04 ENCOUNTER — Telehealth: Payer: Self-pay | Admitting: Pulmonary Disease

## 2014-07-04 DIAGNOSIS — G4733 Obstructive sleep apnea (adult) (pediatric): Secondary | ICD-10-CM

## 2014-07-04 NOTE — Telephone Encounter (Signed)
Pt is aware of below and order placed. Nothing further needed

## 2014-07-04 NOTE — Telephone Encounter (Signed)
Ok with me 

## 2014-07-04 NOTE — Telephone Encounter (Signed)
Pt has not been seen 07/11/11 by [facility]. He is scheduled for appt 07/20/14. He is needing RX for new CPAP mask. He is not sure who he uses for DME. He forgot to follow up bc he had complications after his heart surgery. Please advise if okay to send in RX? thanks

## 2014-07-20 ENCOUNTER — Ambulatory Visit: Payer: Self-pay | Admitting: Pulmonary Disease

## 2014-07-26 ENCOUNTER — Encounter: Payer: Self-pay | Admitting: Pulmonary Disease

## 2014-07-27 ENCOUNTER — Ambulatory Visit: Payer: Self-pay | Admitting: Pulmonary Disease

## 2014-08-05 ENCOUNTER — Encounter: Payer: Self-pay | Admitting: Pulmonary Disease

## 2014-08-09 ENCOUNTER — Encounter: Payer: Self-pay | Admitting: Pulmonary Disease

## 2014-08-09 ENCOUNTER — Ambulatory Visit (INDEPENDENT_AMBULATORY_CARE_PROVIDER_SITE_OTHER): Payer: Medicare Other | Admitting: Pulmonary Disease

## 2014-08-09 ENCOUNTER — Ambulatory Visit: Payer: Self-pay | Admitting: Pulmonary Disease

## 2014-08-09 VITALS — BP 126/70 | HR 72 | Temp 97.6°F | Ht 71.0 in | Wt 326.8 lb

## 2014-08-09 DIAGNOSIS — G2581 Restless legs syndrome: Secondary | ICD-10-CM

## 2014-08-09 DIAGNOSIS — G4733 Obstructive sleep apnea (adult) (pediatric): Secondary | ICD-10-CM

## 2014-08-09 MED ORDER — PRAMIPEXOLE DIHYDROCHLORIDE 0.25 MG PO TABS: ORAL_TABLET | ORAL | Status: DC

## 2014-08-09 MED ORDER — PRAMIPEXOLE DIHYDROCHLORIDE 0.125 MG PO TABS: ORAL_TABLET | ORAL | Status: DC

## 2014-08-09 NOTE — Assessment & Plan Note (Signed)
The patient is wearing C Pap compliantly by his history, and he feels overall that he is doing well with his device. Unfortunately, his download card is not working properly, and he will need to go by his home care company to get a new one. I have encouraged him to keep up with his mask changes and supplies, and to work aggressively on weight loss.

## 2014-08-09 NOTE — Progress Notes (Signed)
   Subjective:    Patient ID: [name] [name], male    DOB: 02/26/1951, 64 y.o.   MRN: [id number]  HPI The patient comes in today for follow-up of his obstructive sleep apnea. He has not been seen in 2 years, but is wearing his device compliantly. He has no issues with mask leak or fit, and feels the pressure is adequate. Unfortunately, his download card is not working, so we cannot obtain data. He has gained 18 pounds since the last visit. He also is complaining of worsening RLS symptoms. We tried him on low-dose dopamine agonist at the last visit, and he now tells me that it did not help him. His wife is describing classic RLS movements during the night, although he does have a history of a severe peripheral neuropathy.   Review of Systems  Constitutional: Negative for fever and unexpected weight change.  HENT: Negative for congestion, dental problem, ear pain, nosebleeds, postnasal drip, rhinorrhea, sinus pressure, sneezing, sore throat and trouble swallowing.   Eyes: Negative for redness and itching.  Respiratory: Negative for cough, chest tightness, shortness of breath and wheezing.   Cardiovascular: Negative for palpitations and leg swelling.  Gastrointestinal: Negative for nausea and vomiting.  Genitourinary: Negative for dysuria.  Musculoskeletal: Negative for joint swelling.  Skin: Negative for rash.  Neurological: Negative for headaches.  Hematological: Does not bruise/bleed easily.  Psychiatric/Behavioral: Negative for dysphoric mood. The patient is not nervous/anxious.        Objective:   Physical Exam Morbidly obese male in no acute distress Nose without purulence or discharge noted Neck without lymphadenopathy or thyromegaly No skin breakdown or pressure necrosis from the C Pap mask Lower extremities with 1+ edema, no cyanosis Alert and oriented, does not appear to be sleepy, moves all 4 extremities.       Assessment & Plan:

## 2014-08-09 NOTE — Assessment & Plan Note (Signed)
The patient has a history of the restless leg syndrome, and did not see significant improvement with a trial of low dose dopamine agonist. I have explained that we need to increase the dose for symptoms, and may need to try different medications. He also has a history of neuropathy, but his family member is describing classic RLS movements during sleep.

## 2014-08-09 NOTE — Patient Instructions (Signed)
Will refer you to a new home care company per your request.  Have them get you a new card for your cpap machine, and get a download after a few weeks to make sure is working.  Have them send that to me as well. Work on [employer] Will start on mirapex 0.25mg  one after supper each night.  Call me in 2 weeks with your response to treatment. followup with me again in 6mos if doing well.

## 2014-08-25 ENCOUNTER — Encounter: Payer: Self-pay | Admitting: Pulmonary Disease

## 2014-08-30 NOTE — Telephone Encounter (Signed)
Let him know that I would continue on mirapex, and await his download from his cpap device.  If he doesn't hear from us 2 weeks after the download is done let us know.

## 2014-09-05 ENCOUNTER — Other Ambulatory Visit: Payer: Self-pay | Admitting: Cardiovascular Disease

## 2014-09-05 MED ORDER — LISINOPRIL 10 MG PO TABS: 10.0000 mg | ORAL_TABLET | Freq: Two times a day (BID) | ORAL | Status: DC

## 2014-09-05 NOTE — Telephone Encounter (Signed)
Received fax refill request  Rx # [id number] Medication:  Lisinopril 10 mg tablet Qty 60 Sig:  Take one tablet by mouth twice a dat Physician:  Purvis SheffieldKoneswaran

## 2014-09-05 NOTE — Telephone Encounter (Signed)
escribed refill as rx as written not as what pt reports he's taking (pt taking 1/2 dose ordered)

## 2014-09-15 ENCOUNTER — Telehealth: Payer: Self-pay | Admitting: *Deleted

## 2014-09-15 NOTE — Telephone Encounter (Signed)
UNABLE  TO LEAVE MESSAGE PT  IS  DUE FOR  June  APPT  WILL TRY  AT LATER  DATE .Zack Seal/[facility]

## 2014-09-15 NOTE — Telephone Encounter (Signed)
Follow Up         Pt calling returning Christine's phone call.

## 2014-09-15 NOTE — Telephone Encounter (Signed)
SPOKE  WITH  PT APPT  MADE  FOR  11-23-14 AT  2:30  PM

## 2014-09-27 ENCOUNTER — Encounter: Payer: Self-pay | Admitting: Pulmonary Disease

## 2014-09-28 NOTE — Telephone Encounter (Signed)
[facility] please advise. Thanks. 

## 2014-09-28 NOTE — Telephone Encounter (Signed)
Would like him to try increasing mirapex to 2 after dinner each night to see if improves.  Give us some feedback in 2 weeks.

## 2014-10-04 ENCOUNTER — Other Ambulatory Visit: Payer: Self-pay | Admitting: Pulmonary Disease

## 2014-10-11 ENCOUNTER — Encounter: Payer: Self-pay | Admitting: Pulmonary Disease

## 2014-10-11 NOTE — Telephone Encounter (Signed)
Dr. Shelle Ironlance, I am following up reference results of the increased dosage of Mirapex. There has been significant improvement however; during the day when I'm sitting the "jerking" commences. I sit after walking outside. Its presents as seizures in my legs. Any recommendation is appreciated. I thank you for your time and interest.   [name] [name]- the above is what pt sent to us through email  Please advise recs thanks

## 2014-10-11 NOTE — Telephone Encounter (Signed)
Let pt know that if medication for RLS is increased too much, can cause "augmentation".  This is where the leg symptoms during the day can actually worsen.  I do not know if this is the case with him, or whether his leg issues are related to his neuropathy and not his RLS> Ask him to decrease his mirapex 0.25 to 1 and a 1/2 in the evening rather than 2 and give it about 10-14 days to see if daytime issues improve. Let us know.

## 2014-10-11 NOTE — Telephone Encounter (Signed)
Message sent to patient.  Nothing further needed.

## 2014-10-24 ENCOUNTER — Other Ambulatory Visit: Payer: Self-pay

## 2014-10-24 DIAGNOSIS — I351 Nonrheumatic aortic (valve) insufficiency: Secondary | ICD-10-CM

## 2014-10-24 DIAGNOSIS — E785 Hyperlipidemia, unspecified: Secondary | ICD-10-CM

## 2014-10-24 MED ORDER — SIMVASTATIN 40 MG PO TABS: 40.0000 mg | ORAL_TABLET | Freq: Every day | ORAL | Status: DC

## 2014-11-04 ENCOUNTER — Telehealth: Payer: Self-pay | Admitting: Pulmonary Disease

## 2014-11-04 ENCOUNTER — Other Ambulatory Visit: Payer: Self-pay | Admitting: Pulmonary Disease

## 2014-11-04 ENCOUNTER — Telehealth: Payer: Self-pay | Admitting: Cardiovascular Disease

## 2014-11-04 MED ORDER — PRAMIPEXOLE DIHYDROCHLORIDE 0.25 MG PO TABS: ORAL_TABLET | ORAL | Status: DC

## 2014-11-04 NOTE — Telephone Encounter (Signed)
Pt aware that Mirapex has been refilled. Nothing further needed.

## 2014-11-04 NOTE — Telephone Encounter (Signed)
Pt states that the Mirapex has been doing great in controlling his RLS Pt reports that the issues he had prior all went back to his diabetes Pt would like a refill of the Mirapex .25mg  - has only 1 dose left.  Please advise Dr Shelle Ironlance. Thanks.

## 2014-11-04 NOTE — Telephone Encounter (Signed)
QUESTIONS  ALL ANSWERED .[name] [name]/[facility]

## 2014-11-04 NOTE — Telephone Encounter (Signed)
Ok to fill with refills.  

## 2014-11-04 NOTE — Telephone Encounter (Signed)
New Message  Rn w/ [employer] wanted to speak w/ Rn to clarify D/X and last Echo ejection fraction. Please call back and dsicuss.

## 2014-11-23 ENCOUNTER — Ambulatory Visit: Payer: Medicare Other | Admitting: Cardiovascular Disease

## 2014-12-05 ENCOUNTER — Encounter: Payer: Self-pay | Admitting: Cardiovascular Disease

## 2014-12-05 ENCOUNTER — Other Ambulatory Visit: Payer: Self-pay | Admitting: Adult Health

## 2014-12-06 ENCOUNTER — Other Ambulatory Visit: Payer: Self-pay

## 2014-12-06 MED ORDER — RIVAROXABAN 20 MG PO TABS: 20.0000 mg | ORAL_TABLET | Freq: Every day | ORAL | Status: DC

## 2014-12-15 ENCOUNTER — Ambulatory Visit (HOSPITAL_COMMUNITY)
Admission: RE | Admit: 2014-12-15 | Discharge: 2014-12-15 | Disposition: A | Discharge status: Home / Self Care | Payer: Medicare Other | Source: Ambulatory Visit | Attending: Orthopaedic Surgery | Admitting: Orthopaedic Surgery

## 2014-12-15 ENCOUNTER — Other Ambulatory Visit (HOSPITAL_COMMUNITY): Payer: Self-pay | Admitting: Orthopaedic Surgery

## 2014-12-15 DIAGNOSIS — M25562 Pain in left knee: Secondary | ICD-10-CM | POA: Insufficient documentation

## 2014-12-15 DIAGNOSIS — M25552 Pain in left hip: Secondary | ICD-10-CM

## 2014-12-30 ENCOUNTER — Other Ambulatory Visit: Payer: Self-pay | Admitting: Adult Health

## 2015-01-02 ENCOUNTER — Telehealth: Payer: Self-pay | Admitting: Internal Medicine

## 2015-01-02 MED ORDER — PRAMIPEXOLE DIHYDROCHLORIDE 0.25 MG PO TABS: ORAL_TABLET | ORAL | Status: DC

## 2015-01-02 NOTE — Telephone Encounter (Signed)
Rx has been refilled under [facility]. Pt is aware. Nothing further was needed.

## 2015-01-29 ENCOUNTER — Encounter: Payer: Self-pay | Admitting: Cardiovascular Disease

## 2015-01-30 ENCOUNTER — Encounter (INDEPENDENT_AMBULATORY_CARE_PROVIDER_SITE_OTHER): Payer: Self-pay | Admitting: *Deleted

## 2015-01-30 ENCOUNTER — Other Ambulatory Visit: Payer: Self-pay | Admitting: *Deleted

## 2015-01-30 DIAGNOSIS — E785 Hyperlipidemia, unspecified: Secondary | ICD-10-CM

## 2015-01-30 DIAGNOSIS — I351 Nonrheumatic aortic (valve) insufficiency: Secondary | ICD-10-CM

## 2015-01-30 MED ORDER — SIMVASTATIN 40 MG PO TABS: 40.0000 mg | ORAL_TABLET | Freq: Every day | ORAL | Status: DC

## 2015-02-06 ENCOUNTER — Ambulatory Visit (INDEPENDENT_AMBULATORY_CARE_PROVIDER_SITE_OTHER): Payer: Medicare Other | Admitting: Cardiovascular Disease

## 2015-02-06 ENCOUNTER — Encounter: Payer: Self-pay | Admitting: Cardiovascular Disease

## 2015-02-06 VITALS — BP 144/94 | HR 82 | Ht 71.0 in | Wt 321.8 lb

## 2015-02-06 DIAGNOSIS — M25552 Pain in left hip: Secondary | ICD-10-CM | POA: Diagnosis not present

## 2015-02-06 NOTE — Patient Instructions (Addendum)
Medication Instructions:  NO CHANGES  Labwork: NONE  Testing/Procedures: NONE  Follow-Up: Your physician wants you to follow-up in:   6 MONTHS  WITH  DR Haywood Filler will receive a reminder letter in the mail two months in advance. If you don't receive a letter, please call our office to schedule the follow-up appointment.    You have been referred to DR  [facility]    HIP  SURGERY  Any Other Special Instructions Will Be Listed Below (If Applicable).

## 2015-02-06 NOTE — Progress Notes (Signed)
Patient ID: [name] [name], male   DOB: [birth year], 64 y.o.   MRN: [id number] Mr. Muellner is a 64 y.o.  patient  we are following for ongoing assessment and management of CAD, severe aortic insufficiency, status post aortic valve replacement using a 25 mm Edwards magna ease pericardial tissue valve, and mitral valve repair with complex valvuloplasty including autolgous or cardio patch repair of the posterior leaflet using a 28 mm Memo- 3-D ring annuloplasty. This was completed on October of 2012.  The patient was continued on Xarelto, with a history of multiple DVTs and IVC filter placed. He also has a history of varicosities of the lower extremity and was advised to see a vein specialists for evaluation and treatment.   He denies chest pain, palpitations. He admits that his weight is increasing.   F/U echo done 3/15  Reviewed  Impressions:  - Mild LVH with LVEF 60-65%, grade 2 diastolic dysfunction. Severe left atrial enlargement. Mitral annuloplasty with thickened leaflets, no mitral regurgitation. Somewhat discordant area information - cannot exclude mild to moderate stenotic transmitral flow. Normal bioprosthetic aorticvalve function. Trivial triscupid regurgitation with RV-RA gradient 31 mmHg. Mild right atrial enlargement.    Lab Results  Component Value Date   HGBA1C 7.7* 08/13/2013   Dr Margo Common in [place] following most recent labs Has significant left knee and hip issues.  Was told by Dr Hilda Lias in [place] He is too high risk of hip replacement.    ROS: Denies fever, malais, weight loss, blurry vision, decreased visual acuity, cough, sputum, SOB, hemoptysis, pleuritic pain, palpitaitons, heartburn, abdominal pain, melena, lower extremity edema, claudication, or rash.  All other systems reviewed and negative  General: Affect appropriate Obese white male  HEENT: normal Neck supple with no adenopathy JVP normal no bruits no thyromegaly Lungs clear with no wheezing  and good diaphragmatic motion Heart:  S1/S2 SEM no AR  murmur, no rub, gallop or click PMI normal Abdomen: benighn, BS positve, no tenderness, no AAA no bruit.  No HSM or HJR Distal pulses intact with no bruits Plus one edema with marked bilateral varicose veins especially LU thigh  Neuro non-focal Skin warm and dry No muscular weakness   Current Outpatient Prescriptions  Medication Sig Dispense Refill  . acetaminophen (TYLENOL) 500 MG tablet Take 500 mg by mouth every 6 (six) hours as needed.    Marland Kitchen aspirin 325 MG tablet Take 325 mg by mouth daily.      . diazepam (VALIUM) 5 MG tablet Take 1 tablet by mouth daily.  0  . Exenatide ER (BYDUREON) 2 MG PEN Inject into the skin once a week.    Marland Kitchen FLUoxetine (PROZAC) 20 MG capsule     . furosemide (LASIX) 40 MG tablet Take 40 mg by mouth Daily.     Marland Kitchen HYDROcodone-acetaminophen (VICODIN) 5-500 MG per tablet Take 1 tablet by mouth every 6 (six) hours as needed.    Marland Kitchen levothyroxine (SYNTHROID, LEVOTHROID) 200 MCG tablet Take 200 mcg by mouth daily before breakfast.     . lisinopril (PRINIVIL,ZESTRIL) 10 MG tablet Take 1 tablet (10 mg total) by mouth 2 (two) times daily. take 1 tablet by mouth once daily 60 tablet 6  . metFORMIN (GLUCOPHAGE) 500 MG tablet Take 500 mg by mouth every evening.     . metoprolol tartrate (LOPRESSOR) 25 MG tablet take 1 tablet by mouth once daily 90 tablet 3  . NON FORMULARY CPAP     . omeprazole (PRILOSEC) 20 MG capsule Take 20 mg  by mouth at bedtime.     . pramipexole (MIRAPEX) 0.25 MG tablet Take 2 tablets by mouth after dinner each night 60 tablet 3  . rivaroxaban (XARELTO) 20 MG TABS tablet Take 1 tablet (20 mg total) by mouth daily. 30 tablet 3  . simvastatin (ZOCOR) 40 MG tablet Take 1 tablet (40 mg total) by mouth at bedtime. 90 tablet 0  . [DISCONTINUED] ONGLYZA 5 MG TABS tablet Take 5 mg by mouth daily.      No current facility-administered medications for this visit.    Allergies  Review of patient's  allergies indicates no known allergies.  Electrocardiogram:  5/14  SR rate 80  Long QT   05/2014   SR rate 75  Nonspecific inferior ST changes   Assessment and Plan AVR:  Tissue AVR with no residual AR and normal systolic gradients MV:  Repair  Somewhat "tight"  Continue beta blocker to maximize diastolic filling time.  Post repair EF normal  SBE   DVT:  With IVC filter on chronic anticoagulation with xarelto  Would consider bridging with lovenox prior to ortho surgery Arthritis:  I think he is an acceptable candidate for hip surgery Will refer him to Dr Delynn Flavin for 2nd opinion   F/U with me in 6 months

## 2015-02-07 ENCOUNTER — Ambulatory Visit: Payer: Medicare Other | Admitting: Pulmonary Disease

## 2015-03-02 ENCOUNTER — Encounter: Payer: Self-pay | Admitting: Internal Medicine

## 2015-03-02 DIAGNOSIS — G4733 Obstructive sleep apnea (adult) (pediatric): Secondary | ICD-10-CM

## 2015-03-03 NOTE — Telephone Encounter (Signed)
Ok to order replacement CPAP machine at current setting, with mask of choice, humidifier, supplies, AirView for dx OSA, with the documentation he reuests

## 2015-03-03 NOTE — Addendum Note (Signed)
Addended by: Christen Butter on: 03/03/2015 04:37 PM   Modules accepted: Orders

## 2015-03-03 NOTE — Telephone Encounter (Signed)
Per pt email: I am past due for a new C-PAP machine and have worked with [facility]. - Dr. Maple Hudson please consider a prescription for a C-PAP machine with a signed note indicating I am being treated by you and that I continue to use and receive benefit from use of the machine. The pressure setting is 14. When completed, please fax the two documents to: Attn: Ellen Henri, [address] [place], [place] - fax number [phone]. Thank you in advance for assisting me in securing this unit. ---  This is a former [facility] pt. Scheduled to see Dr. Maple Hudson on 04/17/15. Please advise Dr. Maple Hudson thanks

## 2015-03-06 ENCOUNTER — Encounter: Payer: Self-pay | Admitting: Internal Medicine

## 2015-03-07 NOTE — Telephone Encounter (Signed)
Called [facility] at [phone] and spoke with Burna Mortimer. She is requesting last OV note. I have sent this from Feb 2016. She will let us know if pt will need an OV per insurance. Nothing further needed

## 2015-03-09 ENCOUNTER — Telehealth: Payer: Self-pay | Admitting: Internal Medicine

## 2015-03-09 NOTE — Telephone Encounter (Signed)
Form has been signed by Zack Seal and I faxed to [employer]. Form given back to Katie to place in scan folder.  Called and spoke to [place] at [facility] and informed her of the form being faxed. Erica verbalized understanding and denied any further questions or concerns at this time.

## 2015-03-22 ENCOUNTER — Other Ambulatory Visit: Payer: Self-pay | Admitting: *Deleted

## 2015-03-22 MED ORDER — RIVAROXABAN 20 MG PO TABS: 20.0000 mg | ORAL_TABLET | Freq: Every day | ORAL | Status: DC

## 2015-04-17 ENCOUNTER — Ambulatory Visit: Payer: Medicare Other | Admitting: Internal Medicine

## 2015-04-21 ENCOUNTER — Ambulatory Visit: Payer: Medicare Other | Admitting: Internal Medicine

## 2015-04-28 ENCOUNTER — Encounter: Payer: Self-pay | Admitting: "Endocrinology

## 2015-04-28 ENCOUNTER — Ambulatory Visit (INDEPENDENT_AMBULATORY_CARE_PROVIDER_SITE_OTHER): Payer: Medicare Other | Admitting: "Endocrinology

## 2015-04-28 VITALS — BP 156/87 | HR 77 | Ht 69.5 in | Wt 317.0 lb

## 2015-04-28 DIAGNOSIS — E1165 Type 2 diabetes mellitus with hyperglycemia: Secondary | ICD-10-CM | POA: Insufficient documentation

## 2015-04-28 DIAGNOSIS — I10 Essential (primary) hypertension: Secondary | ICD-10-CM | POA: Insufficient documentation

## 2015-04-28 DIAGNOSIS — E039 Hypothyroidism, unspecified: Secondary | ICD-10-CM | POA: Insufficient documentation

## 2015-04-28 DIAGNOSIS — E785 Hyperlipidemia, unspecified: Secondary | ICD-10-CM

## 2015-04-28 DIAGNOSIS — IMO0002 Reserved for concepts with insufficient information to code with codable children: Secondary | ICD-10-CM | POA: Insufficient documentation

## 2015-04-28 DIAGNOSIS — E1169 Type 2 diabetes mellitus with other specified complication: Secondary | ICD-10-CM

## 2015-04-28 NOTE — Patient Instructions (Signed)

## 2015-04-29 NOTE — Progress Notes (Signed)
Subjective:    Patient ID: [name] [name], male    DOB: 05/18/1951,    Past Medical History  Diagnosis Date  . Obesity   . Hypertension   . OSA (obstructive sleep apnea)     CPAP  . Depression   . 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 Dyspnea   . Diabetes mellitus   . Hyperlipidemia   . Coronary artery disease (CAD) excluded 2008, 2011    Mild coronary plaque.  w/ normal LV function. repeated cardiac cath on 07/2009  . 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 History of endocarditis     status post Enterococcus faecalis, secondary to chronic injections with low molecular weight heparin  . Deep venous thrombosis (HCC)     recurrent DVT and PE, status post IVC filter   Past Surgical History  Procedure Laterality Date  . Thyroidectomy    . Cardiac catheterization  2008  . Cardiac catheterization  07/2009    minor irregulreties without obstructive disease  . Mitral valve repair  04/10/2011    autologous pericardial patch augmentation of posterior leaflet with 28mm Sorin Memo 3D ring annuloplasty  . Aortic valve replacement  04/10/2011    25mm [facility] Ease pericardial bioprosthetic aortic valve   Social History   Social History  . Marital Status: Married    Spouse Name: RITA  . Number of Children: 1  . Years of Education: N/A   Occupational History  . disabled    Social History Main Topics  . Smoking status: Former Smoker -- 1.00 packs/day for 6 years    Types: Cigarettes    Quit date: 01/22/1974  . Smokeless tobacco: Never Used  . Alcohol Use: No  . Drug Use: No  . Sexual Activity: Not Asked   Other Topics Concern  . None   Social History Narrative   Outpatient Encounter Prescriptions as of 04/28/2015  Medication Sig  . acetaminophen (TYLENOL) 500 MG tablet Take 500 mg by mouth every 6 (six) hours as  needed.  Marland Kitchen aspirin 325 MG tablet Take 325 mg by mouth daily.    . B-D UF III MINI PEN NEEDLES 31G X 5 MM MISC TEST ONCE DAILY AT BEDTIME  . diazepam (VALIUM) 5 MG tablet Take 1 tablet by mouth daily.  . Exenatide ER (BYDUREON) 2 MG PEN Inject into the skin once a week.  Marland Kitchen FLUoxetine (PROZAC) 20 MG capsule Take 20 mg by mouth daily.   . furosemide (LASIX) 40 MG tablet Take 40 mg by mouth Daily.   Marland Kitchen HYDROcodone-acetaminophen (VICODIN) 5-500 MG per tablet Take 1 tablet by mouth every 6 (six) hours as needed.  Marland Kitchen levothyroxine (SYNTHROID, LEVOTHROID) 200 MCG tablet Take 200 mcg by mouth daily before breakfast.   . lisinopril (PRINIVIL,ZESTRIL) 10 MG tablet Take 1 tablet (10 mg total) by mouth 2 (two) times daily. take 1 tablet by mouth once daily  . metFORMIN (GLUCOPHAGE) 500 MG tablet Take 1,000 mg by mouth 2 (two) times daily after a meal. Take 2 pills twice a day  . metoprolol tartrate (LOPRESSOR) 25 MG tablet take 1 tablet by mouth once daily  . NON FORMULARY CPAP   . omeprazole (PRILOSEC) 20 MG capsule Take 20 mg by mouth at bedtime.   . pramipexole (MIRAPEX) 0.25 MG tablet Take 2 tablets by mouth after  dinner each night  . rivaroxaban (XARELTO) 20 MG TABS tablet Take 1 tablet (20 mg total) by mouth daily.  . simvastatin (ZOCOR) 40 MG tablet Take 1 tablet (40 mg total) by mouth at bedtime.   No facility-administered encounter medications on file as of 04/28/2015.   ALLERGIES: No Known Allergies VACCINATION STATUS: Immunization History  Administered Date(s) Administered  . Influenza-Unspecified 03/24/2014    Diabetes He presents for his initial diabetic visit. He has type 2 diabetes mellitus. Onset time: He was diagnosed at approximate age of 64 years. There are no hypoglycemic associated symptoms. Pertinent negatives for hypoglycemia include no confusion, headaches, pallor or seizures. Associated symptoms include fatigue, polydipsia and polyuria. Pertinent negatives for diabetes include  no chest pain, no polyphagia and no weakness. There are no hypoglycemic complications. Symptoms are worsening. Diabetic complications include PVD. Pertinent negatives for diabetic complications include no CVA or heart disease. (CHF, peripheral arterial disease.) Risk factors for coronary artery disease include diabetes mellitus, dyslipidemia, hypertension, male sex, obesity, sedentary lifestyle and tobacco exposure. Current diabetic treatment includes oral agent (monotherapy) (bydureon 2 mg weekly.). His weight is increasing steadily. He is following a generally unhealthy diet. He has not had a previous visit with a dietitian. He never participates in exercise. Home blood sugar record trend: He did not bring any meter nor log book. An ACE inhibitor/angiotensin II receptor blocker is being taken. He does not see a podiatrist.Eye exam is not current.  Hyperlipidemia This is a chronic problem. The current episode started more than 1 year ago. The problem is uncontrolled. Exacerbating diseases include hypothyroidism. Pertinent negatives include no chest pain, myalgias or shortness of breath. Current antihyperlipidemic treatment includes statins and ezetimibe. Risk factors for coronary artery disease include diabetes mellitus, dyslipidemia, hypertension, male sex, obesity and a sedentary lifestyle.  Hypertension This is a chronic problem. The current episode started more than 1 year ago. The problem is uncontrolled. Pertinent negatives include no chest pain, headaches, neck pain, palpitations or shortness of breath. Risk factors for coronary artery disease include diabetes mellitus, dyslipidemia, family history, male gender, obesity, sedentary lifestyle and smoking/tobacco exposure. Past treatments include ACE inhibitors. Hypertensive end-organ damage includes PVD. There is no history of CVA.      Review of Systems  Constitutional: Positive for fatigue. Negative for unexpected weight change.  HENT: Negative  for dental problem, mouth sores and trouble swallowing.   Eyes: Negative for visual disturbance.  Respiratory: Negative for cough, choking, chest tightness, shortness of breath and wheezing.   Cardiovascular: Negative for chest pain, palpitations and leg swelling.  Gastrointestinal: Negative for nausea, vomiting, abdominal pain, diarrhea, constipation and abdominal distention.  Endocrine: Positive for polydipsia and polyuria. Negative for polyphagia.  Genitourinary: Negative for dysuria, urgency, hematuria and flank pain.  Musculoskeletal: Negative for myalgias, back pain, gait problem and neck pain.  Skin: Negative for pallor, rash and wound.  Neurological: Negative for seizures, syncope, weakness, numbness and headaches.  Psychiatric/Behavioral: Negative.  Negative for confusion and dysphoric mood.    Objective:    BP 156/87 mmHg  Pulse 77  Ht 5' 9.5" (1.765 m)  Wt 317 lb (143.79 kg)  BMI 46.16 kg/m2  SpO2 97%  Wt Readings from Last 3 Encounters:  04/28/15 317 lb (143.79 kg)  02/06/15 321 lb 12.8 oz (145.968 kg)  08/09/14 326 lb 12.8 oz (148.236 kg)    Physical Exam  Constitutional: He is oriented to person, place, and time. He appears well-developed and well-nourished. He is cooperative. No distress.  HENT:  Head: Normocephalic and atraumatic.  Eyes: EOM are normal.  Neck: Normal range of motion. Neck supple. No tracheal deviation present. No thyromegaly present.  Cardiovascular: Normal rate, S1 normal, S2 normal and normal heart sounds.  Exam reveals no gallop.   No murmur heard. Pulses:      Dorsalis pedis pulses are 1+ on the right side, and 1+ on the left side.       Posterior tibial pulses are 1+ on the right side, and 1+ on the left side.  Pulmonary/Chest: Breath sounds normal. No respiratory distress. He has no wheezes.  Abdominal: Soft. Bowel sounds are normal. He exhibits no distension. There is no tenderness. There is no guarding and no CVA tenderness.   Musculoskeletal: He exhibits no edema.       Right shoulder: He exhibits no swelling and no deformity.  Neurological: He is alert and oriented to person, place, and time. He has normal strength and normal reflexes. No cranial nerve deficit or sensory deficit. Gait normal.  Skin: Skin is warm and dry. No rash noted. No cyanosis. Nails show no clubbing.  Psychiatric: He has a normal mood and affect. His speech is normal and behavior is normal. Judgment and thought content normal. Cognition and memory are normal.    Results for orders placed or performed in visit on 09/03/13  CBC  Result Value Ref Range   WBC 8.3 4.0 - 10.5 K/uL   RBC 5.02 4.22 - 5.81 MIL/uL   Hemoglobin 11.9 (L) 13.0 - 17.0 g/dL   HCT 11.937.6 (L) 14.739.0 - 82.952.0 %   MCV 74.9 (L) 78.0 - 100.0 fL   MCH 23.7 (L) 26.0 - 34.0 pg   MCHC 31.6 30.0 - 36.0 g/dL   RDW 56.217.5 (H) 13.011.5 - 86.515.5 %   Platelets 239 150 - 400 K/uL   Complete Blood Count (Most recent): Lab Results  Component Value Date   WBC 8.3 09/03/2013   HGB 11.9* 09/03/2013   HCT 37.6* 09/03/2013   MCV 74.9* 09/03/2013   PLT 239 09/03/2013   Chemistry (most recent): Lab Results  Component Value Date   NA 137 08/13/2013   K 5.1 08/13/2013   CL 101 08/13/2013   CO2 27 08/13/2013   BUN 14 08/13/2013   CREATININE 0.82 08/13/2013   Diabetic Labs (most recent): Lab Results  Component Value Date   HGBA1C 7.7* 08/13/2013   HGBA1C 6.9* 04/09/2011   Lipid profile (most recent): Lab Results  Component Value Date   TRIG 122 08/13/2013   CHOL 131 08/13/2013         Assessment & Plan:   1. Uncontrolled type 2 diabetes mellitus with other specified complication [facility](HCC)   - Patient has currently uncontrolled symptomatic type 2 DM since  64 years of age,  with most recent A1c of 7.9 %. Recent labs reviewed. -No recent labs to review, I will request a set of labs before his next visit.    diabetes  remains at a high risk for more acute and chronic complications  of diabetes which include CAD, CVA, CKD, retinopathy, and neuropathy. These are all discussed in detail with the patient.  - I have counseled the patient on diet management and weight loss, by adopting a carbohydrate restricted/protein rich diet.  - Suggestion is made for patient to avoid simple carbohydrates   from their diet including Cakes , Desserts, Ice Cream,  Soda (  diet and regular) , Sweet Tea , Candies,  Chips, Cookies, Artificial  Sweeteners,   and "Sugar-free" Products . This will help patient to have stable blood glucose profile and potentially avoid unintended weight gain.  - I encouraged the patient to switch to  unprocessed or minimally processed complex starch and increased protein intake (animal or plant source), fruits, and vegetables.  - Patient is advised to stick to a routine mealtimes to eat 3 meals  a day and avoid unnecessary snacks ( to snack only to correct hypoglycemia).  - The patient will be scheduled with Norm Salt, RDN, CDE for individualized DM education.  - I have approached patient with the following individualized plan to manage diabetes and patient agrees:   - I  I asked him to initiate  strict monitoring of glucose  AC and HS for 2 weeks. -He will return for review and adjustment of his therapy.  -Patient is encouraged to call clinic for blood glucose levels less than 70 or above 300 mg /dl. - I will continue metformin 1000 mg by mouth twice a day and bydureon 2 mg weekly, therapeutically suitable for patient..   - Patient specific target  A1c;  LDL, HDL, Triglycerides, and  Waist Circumference were discussed in detail.  2) BP/HTN: Uncontrolled. Continue current medications including ACEI/ARB. 3) Lipids/HPL:   continue statins. 4)  Weight/Diet: CDE Consult will be initiated , exercise, and detailed carbohydrates information provided.  5) Primary hypothyroidism He is advised to continue levothyroxine 200 g by mouth every morning.  - We discussed  about correct intake of levothyroxine, at fasting, with water, separated by at least 30 minutes from breakfast, and separated by more than 4 hours from calcium, iron, multivitamins, acid reflux medications (PPIs). -Patient is made aware of the fact that thyroid hormone replacement is needed for life, dose to be adjusted by periodic monitoring of thyroid function tests.  6) Chronic Care/Health Maintenance:  -Patient  Is  on ACEI/ARB and Statin medications and encouraged to continue to follow up with Ophthalmology, Podiatrist at least yearly or according to recommendations, and advised to   stay away from smoking. I have recommended yearly flu vaccine and pneumonia vaccination at least every 5 years; moderate intensity exercise for up to 150 minutes weekly; and  sleep for at least 7 hours a day.   Patient to bring meter and  blood glucose logs during their next visit.   I advised patient to maintain close follow up with their PCP for primary care needs. Follow up plan: Return in about 2 weeks (around 05/12/2015) for diabetes, high blood pressure, high cholesterol, follow up with pre-visit labs, meter, and logs, underactive thyroid.  Marquis Lunch, MD Phone: [phone]  Fax: [phone]   04/29/2015, 8:54 AM

## 2015-05-15 ENCOUNTER — Ambulatory Visit: Payer: Medicare Other | Admitting: "Endocrinology

## 2015-05-25 ENCOUNTER — Ambulatory Visit: Payer: Self-pay | Admitting: "Endocrinology

## 2015-07-07 ENCOUNTER — Ambulatory Visit: Payer: Medicare Other | Admitting: Internal Medicine

## 2015-07-11 ENCOUNTER — Encounter: Payer: Self-pay | Admitting: Cardiovascular Disease

## 2015-07-12 ENCOUNTER — Encounter: Payer: Self-pay | Admitting: *Deleted

## 2015-07-12 NOTE — Telephone Encounter (Signed)
Left message for patient to call back. Patient needs an appointment with PA for follow-up. Patient requested an earlier appointment with Dr. Eden Emms, but the next available is in March.

## 2015-07-13 NOTE — Progress Notes (Signed)
Patient ID: [name] [name], male   DOB: 03-07-1951, 65 y.o.   MRN: [id number] Mr. [name] [name] is a 65 y.o.  patient  we are following for ongoing assessment and management of CAD, severe aortic insufficiency, status post aortic valve replacement using a 25 mm Edwards magna ease pericardial tissue valve, and mitral valve repair with complex valvuloplasty including autolgous or cardio patch repair of the posterior leaflet using a 28 mm Memo- 3-D ring annuloplasty. This was completed on October of 2012.  The patient was continued on Xarelto, with a history of multiple DVTs and IVC filter placed. He also has a history of varicosities of the lower extremity and was advised to see a vein specialists for evaluation and treatment.   He denies chest pain, palpitations. He admits that his weight is increasing.   F/U echo done 3/15  Reviewed  Impressions:  - Mild LVH with LVEF 60-65%, grade 2 diastolic dysfunction. Severe left atrial enlargement. Mitral annuloplasty with thickened leaflets, no mitral regurgitation. Somewhat discordant area information - cannot exclude mild to moderate stenotic transmitral flow. Normal bioprosthetic aorticvalve function. Trivial triscupid regurgitation with RV-RA gradient 31 mmHg. Mild right atrial enlargement.    Lab Results  Component Value Date   HGBA1C 7.7* 08/13/2013   Dr Margo Common in [place] following most recent labs Has significant left knee and hip issues.  Was told by Dr Hilda Lias in [place] He is too high risk of hip replacement.    ROS: Denies fever, malais, weight loss, blurry vision, decreased visual acuity, cough, sputum, SOB, hemoptysis, pleuritic pain, palpitaitons, heartburn, abdominal pain, melena, lower extremity edema, claudication, or rash.  All other systems reviewed and negative  General: Affect appropriate Obese white male  HEENT: normal Neck supple with no adenopathy JVP normal no bruits no thyromegaly Lungs clear with no wheezing  and good diaphragmatic motion Heart:  S1/S2 SEM no AR  murmur, no rub, gallop or click PMI normal Abdomen: benighn, BS positve, no tenderness, no AAA no bruit.  No HSM or HJR Distal pulses intact with no bruits Plus one edema with marked bilateral varicose veins especially LU thigh  Neuro non-focal Skin warm and dry No muscular weakness   Current Outpatient Prescriptions  Medication Sig Dispense Refill  . aspirin 325 MG tablet Take 325 mg by mouth daily.      . diazepam (VALIUM) 5 MG tablet Take 1 tablet by mouth daily.  0  . Exenatide ER (BYDUREON) 2 MG PEN Inject into the skin once a week.    Marland Kitchen FLUoxetine (PROZAC) 40 MG capsule Take 40 mg by mouth daily.    . furosemide (LASIX) 40 MG tablet Take 40 mg by mouth Daily.     Marland Kitchen HYDROcodone-acetaminophen (VICODIN) 5-500 MG per tablet Take 1 tablet by mouth every 6 (six) hours as needed for pain.     Marland Kitchen levothyroxine (SYNTHROID, LEVOTHROID) 200 MCG tablet Take 200 mcg by mouth daily before breakfast.     . lisinopril (PRINIVIL,ZESTRIL) 10 MG tablet Take 10 mg by mouth daily.    . metFORMIN (GLUCOPHAGE) 500 MG tablet Take 1,000 mg by mouth 2 (two) times daily after a meal.     . metoprolol tartrate (LOPRESSOR) 25 MG tablet take 1 tablet by mouth once daily 90 tablet 3  . omeprazole (PRILOSEC) 20 MG capsule Take 20 mg by mouth at bedtime.     . pramipexole (MIRAPEX) 0.25 MG tablet Take 2 tablets by mouth after dinner each night 60 tablet 3  . rivaroxaban (  XARELTO) 20 MG TABS tablet Take 1 tablet (20 mg total) by mouth daily. 30 tablet 11  . simvastatin (ZOCOR) 40 MG tablet Take 1 tablet (40 mg total) by mouth at bedtime. 90 tablet 0  . [DISCONTINUED] ONGLYZA 5 MG TABS tablet Take 5 mg by mouth daily.      No current facility-administered medications for this visit.    Allergies  Review of patient's allergies indicates no known allergies.  Electrocardiogram:  5/14  SR rate 80  Long QT   05/2014   SR rate 75  Nonspecific inferior ST  changes   Assessment and Plan AVR:  Tissue AVR with no residual AR and normal systolic gradients MV:  Repair  Somewhat "tight"  Continue beta blocker to maximize diastolic filling time.  Post repair EF normal  SBE   DVT:  With IVC filter on chronic anticoagulation with xarelto  Stop 2 days before any hip surgery  Arthritis:  I think he is an acceptable candidate for hip surgery Will refer him to Dr Delynn Flavin for 2nd opinion   F/U with me in 6 months

## 2015-07-14 ENCOUNTER — Ambulatory Visit (INDEPENDENT_AMBULATORY_CARE_PROVIDER_SITE_OTHER): Payer: Medicare Other | Admitting: Cardiovascular Disease

## 2015-07-14 ENCOUNTER — Encounter: Payer: Self-pay | Admitting: Cardiovascular Disease

## 2015-07-14 VITALS — BP 126/78 | HR 74 | Ht 71.0 in | Wt 315.1 lb

## 2015-07-14 DIAGNOSIS — I351 Nonrheumatic aortic (valve) insufficiency: Secondary | ICD-10-CM | POA: Diagnosis not present

## 2015-07-14 DIAGNOSIS — I10 Essential (primary) hypertension: Secondary | ICD-10-CM | POA: Diagnosis not present

## 2015-07-14 NOTE — Patient Instructions (Addendum)

## 2015-07-18 ENCOUNTER — Other Ambulatory Visit (HOSPITAL_COMMUNITY): Payer: Self-pay | Admitting: Orthopaedic Surgery

## 2015-07-20 ENCOUNTER — Encounter: Payer: Self-pay | Admitting: Cardiovascular Disease

## 2015-07-26 ENCOUNTER — Telehealth (INDEPENDENT_AMBULATORY_CARE_PROVIDER_SITE_OTHER): Payer: Self-pay | Admitting: *Deleted

## 2015-07-26 NOTE — Telephone Encounter (Signed)
Received a call from [name] [name].  He had a TCS with Dr. Karilyn Cota when we were at [facility].  He is needing to have hip surgery and Dr. Margo Common has done labs and his hmg is low.   Dr. Margo Common has recommended he have a TCS to find the source of bleeding and had suggested he have with one of them their but he only wants Dr. Karilyn Cota.  He knows he is asking a lot.  His surgery is scheduled for 08/08/2015.  He is on blood thinner and see Dr. Estrella Myrtle at [place].    Can we see if we can get records from [facility] and labs from [facility] office and let Dr. Karilyn Cota review.  I told him we would call him back as soon as we could we recommendations.  [phone] or [phone]

## 2015-07-26 NOTE — Telephone Encounter (Addendum)
See note below -- rec'd labs from Dr Margo Common, Hct 34.4 & Hgb 10.1, last TCS 12/2009, had tubular adenomas removed chart on your desk

## 2015-07-27 ENCOUNTER — Encounter (INDEPENDENT_AMBULATORY_CARE_PROVIDER_SITE_OTHER): Payer: Self-pay

## 2015-07-27 NOTE — Telephone Encounter (Signed)
I feel our office handled and expedited this and had him an answer less than 24 hours.    Thank you all for all of your hard work to try to help Mr. [name] [name]

## 2015-07-27 NOTE — Telephone Encounter (Signed)
Spoke to [name] [name] to scheduled TCS next per Dr Karilyn Cota, patient stated he already had that taken care of and didn't need to schedule but he hoped we would be able to help him the next time he calls on [place]

## 2015-07-27 NOTE — Pre-Procedure Instructions (Signed)
[name] [name]  07/27/2015     Your procedure is scheduled on : Tuesday August 08, 2015 at 3:16 PM.  Report to [facility] Admitting at 1:15 PM.  Call this number if you have problems the morning of surgery: [phone]    Remember:  Do not eat food or drink liquids after midnight.  Take these medicines the morning of surgery with A SIP OF WATER : Acetaminophen (Tylenol), Fluoxetine (Prozac), Hydrocodone if needed, Levothyroxine (Synthroid),  Visine eye drops   Stop taking any vitamins, herbal medications/supplements, NSAIDs, Ibuprofen, Advil, Motrin, Aleve, etc   Please follow your physicans instructions regarding Xarelto   Do NOT take any diabetic pills the morning of your surgery (NO Metformin/Glucophage)   Bring CPAP mask the day of your surgery  How to Manage Your Diabetes Before Surgery   Why is it important to control my blood sugar before and after surgery?   Improving blood sugar levels before and after surgery helps healing and can limit problems.  A way of improving blood sugar control is eating a healthy diet by:  - Eating less sugar and carbohydrates  - Increasing activity/exercise  - Talk with your doctor about reaching your blood sugar goals  High blood sugars (greater than 180 mg/dL) can raise your risk of infections and slow down your recovery so you will need to focus on controlling your diabetes during the weeks before surgery.  Make sure that the doctor who takes care of your diabetes knows about your planned surgery including the date and location.  How do I manage my blood sugars before surgery?   Check your blood sugar at least 4 times a day, 2 days before surgery to make sure that they are not too high or low.   Check your blood sugar the morning of your surgery when you wake up and every 2 hours until you get to the Short-Stay unit.  If your blood sugar is less than 70 mg/dL, you will need to treat for low blood sugar by:  Treat a low  blood sugar (less than 70 mg/dL) with 1/2 cup of clear juice (cranberry or apple), 4 glucose tablets, OR glucose gel.  Recheck blood sugar in 15 minutes after treatment (to make sure it is greater than 70 mg/dL).  If blood sugar is not greater than 70 mg/dL on re-check, call [phone] for further instructions.   Report your blood sugar to the Short-Stay nurse when you get to Short-Stay.  References:  [facility], 2007 "How to Manage your Diabetes Before and After Surgery".  What do I do about my diabetes medications?   Do not take oral diabetes medicines (pills) the morning of surgery.   Do not wear jewelry.  Do not wear lotions, powders, or cologne.    Men may shave face and neck.  Do not bring valuables to the hospital.  [facility] is not responsible for any belongings or valuables.  Contacts, dentures or bridgework may not be worn into surgery.  Leave your suitcase in the car.  After surgery it may be brought to your room.  For patients admitted to the hospital, discharge time will be determined by your treatment team.  Patients discharged the day of surgery will not be allowed to drive home.   Name and phone number of your driver:    Special instructions:  Shower using CHG soap the night before and the morning of your surgery  Please read over the following  fact sheets that you were given. Pain Booklet, Coughing and Deep Breathing, Total Joint Packet, MRSA Information and Surgical Site Infection Prevention

## 2015-07-28 ENCOUNTER — Encounter (HOSPITAL_COMMUNITY)
Admission: RE | Admit: 2015-07-28 | Discharge: 2015-07-28 | Disposition: A | Discharge status: Home / Self Care | Payer: Medicare Other | Source: Ambulatory Visit | Attending: Orthopaedic Surgery | Admitting: Orthopaedic Surgery

## 2015-07-28 ENCOUNTER — Other Ambulatory Visit: Payer: Self-pay

## 2015-07-28 ENCOUNTER — Encounter (HOSPITAL_COMMUNITY): Payer: Self-pay

## 2015-07-28 DIAGNOSIS — I110 Hypertensive heart disease with heart failure: Secondary | ICD-10-CM | POA: Diagnosis not present

## 2015-07-28 DIAGNOSIS — G4733 Obstructive sleep apnea (adult) (pediatric): Secondary | ICD-10-CM | POA: Insufficient documentation

## 2015-07-28 DIAGNOSIS — Z01812 Encounter for preprocedural laboratory examination: Secondary | ICD-10-CM | POA: Diagnosis not present

## 2015-07-28 DIAGNOSIS — M1612 Unilateral primary osteoarthritis, left hip: Secondary | ICD-10-CM | POA: Insufficient documentation

## 2015-07-28 DIAGNOSIS — Z79899 Other long term (current) drug therapy: Secondary | ICD-10-CM | POA: Insufficient documentation

## 2015-07-28 DIAGNOSIS — Z01818 Encounter for other preprocedural examination: Secondary | ICD-10-CM | POA: Insufficient documentation

## 2015-07-28 DIAGNOSIS — Z7982 Long term (current) use of aspirin: Secondary | ICD-10-CM | POA: Diagnosis not present

## 2015-07-28 DIAGNOSIS — Z86718 Personal history of other venous thrombosis and embolism: Secondary | ICD-10-CM | POA: Diagnosis not present

## 2015-07-28 DIAGNOSIS — I509 Heart failure, unspecified: Secondary | ICD-10-CM | POA: Insufficient documentation

## 2015-07-28 DIAGNOSIS — I2510 Atherosclerotic heart disease of native coronary artery without angina pectoris: Secondary | ICD-10-CM | POA: Insufficient documentation

## 2015-07-28 DIAGNOSIS — Z87891 Personal history of nicotine dependence: Secondary | ICD-10-CM | POA: Insufficient documentation

## 2015-07-28 DIAGNOSIS — Z86711 Personal history of pulmonary embolism: Secondary | ICD-10-CM | POA: Diagnosis not present

## 2015-07-28 DIAGNOSIS — E785 Hyperlipidemia, unspecified: Secondary | ICD-10-CM | POA: Insufficient documentation

## 2015-07-28 DIAGNOSIS — K219 Gastro-esophageal reflux disease without esophagitis: Secondary | ICD-10-CM | POA: Diagnosis not present

## 2015-07-28 DIAGNOSIS — Z7984 Long term (current) use of oral hypoglycemic drugs: Secondary | ICD-10-CM | POA: Insufficient documentation

## 2015-07-28 DIAGNOSIS — E119 Type 2 diabetes mellitus without complications: Secondary | ICD-10-CM | POA: Insufficient documentation

## 2015-07-28 HISTORY — DX: Unspecified osteoarthritis, unspecified site: M19.90

## 2015-07-28 HISTORY — DX: Gastro-esophageal reflux disease without esophagitis: K21.9

## 2015-07-28 HISTORY — DX: Heart failure, unspecified: I50.9

## 2015-07-28 HISTORY — DX: Restless legs syndrome: G25.81

## 2015-07-28 LAB — SURGICAL PCR SCREEN
MRSA, PCR: NEGATIVE
Staphylococcus aureus: POSITIVE — AB

## 2015-07-28 LAB — BASIC METABOLIC PANEL
Anion gap: 12 (ref 5–15)
BUN: 14 mg/dL (ref 6–20)
CALCIUM: 9.3 mg/dL (ref 8.9–10.3)
CO2: 24 mmol/L (ref 22–32)
CREATININE: 0.96 mg/dL (ref 0.61–1.24)
Chloride: 102 mmol/L (ref 101–111)
Glucose, Bld: 148 mg/dL — ABNORMAL HIGH (ref 65–99)
Potassium: 4.5 mmol/L (ref 3.5–5.1)
SODIUM: 138 mmol/L (ref 135–145)

## 2015-07-28 LAB — CBC
HCT: 34.2 % — ABNORMAL LOW (ref 39.0–52.0)
Hemoglobin: 10.1 g/dL — ABNORMAL LOW (ref 13.0–17.0)
MCH: 22.4 pg — ABNORMAL LOW (ref 26.0–34.0)
MCHC: 29.5 g/dL — ABNORMAL LOW (ref 30.0–36.0)
MCV: 76 fL — ABNORMAL LOW (ref 78.0–100.0)
PLATELETS: 225 10*3/uL (ref 150–400)
RBC: 4.5 MIL/uL (ref 4.22–5.81)
RDW: 16.8 % — ABNORMAL HIGH (ref 11.5–15.5)
WBC: 7.3 10*3/uL (ref 4.0–10.5)

## 2015-07-28 NOTE — Progress Notes (Signed)
PCP is Wyvonnia Lora  Cardiologist is Charlton Haws. Patient denied having any acute cardiac or pulmonary issues  Nurse inquired about fasting blood glucose levels and patient informed Nurse that his blood sugars range from 185 to 75. CBG on arrival to PAT was 168, and patient stated he consumed a peanut butter sandwich and some cereal, and drank some coffee and diet Dr. Reino Kent.  Patient informed Nurse that he was instructed to "stop taking Xarelto two days prior to surgery and there would be no bridge."  Patients wife at chair side during PAT visit.

## 2015-07-28 NOTE — Progress Notes (Signed)
Mupirocin Ointment Rx called into [facility] in [place] for positive PCR of Staph. Left message on pt's voicemail notifying him of results and need to pick up Rx.

## 2015-07-28 NOTE — Telephone Encounter (Signed)
Patient apparently does not need colonoscopy now

## 2015-07-29 LAB — HEMOGLOBIN A1C
HEMOGLOBIN A1C: 7.3 % — AB (ref 4.8–5.6)
Mean Plasma Glucose: 163 mg/dL

## 2015-07-31 NOTE — Progress Notes (Signed)
Anesthesia Chart Review:  Pt is a 65 year old male scheduled for L total hip arthroplasty anterior approach on 08/08/2015 with Dr. Maureen Ralphs.   Cardiologist is Dr. Charlton Haws.   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 Former smoker. BMI 44.   Medications include: ASA, exenatide, lasix, levothyroxine, lisinopril, metformin, metoprolol, prilosec, mirapex, xarelto, simvastatin. Pt to stop xarelto 2 days prior to surgery.   Preoperative labs reviewed.  Glucose 148, hgbA1c 7.3.   EKG 07/28/15: NSR.   Echo 08/26/13:  - Left ventricle: The cavity size was normal. Wall thickness was increased in a pattern of mild LVH. Systolic function was normal. The estimated ejection fraction was in the range of 60% to 65%. Wall motion was normal; there were no regional wall motion abnormalities. Features are consistent with a pseudonormal left ventricular filling pattern, with concomitant abnormal relaxation and increased filling pressure (grade 2 diastolic dysfunction). - Aortic valve: A 25mm bioprosthesis was present. Overall normal function. No significant regurgitation. No perivalvular leak. Mean gradient: 12mm Hg (S). LVOT/AV VTI ratio 0.68. - Mitral valve: Moderately thickened leaflets. A 28mm annular ring prosthesis was present. No significant regurgitation. Pressure half-time: 94ms. Mean gradient: 11mm Hg (D). Valve area by pressure half-time: 2.34cm^2. Valve area by continuity equation (using LVOT flow): 1.44cm^2. Some/what discordant measurements - pressure half-time suggests normal valve area, although gradients and continuity measures indicate potentially mild to moderate stenotic flow. Leaflet motion is not well seen. - Left atrium: The atrium was severely dilated. - Right ventricle: Poorly visualized. Systolic function was  normal. - Right atrium: The atrium was mildly dilated. - Tricuspid valve: Trivial regurgitation. Peak RV-RA gradient: 31mm Hg (S). - Inferior vena cava: Not visualized. Unable to estimate CVP. - Pericardium, extracardiac: There was no pericardial effusion. Impressions: - Mild LVH with LVEF 60-65%, grade 2 diastolic dysfunction.Severe left atrial enlargement. Mitral annuloplasty with thickened leaflets, no mitral regurgitation. Somewhat discordant area information - cannot exclude mild tomoderate stenotic transmitral flow. Normal bioprosthetic aorticvalve function. Trivial triscupid regurgitation withRV-RA gradient 31 mmHg. Mild right atrial enlargement.  Cardiac cath 04/09/11:  1. Severe aortic regurgitation by angiography. 2. Elevated pulmonary capillary wedge pressure with prominent V-wave into the 40s. 3. Pulmonary hypertension likely passive associated with elevated left ventricular end-diastolic pressure as manifested by the pulmonary capillary wedge. 4. Scattered coronary plaques without high-grade coronary obstruction (LAD 30%, CX 30-40%, PDA 50%, RCA 30%).  Pt has cardiac clearance for surgery from Dr. Eden Emms in Epic note dated 07/13/15.   If no changes, I anticipate pt can proceed with surgery as scheduled.   Rica Mast, FNP-BC [facility]/Anesthesiology Phone: [phone] 07/31/2015 2:44 PM

## 2015-08-01 ENCOUNTER — Other Ambulatory Visit: Payer: Self-pay | Admitting: Cardiovascular Disease

## 2015-08-07 MED ORDER — DEXTROSE 5 % IV SOLN
3.0000 g | INTRAVENOUS | Status: AC
  Filled 2015-08-07: qty 3000

## 2015-08-08 MED ORDER — MIDAZOLAM HCL 2 MG/2ML IJ SOLN
INTRAMUSCULAR | Status: AC
  Filled 2015-08-08: qty 2

## 2015-08-08 MED ORDER — FENTANYL CITRATE (PF) 250 MCG/5ML IJ SOLN
INTRAMUSCULAR | Status: AC
  Filled 2015-08-08: qty 5

## 2015-08-09 NOTE — Progress Notes (Signed)
Called [name] [name] with time of arrival for surgery tomorrow. Instructed him to be here at 12:45 PM. [name] [name] states he's concerned that he's been off his Xarelto for 5 days and wanted to know if he needed to be bridged. I told him to call Dr. Fabio Bering office and speak with his nurse to make sure. He voiced understanding.

## 2015-08-10 ENCOUNTER — Inpatient Hospital Stay (HOSPITAL_COMMUNITY)
Admission: RE | Admit: 2015-08-10 | Discharge: 2015-08-13 | DRG: 470 | Disposition: A | Discharge status: Home Health | Payer: Medicare Other | Source: Ambulatory Visit | Attending: Orthopaedic Surgery | Admitting: Orthopaedic Surgery

## 2015-08-10 ENCOUNTER — Inpatient Hospital Stay (HOSPITAL_COMMUNITY): Payer: Medicare Other | Admitting: Anesthesiology

## 2015-08-10 ENCOUNTER — Inpatient Hospital Stay (HOSPITAL_COMMUNITY): Payer: Medicare Other | Admitting: Emergency Medicine

## 2015-08-10 ENCOUNTER — Encounter (HOSPITAL_COMMUNITY): Payer: Self-pay | Admitting: Surgery

## 2015-08-10 ENCOUNTER — Inpatient Hospital Stay (HOSPITAL_COMMUNITY): Payer: Medicare Other

## 2015-08-10 ENCOUNTER — Encounter (HOSPITAL_COMMUNITY)
Admission: RE | Disposition: A | Discharge status: Home Health | Payer: Self-pay | Source: Ambulatory Visit | Attending: Orthopaedic Surgery

## 2015-08-10 DIAGNOSIS — D62 Acute posthemorrhagic anemia: Secondary | ICD-10-CM | POA: Diagnosis not present

## 2015-08-10 DIAGNOSIS — K219 Gastro-esophageal reflux disease without esophagitis: Secondary | ICD-10-CM | POA: Diagnosis present

## 2015-08-10 DIAGNOSIS — I110 Hypertensive heart disease with heart failure: Secondary | ICD-10-CM | POA: Diagnosis present

## 2015-08-10 DIAGNOSIS — I509 Heart failure, unspecified: Secondary | ICD-10-CM | POA: Diagnosis present

## 2015-08-10 DIAGNOSIS — Z6841 Body Mass Index (BMI) 40.0 and over, adult: Secondary | ICD-10-CM | POA: Diagnosis not present

## 2015-08-10 DIAGNOSIS — G4733 Obstructive sleep apnea (adult) (pediatric): Secondary | ICD-10-CM | POA: Diagnosis present

## 2015-08-10 DIAGNOSIS — E039 Hypothyroidism, unspecified: Secondary | ICD-10-CM | POA: Diagnosis present

## 2015-08-10 DIAGNOSIS — I2510 Atherosclerotic heart disease of native coronary artery without angina pectoris: Secondary | ICD-10-CM | POA: Diagnosis present

## 2015-08-10 DIAGNOSIS — M1612 Unilateral primary osteoarthritis, left hip: Principal | ICD-10-CM | POA: Diagnosis present

## 2015-08-10 DIAGNOSIS — E1169 Type 2 diabetes mellitus with other specified complication: Secondary | ICD-10-CM | POA: Diagnosis present

## 2015-08-10 DIAGNOSIS — Z86718 Personal history of other venous thrombosis and embolism: Secondary | ICD-10-CM

## 2015-08-10 DIAGNOSIS — Z86711 Personal history of pulmonary embolism: Secondary | ICD-10-CM

## 2015-08-10 DIAGNOSIS — Z7984 Long term (current) use of oral hypoglycemic drugs: Secondary | ICD-10-CM

## 2015-08-10 DIAGNOSIS — Z419 Encounter for procedure for purposes other than remedying health state, unspecified: Secondary | ICD-10-CM

## 2015-08-10 DIAGNOSIS — Z87891 Personal history of nicotine dependence: Secondary | ICD-10-CM

## 2015-08-10 DIAGNOSIS — Z953 Presence of xenogenic heart valve: Secondary | ICD-10-CM

## 2015-08-10 DIAGNOSIS — E785 Hyperlipidemia, unspecified: Secondary | ICD-10-CM | POA: Diagnosis present

## 2015-08-10 DIAGNOSIS — Z7901 Long term (current) use of anticoagulants: Secondary | ICD-10-CM

## 2015-08-10 DIAGNOSIS — I10 Essential (primary) hypertension: Secondary | ICD-10-CM | POA: Diagnosis present

## 2015-08-10 DIAGNOSIS — Z96642 Presence of left artificial hip joint: Secondary | ICD-10-CM

## 2015-08-10 DIAGNOSIS — M1611 Unilateral primary osteoarthritis, right hip: Secondary | ICD-10-CM

## 2015-08-10 HISTORY — PX: TOTAL HIP ARTHROPLASTY: SHX124

## 2015-08-10 LAB — GLUCOSE, CAPILLARY
GLUCOSE-CAPILLARY: 111 mg/dL — AB (ref 65–99)
GLUCOSE-CAPILLARY: 120 mg/dL — AB (ref 65–99)
Glucose-Capillary: 108 mg/dL — ABNORMAL HIGH (ref 65–99)

## 2015-08-10 LAB — PROTIME-INR
INR: 1.14 (ref 0.00–1.49)
PROTHROMBIN TIME: 14.8 s (ref 11.6–15.2)

## 2015-08-10 SURGERY — ARTHROPLASTY, HIP, TOTAL, ANTERIOR APPROACH
Anesthesia: General | Laterality: Left

## 2015-08-10 MED ORDER — PHENYLEPHRINE HCL 10 MG/ML IJ SOLN
INTRAMUSCULAR | Status: DC | PRN
  Administered 2015-08-10 (×3): 80 ug via INTRAVENOUS

## 2015-08-10 MED ORDER — HYDROMORPHONE HCL 1 MG/ML IJ SOLN
INTRAMUSCULAR | Status: AC
  Administered 2015-08-10: 0.5 mg via INTRAVENOUS
  Filled 2015-08-10: qty 1

## 2015-08-10 MED ORDER — MIDAZOLAM HCL 2 MG/2ML IJ SOLN
INTRAMUSCULAR | Status: AC
  Filled 2015-08-10: qty 2

## 2015-08-10 MED ORDER — ACETAMINOPHEN 325 MG PO TABS
1000.0000 mg | ORAL_TABLET | Freq: Every day | ORAL | Status: DC
  Administered 2015-08-10 – 2015-08-13 (×4): 975 mg via ORAL
  Filled 2015-08-10 (×4): qty 3

## 2015-08-10 MED ORDER — OXYCODONE HCL 5 MG PO TABS
ORAL_TABLET | ORAL | Status: AC
  Filled 2015-08-10: qty 3

## 2015-08-10 MED ORDER — OXYCODONE HCL 5 MG PO TABS
5.0000 mg | ORAL_TABLET | ORAL | Status: DC | PRN
  Administered 2015-08-10: 15 mg via ORAL
  Administered 2015-08-10: 5 mg via ORAL
  Administered 2015-08-11 (×5): 10 mg via ORAL
  Administered 2015-08-12 – 2015-08-13 (×7): 15 mg via ORAL
  Filled 2015-08-10: qty 3
  Filled 2015-08-10: qty 2
  Filled 2015-08-10: qty 3
  Filled 2015-08-10: qty 2
  Filled 2015-08-10: qty 3
  Filled 2015-08-10: qty 2
  Filled 2015-08-10 (×3): qty 3
  Filled 2015-08-10: qty 2
  Filled 2015-08-10: qty 3
  Filled 2015-08-10: qty 2

## 2015-08-10 MED ORDER — LIDOCAINE HCL (CARDIAC) 20 MG/ML IV SOLN
INTRAVENOUS | Status: AC
  Filled 2015-08-10: qty 5

## 2015-08-10 MED ORDER — ONDANSETRON HCL 4 MG PO TABS: 4.0000 mg | ORAL_TABLET | Freq: Four times a day (QID) | ORAL | Status: DC | PRN

## 2015-08-10 MED ORDER — METOCLOPRAMIDE HCL 5 MG PO TABS: 5.0000 mg | ORAL_TABLET | Freq: Three times a day (TID) | ORAL | Status: DC | PRN

## 2015-08-10 MED ORDER — ROCURONIUM BROMIDE 50 MG/5ML IV SOLN
INTRAVENOUS | Status: AC
  Filled 2015-08-10: qty 2

## 2015-08-10 MED ORDER — INSULIN ASPART 100 UNIT/ML ~~LOC~~ SOLN: 0.0000 [IU] | Freq: Every day | SUBCUTANEOUS | Status: DC

## 2015-08-10 MED ORDER — ONDANSETRON HCL 4 MG/2ML IJ SOLN
INTRAMUSCULAR | Status: AC
  Filled 2015-08-10: qty 2

## 2015-08-10 MED ORDER — 0.9 % SODIUM CHLORIDE (POUR BTL) OPTIME
TOPICAL | Status: DC | PRN
  Administered 2015-08-10: 1000 mL

## 2015-08-10 MED ORDER — LEVOTHYROXINE SODIUM 112 MCG PO TABS
224.0000 ug | ORAL_TABLET | Freq: Every day | ORAL | Status: DC
  Administered 2015-08-11 – 2015-08-13 (×3): 224 ug via ORAL
  Filled 2015-08-10 (×3): qty 2

## 2015-08-10 MED ORDER — SUGAMMADEX SODIUM 500 MG/5ML IV SOLN
INTRAVENOUS | Status: AC
  Filled 2015-08-10: qty 5

## 2015-08-10 MED ORDER — OXYCODONE HCL 5 MG PO TABS
5.0000 mg | ORAL_TABLET | Freq: Once | ORAL | Status: DC
  Filled 2015-08-10: qty 1

## 2015-08-10 MED ORDER — DEXTROSE 5 % IV SOLN
3.0000 g | INTRAVENOUS | Status: AC
  Administered 2015-08-10: 3 g via INTRAVENOUS
  Filled 2015-08-10: qty 3000

## 2015-08-10 MED ORDER — SUGAMMADEX SODIUM 200 MG/2ML IV SOLN
INTRAVENOUS | Status: DC | PRN
  Administered 2015-08-10: 300 mg via INTRAVENOUS

## 2015-08-10 MED ORDER — METOCLOPRAMIDE HCL 5 MG/ML IJ SOLN: 5.0000 mg | Freq: Three times a day (TID) | INTRAMUSCULAR | Status: DC | PRN

## 2015-08-10 MED ORDER — ONDANSETRON HCL 4 MG/2ML IJ SOLN: 4.0000 mg | Freq: Four times a day (QID) | INTRAMUSCULAR | Status: DC | PRN

## 2015-08-10 MED ORDER — SODIUM CHLORIDE 0.9 % IV SOLN
INTRAVENOUS | Status: DC
  Administered 2015-08-10: 22:00:00 via INTRAVENOUS

## 2015-08-10 MED ORDER — PANTOPRAZOLE SODIUM 40 MG PO TBEC
40.0000 mg | DELAYED_RELEASE_TABLET | Freq: Every day | ORAL | Status: DC
  Administered 2015-08-10 – 2015-08-13 (×4): 40 mg via ORAL
  Filled 2015-08-10 (×4): qty 1

## 2015-08-10 MED ORDER — METFORMIN HCL 500 MG PO TABS
1000.0000 mg | ORAL_TABLET | Freq: Every day | ORAL | Status: DC
  Administered 2015-08-11 – 2015-08-12 (×2): 1000 mg via ORAL
  Filled 2015-08-10 (×2): qty 2

## 2015-08-10 MED ORDER — HYDROMORPHONE HCL 1 MG/ML IJ SOLN
0.2500 mg | INTRAMUSCULAR | Status: DC | PRN
  Administered 2015-08-10 (×4): 0.5 mg via INTRAVENOUS

## 2015-08-10 MED ORDER — METFORMIN HCL 500 MG PO TABS
500.0000 mg | ORAL_TABLET | Freq: Every day | ORAL | Status: DC
  Administered 2015-08-11 – 2015-08-13 (×3): 500 mg via ORAL
  Filled 2015-08-10 (×3): qty 1

## 2015-08-10 MED ORDER — ACETAMINOPHEN 325 MG PO TABS: 650.0000 mg | ORAL_TABLET | Freq: Four times a day (QID) | ORAL | Status: DC | PRN

## 2015-08-10 MED ORDER — MIDAZOLAM HCL 5 MG/5ML IJ SOLN
INTRAMUSCULAR | Status: DC | PRN
  Administered 2015-08-10: 2 mg via INTRAVENOUS

## 2015-08-10 MED ORDER — CEFAZOLIN SODIUM-DEXTROSE 2-3 GM-% IV SOLR
2.0000 g | Freq: Four times a day (QID) | INTRAVENOUS | Status: AC
  Administered 2015-08-10 – 2015-08-11 (×2): 2 g via INTRAVENOUS
  Filled 2015-08-10 (×2): qty 50

## 2015-08-10 MED ORDER — DEXTROSE 5 % IV SOLN
10.0000 mg | INTRAVENOUS | Status: DC | PRN
  Administered 2015-08-10: 40 ug/min via INTRAVENOUS

## 2015-08-10 MED ORDER — INSULIN ASPART 100 UNIT/ML ~~LOC~~ SOLN
0.0000 [IU] | Freq: Three times a day (TID) | SUBCUTANEOUS | Status: DC
  Administered 2015-08-11: 3 [IU] via SUBCUTANEOUS
  Administered 2015-08-11 (×2): 4 [IU] via SUBCUTANEOUS
  Administered 2015-08-12 – 2015-08-13 (×3): 3 [IU] via SUBCUTANEOUS

## 2015-08-10 MED ORDER — PROPOFOL 10 MG/ML IV BOLUS
INTRAVENOUS | Status: DC | PRN
  Administered 2015-08-10: 120 mg via INTRAVENOUS

## 2015-08-10 MED ORDER — FENTANYL CITRATE (PF) 250 MCG/5ML IJ SOLN
INTRAMUSCULAR | Status: DC | PRN
  Administered 2015-08-10 (×2): 50 ug via INTRAVENOUS
  Administered 2015-08-10: 100 ug via INTRAVENOUS

## 2015-08-10 MED ORDER — MENTHOL 3 MG MT LOZG
1.0000 | LOZENGE | OROMUCOSAL | Status: DC | PRN
Start: 2015-08-10 — End: 2015-08-13

## 2015-08-10 MED ORDER — VITAMIN D 1000 UNITS PO TABS
5000.0000 [IU] | ORAL_TABLET | Freq: Every day | ORAL | Status: DC
  Administered 2015-08-11 – 2015-08-13 (×3): 5000 [IU] via ORAL
  Filled 2015-08-10 (×3): qty 5

## 2015-08-10 MED ORDER — FUROSEMIDE 40 MG PO TABS
40.0000 mg | ORAL_TABLET | Freq: Every day | ORAL | Status: DC
  Administered 2015-08-11 – 2015-08-13 (×3): 40 mg via ORAL
  Filled 2015-08-10 (×3): qty 1

## 2015-08-10 MED ORDER — DIAZEPAM 5 MG PO TABS
5.0000 mg | ORAL_TABLET | Freq: Every day | ORAL | Status: DC
  Administered 2015-08-10 – 2015-08-12 (×3): 5 mg via ORAL
  Filled 2015-08-10 (×3): qty 1

## 2015-08-10 MED ORDER — ROCURONIUM BROMIDE 50 MG/5ML IV SOLN
INTRAVENOUS | Status: AC
  Filled 2015-08-10: qty 1

## 2015-08-10 MED ORDER — ACETAMINOPHEN 650 MG RE SUPP: 650.0000 mg | Freq: Four times a day (QID) | RECTAL | Status: DC | PRN

## 2015-08-10 MED ORDER — SIMVASTATIN 40 MG PO TABS
40.0000 mg | ORAL_TABLET | Freq: Every day | ORAL | Status: DC
  Administered 2015-08-10 – 2015-08-12 (×3): 40 mg via ORAL
  Filled 2015-08-10 (×3): qty 1

## 2015-08-10 MED ORDER — LACTATED RINGERS IV SOLN
INTRAVENOUS | Status: DC
  Administered 2015-08-10 (×3): via INTRAVENOUS

## 2015-08-10 MED ORDER — SUGAMMADEX SODIUM 200 MG/2ML IV SOLN
INTRAVENOUS | Status: AC
  Filled 2015-08-10: qty 2

## 2015-08-10 MED ORDER — PHENOL 1.4 % MT LIQD
1.0000 | OROMUCOSAL | Status: DC | PRN
Start: 2015-08-10 — End: 2015-08-13

## 2015-08-10 MED ORDER — SUCCINYLCHOLINE CHLORIDE 20 MG/ML IJ SOLN
INTRAMUSCULAR | Status: DC | PRN
Start: 2015-08-10 — End: 2015-08-10
  Administered 2015-08-10: 100 mg via INTRAVENOUS

## 2015-08-10 MED ORDER — FENTANYL CITRATE (PF) 250 MCG/5ML IJ SOLN
INTRAMUSCULAR | Status: AC
  Filled 2015-08-10: qty 5

## 2015-08-10 MED ORDER — METOPROLOL TARTRATE 25 MG PO TABS
25.0000 mg | ORAL_TABLET | Freq: Every day | ORAL | Status: DC
  Administered 2015-08-11 – 2015-08-13 (×3): 25 mg via ORAL
  Filled 2015-08-10 (×3): qty 1

## 2015-08-10 MED ORDER — METHOCARBAMOL 1000 MG/10ML IJ SOLN
500.0000 mg | Freq: Four times a day (QID) | INTRAMUSCULAR | Status: DC | PRN
  Filled 2015-08-10: qty 5

## 2015-08-10 MED ORDER — SODIUM CHLORIDE 0.9 % IR SOLN
Status: DC | PRN
  Administered 2015-08-10: 3000 mL

## 2015-08-10 MED ORDER — ONDANSETRON HCL 4 MG/2ML IJ SOLN
INTRAMUSCULAR | Status: DC | PRN
  Administered 2015-08-10: 4 mg via INTRAVENOUS

## 2015-08-10 MED ORDER — FLUOXETINE HCL 20 MG PO CAPS
40.0000 mg | ORAL_CAPSULE | Freq: Every day | ORAL | Status: DC
  Administered 2015-08-11 – 2015-08-13 (×3): 40 mg via ORAL
  Filled 2015-08-10 (×3): qty 2

## 2015-08-10 MED ORDER — ROCURONIUM BROMIDE 100 MG/10ML IV SOLN
INTRAVENOUS | Status: DC | PRN
  Administered 2015-08-10 (×2): 40 mg via INTRAVENOUS

## 2015-08-10 MED ORDER — DIPHENHYDRAMINE HCL 12.5 MG/5ML PO ELIX: 12.5000 mg | ORAL_SOLUTION | ORAL | Status: DC | PRN

## 2015-08-10 MED ORDER — ASPIRIN EC 325 MG PO TBEC
325.0000 mg | DELAYED_RELEASE_TABLET | Freq: Every day | ORAL | Status: DC
  Administered 2015-08-10 – 2015-08-13 (×4): 325 mg via ORAL
  Filled 2015-08-10 (×4): qty 1

## 2015-08-10 MED ORDER — PROMETHAZINE HCL 25 MG/ML IJ SOLN: 6.2500 mg | INTRAMUSCULAR | Status: DC | PRN

## 2015-08-10 MED ORDER — RIVAROXABAN 20 MG PO TABS
20.0000 mg | ORAL_TABLET | Freq: Every day | ORAL | Status: DC
  Administered 2015-08-11 – 2015-08-13 (×3): 20 mg via ORAL
  Filled 2015-08-10 (×3): qty 1

## 2015-08-10 MED ORDER — LIDOCAINE HCL (CARDIAC) 20 MG/ML IV SOLN
INTRAVENOUS | Status: DC | PRN
  Administered 2015-08-10: 80 mg via INTRAVENOUS

## 2015-08-10 MED ORDER — HYDROMORPHONE HCL 1 MG/ML IJ SOLN
1.0000 mg | INTRAMUSCULAR | Status: DC | PRN
  Administered 2015-08-11 (×2): 1 mg via INTRAVENOUS
  Filled 2015-08-10 (×2): qty 1

## 2015-08-10 MED ORDER — METHOCARBAMOL 500 MG PO TABS
500.0000 mg | ORAL_TABLET | Freq: Four times a day (QID) | ORAL | Status: DC | PRN
  Administered 2015-08-11 – 2015-08-13 (×7): 500 mg via ORAL
  Filled 2015-08-10 (×7): qty 1

## 2015-08-10 MED ORDER — DOCUSATE SODIUM 100 MG PO CAPS
100.0000 mg | ORAL_CAPSULE | Freq: Two times a day (BID) | ORAL | Status: DC
Start: 2015-08-10 — End: 2015-08-13
  Administered 2015-08-10 – 2015-08-13 (×6): 100 mg via ORAL
  Filled 2015-08-10 (×6): qty 1

## 2015-08-10 SURGICAL SUPPLY — 50 items
BENZOIN TINCTURE PRP APPL 2/3 (GAUZE/BANDAGES/DRESSINGS) IMPLANT
BLADE SAW SGTL 18X1.27X75 (BLADE) ×2 IMPLANT
BLADE SAW SGTL 18X1.27X75MM (BLADE) ×1
BLADE SURG ROTATE 9660 (MISCELLANEOUS) IMPLANT
CAPT HIP TOTAL 2 ×3 IMPLANT
CELLS DAT CNTRL 66122 CELL SVR (MISCELLANEOUS) ×1 IMPLANT
CLOSURE WOUND 1/2 X4 (GAUZE/BANDAGES/DRESSINGS) ×2
COVER SURGICAL LIGHT HANDLE (MISCELLANEOUS) ×3 IMPLANT
DRAPE C-ARM 42X72 X-RAY (DRAPES) ×3 IMPLANT
DRAPE STERI IOBAN 125X83 (DRAPES) ×3 IMPLANT
DRAPE U-SHAPE 47X51 STRL (DRAPES) ×9 IMPLANT
DRSG AQUACEL AG ADV 3.5X10 (GAUZE/BANDAGES/DRESSINGS) ×3 IMPLANT
DURAPREP 26ML APPLICATOR (WOUND CARE) ×3 IMPLANT
ELECT BLADE 4.0 EZ CLEAN MEGAD (MISCELLANEOUS) ×3
ELECT BLADE 6.5 EXT (BLADE) IMPLANT
ELECT REM PT RETURN 9FT ADLT (ELECTROSURGICAL) ×3
ELECTRODE BLDE 4.0 EZ CLN MEGD (MISCELLANEOUS) ×1 IMPLANT
ELECTRODE REM PT RTRN 9FT ADLT (ELECTROSURGICAL) ×1 IMPLANT
FACESHIELD WRAPAROUND (MASK) ×6 IMPLANT
GLOVE BIOGEL PI IND STRL 8 (GLOVE) ×2 IMPLANT
GLOVE BIOGEL PI INDICATOR 8 (GLOVE) ×4
GLOVE ECLIPSE 8.0 STRL XLNG CF (GLOVE) ×3 IMPLANT
GLOVE ORTHO TXT STRL SZ7.5 (GLOVE) ×6 IMPLANT
GOWN STRL REUS W/ TWL LRG LVL3 (GOWN DISPOSABLE) ×2 IMPLANT
GOWN STRL REUS W/ TWL XL LVL3 (GOWN DISPOSABLE) ×2 IMPLANT
GOWN STRL REUS W/TWL LRG LVL3 (GOWN DISPOSABLE) ×4
GOWN STRL REUS W/TWL XL LVL3 (GOWN DISPOSABLE) ×4
HANDPIECE INTERPULSE COAX TIP (DISPOSABLE) ×2
KIT BASIN OR (CUSTOM PROCEDURE TRAY) ×3 IMPLANT
KIT ROOM TURNOVER OR (KITS) ×3 IMPLANT
MANIFOLD NEPTUNE II (INSTRUMENTS) ×3 IMPLANT
NS IRRIG 1000ML POUR BTL (IV SOLUTION) ×3 IMPLANT
PACK TOTAL JOINT (CUSTOM PROCEDURE TRAY) ×3 IMPLANT
PAD ARMBOARD 7.5X6 YLW CONV (MISCELLANEOUS) ×3 IMPLANT
RTRCTR WOUND ALEXIS 18CM MED (MISCELLANEOUS) ×3
SET HNDPC FAN SPRY TIP SCT (DISPOSABLE) ×1 IMPLANT
STAPLER VISISTAT 35W (STAPLE) ×3 IMPLANT
STRIP CLOSURE SKIN 1/2X4 (GAUZE/BANDAGES/DRESSINGS) ×4 IMPLANT
SUT ETHIBOND NAB CT1 #1 30IN (SUTURE) ×3 IMPLANT
SUT MNCRL AB 4-0 PS2 18 (SUTURE) IMPLANT
SUT VIC AB 0 CT1 27 (SUTURE) ×2
SUT VIC AB 0 CT1 27XBRD ANBCTR (SUTURE) ×1 IMPLANT
SUT VIC AB 1 CT1 27 (SUTURE) ×4
SUT VIC AB 1 CT1 27XBRD ANBCTR (SUTURE) ×2 IMPLANT
SUT VIC AB 2-0 CT1 27 (SUTURE) ×4
SUT VIC AB 2-0 CT1 TAPERPNT 27 (SUTURE) ×2 IMPLANT
TOWEL OR 17X24 6PK STRL BLUE (TOWEL DISPOSABLE) ×3 IMPLANT
TOWEL OR 17X26 10 PK STRL BLUE (TOWEL DISPOSABLE) ×3 IMPLANT
TRAY FOLEY CATH 16FRSI W/METER (SET/KITS/TRAYS/PACK) ×3 IMPLANT
WATER STERILE IRR 1000ML POUR (IV SOLUTION) IMPLANT

## 2015-08-10 NOTE — Op Note (Signed)
NAME:  [name] [name], [name] [name] NO.:  [id number]  MEDICAL RECORD NO.:  [id number]  LOCATION:  [facility]                         FACILITY:  [facility]  PHYSICIAN:  Vanita Panda. 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  PROCEDURE:  Left total hip arthroplasty through direct anterior approach.  IMPLANTS:  DePuy Sector Gription acetabular component size 54, size 36+ 4 polyethylene liner, size 12 Corail femoral component with standard offset, size 36+ 5 ceramic hip ball.  SURGEON:  Vanita Panda. Magnus Ivan, MD  ASSISTANT:  Richardean Canal, PA-C  ANESTHESIA:  General.  ANTIBIOTICS:  3 g IV Ancef.  BLOOD LOSS:  450 mL.  COMPLICATIONS:  None.  INDICATIONS:  [name] [name] is a very pleasant 65 year old gentleman with congestive heart failure and multiple comorbidities who has been cleared for anterior hip surgery by Dr. Eden Emms, his cardiologist.  He is on for some days on chronic Xarelto and does have an IVC filter.  We held the Xarelto before the surgery.  His hip pain is debilitating, has detrimentally affected his activities of daily living, his quality of life, and his mobility.  At this point with the failure of all conservative treatment, efforts as well as his daily pain, his decreased mobility, and his poor quality of life, he wished to proceed with total hip arthroplasty.  He understands the risks of acute blood loss anemia, nerve and vessel injury, fracture, infection, dislocation, DVT.  He understands our goals are decreased pain, improved mobility, and overall improved quality of life.  Of note, he was starting out significantly shorter on his left side than the right side  due to his disease.  PROCEDURE DESCRIPTION:  After informed consent was obtained, appropriate left hip was marked.  He was brought to the operating room and general anesthesia was obtained while he was on a stretcher.  Foley catheter was placed and traction boots were placed on both his feet.  Next, he was placed supine on the Hana fracture table with the perineal post in place and both legs in inline skeletal traction devices, but no traction applied.  His left operative hip was prepped and draped with DuraPrep and sterile drapes.  Time-out was called to identify correct patient, correct left hip.  I then made an incision inferior and posterior to the anterior superior iliac spine and carried this obliquely down the leg. We dissected down the tensor fascia lata muscle.  The tensor fascia was then divided longitudinally, so we could proceed with a direct anterior approach to the hip.  We identified and cauterized the lateral femoral circumflex vessels and then identified the hip capsule.  We opened up the hip capsule in an L-type format placing the Cobra retractors within the hip capsule around the medial and lateral femoral neck.  We then made our femoral neck cut just proximal to the lesser trochanter and the calcar with an oscillating saw and completed this on osteotome and placed  a corkscrew guide in the femoral head and removed the femoral head in its entirety and found it to be completely devoid of cartilage. We then cleaned the acetabular remnants of acetabular labrum and released transverse acetabular ligament.  I placed a bent Hohmann over the medial acetabular rim.  I then began reaming from a size 42 reamer, all the way up to a size 54 with all reamers under direct visualization. The last reamer under direct fluoroscopy, so I could obtain my depth of reaming, our inclination, and anteversion.  Once I was pleased with this, I placed the real DePuy Sector Gription acetabular  component size 54, and a 36+ 4 polyethylene liner.  Attention was then turned to the femur.  With the leg externally rotated to 110 degrees extended and adducted, I was able to place a Mueller retractor medially and a Hohmann retractor behind the greater trochanter.  I released the lateral joint capsule and used a box cutting osteotome to enter femoral canal and a rongeur to lateralize.  I then began broaching from a size 8 broach using the Corail broaching system up to a size 12.  With a size 12 in place, we trialed a standard offset femoral neck and a 36+ 1.5 hip ball. We rolled the leg back over and up with traction and internal rotation, reducing the pelvis.  It was very tight and I felt like we did at least go a little bit longer leg length.  We dislocated the hip and removed the trial components.  I then placed the real Corail femoral component size 12 with standard offset and the real 36+ 5 ceramic hip ball, reducing this in the acetabulum and again, it was stable.  We then copiously irrigated the soft tissue with normal saline solution using pulsatile lavage.  We were able to close the joint capsule with interrupted #1 Ethibond suture followed by running #1 Vicryl in the tensor fascia, 0 Vicryl in the deep tissue, 2-0 Vicryl subcutaneous tissue, interrupted staples on the skin.  Xeroform and Aquacel dressing was applied and then he was taken off the Hana table, awakened, extubated and taken to recovery room in stable condition.  All final counts were correct.  There were no complications noted.  Of note, Richardean Canal, PA-C assisted in the entire case.  His assistance was crucial for facilitating all aspects of this case.     Vanita Panda. Magnus Ivan, M.D.     CYB/MEDQ  D:  08/10/2015  T:  08/10/2015  Job:  [id number]

## 2015-08-10 NOTE — Anesthesia Preprocedure Evaluation (Addendum)
Anesthesia Evaluation  Patient identified by MRN, date of birth, ID band Patient awake    Reviewed: Allergy & Precautions, NPO status , Patient's Chart, lab work & pertinent test results  History of Anesthesia Complications Negative for: history of anesthetic complications  Airway Mallampati: II  TM Distance: >3 FB Neck ROM: Full    Dental  (+) Teeth Intact, Dental Advisory Given, Missing   Pulmonary sleep apnea and Continuous Positive Airway Pressure Ventilation , former smoker,    Pulmonary exam normal        Cardiovascular hypertension, + Peripheral Vascular Disease and +CHF  Normal cardiovascular exam  Echo 08/26/13:  - Left ventricle: The cavity size was normal. Wall thickness was increased in a pattern of mild LVH. Systolic function was normal. The estimated ejection fraction was in the range of 60% to 65%. Wall motion was normal; there were no regional wall motion abnormalities. Features are consistent with a pseudonormal left ventricular filling pattern, with concomitant abnormal relaxation and increased filling pressure (grade 2 diastolic dysfunction). - Aortic valve: A 25mm bioprosthesis was present. Overall normal function. No significant regurgitation. No perivalvular leak. Mean gradient: 12mm Hg (S). LVOT/AV VTI ratio 0.68. - Mitral valve: Moderately thickened leaflets. A 28mm annular ring prosthesis was present. No significant regurgitation. Pressure half-time: 94ms. Mean gradient: 11mm Hg (D). Valve area by pressure half-time: 2.34cm^2. Valve area by continuity equation (using LVOT flow): 1.44cm^2. Some/what discordant measurements - pressure half-time suggests normal valve area, although gradients and continuity measures indicate potentially mild to moderate stenotic flow. Leaflet motion is not well seen. - Left atrium: The atrium was severely dilated. - Right ventricle: Poorly visualized. Systolic function was normal. -  Right atrium: The atrium was mildly dilated. - Tricuspid valve: Trivial regurgitation. Peak RV-RA gradient: 31mm Hg (S). - Inferior vena cava: Not visualized. Unable to estimate CVP. - Pericardium, extracardiac: There was no pericardial effusion. Impressions: - Mild LVH with LVEF 60-65%, grade 2 diastolic dysfunction.Severe left atrial enlargement. Mitral annuloplasty with thickened leaflets, no mitral regurgitation. Somewhat discordant area information - cannot exclude mild tomoderate stenotic transmitral flow. Normal bioprosthetic aorticvalve function. Trivial triscupid regurgitation withRV-RA gradient 31 mmHg. Mild right atrial enlargement.   Neuro/Psych PSYCHIATRIC DISORDERS Depression negative neurological ROS     GI/Hepatic GERD  ,  Endo/Other  diabetesHypothyroidism Morbid obesity  Renal/GU      Musculoskeletal   Abdominal   Peds  Hematology   Anesthesia Other Findings   Reproductive/Obstetrics                           Anesthesia Physical Anesthesia Plan  ASA: III  Anesthesia Plan: General   Post-op Pain Management:    Induction: Intravenous  Airway Management Planned: Oral ETT  Additional Equipment:   Intra-op Plan:   Post-operative Plan: Extubation in OR  Informed Consent: I have reviewed the patients History and Physical, chart, labs and discussed the procedure including the risks, benefits and alternatives for the proposed anesthesia with the patient or authorized representative who has indicated his/her understanding and acceptance.   Dental advisory given  Plan Discussed with:   Anesthesia Plan Comments:        Anesthesia Quick Evaluation

## 2015-08-10 NOTE — Transfer of Care (Signed)
Immediate Anesthesia Transfer of Care Note  Patient: [name] [name]  Procedure(s) Performed: Procedure(s): LEFT TOTAL HIP ARTHROPLASTY ANTERIOR APPROACH (Left)  Patient Location: PACU  Anesthesia Type:General  Level of Consciousness: awake, alert  and oriented  Airway & Oxygen Therapy: Patient connected to face mask oxygen  Post-op Assessment: Report given to RN  Post vital signs: stable  Last Vitals:  Filed Vitals:   08/10/15 1259  BP: 136/71  Pulse: 77  Temp: 37.3 C  Resp: 20    Complications: No apparent anesthesia complications

## 2015-08-10 NOTE — H&P (Signed)
TOTAL HIP ADMISSION H&P  Patient is admitted for left total hip arthroplasty.  Subjective:  Chief Complaint: left hip pain  HPI: [name] [name], 65 y.o. male, has a history of pain and functional disability in the left hip(s) due to arthritis and patient has failed non-surgical conservative treatments for greater than 12 weeks to include corticosteriod injections, flexibility and strengthening excercises, use of assistive devices, weight reduction as appropriate and activity modification.  Onset of symptoms was gradual starting 2 years ago with gradually worsening course since that time.The patient noted no past surgery on the left hip(s).  Patient currently rates pain in the left hip at 10 out of 10 with activity. Patient has night pain, worsening of pain with activity and weight bearing, trendelenberg gait, pain that interfers with activities of daily living and pain with passive range of motion. Patient has evidence of subchondral sclerosis, periarticular osteophytes and joint space narrowing by imaging studies. This condition presents safety issues increasing the risk of falls.  There is no current active infection.  Patient Active Problem List   Diagnosis Date Noted  . Osteoarthritis of right hip 08/10/2015  . Uncontrolled type 2 diabetes mellitus (HCC) 04/28/2015  . Primary hypothyroidism 04/28/2015  . Hyperlipidemia 04/28/2015  . Essential hypertension, benign 04/28/2015  . 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 Varicose vein 01/24/2012  . RLS (restless legs syndrome) 07/11/2011  . Seroma 07/07/2011  . Deep venous thrombosis (HCC)   . History of endocarditis   . Coronary artery disease (CAD) excluded   . Extrasystole 04/30/2011  . S/P AVR (aortic valve replacement) 04/10/2011  . S/P mitral valve repair 04/10/2011  . Mitral regurgitation 03/29/2011  . Aortic insufficiency   . Dyspnea 11/15/2010  . ENDOCARDITIS 03/20/2010  . SHORTNESS OF BREATH  07/21/2009  . OVERWEIGHT/OBESITY 02/20/2009  . DEPRESSION 02/20/2009  . Obstructive sleep apnea 02/20/2009  . Essential hypertension 02/20/2009  . SKIN LESION 02/20/2009  . CHEST PAIN-UNSPECIFIED 02/20/2009  . TRAUMATIC COMPARTMENT SYNDROME LOWER EXTREMITY 02/20/2009   Past Medical History  Diagnosis Date  . Obesity   . Hypertension   . Depression   . 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 Diabetes mellitus   . Hyperlipidemia   . Coronary artery disease (CAD) excluded 2008, 2011    Mild coronary plaque.  w/ normal LV function. repeated cardiac cath on 07/2009  . 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 History of endocarditis     status post Enterococcus faecalis, secondary to chronic injections with low molecular weight heparin  . Deep venous thrombosis (HCC)     recurrent DVT and PE, status post IVC filter  . OSA (obstructive sleep apnea)     CPAP  . CHF (congestive heart failure) (HCC)   . Dyspnea     with exertion  . GERD (gastroesophageal reflux disease)   . Arthritis   . Restless leg syndrome     Past Surgical History  Procedure Laterality Date  . Thyroidectomy    . Mitral valve repair  04/10/2011    autologous pericardial patch augmentation of posterior leaflet with 28mm Sorin Memo 3D ring annuloplasty  . Aortic valve replacement  04/10/2011    25mm [facility] Ease pericardial bioprosthetic aortic valve  . Cardiac catheterization  2008  . Cardiac catheterization  07/2009    minor  irregulreties without obstructive disease  . Colonoscopy w/ polypectomy    . Ivc filter placement ([facility] hx)      No prescriptions prior to admission   No Known Allergies  Social History  Substance Use Topics  . Smoking status: Former Smoker -- 1.00 packs/day for 6 years    Types: Cigarettes    Quit date: 01/22/1974  . Smokeless  tobacco: Never Used  . Alcohol Use: No    Family History  Problem Relation Age of Onset  . Hypertension Father   . Hyperlipidemia Father   . CAD Father   . Diabetes Mother   . Hypertension Mother   . Cancer Mother   . CAD Mother   . Coronary artery disease Mother   . Stroke Mother   . Kidney disease Mother   . Hyperlipidemia Mother   . Diabetes Maternal Grandmother   . Diabetes Maternal Grandfather   . Diabetes Paternal Grandmother   . Diabetes Paternal Grandfather      Review of Systems  Musculoskeletal: Positive for joint pain.  All other systems reviewed and are negative.   Objective:  Physical Exam  Constitutional: He is oriented to person, place, and time. He appears well-developed and well-nourished.  HENT:  Head: Normocephalic and atraumatic.  Eyes: EOM are normal. Pupils are equal, round, and reactive to light.  Neck: Normal range of motion. Neck supple.  Cardiovascular: Normal rate and regular rhythm.   Respiratory: Effort normal and breath sounds normal.  GI: Soft. Bowel sounds are normal.  Musculoskeletal:       Left hip: He exhibits decreased range of motion, decreased strength, tenderness and bony tenderness.  Neurological: He is alert and oriented to person, place, and time.  Skin: Skin is warm and dry.  Psychiatric: He has a normal mood and affect.    Vital signs in last 24 hours:    Labs:   Estimated body mass index is 43.97 kg/(m^2) as calculated from the following:   Height as of 07/14/15:  (1.803 m).   Weight as of 07/14/15: 142.937 kg (315 lb 1.9 oz).   Imaging Review Plain radiographs demonstrate severe degenerative joint disease of the left hip(s). The bone quality appears to be good for age and reported activity level.  Assessment/Plan:  End stage arthritis, left hip(s)  The patient history, physical examination, clinical judgement of the provider and imaging studies are consistent with end stage degenerative joint disease of  the left hip(s) and total hip arthroplasty is deemed medically necessary. The treatment options including medical management, injection therapy, arthroscopy and arthroplasty were discussed at length. The risks and benefits of total hip arthroplasty were presented and reviewed. The risks due to aseptic loosening, infection, stiffness, dislocation/subluxation,  thromboembolic complications and other imponderables were discussed.  The patient acknowledged the explanation, agreed to proceed with the plan and consent was signed. Patient is being admitted for inpatient treatment for surgery, pain control, PT, OT, prophylactic antibiotics, VTE prophylaxis, progressive ambulation and ADL's and discharge planning.The patient is planning to be discharged home with home health services

## 2015-08-10 NOTE — Anesthesia Procedure Notes (Signed)
Procedure Name: Intubation Date/Time: 08/10/2015 3:25 PM Performed by: Reine Just Pre-anesthesia Checklist: Patient identified, Emergency Drugs available, Suction available, Patient being monitored and Timeout performed Patient Re-evaluated:Patient Re-evaluated prior to inductionOxygen Delivery Method: Circle system utilized and Simple face mask Preoxygenation: Pre-oxygenation with 100% oxygen Intubation Type: Combination inhalational/ intravenous induction Ventilation: Mask ventilation without difficulty and Oral airway inserted - appropriate to patient size Laryngoscope Size: Miller and 3 Grade View: Grade I Tube type: Oral Tube size: 7.5 mm Number of attempts: 1 Airway Equipment and Method: Patient positioned with wedge pillow and Stylet Placement Confirmation: ETT inserted through vocal cords under direct vision,  positive ETCO2 and breath sounds checked- equal and bilateral Secured at: 22 cm Tube secured with: Tape Dental Injury: Teeth and Oropharynx as per pre-operative assessment

## 2015-08-10 NOTE — Brief Op Note (Signed)
08/10/2015  4:56 PM  PATIENT:  [name] [name]  66 y.o. male  PRE-OPERATIVE DIAGNOSIS:  Severe osteoarthritis left hip  POST-OPERATIVE DIAGNOSIS:  same   PROCEDURE:  Procedure(s): LEFT TOTAL HIP ARTHROPLASTY ANTERIOR APPROACH (Left)  SURGEON:  Surgeon(s) and Role:    * Kathryne Hitch, MD - Primary  PHYSICIAN ASSISTANT: Rexene Edison, PA-C  ANESTHESIA:   general  EBL:  Total I/O In: 1000 [I.V.:1000] Out: 465 [Urine:65; Blood:400]  COUNTS:  YES  DICTATION: .Other Dictation: Dictation Number [id number]  PLAN OF CARE: Admit to inpatient   PATIENT DISPOSITION:  PACU - hemodynamically stable.   Delay start of Pharmacological VTE agent (>24hrs) due to surgical blood loss or risk of bleeding: no

## 2015-08-10 NOTE — Anesthesia Postprocedure Evaluation (Signed)
Anesthesia Post Note  Patient: TODDY BOYD  Procedure(s) Performed: Procedure(s) (LRB): LEFT TOTAL HIP ARTHROPLASTY ANTERIOR APPROACH (Left)  Patient location during evaluation: PACU Anesthesia Type: General Level of consciousness: awake and alert Pain management: pain level controlled Vital Signs Assessment: post-procedure vital signs reviewed and stable Respiratory status: spontaneous breathing, nonlabored ventilation, respiratory function stable and patient connected to nasal cannula oxygen Cardiovascular status: blood pressure returned to baseline and stable Postop Assessment: no signs of nausea or vomiting Anesthetic complications: no    Last Vitals:  Filed Vitals:   08/10/15 1259 08/10/15 1723  BP: 136/71 136/80  Pulse: 77   Temp: 37.3 C 36.7 C  Resp: 20 16    Last Pain:  Filed Vitals:   08/10/15 1729  PainSc: 10-Worst pain ever                 Reino Kent

## 2015-08-11 LAB — BASIC METABOLIC PANEL
Anion gap: 7 (ref 5–15)
BUN: 15 mg/dL (ref 6–20)
CHLORIDE: 103 mmol/L (ref 101–111)
CO2: 26 mmol/L (ref 22–32)
CREATININE: 1.1 mg/dL (ref 0.61–1.24)
Calcium: 8.5 mg/dL — ABNORMAL LOW (ref 8.9–10.3)
GFR calc Af Amer: >60 mL/min (ref >60)
GFR calc non Af Amer: >60 mL/min (ref >60)
Glucose, Bld: 184 mg/dL — ABNORMAL HIGH (ref 65–99)
Potassium: 4.1 mmol/L (ref 3.5–5.1)
Sodium: 136 mmol/L (ref 135–145)

## 2015-08-11 LAB — CBC
HEMATOCRIT: 30.2 % — AB (ref 39.0–52.0)
HEMOGLOBIN: 8.7 g/dL — AB (ref 13.0–17.0)
MCH: 21.8 pg — AB (ref 26.0–34.0)
MCHC: 28.8 g/dL — AB (ref 30.0–36.0)
MCV: 75.7 fL — ABNORMAL LOW (ref 78.0–100.0)
Platelets: 199 10*3/uL (ref 150–400)
RBC: 3.99 MIL/uL — ABNORMAL LOW (ref 4.22–5.81)
RDW: 16.8 % — AB (ref 11.5–15.5)
WBC: 6.9 10*3/uL (ref 4.0–10.5)

## 2015-08-11 LAB — GLUCOSE, CAPILLARY
GLUCOSE-CAPILLARY: 191 mg/dL — AB (ref 65–99)
GLUCOSE-CAPILLARY: 146 mg/dL — AB (ref 65–99)
GLUCOSE-CAPILLARY: 147 mg/dL — AB (ref 65–99)
Glucose-Capillary: 151 mg/dL — ABNORMAL HIGH (ref 65–99)
Glucose-Capillary: 146 mg/dL — ABNORMAL HIGH (ref 65–99)

## 2015-08-11 NOTE — Progress Notes (Signed)
Utilization review completed.  

## 2015-08-11 NOTE — Discharge Instructions (Addendum)
Information on my medicine - XARELTO (rivaroxaban)  This medication education was reviewed with me or my healthcare representative as part of my discharge preparation.  The pharmacist that spoke with me during my hospital stay was:  Carney, Gwenlyn Found, [facility]  WHY WAS XARELTO PRESCRIBED FOR YOU? Xarelto was prescribed to treat blood clots that may have been found in the veins of your legs (deep vein thrombosis) or in your lungs (pulmonary embolism) and to reduce the risk of them occurring again.  What do you need to know about Xarelto? The dose is one 20 mg tablet taken ONCE A DAY with your evening meal.  DO NOT stop taking Xarelto without talking to the health care provider who prescribed the medication.  Refill your prescription for 20 mg tablets before you run out.  After discharge, you should have regular check-up appointments with your healthcare provider that is prescribing your Xarelto.  In the future your dose may need to be changed if your kidney function changes by a significant amount.  What do you do if you miss a dose? If you are taking Xarelto TWICE DAILY and you miss a dose, take it as soon as you remember. You may take two 15 mg tablets (total 30 mg) at the same time then resume your regularly scheduled 15 mg twice daily the next day.  If you are taking Xarelto ONCE DAILY and you miss a dose, take it as soon as you remember on the same day then continue your regularly scheduled once daily regimen the next day. Do not take two doses of Xarelto at the same time.   Important Safety Information Xarelto is a blood thinner medicine that can cause bleeding. You should call your healthcare provider right away if you experience any of the following: ? Bleeding from an injury or your nose that does not stop. ? Unusual colored urine (red or dark brown) or unusual colored stools (red or black). ? Unusual bruising for unknown reasons. ? A serious fall or if you hit your head (even if  there is no bleeding).  Some medicines may interact with Xarelto and might increase your risk of bleeding while on Xarelto. To help avoid this, consult your healthcare provider or pharmacist prior to using any new prescription or non-prescription medications, including herbals, vitamins, non-steroidal anti-inflammatory drugs (NSAIDs) and supplements.  This website has more information on Xarelto: VisitDestination.com.br.  INSTRUCTIONS AFTER JOINT REPLACEMENT   o Remove items at home which could result in a fall. This includes throw rugs or furniture in walking pathways o ICE to the affected joint every three hours while awake for 30 minutes at a time, for at least the first 3-5 days, and then as needed for pain and swelling.  Continue to use ice for pain and swelling. You may notice swelling that will progress down to the foot and ankle.  This is normal after surgery.  Elevate your leg when you are not up walking on it.   o Continue to use the breathing machine you got in the hospital (incentive spirometer) which will help keep your temperature down.  It is common for your temperature to cycle up and down following surgery, especially at night when you are not up moving around and exerting yourself.  The breathing machine keeps your lungs expanded and your temperature down.   DIET:  As you were doing prior to hospitalization, we recommend a well-balanced diet.  DRESSING / WOUND CARE / SHOWERING  Keep the surgical dressing  until follow up.  The dressing is water proof, so you can shower without any extra covering.  IF THE DRESSING FALLS OFF or the wound gets wet inside, change the dressing with sterile gauze.  Please use good hand washing techniques before changing the dressing.  Do not use any lotions or creams on the incision until instructed by your surgeon.    ACTIVITY  o Increase activity slowly as tolerated, but follow the weight bearing instructions below.   o No driving for 6 weeks or until  further direction given by your physician.  You cannot drive while taking narcotics.  o No lifting or carrying greater than 10 lbs. until further directed by your surgeon. o Avoid periods of inactivity such as sitting longer than an hour when not asleep. This helps prevent blood clots.  o You may return to work once you are authorized by your doctor.     WEIGHT BEARING   Weight bearing as tolerated with assist device (walker, cane, etc) as directed, use it as long as suggested by your surgeon or therapist, typically at least 4-6 weeks.   EXERCISES  Results after joint replacement surgery are often greatly improved when you follow the exercise, range of motion and muscle strengthening exercises prescribed by your doctor. Safety measures are also important to protect the joint from further injury. Any time any of these exercises cause you to have increased pain or swelling, decrease what you are doing until you are comfortable again and then slowly increase them. If you have problems or questions, call your caregiver or physical therapist for advice.   Rehabilitation is important following a joint replacement. After just a few days of immobilization, the muscles of the leg can become weakened and shrink (atrophy).  These exercises are designed to build up the tone and strength of the thigh and leg muscles and to improve motion. Often times heat used for twenty to thirty minutes before working out will loosen up your tissues and help with improving the range of motion but do not use heat for the first two weeks following surgery (sometimes heat can increase post-operative swelling).   These exercises can be done on a training (exercise) mat, on the floor, on a table or on a bed. Use whatever works the best and is most comfortable for you.    Use music or television while you are exercising so that the exercises are a pleasant break in your day. This will make your life better with the exercises acting  as a break in your routine that you can look forward to.   Perform all exercises about fifteen times, three times per day or as directed.  You should exercise both the operative leg and the other leg as well.  Exercises include:    Quad Sets - Tighten up the muscle on the front of the thigh (Quad) and hold for 5-10 seconds.    Straight Leg Raises - With your knee straight (if you were given a brace, keep it on), lift the leg to 60 degrees, hold for 3 seconds, and slowly lower the leg.  Perform this exercise against resistance later as your leg gets stronger.   Leg Slides: Lying on your back, slowly slide your foot toward your buttocks, bending your knee up off the floor (only go as far as is comfortable). Then slowly slide your foot back down until your leg is flat on the floor again.   Angel Wings: Lying on your back spread your  legs to the side as far apart as you can without causing discomfort.   Hamstring Strength:  Lying on your back, push your heel against the floor with your leg straight by tightening up the muscles of your buttocks.  Repeat, but this time bend your knee to a comfortable angle, and push your heel against the floor.  You may put a pillow under the heel to make it more comfortable if necessary.   A rehabilitation program following joint replacement surgery can speed recovery and prevent re-injury in the future due to weakened muscles. Contact your doctor or a physical therapist for more information on knee rehabilitation.    CONSTIPATION  Constipation is defined medically as fewer than three stools per week and severe constipation as less than one stool per week.  Even if you have a regular bowel pattern at home, your normal regimen is likely to be disrupted due to multiple reasons following surgery.  Combination of anesthesia, postoperative narcotics, change in appetite and fluid intake all can affect your bowels.   YOU MUST use at least one of the following options; they  are listed in order of increasing strength to get the job done.  They are all available over the counter, and you may need to use some, POSSIBLY even all of these options:    Drink plenty of fluids (prune juice may be helpful) and high fiber foods Colace 100 mg by mouth twice a day  Senokot for constipation as directed and as needed Dulcolax (bisacodyl), take with full glass of water  Miralax (polyethylene glycol) once or twice a day as needed.  If you have tried all these things and are unable to have a bowel movement in the first 3-4 days after surgery call either your surgeon or your primary doctor.    If you experience loose stools or diarrhea, hold the medications until you stool forms back up.  If your symptoms do not get better within 1 week or if they get worse, check with your doctor.  If you experience "the worst abdominal pain ever" or develop nausea or vomiting, please contact the office immediately for further recommendations for treatment.   ITCHING:  If you experience itching with your medications, try taking only a single pain pill, or even half a pain pill at a time.  You can also use Benadryl over the counter for itching or also to help with sleep.   TED HOSE STOCKINGS:  Use stockings on both legs until for at least 2 weeks or as directed by physician office. They may be removed at night for sleeping.  MEDICATIONS:  See your medication summary on the After Visit Summary that nursing will review with you.  You may have some home medications which will be placed on hold until you complete the course of blood thinner medication.  It is important for you to complete the blood thinner medication as prescribed.  PRECAUTIONS:  If you experience chest pain or shortness of breath - call 911 immediately for transfer to the hospital emergency department.   If you develop a fever greater that 101 F, purulent drainage from wound, increased redness or drainage from wound, foul odor from the  wound/dressing, or calf pain - CONTACT YOUR SURGEON.  FOLLOW-UP APPOINTMENTS:  If you do not already have a post-op appointment, please call the office for an appointment to be seen by your surgeon.  Guidelines for how soon to be seen are listed in your “After Visit Summary”, but are typically between 1-4 weeks after surgery. ° °OTHER INSTRUCTIONS:  ° °Knee Replacement:  Do not place pillow under knee, focus on keeping the knee straight while resting. CPM instructions: 0-90 degrees, 2 hours in the morning, 2 hours in the afternoon, and 2 hours in the evening. Place foam block, curve side up under heel at all times except when in CPM or when walking.  DO NOT modify, tear, cut, or change the foam block in any way. ° °MAKE SURE YOU:  °• Understand these instructions.  °• Get help right away if you are not doing well or get worse.  ° ° °Thank you for letting us be a part of your medical care team.  It is a privilege we respect greatly.  We hope these instructions will help you stay on track for a fast and full recovery!  ° °

## 2015-08-11 NOTE — Progress Notes (Signed)
Physical Therapy Treatment Patient Details Name: [name] [name] MRN: [id number] DOB: 1950-07-06 Today's Date: 09-01-15    History of Present Illness Pt admitted for left THA anterior approach. PMHx: DM, HTN, restless leg, CAD, SOB, mitral valve replacement    PT Comments    Pt with progression of gait distance and HEp with report of increased ease with movement for PM session. Pt educated for transfers, HEP and progression. Encouraged increased mobility with nursing assist. Will continue to follow with plan for stairs next date.   Follow Up Recommendations  Home health PT     Equipment Recommendations       Recommendations for Other Services       Precautions / Restrictions Precautions Precautions: Fall Restrictions LLE Weight Bearing: Weight bearing as tolerated    Mobility  Bed Mobility               General bed mobility comments: EOB on arrival  Transfers     Transfers: Sit to/from Stand Sit to Stand: Supervision         General transfer comment: cues for hand placement  Ambulation/Gait Ambulation/Gait assistance: Supervision Ambulation Distance (Feet): 110 Feet Assistive device: Rolling walker (2 wheeled) Gait Pattern/deviations: Step-through pattern;Decreased stride length;Wide base of support;Trunk flexed   Gait velocity interpretation: Below normal speed for age/gender General Gait Details: cues for posture, position in RW, looking up and step through pattern   Stairs            Wheelchair Mobility    Modified Rankin (Stroke Patients Only)       Balance                                    Cognition Arousal/Alertness: Awake/alert Behavior During Therapy: WFL for tasks assessed/performed Overall Cognitive Status: Within Functional Limits for tasks assessed                      Exercises Total Joint Exercises Hip ABduction/ADduction: Left;10 reps;Standing;AROM Knee Flexion: AROM;Left;10  reps;Standing Marching in Standing: AROM;Left;10 reps;Standing Standing Hip Extension: AROM;Left;10 reps;Standing    General Comments        Pertinent Vitals/Pain Pain Score: 5  Pain Location: left hip Pain Descriptors / Indicators: Aching Pain Intervention(s): Limited activity within patient's tolerance;Monitored during session;Premedicated before session;Repositioned    Home Living                      Prior Function            PT Goals (current goals can now be found in the care plan section) Progress towards PT goals: Progressing toward goals    Frequency       PT Plan Current plan remains appropriate    Co-evaluation             End of Session   Activity Tolerance: Patient tolerated treatment well Patient left: in chair;with call bell/phone within reach     Time: 1200-1220 PT Time Calculation (min) (ACUTE ONLY): 20 min  Charges:  $Gait Training: 8-22 mins                    G Codes:      Delorse Lek 2015/09/01, 12:34 PM Delaney Meigs, PT [phone]

## 2015-08-11 NOTE — Care Management Note (Signed)
Case Management Note  Patient Details  Name: SHERWIN HOLLINGSHED MRN: [id number] Date of Birth: Apr 07, 1951  Subjective/Objective:        S/p left total knee arthroplasty             Action/Plan: Spoke with patient about discharge plan. He selected [facility] which he worked with in the past. Contacted Tiffany at [employer] and set up HHPT. Patient stated that he has a rolling walker and 3N1 at home and his wife will be able to assist him after discharge. No other discharge needs identified.  Expected Discharge Date:                  Expected Discharge Plan:  Home w Home Health Services  In-House Referral:  NA  Discharge planning Services  CM Consult  Post Acute Care Choice:  Home Health Choice offered to:  Patient  DME Arranged:  N/A DME Agency:  NA  HH Arranged:  PT HH Agency:  [facility]  Status of Service:  Completed, signed off  Medicare Important Message Given:    Date Medicare IM Given:    Medicare IM give by:    Date Additional Medicare IM Given:    Additional Medicare Important Message give by:     If discussed at Long Length of Stay Meetings, dates discussed:    Additional Comments:  Monica Becton, RN 08/11/2015, 10:58 AM

## 2015-08-11 NOTE — Progress Notes (Signed)
Subjective: 1 Day Post-Op Procedure(s) (LRB): LEFT TOTAL HIP ARTHROPLASTY ANTERIOR APPROACH (Left) Patient reports pain as moderate.  Acute blood loss anemia from surgery, but tolerating well.  Objective: Vital signs in last 24 hours: Temp:  [98 F (36.7 C)-99.2 F (37.3 C)] 98.6 F (37 C) (02/17 0513) Pulse Rate:  [73-99] 91 (02/17 0513) Resp:  [13-20] 16 (02/17 0513) BP: (119-144)/(67-94) 127/67 mmHg (02/17 0513) SpO2:  [94 %-98 %] 95 % (02/17 0513) Weight:  [143.337 kg (316 lb)] 143.337 kg (316 lb) (02/16 1259)  Intake/Output from previous day: 02/16 0701 - 02/17 0700 In: 1000 [I.V.:1000] Out: 1465 [Urine:1065; Blood:400] Intake/Output this shift: Total I/O In: -  Out: 700 [Urine:700]   Recent Labs  08/11/15 0559  HGB 8.7*    Recent Labs  08/11/15 0559  WBC 6.9  RBC 3.99*  HCT 30.2*  PLT 199    Recent Labs  08/11/15 0559  NA 136  K 4.1  CL 103  CO2 26  BUN 15  CREATININE 1.10  GLUCOSE 184*  CALCIUM 8.5*    Recent Labs  08/10/15 1307  INR 1.14    Sensation intact distally Intact pulses distally Dorsiflexion/Plantar flexion intact Incision: dressing C/D/I  Assessment/Plan: 1 Day Post-Op Procedure(s) (LRB): LEFT TOTAL HIP ARTHROPLASTY ANTERIOR APPROACH (Left) Up with therapy  Monitor H/H closely - low thresh-hold for transfusion given cardia history  BLACKMAN,CHRISTOPHER Y 08/11/2015, 6:52 AM

## 2015-08-11 NOTE — Evaluation (Signed)
Physical Therapy Evaluation Patient Details Name: [name] [name] MRN: [id number] DOB: [birth year] Today's Date: 08/11/2015   History of Present Illness  Pt admitted for left THA anterior approach. PMHx: DM, HTN, restless leg, CAD, SOB, mitral valve replacement  Clinical Impression  Pt very pleasant with limited hip ROM. Pt with decreased strength, transfers, gait, functional mobility and independence who will benefit from acute therapy to maximize function and independence to decrease burden of care. Pt educated for transfers, gait and HEP with handout provided.     Follow Up Recommendations Home health PT    Equipment Recommendations  None recommended by PT    Recommendations for Other Services       Precautions / Restrictions Precautions Precautions: Fall Restrictions Weight Bearing Restrictions: Yes LLE Weight Bearing: Weight bearing as tolerated      Mobility  Bed Mobility Overal bed mobility: Modified Independent             General bed mobility comments: with rail and increased time  Transfers Overall transfer level: Needs assistance   Transfers: Sit to/from Stand Sit to Stand: Min assist         General transfer comment: cues for hand placement and sequence with assit for anterior translation  Ambulation/Gait Ambulation/Gait assistance: Supervision Ambulation Distance (Feet): 60 Feet Assistive device: Rolling walker (2 wheeled) Gait Pattern/deviations: Step-to pattern;Decreased stride length   Gait velocity interpretation: Below normal speed for age/gender General Gait Details: cues for posture, position in RW, looking up and step through pattern  Stairs            Wheelchair Mobility    Modified Rankin (Stroke Patients Only)       Balance                                             Pertinent Vitals/Pain Pain Assessment: 0-10 Pain Score: 4  Pain Location: left hip Pain Descriptors / Indicators: Aching Pain  Intervention(s): Limited activity within patient's tolerance;Monitored during session;Premedicated before session;Repositioned    Home Living Family/patient expects to be discharged to:: Private residence Living Arrangements: Spouse/significant other Available Help at Discharge: Family;Available 24 hours/day Type of Home: House Home Access: Stairs to enter Entrance Stairs-Rails: None Entrance Stairs-Number of Steps: 2 Home Layout: One level Home Equipment: Walker - 2 wheels;Cane - single point;Bedside commode;Other (comment) (rail on right side of bed)      Prior Function Level of Independence: Independent         Comments: pt was having difficulty with tub and car transfers as well as low surfaces PTA     Hand Dominance        Extremity/Trunk Assessment   Upper Extremity Assessment: Overall WFL for tasks assessed           Lower Extremity Assessment: Generalized weakness;LLE deficits/detail   LLE Deficits / Details: pt with very limited hip ABduct/ADD grossly 20 degrees, decreased strength and ROM also limited by post op pain  Cervical / Trunk Assessment: Kyphotic  Communication   Communication: No difficulties  Cognition Arousal/Alertness: Awake/alert Behavior During Therapy: WFL for tasks assessed/performed Overall Cognitive Status: Within Functional Limits for tasks assessed                      General Comments      Exercises Total Joint Exercises Heel Slides: AAROM;Left;10 reps;Supine Hip ABduction/ADduction: AAROM;Left;10 reps;Supine  Assessment/Plan    PT Assessment Patient needs continued PT services  PT Diagnosis Difficulty walking;Acute pain   PT Problem List Decreased strength;Decreased range of motion;Decreased activity tolerance;Decreased balance;Decreased mobility;Pain;Decreased knowledge of use of DME  PT Treatment Interventions DME instruction;Gait training;Stair training;Functional mobility training;Therapeutic  activities;Therapeutic exercise;Patient/family education   PT Goals (Current goals can be found in the Care Plan section) Acute Rehab PT Goals Patient Stated Goal: woodworking and watch my grandson play ball PT Goal Formulation: With patient Time For Goal Achievement: 08/18/15 Potential to Achieve Goals: Good    Frequency 7X/week   Barriers to discharge   Wife recovering from TKA    Co-evaluation               End of Session Equipment Utilized During Treatment: Gait belt Activity Tolerance: Patient tolerated treatment well Patient left: in chair;with call bell/phone within reach Nurse Communication: Mobility status;Weight bearing status         Time: 0722-0749 PT Time Calculation (min) (ACUTE ONLY): 27 min   Charges:   PT Evaluation $PT Eval Moderate Complexity: 1 Procedure PT Treatments $Gait Training: 8-22 mins   PT G CodesDelorse Lek 08/11/2015, 7:55 AM Delaney Meigs, PT [phone]

## 2015-08-11 NOTE — Progress Notes (Signed)
Occupational Therapy Evaluation Patient Details Name: [name] [name] MRN: [id number] DOB: 1950/06/25 Today's Date: 08/11/2015    History of Present Illness Pt admitted for left THA anterior approach. PMHx: DM, HTN, restless leg, CAD, SOB, mitral valve replacement   Clinical Impression   PTA, pt was independent with ADLs and mobility. Pt currently presents with acute L hip pain and required min guard-min assist for functional transfers and LB ADLs. Began education on compensatory strategies for ADLs and availability of AE. Pt plans to d/c home with 24/7 assistance from wife. Pt will benefit from continued acute OT to increase independence and safety with ADLs and mobility to allow for safe discharge home. No OT follow up or DME recommended at this time.    Follow Up Recommendations  No OT follow up;Supervision/Assistance - 24 hour    Equipment Recommendations  Other (comment) (Adaptive equipment - pt to purchase if needed)    Recommendations for Other Services       Precautions / Restrictions Precautions Precautions: Fall Restrictions Weight Bearing Restrictions: Yes LLE Weight Bearing: Weight bearing as tolerated      Mobility Bed Mobility               General bed mobility comments: Pt up in chair on OT arrival  Transfers Overall transfer level: Needs assistance Equipment used: Rolling walker (2 wheeled) Transfers: Sit to/from Stand Sit to Stand: Min assist         General transfer comment: Min assist for boost to stand from low-sitting toilet - all other sit-stand transfers pt required min guard assist. Verbal cues for safe hand placement on seated surfaces    Balance Overall balance assessment: Needs assistance Sitting-balance support: No upper extremity supported;Feet supported Sitting balance-Leahy Scale: Fair     Standing balance support: Bilateral upper extremity supported;During functional activity Standing balance-Leahy Scale: Poor Standing  balance comment: Reliant on UE support from RW to maintain balance                            ADL Overall ADL's : Needs assistance/impaired Eating/Feeding: Set up;Sitting   Grooming: Wash/dry hands;Min guard;Standing           Upper Body Dressing : Set up;Sitting   Lower Body Dressing: Moderate assistance;Sit to/from stand;Cueing for compensatory techniques   Toilet Transfer: Cueing for safety;Ambulation;Minimal assistance;RW;Regular Teacher, adult education Details (indicate cue type and reason): Pt with no BSC in room Toileting- Clothing Manipulation and Hygiene: Minimal assistance;Sit to/from stand       Functional mobility during ADLs: Minimal assistance;Rolling walker General ADL Comments: Began education on compensatory strategies for ADLs and availability of AE     Vision Vision Assessment?: No apparent visual deficits   Perception     Praxis      Pertinent Vitals/Pain Pain Assessment: 0-10 Pain Score: 7  Pain Location: L hip Pain Descriptors / Indicators: Aching;Sore Pain Intervention(s): Limited activity within patient's tolerance;Monitored during session;Repositioned;Patient requesting pain meds-RN notified     Hand Dominance Right   Extremity/Trunk Assessment Upper Extremity Assessment Upper Extremity Assessment: Overall WFL for tasks assessed   Lower Extremity Assessment Lower Extremity Assessment: Defer to PT evaluation   Cervical / Trunk Assessment Cervical / Trunk Assessment: Kyphotic   Communication Communication Communication: No difficulties   Cognition Arousal/Alertness: Awake/alert Behavior During Therapy: WFL for tasks assessed/performed Overall Cognitive Status: Within Functional Limits for tasks assessed  General Comments       Exercises       Shoulder Instructions      Home Living Family/patient expects to be discharged to:: Private residence Living Arrangements: Spouse/significant  other Available Help at Discharge: Family;Available 24 hours/day Type of Home: House Home Access: Stairs to enter [employer] of Steps: 2 Entrance Stairs-Rails: None Home Layout: One level     Bathroom Shower/Tub: Tub/shower unit;Curtain Shower/tub characteristics: Engineer, building services: Standard Bathroom Accessibility: Yes How Accessible: Accessible via walker Home Equipment: Walker - 2 wheels;Cane - single point;Bedside commode;Other (comment) (R bedrail)          Prior Functioning/Environment Level of Independence: Independent        Comments: Does wood working and crafts    OT Diagnosis: Generalized weakness;Acute pain   OT Problem List: Decreased strength;Decreased range of motion;Decreased activity tolerance;Impaired balance (sitting and/or standing);Decreased coordination;Decreased safety awareness;Decreased knowledge of use of DME or AE;Pain;Obesity   OT Treatment/Interventions: Self-care/ADL training;Therapeutic exercise;Energy conservation;DME and/or AE instruction;Therapeutic activities;Balance training;Patient/family education    OT Goals(Current goals can be found in the care plan section) Acute Rehab OT Goals Patient Stated Goal: woodworking and watch my grandson play ball OT Goal Formulation: With patient Time For Goal Achievement: 08/25/15 Potential to Achieve Goals: Good ADL Goals Pt Will Perform Lower Body Bathing: with supervision;with adaptive equipment;sitting/lateral leans;sit to/from stand Pt Will Perform Lower Body Dressing: with supervision;with adaptive equipment;sitting/lateral leans;sit to/from stand Pt Will Transfer to Toilet: with supervision;ambulating;bedside commode (with BSC over toilet) Pt Will Perform Toileting - Clothing Manipulation and hygiene: with supervision;sitting/lateral leans;sit to/from stand Pt Will Perform Tub/Shower Transfer: Tub transfer;with supervision;ambulating;3 in 1;rolling walker  OT Frequency: Min  2X/week   Barriers to D/C:            Co-evaluation              End of Session Equipment Utilized During Treatment: Gait belt;Rolling walker Nurse Communication: Mobility status;Weight bearing status  Activity Tolerance: Patient tolerated treatment well Patient left: in chair;with call bell/phone within reach   Time: 1478-2956 OT Time Calculation (min): 15 min Charges:  OT General Charges $OT Visit: 1 Procedure OT Evaluation $OT Eval Moderate Complexity: 1 Procedure G-Codes:    Nils Pyle, OTR/L Pager: [phone] 08/11/2015, 5:13 PM

## 2015-08-12 LAB — GLUCOSE, CAPILLARY
GLUCOSE-CAPILLARY: 144 mg/dL — AB (ref 65–99)
GLUCOSE-CAPILLARY: 118 mg/dL — AB (ref 65–99)
Glucose-Capillary: 134 mg/dL — ABNORMAL HIGH (ref 65–99)
Glucose-Capillary: 123 mg/dL — ABNORMAL HIGH (ref 65–99)

## 2015-08-12 LAB — CBC
HCT: 29.5 % — ABNORMAL LOW (ref 39.0–52.0)
Hemoglobin: 8.9 g/dL — ABNORMAL LOW (ref 13.0–17.0)
MCH: 23 pg — AB (ref 26.0–34.0)
MCHC: 30.2 g/dL (ref 30.0–36.0)
MCV: 76.2 fL — ABNORMAL LOW (ref 78.0–100.0)
PLATELETS: 186 10*3/uL (ref 150–400)
RBC: 3.87 MIL/uL — ABNORMAL LOW (ref 4.22–5.81)
RDW: 17.4 % — AB (ref 11.5–15.5)
WBC: 9.3 10*3/uL (ref 4.0–10.5)

## 2015-08-12 NOTE — Progress Notes (Signed)
Subjective: 2 Days Post-Op Procedure(s) (LRB): LEFT TOTAL HIP ARTHROPLASTY ANTERIOR APPROACH (Left) Patient reports pain as moderate.  H/H stable.  Objective: Vital signs in last 24 hours: Temp:  [98.4 F (36.9 C)-98.5 F (36.9 C)] 98.5 F (36.9 C) (02/18 0531) Pulse Rate:  [78-97] 78 (02/18 0531) Resp:  [16] 16 (02/18 0531) BP: (128-136)/(69-88) 136/88 mmHg (02/18 0531) SpO2:  [95 %-100 %] 100 % (02/18 0531)  Intake/Output from previous day: 02/17 0701 - 02/18 0700 In: 480 [P.O.:480] Out: -  Intake/Output this shift:     Recent Labs  08/11/15 0559 08/12/15 0428  HGB 8.7* 8.9*    Recent Labs  08/11/15 0559 08/12/15 0428  WBC 6.9 9.3  RBC 3.99* 3.87*  HCT 30.2* 29.5*  PLT 199 186    Recent Labs  08/11/15 0559  NA 136  K 4.1  CL 103  CO2 26  BUN 15  CREATININE 1.10  GLUCOSE 184*  CALCIUM 8.5*    Recent Labs  08/10/15 1307  INR 1.14    Sensation intact distally Intact pulses distally Dorsiflexion/Plantar flexion intact Incision: scant drainage  Assessment/Plan: 2 Days Post-Op Procedure(s) (LRB): LEFT TOTAL HIP ARTHROPLASTY ANTERIOR APPROACH (Left) Up with therapy Plan for discharge tomorrow  Kathryne Hitch 08/12/2015, 10:32 AM

## 2015-08-12 NOTE — Progress Notes (Signed)
Physical Therapy Treatment Patient Details Name: SUMMIT ARROYAVE MRN: [id number] DOB: 09-27-1950 Today's Date: 08-20-2015    History of Present Illness Pt admitted for left THA anterior approach. PMHx: DM, HTN, restless leg, CAD, SOB, mitral valve replacement    PT Comments    Patient progressing steadily with mobility.  Still limited by SOB and still ambulating at reduced pace.  Anticipate patient will be ready for d/c home tomorrow as planned.  Will benefit from continued PT until discharge to progress mobility and increase independence.    Follow Up Recommendations  Home health PT     Equipment Recommendations  None recommended by PT    Recommendations for Other Services       Precautions / Restrictions Precautions Precautions: Fall Restrictions LLE Weight Bearing: Weight bearing as tolerated    Mobility  Bed Mobility Overal bed mobility: Needs Assistance Bed Mobility: Supine to Sit     Supine to sit: Mod assist     General bed mobility comments: assist to raise shoulders off bed; slight assist with LE  Transfers Overall transfer level: Needs assistance Equipment used: Rolling walker (2 wheeled) Transfers: Sit to/from Stand Sit to Stand: Min guard            Ambulation/Gait Ambulation/Gait assistance: Supervision Ambulation Distance (Feet): 100 Feet Assistive device: Rolling walker (2 wheeled) Gait Pattern/deviations: Step-through pattern;Decreased stride length   Gait velocity interpretation: Below normal speed for age/gender     Stairs            Wheelchair Mobility    Modified Rankin (Stroke Patients Only)       Balance   Sitting-balance support: No upper extremity supported Sitting balance-Leahy Scale: Fair     Standing balance support: Bilateral upper extremity supported Standing balance-Leahy Scale: Poor Standing balance comment: requires RW for balance support                    Cognition Arousal/Alertness:  Awake/alert Behavior During Therapy: WFL for tasks assessed/performed Overall Cognitive Status: Within Functional Limits for tasks assessed                      Exercises Total Joint Exercises Quad Sets: AROM;Left;10 reps;Seated Hip ABduction/ADduction: Left;10 reps;Standing;AROM Marching in Standing: AROM;Left;10 reps;Standing Standing Hip Extension: AROM;Left;10 reps;Standing    General Comments        Pertinent Vitals/Pain Pain Assessment: 0-10 Pain Score: 4  Pain Location: l hip Pain Descriptors / Indicators: Aching;Sore Pain Intervention(s): Monitored during session    Home Living                      Prior Function            PT Goals (current goals can now be found in the care plan section) Progress towards PT goals: Progressing toward goals    Frequency  7X/week    PT Plan Current plan remains appropriate    Co-evaluation             End of Session Equipment Utilized During Treatment: Gait belt Activity Tolerance: Patient tolerated treatment well Patient left: in chair;with call bell/phone within reach     Time: 1157-1222 PT Time Calculation (min) (ACUTE ONLY): 25 min  Charges:  $Gait Training: 8-22 mins $Therapeutic Exercise: 8-22 mins                    G Codes:      Olivia Canter 20-Aug-2015, 2:22  PM  08/12/2015 Corlis Hove, PT [phone]

## 2015-08-12 NOTE — Progress Notes (Signed)
OT Cancellation Note  Patient DCADEL STAIRS: [name] [name] MRN: [id number] DOB: 18-Oct-1950   Cancelled Treatment:    Reason Eval/Treat Not Completed: Pain limiting ability to participate. Pt. Requests therapy "later" stating pain as the main reason he can not participate right now.  Will check back as able.    Robet Leu, COTA/L 08/12/2015, 9:00 AM

## 2015-08-12 NOTE — Progress Notes (Signed)
PT Cancellation Note  Patient Details Name: [name] [name] MRN: [id number] DOB: 11-27-1950   Cancelled Treatment:    Reason Eval/Treat Not Completed: Fatigue/lethargy limiting ability to participate   Olivia Canter 08/12/2015, 4:20 PM

## 2015-08-13 LAB — CBC
HCT: 28.7 % — ABNORMAL LOW (ref 39.0–52.0)
Hemoglobin: 8.7 g/dL — ABNORMAL LOW (ref 13.0–17.0)
MCH: 23.3 pg — AB (ref 26.0–34.0)
MCHC: 30.3 g/dL (ref 30.0–36.0)
MCV: 76.7 fL — ABNORMAL LOW (ref 78.0–100.0)
PLATELETS: 191 10*3/uL (ref 150–400)
RBC: 3.74 MIL/uL — AB (ref 4.22–5.81)
RDW: 17.5 % — AB (ref 11.5–15.5)
WBC: 9.9 10*3/uL (ref 4.0–10.5)

## 2015-08-13 LAB — GLUCOSE, CAPILLARY
GLUCOSE-CAPILLARY: 128 mg/dL — AB (ref 65–99)
Glucose-Capillary: 117 mg/dL — ABNORMAL HIGH (ref 65–99)

## 2015-08-13 MED ORDER — METHOCARBAMOL 500 MG PO TABS: 500.0000 mg | ORAL_TABLET | Freq: Four times a day (QID) | ORAL | Status: DC | PRN

## 2015-08-13 MED ORDER — OXYCODONE-ACETAMINOPHEN 5-325 MG PO TABS: 1.0000 | ORAL_TABLET | ORAL | Status: DC | PRN

## 2015-08-13 NOTE — Progress Notes (Signed)
Occupational Therapy Treatment Patient Details Name: [name] [name] MRN: [id number] DOB: 28-Feb-1951 Today's Date: 08/13/2015    History of present illness Pt admitted for left THA anterior approach. PMHx: DM, HTN, restless leg, CAD, SOB, mitral valve replacement   OT comments  Pt. Remains reluctant for participation in skilled OT but with max coaxing agreed to OOB.  Completed in/out of bed and tub transfer this session.  Eager for d/c home.  Will continue to follow acutely.    Follow Up Recommendations  No OT follow up;Supervision/Assistance - 24 hour    Equipment Recommendations       Recommendations for Other Services      Precautions / Restrictions Precautions Precautions: Fall Precaution Comments: Fall risk greatly reduced Restrictions LLE Weight Bearing: Weight bearing as tolerated       Mobility Bed Mobility Overal bed mobility: Needs Assistance Bed Mobility: Rolling;Sidelying to Sit Rolling: Min guard Sidelying to sit: Mod assist Supine to sit: Mod assist     General bed mobility comments: states he exists from R side of bed, hob flat, states he has a bed rail attached to bed and insisted on using it, still requiring mod physical assist to bring trunk upright.  physical assistance to bring b les into bed at end of session also  Transfers Overall transfer level: Needs assistance Equipment used: Rolling walker (2 wheeled) Transfers: Sit to/from [employer] Sit to Stand: Supervision;Modified independent (Device/Increase time) Stand pivot transfers: Supervision       General transfer comment: Good hand placement; slow, but steady; initial cues to breathe during transitions    Balance             Standing balance-Leahy Scale: Fair                     ADL Overall ADL's : Needs assistance/impaired                       Lower Body Dressing Details (indicate cue type and reason): reports wife will assist Toilet Transfer:  Min guard;Cueing for sequencing;Cueing for safety;Stand-pivot;RW Toilet Transfer Details (indicate cue type and reason): simulated with use of eob to recliner  Toileting- Clothing Manipulation and Hygiene: Minimal assistance;Sit to/from stand Toileting - Clothing Manipulation Details (indicate cue type and reason): simulated during transfers in room Tub/ Shower Transfer: Tub transfer;Min guard;Rolling walker;Ambulation Tub/Shower Transfer Details (indicate cue type and reason): pt. able to side step into tub with left faucet, educated on how wife would need to stabalize the walker during in/out of tub.  reports he has all DME from previous family members sx. Functional mobility during ADLs: Min guard;Rolling walker General ADL Comments: resistant and reluctant to any participation in skilled therapies but also states he is ready for home      Vision                     Perception     Praxis      Cognition   Behavior During Therapy: [facility] for tasks assessed/performed Overall Cognitive Status: Within Functional Limits for tasks assessed                       Extremity/Trunk Assessment               Exercises     Shoulder Instructions       General Comments      Pertinent Vitals/ Pain  Pain Assessment: No/denies pain Pain Score: 5  Pain Location: L hip Pain Descriptors / Indicators: Aching;Sore Pain Intervention(s): Monitored during session  Home Living                                          Prior Functioning/Environment              Frequency Min 2X/week     Progress Toward Goals  OT Goals(current goals can now be found in the care plan section)  Progress towards OT goals: Progressing toward goals  Acute Rehab OT Goals Patient Stated Goal: woodworking and watch my grandson play ball  Plan Discharge plan remains appropriate    Co-evaluation                 End of Session Equipment Utilized During  Treatment: Gait belt;Rolling walker   Activity Tolerance Patient tolerated treatment well   Patient Left in bed;with call bell/phone within reach   Nurse Communication          Time: 0940-1005 OT Time Calculation (min): 25 min  Charges: OT General Charges $OT Visit: 1 Procedure OT Treatments $Self Care/Home Management : 23-37 mins  Robet Leu, COTA/L 08/13/2015, 10:27 AM

## 2015-08-13 NOTE — Progress Notes (Signed)
Physical Therapy Treatment Patient Details Name: [name] [name] MRN: [id number] DOB: [birth year] Today's Date: 08/13/2015    History of Present Illness Pt admitted for left THA anterior approach. PMHx: DM, HTN, restless leg, CAD, SOB, mitral valve replacement    PT Comments    Continuing progress with mobiltiy and activity tolerance; Stair training complete; OK for dc home from PT standpoint   Follow Up Recommendations  Outpatient PT;Other (comment) (Pt choosing Outptatient)     Equipment Recommendations  None recommended by PT    Recommendations for Other Services       Precautions / Restrictions Precautions Precautions: Fall Precaution Comments: Fall risk greatly reduced Restrictions LLE Weight Bearing: Weight bearing as tolerated    Mobility  Bed Mobility               General bed mobility comments: Reports his technique for getting in and out of bed was manageable  Transfers Overall transfer level: Needs assistance Equipment used: Rolling walker (2 wheeled) Transfers: Sit to/from Stand Sit to Stand: Supervision;Modified independent (Device/Increase time)         General transfer comment: Good hand placement; slow, but steady; initial cues to breathe during transitions  Ambulation/Gait Ambulation/Gait assistance: Supervision;Modified independent (Device/Increase time) Ambulation Distance (Feet): 180 Feet Assistive device: Rolling walker (2 wheeled) Gait Pattern/deviations: Step-through pattern;Decreased step length - right;Decreased step length - left Gait velocity: slow   General Gait Details: Noting natural progress to step-through pattern; min initial cues for posture, and position in RW   Stairs Stairs: Yes Stairs assistance: Min assist Stair Management: No rails;With walker;Backwards;Step to pattern Number of Stairs: 2 General stair comments: Demonstrational and verbal cues for sequencing and technique  Wheelchair Mobility    Modified  Rankin (Stroke Patients Only)       Balance             Standing balance-Leahy Scale: Fair                      Cognition Arousal/Alertness: Awake/alert Behavior During Therapy: WFL for tasks assessed/performed Overall Cognitive Status: Within Functional Limits for tasks assessed                      Exercises      General Comments        Pertinent Vitals/Pain Pain Assessment: 0-10 Pain Score: 5  Pain Location: L hip Pain Descriptors / Indicators: Aching;Sore Pain Intervention(s): Monitored during session    Home Living                      Prior Function            PT Goals (current goals can now be found in the care plan section) Acute Rehab PT Goals Patient Stated Goal: woodworking and watch my grandson play ball PT Goal Formulation: With patient Time For Goal Achievement: 08/18/15 Potential to Achieve Goals: Good Progress towards PT goals: Progressing toward goals (progress twoards goals is adequate for dc)    Frequency  7X/week    PT Plan Current plan remains appropriate    Co-evaluation             End of Session   Activity Tolerance: Patient tolerated treatment well Patient left: in chair;with call bell/phone within reach     Time: 0810-0842 PT Time Calculation (min) (ACUTE ONLY): 32 min  Charges:  $Gait Training: 23-37 mins  G Codes:      Van Clines [facility] 08/13/2015, 8:50 AM  Van Clines, PT  Acute Rehabilitation Services Pager [phone] Office [phone]

## 2015-08-13 NOTE — Discharge Summary (Signed)
Patient ID: [name] [name] MRN: [id number] DOB/AGE: 66-Apr-1952 66 y.o.  Admit date: 08/10/2015 Discharge date: 08/13/2015  Admission Diagnoses:  Principal Problem:   Osteoarthritis of right hip Active Problems:   Status post total replacement of left hip   Discharge Diagnoses:  Same  Past Medical History  Diagnosis Date  . Obesity   . Hypertension   . Depression   . 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 Diabetes mellitus   . Hyperlipidemia   . Coronary artery disease (CAD) excluded 2008, 2011    Mild coronary plaque.  w/ normal LV function. repeated cardiac cath on 07/2009  . 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 History of endocarditis     status post Enterococcus faecalis, secondary to chronic injections with low molecular weight heparin  . Deep venous thrombosis (HCC)     recurrent DVT and PE, status post IVC filter  . OSA (obstructive sleep apnea)     CPAP  . CHF (congestive heart failure) (HCC)   . Dyspnea     with exertion  . GERD (gastroesophageal reflux disease)   . Arthritis   . Restless leg syndrome     Surgeries: Procedure(s): LEFT TOTAL HIP ARTHROPLASTY ANTERIOR APPROACH on 08/10/2015   Consultants:    Discharged Condition: Improved  Hospital Course: [name] [name] is an 65 y.o. male who was admitted 08/10/2015 for operative treatment ofOsteoarthritis of right hip. Patient has severe unremitting pain that affects sleep, daily activities, and work/hobbies. After pre-op clearance the patient was taken to the operating room on 08/10/2015 and underwent  Procedure(s): LEFT TOTAL HIP ARTHROPLASTY ANTERIOR APPROACH.    Patient was given perioperative antibiotics: Anti-infectives    Start     Dose/Rate Route Frequency Ordered Stop   08/10/15 2130  ceFAZolin (ANCEF) IVPB 2 g/50 mL premix     2 g 100 mL/hr over  30 Minutes Intravenous Every 6 hours 08/10/15 2007 08/11/15 0455   08/10/15 1330  ceFAZolin (ANCEF) 3 g in dextrose 5 % 50 mL IVPB     3 g 130 mL/hr over 30 Minutes Intravenous To ShortStay Surgical 08/10/15 1322 08/10/15 1535   08/08/15 1330  ceFAZolin (ANCEF) 3 g in dextrose 5 % 50 mL IVPB     3 g 130 mL/hr over 30 Minutes Intravenous To ShortStay Surgical 08/07/15 1326 08/09/15 1330       Patient was given sequential compression devices, early ambulation, and chemoprophylaxis to prevent DVT.  Patient benefited maximally from hospital stay and there were no complications.    Recent vital signs: Patient Vitals for the past 24 hrs:  BP Temp Temp src Pulse Resp SpO2  08/13/15 0456 106/65 mmHg 98.9 F (37.2 C) Oral 89 16 94 %  08/12/15 2104 117/75 mmHg 98.5 F (36.9 C) Oral 94 16 93 %  08/12/15 1435 (!) 103/57 mmHg 98 F (36.7 C) Oral 84 16 95 %     Recent laboratory studies:  Recent Labs  08/10/15 1307  08/11/15 0559 08/12/15 0428 08/13/15 0531  WBC  --   < > 6.9 9.3 9.9  HGB  --   < > 8.7* 8.9* 8.7*  HCT  --   < > 30.2* 29.5* 28.7*  PLT  --   < > 199 186 191  NA  --   --  136  --   --  K  --   --  4.1  --   --   CL  --   --  103  --   --   CO2  --   --  26  --   --   BUN  --   --  15  --   --   CREATININE  --   --  1.10  --   --   GLUCOSE  --   --  184*  --   --   INR 1.14  --   --   --   --   CALCIUM  --   --  8.5*  --   --   < > = values in this interval not displayed.   Discharge Medications:     Medication List    STOP taking these medications        HYDROcodone-acetaminophen 5-325 MG tablet  Commonly known as:  NORCO/VICODIN      TAKE these medications        acetaminophen 650 MG CR tablet  Commonly known as:  TYLENOL  Take 1,300 mg by mouth daily.     BYDUREON 2 MG Pen  Generic drug:  Exenatide ER  Inject 2 mg into the skin every Sunday.     diazepam 5 MG tablet  Commonly known as:  VALIUM  Take 5 mg by mouth at bedtime.     ECOTRIN 325 MG  EC tablet  Generic drug:  aspirin  Take 325 mg by mouth daily.     FLUoxetine 40 MG capsule  Commonly known as:  PROZAC  Take 40 mg by mouth daily.     furosemide 40 MG tablet  Commonly known as:  LASIX  Take 40 mg by mouth Daily.     levothyroxine 112 MCG tablet  Commonly known as:  SYNTHROID, LEVOTHROID  Take 224 mcg by mouth daily.     lisinopril 10 MG tablet  Commonly known as:  PRINIVIL,ZESTRIL  Take 10 mg by mouth daily.     metFORMIN 500 MG tablet  Commonly known as:  GLUCOPHAGE  Take 500-1,000 mg by mouth 2 (two) times daily after a meal. 500 mg every morning and 1000 mg every evening     methocarbamol 500 MG tablet  Commonly known as:  ROBAXIN  Take 1 tablet (500 mg total) by mouth every 6 (six) hours as needed for muscle spasms.     metoprolol tartrate 25 MG tablet  Commonly known as:  LOPRESSOR  take 1 tablet by mouth once daily     NON FORMULARY  14 setting     omeprazole 20 MG capsule  Commonly known as:  PRILOSEC  Take 20 mg by mouth at bedtime.     oxyCODONE-acetaminophen 5-325 MG tablet  Commonly known as:  ROXICET  Take 1-2 tablets by mouth every 4 (four) hours as needed.     pramipexole 0.25 MG tablet  Commonly known as:  MIRAPEX  Take 2 tablets by mouth after dinner each night     rivaroxaban 20 MG Tabs tablet  Commonly known as:  XARELTO  Take 1 tablet (20 mg total) by mouth daily.     simvastatin 40 MG tablet  Commonly known as:  ZOCOR  take 1 tablet by mouth at bedtime     VISINE 0.05 % ophthalmic solution  Generic drug:  tetrahydrozoline  Place 1 drop into both eyes daily.     Vitamin D3 5000 units Caps  Take  1 capsule by mouth daily.        Diagnostic Studies: Dg Pelvis Portable  08/10/2015  CLINICAL DATA:  Status post total hip replacement on the left. EXAM: PORTABLE PELVIS 1-2 VIEWS; DG HIP (WITH OR WITHOUT PELVIS) 1V PORT LEFT COMPARISON:  Fluoroscopy from earlier today FINDINGS: Total left hip arthroplasty without  dislocation or periprosthetic fracture. Negative right hip. IMPRESSION: Unremarkable left hip arthroplasty. Electronically Signed   By: Marnee Spring M.D.   On: 08/10/2015 18:17   Dg Hip Port Unilat With Pelvis 1v Left  08/10/2015  CLINICAL DATA:  Status post total hip replacement on the left. EXAM: PORTABLE PELVIS 1-2 VIEWS; DG HIP (WITH OR WITHOUT PELVIS) 1V PORT LEFT COMPARISON:  Fluoroscopy from earlier today FINDINGS: Total left hip arthroplasty without dislocation or periprosthetic fracture. Negative right hip. IMPRESSION: Unremarkable left hip arthroplasty. Electronically Signed   By: Marnee Spring M.D.   On: 08/10/2015 18:17   Dg Hip Operative Unilat With Pelvis Left  08/10/2015  CLINICAL DATA:  Anterior approach left total hip arthroplasty ; reported fluoro time 37 seconds EXAM: OPERATIVE left HIP (WITH PELVIS IF PERFORMED)  VIEWS TECHNIQUE: Fluoroscopic spot image(s) were submitted for interpretation post-operatively. COMPARISON:  Preoperative studies of December 15, 2014. FINDINGS: Two fluoro spot images are submitted. The prosthetic left hip joint appears to be in appropriate position. The interface with the native bone is normal. The observed portions of the native bones are normal. IMPRESSION: Status post left total hip joint replacement without evidence of immediate postprocedure complication. Electronically Signed   By: David  [place] M.D.   On: 08/10/2015 17:00    Disposition: 01-Home or Self Care      Discharge Instructions    Discharge patient    Complete by:  As directed            Follow-up Information    Follow up with Kathryne Hitch, MD. Schedule an appointment as soon as possible for a visit in 2 weeks.   Specialty:  Orthopedic Surgery   Why:  They will contact you to schedule home therapy visits.   Contact information:   [address] [place] [address] [phone]        Signed: Kathryne Hitch 08/13/2015, 8:25 AM

## 2015-08-13 NOTE — Progress Notes (Signed)
Patient ID: [name] [name], male   DOB: 1951-02-08, 65 y.o.   MRN: [id number] Doing really well.  Working great with therapy.  H/H remains stable.  Can be discharged to home today.

## 2015-08-14 ENCOUNTER — Encounter (HOSPITAL_COMMUNITY): Payer: Self-pay | Admitting: Orthopaedic Surgery

## 2015-08-18 ENCOUNTER — Ambulatory Visit: Payer: Medicare Other | Admitting: Cardiovascular Disease

## 2015-08-21 ENCOUNTER — Encounter (HOSPITAL_COMMUNITY): Payer: Self-pay | Admitting: Physical Therapy

## 2015-08-21 ENCOUNTER — Ambulatory Visit (HOSPITAL_COMMUNITY): Payer: Medicare Other | Attending: Orthopaedic Surgery | Admitting: Physical Therapy

## 2015-08-21 DIAGNOSIS — R2681 Unsteadiness on feet: Secondary | ICD-10-CM | POA: Diagnosis present

## 2015-08-21 DIAGNOSIS — Z7409 Other reduced mobility: Secondary | ICD-10-CM | POA: Diagnosis present

## 2015-08-21 DIAGNOSIS — R262 Difficulty in walking, not elsewhere classified: Secondary | ICD-10-CM

## 2015-08-21 DIAGNOSIS — Z96642 Presence of left artificial hip joint: Secondary | ICD-10-CM

## 2015-08-21 DIAGNOSIS — M6281 Muscle weakness (generalized): Secondary | ICD-10-CM

## 2015-08-21 NOTE — Patient Instructions (Signed)
Bridge    Lie back, legs bent. Inhale, pressing hips up. Keeping ribs in, lengthen lower back. Exhale, rolling down along spine from top. Repeat _10___ times. Do __2-3__ sessions per day.  http://pm.exer.us/54   Copyright  VHI. All rights reserved.   Functional Quadriceps: Sit to Stand    Sit on edge of chair, feet flat on floor. Stand upright, extending knees fully. Make sure you have scooted to the edge of the chair, then: 1) make sure your feet are under you 2) lean forward (nose over toes) 3) push up from the surface you are sitting on  Repeat __10__ times per set. Do _1___ sets per session. Do _2-3___ sessions per day.  http://orth.exer.us/734   Copyright  VHI. All rights reserved.      WALKER HIP ABDUCTION - FWW ABDUCTIONS  While standing up using a walker, raise your leg out to the side. Keep your knee straight and maintain your toes pointed forward the entire time.   Use your arms for support and balance and have a chair behind you for safety.  Repeat 10 times, 2-3 timse per day.    Lateral Weight Shifts  Shift weight on either feet, putting as much weight as you can tolerate down through your Left leg.  Repeat 10-20 times, 2-3 times per day.

## 2015-08-21 NOTE — Therapy (Signed)
[facility] [address] [place], [place], [address] Phone: [phone]   Fax:  [phone]  Physical Therapy Evaluation  Patient Details  Name: [name] [name] MRN: [id number] Date of Birth: 08-25-1950 Referring Provider: Doneen Poisson   Encounter Date: 08/21/2015      PT End of Session - 08/21/15 1549    Visit Number 1   Number of Visits 18   Date for PT Re-Evaluation 09/18/15   Authorization Type [employer] Medicare PPO    Authorization Time Period 08/21/15 to 10/02/15   Authorization - Visit Number 1   Authorization - Number of Visits 10   PT Start Time 1148   PT Stop Time 1231   PT Time Calculation (min) 43 min   Activity Tolerance Patient tolerated treatment well   Behavior During Therapy [facility] for tasks assessed/performed      Past Medical History  Diagnosis Date  . Obesity   . Hypertension   . Depression   . 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 Diabetes mellitus   . Hyperlipidemia   . Coronary artery disease (CAD) excluded 2008, 2011    Mild coronary plaque.  w/ normal LV function. repeated cardiac cath on 07/2009  . 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 History of endocarditis     status post Enterococcus faecalis, secondary to chronic injections with low molecular weight heparin  . Deep venous thrombosis (HCC)     recurrent DVT and PE, status post IVC filter  . OSA (obstructive sleep apnea)     CPAP  . CHF (congestive heart failure) (HCC)   . Dyspnea     with exertion  . GERD (gastroesophageal reflux disease)   . Arthritis   . Restless leg syndrome     Past Surgical History  Procedure Laterality Date  . Thyroidectomy    . Mitral valve repair  04/10/2011    autologous pericardial patch augmentation of posterior leaflet with 28mm Sorin Memo 3D ring  annuloplasty  . Aortic valve replacement  04/10/2011    25mm [facility] Ease pericardial bioprosthetic aortic valve  . Cardiac catheterization  2008  . Cardiac catheterization  07/2009    minor irregulreties without obstructive disease  . Colonoscopy w/ polypectomy    . Ivc filter placement ([facility] hx)    . Total hip arthroplasty Left 08/10/2015    Procedure: LEFT TOTAL HIP ARTHROPLASTY ANTERIOR APPROACH;  Surgeon: Kathryne Hitch, MD;  Location: [facility] OR;  Service: Orthopedics;  Laterality: Left;    There were no vitals filed for this visit.  Visit Diagnosis:  History of hip replacement, total, left - Plan: PT plan of care cert/re-cert  Muscle weakness - Plan: PT plan of care cert/re-cert  Difficulty walking - Plan: PT plan of care cert/re-cert  Unsteadiness - Plan: PT plan of care cert/re-cert  Impaired functional mobility and activity tolerance - Plan: PT plan of care cert/re-cert      Subjective Assessment - 08/21/15 1155    Subjective Patient reports that he had a hard time finding a surgeon who would work on him but eventually ended up with Dr. Magnus Ivan, who performed L anterior hip replacement last Friday. Has not had HHPT but did get PT while he was still in the hospital. Has 2 steps that he'll need to deal with, has not had any  problems with them thus far. No falls or close calls recently.    Pertinent History HTN, mitral regurgitation, hx of DVT but does have IVC filter, OSA, DM, hypothyroidsm, overweight/obesity, CAD    How long can you sit comfortably? 60 minutes    How long can you stand comfortably? 10-15 minutes    How long can you walk comfortably? gets winded but could maybe walk a couple hundred feet with walker    Patient Stated Goals get back to baseline- get outside, watch grandkids play baseball, get on riding lawnmower    Currently in Pain? Yes   Pain Score 5    Pain Location Hip   Pain Orientation Left   Pain Descriptors / Indicators Throbbing;Sore    Pain Type Acute pain;Surgical pain   Pain Radiating Towards radiating down to foot    Pain Onset In the past 7 days   Pain Frequency Constant   Aggravating Factors  walking, steps    Pain Relieving Factors pain medicines, sleep    Effect of Pain on Daily Activities cannot do regular activities, cannot perform yard work    Multiple Pain Sites No            [facility] PT Assessment - 08/21/15 0001    Assessment   Medical Diagnosis L anterior hip    Referring Provider Doneen Poisson    Onset Date/Surgical Date 08/11/15   Next MD Visit has one, thinks it is the end of this week    Precautions   Precautions Anterior Hip   Precaution Comments L anterior hip    Restrictions   Weight Bearing Restrictions No   Balance Screen   Has the patient fallen in the past 6 months No   Has the patient had a decrease in activity level because of a fear of falling?  No   Is the patient reluctant to leave their home because of a fear of falling?  No   Observation/Other Assessments   Focus on Therapeutic Outcomes (FOTO)  58% limited    Strength   Right Hip Flexion 3/5   Right Hip ABduction --  DNT due to difficulty laying on L side    Left Hip Flexion 2/5  pain limited   Left Hip ABduction 2/5   Right Knee Extension 4/5   Left Knee Extension 3+/5   Right Ankle Dorsiflexion 4+/5   Left Ankle Dorsiflexion 4/5   6 minute walk test results    Aerobic Endurance Distance Walked 200   Endurance additional comments ; walker    Timed Up and Go Test   Normal TUG (seconds) 31.06   TUG Comments walker                            PT Education - 08/21/15 1548    Education provided Yes   Education Details plan of care, HEP, prognosis, general anterior hip precautions    Person(s) Educated Patient;Spouse   Methods Explanation;Handout;Demonstration   Comprehension Verbalized understanding;Need further instruction;Returned demonstration          PT Short Term Goals -  08/21/15 1614    PT SHORT TERM GOAL #1   Title Patient will be able to ambulate at least 455ft during with LRAD, improved gait mechanics including equal stance/step times, equal weight bearing, minimal unsteadiness in order to demonstrate improved general mobility    Time 3   Period Weeks   Status New   PT SHORT TERM  GOAL #2   Title Patient to be able to complete TUG in 15 seconds or less with LRAD in order to demonstrate improved general mobility and reduced fall risk    Time 3   Period Weeks   Status New   PT SHORT TERM GOAL #3   Title Patient to report he has been successfully able to attend his grandchidlren's baseball games with LRAD and minimal unsteadiness or fatigue in order to demonstrate improved level of function in community    Time 3   Period Weeks   Status New   PT SHORT TERM GOAL #4   Title Patient to be indepedent in correctly and consistently performing appropriate HEP, to be updated PRN    Time 3   Period Weeks   Status New           PT Long Term Goals - 2015-08-29 1617    PT LONG TERM GOAL #1   Title Patient will demonstrate strength at least 4+/5 in all tested muscle groups in order to improve general mobility, reduce pain, and improve regional stability    Time 6   Period Weeks   Status New   PT LONG TERM GOAL #2   Title Patient to be unlimited in gait with LRAD, no unsteadiness or fatigue, over even and uneven surfaces, in order to assist in returning to PLOF    Time 6   Period Weeks   Status New   PT LONG TERM GOAL #3   Title Patient to report he has been able to return to yardwork with no falls and pain L hip no more than 2/10 in order to assist in returning to PLOF    Time 6   Status New   PT LONG TERM GOAL #4   Title Patient to be able to reciprocally ascend and descend full flight of stairs with one railing, minimal unsteadiness or fatigue, in order to demonstrate improved mobility at home and in community    Time 6   Period Weeks   Status  New               Plan - Aug 29, 2015 1551    Clinical Impression Statement Patient presents status post L anterior hip replacement which was performed on February 17th; patient reports he has not received any HHPT and basically came to OP as soon as he was able after surgery. Upon examination, patient displays muscle weakness,  impaired gait mechanics, reduced functional activity tolerance, unsteadiness, and reduced functional activity tolearnce. He reports that he is fairly motivated to partiicpate in skilled PT services. At this point recommend skilled PT services nio rder to address functional limitaitons and assist in reaching optimal level of function.    Pt will benefit from skilled therapeutic intervention in order to improve on the following deficits Abnormal gait;Cardiopulmonary status limiting activity;Decreased activity tolerance;Decreased strength;Pain;Decreased balance;Decreased mobility;Difficulty walking;Improper body mechanics;Decreased coordination;Postural dysfunction   Rehab Potential Good   PT Frequency 3x / week   PT Duration 6 weeks   PT Treatment/Interventions ADLs/Self Care Home Management;Cryotherapy;Electrical Stimulation;DME Instruction;Gait training;Stair training;Functional mobility training;Therapeutic activities;Therapeutic exercise;Balance training;Neuromuscular re-education;Patient/family education;Manual techniques;Passive range of motion;Energy conservation;Taping   PT Next Visit Plan review HEP and goals; functional stretching and strengthening    PT Home Exercise Plan given    Consulted and Agree with Plan of Care Patient          G-Codes - August 29, 2015 1749    Functional Assessment Tool Used FOTO 58% limited    Functional  Limitation Mobility: Walking and moving around   Mobility: Walking and Moving Around Current Status [phone]) At least 40 percent but less than 60 percent impaired, limited or restricted   Mobility: Walking and Moving Around Goal Status  [phone]) At least 20 percent but less than 40 percent impaired, limited or restricted       Problem List Patient Active Problem List   Diagnosis Date Noted  . Osteoarthritis of right hip 08/10/2015  . 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 Uncontrolled type 2 diabetes mellitus (HCC) 04/28/2015  . Primary hypothyroidism 04/28/2015  . Hyperlipidemia 04/28/2015  . Essential hypertension, benign 04/28/2015  . 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 Varicose vein 01/24/2012  . RLS (restless legs syndrome) 07/11/2011  . Seroma 07/07/2011  . Deep venous thrombosis (HCC)   . History of endocarditis   . Coronary artery disease (CAD) excluded   . Extrasystole 04/30/2011  . S/P AVR (aortic valve replacement) 04/10/2011  . S/P mitral valve repair 04/10/2011  . Mitral regurgitation 03/29/2011  . Aortic insufficiency   . Dyspnea 11/15/2010  . ENDOCARDITIS 03/20/2010  . SHORTNESS OF BREATH 07/21/2009  . OVERWEIGHT/OBESITY 02/20/2009  . DEPRESSION 02/20/2009  . Obstructive sleep apnea 02/20/2009  . Essential hypertension 02/20/2009  . SKIN LESION 02/20/2009  . CHEST PAIN-UNSPECIFIED 02/20/2009  . TRAUMATIC COMPARTMENT SYNDROME LOWER EXTREMITY 02/20/2009    Nedra Hai PT, DPT [phone]  [facility] [address] [place], [place], [address] Phone: [phone]   Fax:  [phone]  Name: [name] [name] MRN: [id number] Date of Birth: 08-Jan-1951

## 2015-08-23 ENCOUNTER — Ambulatory Visit (HOSPITAL_COMMUNITY): Payer: Medicare Other | Attending: Orthopaedic Surgery

## 2015-08-23 DIAGNOSIS — Z96642 Presence of left artificial hip joint: Secondary | ICD-10-CM | POA: Diagnosis not present

## 2015-08-23 DIAGNOSIS — M6281 Muscle weakness (generalized): Secondary | ICD-10-CM | POA: Diagnosis present

## 2015-08-23 DIAGNOSIS — R2681 Unsteadiness on feet: Secondary | ICD-10-CM

## 2015-08-23 DIAGNOSIS — Z7409 Other reduced mobility: Secondary | ICD-10-CM

## 2015-08-23 DIAGNOSIS — R262 Difficulty in walking, not elsewhere classified: Secondary | ICD-10-CM

## 2015-08-23 NOTE — Therapy (Signed)
[facility] [address] [place], [place], [address] Phone: [phone]   Fax:  [phone]  Physical Therapy Treatment  Patient Details  Name: [name] [name] MRN: [id number] Date of Birth: 1950-12-24 Referring Provider: Doneen Poisson   Encounter Date: 08/23/2015      PT End of Session - 08/23/15 1801    Visit Number 2   Number of Visits 18   Date for PT Re-Evaluation 09/18/15   Authorization Type [employer] Medicare PPO    Authorization Time Period 08/21/15 to 10/02/15   Authorization - Visit Number 2   Authorization - Number of Visits 10   PT Start Time 1725   PT Stop Time 1815   PT Time Calculation (min) 50 min   Activity Tolerance Patient tolerated treatment well   Behavior During Therapy [facility] for tasks assessed/performed      Past Medical History  Diagnosis Date  . Obesity   . Hypertension   . Depression   . 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 Diabetes mellitus   . Hyperlipidemia   . Coronary artery disease (CAD) excluded 2008, 2011    Mild coronary plaque.  w/ normal LV function. repeated cardiac cath on 07/2009  . 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 History of endocarditis     status post Enterococcus faecalis, secondary to chronic injections with low molecular weight heparin  . Deep venous thrombosis (HCC)     recurrent DVT and PE, status post IVC filter  . OSA (obstructive sleep apnea)     CPAP  . CHF (congestive heart failure) (HCC)   . Dyspnea     with exertion  . GERD (gastroesophageal reflux disease)   . Arthritis   . Restless leg syndrome     Past Surgical History  Procedure Laterality Date  . Thyroidectomy    . Mitral valve repair  04/10/2011    autologous pericardial patch augmentation of posterior leaflet with 28mm Sorin Memo 3D ring annuloplasty   . Aortic valve replacement  04/10/2011    25mm [facility] Ease pericardial bioprosthetic aortic valve  . Cardiac catheterization  2008  . Cardiac catheterization  07/2009    minor irregulreties without obstructive disease  . Colonoscopy w/ polypectomy    . Ivc filter placement ([facility] hx)    . Total hip arthroplasty Left 08/10/2015    Procedure: LEFT TOTAL HIP ARTHROPLASTY ANTERIOR APPROACH;  Surgeon: Kathryne Hitch, MD;  Location: [facility] OR;  Service: Orthopedics;  Laterality: Left;    There were no vitals filed for this visit.  Visit Diagnosis:  History of hip replacement, total, left  Unsteadiness  Impaired functional mobility and activity tolerance  Muscle weakness  Difficulty walking      Subjective Assessment - 08/23/15 1731    Subjective Pt reported c/o L anterior hip pain that was rated a 4/10 on a VAS upon arrival. Pt reported good tolerance with initial HEP.    Pertinent History HTN, mitral regurgitation, hx of DVT but does have IVC filter, OSA, DM, hypothyroidsm, overweight/obesity, CAD    How long can you sit comfortably? 60 minutes    How long can you stand comfortably? 10-15 minutes    How long can you walk comfortably? gets winded but could maybe walk a couple hundred feet with walker    Patient Stated Goals  get back to baseline- get outside, watch grandkids play baseball, get on riding lawnmower    Currently in Pain? Yes   Pain Score 4   L hip pain ranges between a 3-10/10 on a VAS   Pain Location Hip   Pain Orientation Left   Pain Descriptors / Indicators Dull;Aching   Pain Type Acute pain;Surgical pain   Pain Frequency Constant   Aggravating Factors  Walking, steps, and sitting for prolonged periods of time    Pain Relieving Factors pain meds, ice, rest, and change in position   Effect of Pain on Daily Activities Limited with long distance ambulation, household chores, and yard work                          [facility]  Treatment/Exercise - 08/23/15 0001    Ambulation/Gait   Ambulation/Gait Yes   Ambulation/Gait Assistance 5: Supervision   Ambulation Distance (Feet) 226 Feet  x 2   Assistive device Rolling walker   Gait Pattern Step-through pattern;Antalgic;Trunk flexed   Gait Comments Gait training with FWW in clinic with focus on postural alignment and heel to toe sequence    Knee/Hip Exercises: Standing   Heel Raises Both;2 sets;10 reps   Knee Flexion Strengthening;1 set;15 reps;Left   Hip ADduction Strengthening;Left;1 set;15 reps   Functional Squat 1 set;10 reps   Other Standing Knee Exercises weight shifting on airex pad in A<>P and M<>L direction x 3 sets  with UE support on parallel bars    Other Standing Knee Exercises All standing ther ex completed with close sup and UE support on walker   Knee/Hip Exercises: Supine   Quad Sets Both;1 set;15 reps   Quad Sets Limitations with 3 sec hold   Heel Slides AAROM;Left;1 set;15 reps   Heel Slides Limitations manual assist   Other Supine Knee/Hip Exercises Glute sets x 1 set of 15 reps with 3 sec hold    Other Supine Knee/Hip Exercises Ankle pumps x 20 reps                PT Education - 08/23/15 1756    Education provided Yes   Education Details HEP, anterior hip precautions, heel to toe gait pattern, 5/5 fall precautions, and s/s of DVT and infection   Person(s) Educated Patient   Methods Explanation;Demonstration   Comprehension Verbalized understanding;Returned demonstration          PT Short Term Goals - 08/21/15 1614    PT SHORT TERM GOAL #1   Title Patient will be able to ambulate at least 460ft during with LRAD, improved gait mechanics including equal stance/step times, equal weight bearing, minimal unsteadiness in order to demonstrate improved general mobility    Time 3   Period Weeks   Status New   PT SHORT TERM GOAL #2   Title Patient to be able to complete TUG in 15 seconds or less with LRAD in order to  demonstrate improved general mobility and reduced fall risk    Time 3   Period Weeks   Status New   PT SHORT TERM GOAL #3   Title Patient to report he has been successfully able to attend his grandchidlren's baseball games with LRAD and minimal unsteadiness or fatigue in order to demonstrate improved level of function in community    Time 3   Period Weeks   Status New   PT SHORT TERM GOAL #4   Title Patient to be indepedent  in correctly and consistently performing appropriate HEP, to be updated PRN    Time 3   Period Weeks   Status New           PT Long Term Goals - 08/21/15 1617    PT LONG TERM GOAL #1   Title Patient will demonstrate strength at least 4+/5 in all tested muscle groups in order to improve general mobility, reduce pain, and improve regional stability    Time 6   Period Weeks   Status New   PT LONG TERM GOAL #2   Title Patient to be unlimited in gait with LRAD, no unsteadiness or fatigue, over even and uneven surfaces, in order to assist in returning to PLOF    Time 6   Period Weeks   Status New   PT LONG TERM GOAL #3   Title Patient to report he has been able to return to yardwork with no falls and pain L hip no more than 2/10 in order to assist in returning to PLOF    Time 6   Status New   PT LONG TERM GOAL #4   Title Patient to be able to reciprocally ascend and descend full flight of stairs with one railing, minimal unsteadiness or fatigue, in order to demonstrate improved mobility at home and in community    Time 6   Period Weeks   Status New               Plan - 08/23/15 1819    Clinical Impression Statement PT tx focused on THA protocol strengthening ther ex as documented, gait training with FWW, pt education, and weight shifting. Initiated PT tx with gait training indoors on level terrain around the clinic with focus on upright posture and heel to toe sequence. Adjusted the pt's FWW to appropriate height and front wheels were switched to  improve clearance through doorways. Improved gait pattern demo once cues provided. Pt was able to demo a step through, continuous gait pattern. Added and completed quad sets, glute sets, and assisted heel slides of the L LE. Good tolerance reported with added ther ex. Added glute and quad sets to HEP with excellent understanding demo by pt. Added weight shifting on airex pad in A<>P and M<>L direction with B UE support on parallel bars with fair to good weight shifting assessed. Reviewed current HEP with fair recall demo. Verbal cues required for proper technique and parameters with standing ther ex. Pt tolerated PT tx well with L hip pain rated a 3-4/10 on a VAS at the completion of PT tx. Continue with current POC and progress per post op protocol.    Pt will benefit from skilled therapeutic intervention in order to improve on the following deficits Abnormal gait;Cardiopulmonary status limiting activity;Decreased activity tolerance;Decreased strength;Pain;Decreased balance;Decreased mobility;Difficulty walking;Improper body mechanics;Decreased coordination;Postural dysfunction   Rehab Potential Good   PT Frequency 3x / week   PT Duration 6 weeks   PT Treatment/Interventions ADLs/Self Care Home Management;Cryotherapy;Electrical Stimulation;DME Instruction;Gait training;Stair training;Functional mobility training;Therapeutic activities;Therapeutic exercise;Balance training;Neuromuscular re-education;Patient/family education;Manual techniques;Passive range of motion;Energy conservation;Taping   PT Next Visit Plan review HEP and goals; functional stretching and strengthening    PT Home Exercise Plan Reviewed initial HEP with addition of glute/quad sets   Consulted and Agree with Plan of Care Patient        Problem List Patient Active Problem List   Diagnosis Date Noted  . Osteoarthritis of right hip 08/10/2015  . Status post total replacement of left  hip 08/10/2015  . Uncontrolled type 2 diabetes  mellitus (HCC) 04/28/2015  . Primary hypothyroidism 04/28/2015  . Hyperlipidemia 04/28/2015  . Essential hypertension, benign 04/28/2015  . 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 Varicose vein 01/24/2012  . RLS (restless legs syndrome) 07/11/2011  . Seroma 07/07/2011  . Deep venous thrombosis (HCC)   . History of endocarditis   . Coronary artery disease (CAD) excluded   . Extrasystole 04/30/2011  . S/P AVR (aortic valve replacement) 04/10/2011  . S/P mitral valve repair 04/10/2011  . Mitral regurgitation 03/29/2011  . Aortic insufficiency   . Dyspnea 11/15/2010  . ENDOCARDITIS 03/20/2010  . SHORTNESS OF BREATH 07/21/2009  . OVERWEIGHT/OBESITY 02/20/2009  . DEPRESSION 02/20/2009  . Obstructive sleep apnea 02/20/2009  . Essential hypertension 02/20/2009  . SKIN LESION 02/20/2009  . CHEST PAIN-UNSPECIFIED 02/20/2009  . TRAUMATIC COMPARTMENT SYNDROME LOWER EXTREMITY 02/20/2009    Bonnee Quin, PT, DPT  08/23/2015, 6:40 PM  [facility] [address] [place], [place], [address] Phone: [phone]   Fax:  [phone]  Name: [name] [name] MRN: [id number] Date of Birth: Nov 02, 1950

## 2015-08-25 ENCOUNTER — Ambulatory Visit (HOSPITAL_COMMUNITY): Payer: Medicare Other

## 2015-08-25 DIAGNOSIS — R2681 Unsteadiness on feet: Secondary | ICD-10-CM

## 2015-08-25 DIAGNOSIS — Z96642 Presence of left artificial hip joint: Secondary | ICD-10-CM | POA: Diagnosis not present

## 2015-08-25 DIAGNOSIS — Z7409 Other reduced mobility: Secondary | ICD-10-CM

## 2015-08-25 DIAGNOSIS — M6281 Muscle weakness (generalized): Secondary | ICD-10-CM

## 2015-08-25 DIAGNOSIS — R262 Difficulty in walking, not elsewhere classified: Secondary | ICD-10-CM

## 2015-08-25 NOTE — Therapy (Signed)
[facility] [address] [place], [place], [address] Phone: [phone]   Fax:  [phone]  Physical Therapy Treatment  Patient Details  Name: [name] [name] MRN: [id number] Date of Birth: February 28, 1951 Referring Provider: Doneen Poisson   Encounter Date: 08/25/2015      PT End of Session - 08/25/15 1733    Visit Number 3   Number of Visits 18   Date for PT Re-Evaluation 09/18/15   Authorization Type [employer] Medicare PPO    Authorization Time Period 08/21/15 to 10/02/15   Authorization - Visit Number 3   Authorization - Number of Visits 10   PT Start Time 1643   PT Stop Time 1730   PT Time Calculation (min) 47 min   Equipment Utilized During Treatment Gait belt   Activity Tolerance Patient tolerated treatment well   Behavior During Therapy [facility] for tasks assessed/performed      Past Medical History  Diagnosis Date  . Obesity   . Hypertension   . Depression   . 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 Diabetes mellitus   . Hyperlipidemia   . Coronary artery disease (CAD) excluded 2008, 2011    Mild coronary plaque.  w/ normal LV function. repeated cardiac cath on 07/2009  . 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 History of endocarditis     status post Enterococcus faecalis, secondary to chronic injections with low molecular weight heparin  . Deep venous thrombosis (HCC)     recurrent DVT and PE, status post IVC filter  . OSA (obstructive sleep apnea)     CPAP  . CHF (congestive heart failure) (HCC)   . Dyspnea     with exertion  . GERD (gastroesophageal reflux disease)   . Arthritis   . Restless leg syndrome     Past Surgical History  Procedure Laterality Date  . Thyroidectomy    . Mitral valve repair  04/10/2011    autologous pericardial patch augmentation of posterior  leaflet with 28mm Sorin Memo 3D ring annuloplasty  . Aortic valve replacement  04/10/2011    25mm [facility] Ease pericardial bioprosthetic aortic valve  . Cardiac catheterization  2008  . Cardiac catheterization  07/2009    minor irregulreties without obstructive disease  . Colonoscopy w/ polypectomy    . Ivc filter placement ([facility] hx)    . Total hip arthroplasty Left 08/10/2015    Procedure: LEFT TOTAL HIP ARTHROPLASTY ANTERIOR APPROACH;  Surgeon: Kathryne Hitch, MD;  Location: [facility] OR;  Service: Orthopedics;  Laterality: Left;    There were no vitals filed for this visit.  Visit Diagnosis:  History of hip replacement, total, left  Unsteadiness  Impaired functional mobility and activity tolerance  Muscle weakness  Difficulty walking      Subjective Assessment - 08/25/15 1652    Subjective Pt stated current pain scale 5/10, has been doing a lot of walking and reports compliance with HEP daily.   Pertinent History HTN, mitral regurgitation, hx of DVT but does have IVC filter, OSA, DM, hypothyroidsm, overweight/obesity, CAD    Patient Stated Goals get back to baseline- get outside, watch grandkids play baseball, get on riding lawnmower    Currently in Pain? Yes   Pain Score 5    Pain Location Hip   Pain Orientation Left   Pain Descriptors /  Indicators Sore   Pain Type Acute pain;Surgical pain   Pain Radiating Towards radiating down to foot    Pain Onset In the past 7 days   Pain Frequency Constant   Aggravating Factors  Walking, steps, and sitting for prolonged periods of time   Pain Relieving Factors pain meds, ice, rest, and change in position   Effect of Pain on Daily Activities Limited with long distance ambulation, household chores, and yard work            [facility] Treatment/Exercise - 08/25/15 0001    Ambulation/Gait   Ambulation/Gait Yes   Ambulation/Gait Assistance 5: Supervision   Ambulation/Gait Assistance Details --  cueing for posture    Ambulation Distance (Feet) 226 Feet   Assistive device Rolling walker   Gait Pattern Step-through pattern;Antalgic;Trunk flexed   Gait Comments Gait training with FWW in clinic with focus on postural alignment and heel to toe sequence    Knee/Hip Exercises: Standing   Heel Raises Both;15 reps   Heel Raises Limitations toe raises 15x   Knee Flexion Both;15 reps   Hip Abduction Both;15 reps   Abduction Limitations cueing for form   Functional Squat 1 set;10 reps   Rocker Board 2 minutes   Rocker Board Limitations R/L with HHA   SLS 1x 30" with HHA   Other Standing Knee Exercises weight shifting R/L with no HHA   Other Standing Knee Exercises All standing ther ex completed with close sup and UE support on walker   Knee/Hip Exercises: Supine   Quad Sets Both;1 set;15 reps   Quad Sets Limitations 5" holds   Heel Slides Left;1 set;15 reps;AROM   Hip Adduction Isometric Both;10 reps  cueing to keep pelvis neutral             PT Short Term Goals - 08/21/15 1614    PT SHORT TERM GOAL #1   Title Patient will be able to ambulate at least 439ft during with LRAD, improved gait mechanics including equal stance/step times, equal weight bearing, minimal unsteadiness in order to demonstrate improved general mobility    Time 3   Period Weeks   Status New   PT SHORT TERM GOAL #2   Title Patient to be able to complete TUG in 15 seconds or less with LRAD in order to demonstrate improved general mobility and reduced fall risk    Time 3   Period Weeks   Status New   PT SHORT TERM GOAL #3   Title Patient to report he has been successfully able to attend his grandchidlren's baseball games with LRAD and minimal unsteadiness or fatigue in order to demonstrate improved level of function in community    Time 3   Period Weeks   Status New   PT SHORT TERM GOAL #4   Title Patient to be indepedent in correctly and consistently performing appropriate HEP, to be updated PRN    Time 3   Period  Weeks   Status New           PT Long Term Goals - 08/21/15 1617    PT LONG TERM GOAL #1   Title Patient will demonstrate strength at least 4+/5 in all tested muscle groups in order to improve general mobility, reduce pain, and improve regional stability    Time 6   Period Weeks   Status New   PT LONG TERM GOAL #2   Title Patient to be unlimited in gait with LRAD, no unsteadiness or  fatigue, over even and uneven surfaces, in order to assist in returning to PLOF    Time 6   Period Weeks   Status New   PT LONG TERM GOAL #3   Title Patient to report he has been able to return to yardwork with no falls and pain L hip no more than 2/10 in order to assist in returning to PLOF    Time 6   Status New   PT LONG TERM GOAL #4   Title Patient to be able to reciprocally ascend and descend full flight of stairs with one railing, minimal unsteadiness or fatigue, in order to demonstrate improved mobility at home and in community    Time 6   Period Weeks   Status New               Plan - 08/25/15 1734    Clinical Impression Statement Reviewed goals, compliance wtih HEP to assure correct technique and copy of eval given to pt this session.  Began session with gait training with min cueing to increase BOS to assist with balance, heel to toe patten and posture to improve gait mechainics.  No LOB episodes noted during gait training.  Reviewed technique with HEP with good form and control noted with all HEP.  Progressed gluteal sets to bridges for strengthening.  Progressed weight bearing activities for functional strengtheniing including rockerboard for weight distribution with gait mechanics, lunges for functional strengthening and began SLS with UE A to improve hip stabiltywith balance.  No reports of increased pain through session.  End of session pt reports pain reduced to 4/10 soreness.  Encouraged pt to apply ice  for pain and edema control at home.     Pt will benefit from skilled  therapeutic intervention in order to improve on the following deficits Abnormal gait;Cardiopulmonary status limiting activity;Decreased activity tolerance;Decreased strength;Pain;Decreased balance;Decreased mobility;Difficulty walking;Improper body mechanics;Decreased coordination;Postural dysfunction   Rehab Potential Good   PT Frequency 3x / week   PT Duration 6 weeks   PT Treatment/Interventions ADLs/Self Care Home Management;Cryotherapy;Electrical Stimulation;DME Instruction;Gait training;Stair training;Functional mobility training;Therapeutic activities;Therapeutic exercise;Balance training;Neuromuscular re-education;Patient/family education;Manual techniques;Passive range of motion;Energy conservation;Taping   PT Next Visit Plan Begin SLR and continue proximal musculature strengthening.  Progress weight bearing activities for functional strengthening and stretches to reduce soreness.     PT Home Exercise Plan no additional HEP given this session.  Continue to give advanced HEP when approriate.          Problem List Patient Active Problem List   Diagnosis Date Noted  . Osteoarthritis of right hip 08/10/2015  . 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 Uncontrolled type 2 diabetes mellitus (HCC) 04/28/2015  . Primary hypothyroidism 04/28/2015  . Hyperlipidemia 04/28/2015  . Essential hypertension, benign 04/28/2015  . 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 Varicose vein 01/24/2012  . RLS (restless legs syndrome) 07/11/2011  . Seroma 07/07/2011  . Deep venous thrombosis (HCC)   . History of endocarditis   . Coronary artery disease (CAD) excluded   . Extrasystole 04/30/2011  . S/P AVR (aortic valve replacement) 04/10/2011  . S/P mitral valve repair 04/10/2011  . Mitral regurgitation 03/29/2011  . Aortic insufficiency   . Dyspnea 11/15/2010  . ENDOCARDITIS 03/20/2010  . SHORTNESS OF BREATH 07/21/2009  . OVERWEIGHT/OBESITY 02/20/2009  .  DEPRESSION 02/20/2009  . Obstructive sleep apnea 02/20/2009  . Essential hypertension 02/20/2009  . SKIN LESION 02/20/2009  . CHEST PAIN-UNSPECIFIED 02/20/2009  .  TRAUMATIC COMPARTMENT SYNDROME LOWER EXTREMITY 02/20/2009   Becky Sax, LPTA; CBIS [phone]  Juel Burrow 08/25/2015, 5:40 PM  [facility] [address] [place], [place], [address] Phone: [phone]   Fax:  [phone]  Name: [name] [name] MRN: [id number] Date of Birth: [birth year]

## 2015-08-30 ENCOUNTER — Telehealth (HOSPITAL_COMMUNITY): Payer: Self-pay | Admitting: Physical Therapy

## 2015-08-30 ENCOUNTER — Ambulatory Visit (HOSPITAL_COMMUNITY): Payer: Medicare Other | Admitting: Physical Therapy

## 2015-08-30 NOTE — Telephone Encounter (Signed)
He is sick and can not come in

## 2015-09-05 ENCOUNTER — Ambulatory Visit (HOSPITAL_COMMUNITY): Payer: Medicare Other | Admitting: Physical Therapy

## 2015-09-05 DIAGNOSIS — Z7409 Other reduced mobility: Secondary | ICD-10-CM

## 2015-09-05 DIAGNOSIS — R2681 Unsteadiness on feet: Secondary | ICD-10-CM

## 2015-09-05 DIAGNOSIS — Z96642 Presence of left artificial hip joint: Secondary | ICD-10-CM | POA: Diagnosis not present

## 2015-09-05 DIAGNOSIS — R262 Difficulty in walking, not elsewhere classified: Secondary | ICD-10-CM

## 2015-09-05 DIAGNOSIS — M6281 Muscle weakness (generalized): Secondary | ICD-10-CM

## 2015-09-05 NOTE — Therapy (Signed)
[facility] [address] [place], [place], [address] Phone: [phone]   Fax:  [phone]  Physical Therapy Treatment  Patient Details  Name: [name] [name] MRN: [id number] Date of Birth: 11/11/1950 Referring Provider: Doneen Poissonhristopher Blackman   Encounter Date: 09/05/2015      PT End of Session - 09/05/15 1705    Visit Number 4   Number of Visits 18   Date for PT Re-Evaluation 09/18/15   Authorization Type U[employer] Medicare PPO    Authorization Time Period 08/21/15 to 10/02/15   Authorization - Visit Number 4   Authorization - Number of Visits 10   PT Start Time 1522   PT Stop Time 1605   PT Time Calculation (min) 43 min   Equipment Utilized During Treatment Gait belt   Activity Tolerance Patient tolerated treatment well   Behavior [facility] tasks assessed/performed      Past Medical History  Diagnosis Date  . Obesity   . Hypertension   . Depression   . 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 Diabetes mellitus   . Hyperlipidemia   . Coronary artery disease (CAD) excluded 2008, 2011    Mild coronary plaque.  w/ normal LV function. repeated cardiac cath on 07/2009  . 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 History of endocarditis     status post Enterococcus faecalis, secondary to chronic injections with low molecular weight heparin  . Deep venous thrombosis (HCC)     recurrent DVT and PE, status post IVC filter  . OSA (obstructive sleep apnea)     CPAP  . CHF (congestive heart failure) (HCC)   . Dyspnea     with exertion  . GERD (gastroesophageal reflux disease)   . Arthritis   . Restless leg syndrome     Past Surgical History  Procedure Laterality Date  . Thyroidectomy    . Mitral valve repair  04/10/2011    autologous pericardial patch augmentation of posterior  leaflet with 28mm Sorin Memo 3D ring annuloplasty  . Aortic valve replacement  04/10/2011    25mm [facility] Ease pericardial bioprosthetic aortic valve  . Cardiac catheterization  2008  . Cardiac catheterization  07/2009    minor irregulreties without obstructive disease  . Colonoscopy w/ polypectomy    . Ivc filter placement ([facility] hx)    . Total hip arthroplasty Left 08/10/2015    Procedure: LEFT TOTAL HIP ARTHROPLASTY ANTERIOR APPROACH;  Surgeon: Kathryne Hitchhristopher Y Blackman, MD;  [facility];  Service: Orthopedics;  Laterality: Left;    There were no vitals filed for this visit.  Visit Diagnosis:  History of hip replacement, total, left  Unsteadiness  Impaired functional mobility and activity tolerance  Muscle weakness  Difficulty walking      Subjective Assessment - 09/05/15 1701    Subjective Pt states he returned to his surgeon yesterday and was very pleased with his progress.  Currently without pain, only soreness.  States he was having some nerve pain down his Rt LE but MD gave him some pills and it went away.  Reports compliance with HEP.  Comes today using a SPC and states he uses no AD at times in his house.    Currently in Pain? No/denies  [facility] Adult PT Treatment/Exercise - 09/05/15 0001    Knee/Hip Exercises: Standing   Heel Raises Both;15 reps   Heel Raises Limitations toe raises 15x   Knee Flexion Both;15 reps   Hip Abduction Both;15 reps   Abduction Limitations cueing for form   Gait Training with SPC   Knee/Hip Exercises: Supine   Quad Sets Both;1 set;20 reps   Quad Sets Limitations 5" holds   Bridges Limitations 15reps                  PT Short Term Goals - 08/21/15 1614    PT SHORT TERM GOAL #1   Title Patient will be able to ambulate at least 432ft during with LRAD, improved gait mechanics including equal stance/step times, equal weight bearing, minimal unsteadiness in order to demonstrate  improved general mobility    Time 3   Period Weeks   Status New   PT SHORT TERM GOAL #2   Title Patient to be able to complete TUG in 15 seconds or less with LRAD in order to demonstrate improved general mobility and reduced fall risk    Time 3   Period Weeks   Status New   PT SHORT TERM GOAL #3   Title Patient to report he has been successfully able to attend his grandchidlren's baseball games with LRAD and minimal unsteadiness or fatigue in order to demonstrate improved level of function in community    Time 3   Period Weeks   Status New   PT SHORT TERM GOAL #4   Title Patient to be indepedent in correctly and consistently performing appropriate HEP, to be updated PRN    Time 3   Period Weeks   Status New           PT Long Term Goals - 08/21/15 1617    PT LONG TERM GOAL #1   Title Patient will demonstrate strength at least 4+/5 in all tested muscle groups in order to improve general mobility, reduce pain, and improve regional stability    Time 6   Period Weeks   Status New   PT LONG TERM GOAL #2   Title Patient to be unlimited in gait with LRAD, no unsteadiness or fatigue, over even and uneven surfaces, in order to assist in returning to PLOF    Time 6   Period Weeks   Status New   PT LONG TERM GOAL #3   Title Patient to report he has been able to return to yardwork with no falls and pain L hip no more than 2/10 in order to assist in returning to PLOF    Time 6   Status New   PT LONG TERM GOAL #4   Title Patient to be able to reciprocally ascend and descend full flight of stairs with one railing, minimal unsteadiness or fatigue, in order to demonstrate improved mobility at home and in community    Time 6   Period Weeks   Status New               Plan - 09/05/15 1706    Clinical Impression Statement Pt missed last weeks appointments due to illness.  Pt is returning overall improved with reduction to [facility].  Pt presents with good form and correct use of AD.  Resumed therex as added last session and reveiwed HEP. Added supine SLR.  Required verbal and tactile cues for hip abduction form and posturing.   Pt able to complete HEP correctly and  independently.  Pt reported no pain at end of session.   Pt will benefit from skilled therapeutic intervention in order to improve on the following deficits Abnormal gait;Cardiopulmonary status limiting activity;Decreased activity tolerance;Decreased strength;Pain;Decreased balance;Decreased mobility;Difficulty walking;Improper body mechanics;Decreased coordination;Postural dysfunction   Rehab Potential Good   PT Frequency 3x / week   PT Duration 6 weeks   PT Treatment/Interventions ADLs/Self Care Home Management;Cryotherapy;Electrical Stimulation;DME Instruction;Gait training;Stair training;Functional mobility training;Therapeutic activities;Therapeutic exercise;Balance training;Neuromuscular re-education;Patient/family education;Manual techniques;Passive range of motion;Energy conservation;Taping   PT Next Visit Plan Progress weight bearing activities for functional strengthening and stretches to reduce soreness.     PT Home Exercise Plan no additional HEP given this session.  Continue to give advanced HEP when approriate.          Problem List Patient Active Problem List   Diagnosis Date Noted  . Osteoarthritis of right hip 08/10/2015  . 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 Uncontrolled type 2 diabetes mellitus (HCC) 04/28/2015  . Primary hypothyroidism 04/28/2015  . Hyperlipidemia 04/28/2015  . Essential hypertension, benign 04/28/2015  . 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 Varicose vein 01/24/2012  . RLS (restless legs syndrome) 07/11/2011  . Seroma 07/07/2011  . Deep venous thrombosis (HCC)   . History of endocarditis   . Coronary artery disease (CAD) excluded   . Extrasystole 04/30/2011  . S/P AVR (aortic valve replacement) 04/10/2011  . S/P mitral  valve repair 04/10/2011  . Mitral regurgitation 03/29/2011  . Aortic insufficiency   . Dyspnea 11/15/2010  . ENDOCARDITIS 03/20/2010  . SHORTNESS OF BREATH 07/21/2009  . OVERWEIGHT/OBESITY 02/20/2009  . DEPRESSION 02/20/2009  . Obstructive sleep apnea 02/20/2009  . Essential hypertension 02/20/2009  . SKIN LESION 02/20/2009  . CHEST PAIN-UNSPECIFIED 02/20/2009  . TRAUMATIC COMPARTMENT SYNDROME LOWER EXTREMITY 02/20/2009    Lurena Nida, PTA/CLT [phone]  09/05/2015, 5:18 PM  [facility] [address] [place], [place], [address] Phone: [phone]   Fax:  [phone]  Name: YERACHMIEL SPINNEY MRN: [id number] Date of Birth: [birth year]

## 2015-09-06 ENCOUNTER — Ambulatory Visit (HOSPITAL_COMMUNITY): Payer: Medicare Other | Admitting: Physical Therapy

## 2015-09-08 ENCOUNTER — Ambulatory Visit (HOSPITAL_COMMUNITY): Payer: Medicare Other

## 2015-09-08 DIAGNOSIS — Z96642 Presence of left artificial hip joint: Secondary | ICD-10-CM

## 2015-09-08 DIAGNOSIS — Z7409 Other reduced mobility: Secondary | ICD-10-CM

## 2015-09-08 DIAGNOSIS — M6281 Muscle weakness (generalized): Secondary | ICD-10-CM

## 2015-09-08 DIAGNOSIS — R2681 Unsteadiness on feet: Secondary | ICD-10-CM

## 2015-09-08 DIAGNOSIS — R262 Difficulty in walking, not elsewhere classified: Secondary | ICD-10-CM

## 2015-09-08 NOTE — Therapy (Signed)
[facility] [address] [place], [place], [address] Phone: [phone]   Fax:  [phone]  Physical Therapy Treatment  Patient Details  Name: [name] [name] MRN: [id number] Date of Birth: December 27, 1950 Referring Provider: Doneen Poisson   Encounter Date: 09/08/2015      PT End of Session - 09/08/15 1548    Visit Number 5   Number of Visits 18   Date for PT Re-Evaluation 09/18/15   Authorization Type [employer] Medicare PPO    Authorization Time Period 08/21/15 to 10/02/15   Authorization - Visit Number 5   Authorization - Number of Visits 10   PT Start Time 1528   PT Stop Time 1606   PT Time Calculation (min) 38 min   Equipment Utilized During Treatment Gait belt   Activity Tolerance Patient tolerated treatment well   Behavior During Therapy [facility] for tasks assessed/performed      Past Medical History  Diagnosis Date  . Obesity   . Hypertension   . Depression   . 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 Diabetes mellitus   . Hyperlipidemia   . Coronary artery disease (CAD) excluded 2008, 2011    Mild coronary plaque.  w/ normal LV function. repeated cardiac cath on 07/2009  . 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 History of endocarditis     status post Enterococcus faecalis, secondary to chronic injections with low molecular weight heparin  . Deep venous thrombosis (HCC)     recurrent DVT and PE, status post IVC filter  . OSA (obstructive sleep apnea)     CPAP  . CHF (congestive heart failure) (HCC)   . Dyspnea     with exertion  . GERD (gastroesophageal reflux disease)   . Arthritis   . Restless leg syndrome     Past Surgical History  Procedure Laterality Date  . Thyroidectomy    . Mitral valve repair  04/10/2011    autologous pericardial patch augmentation of posterior  leaflet with 28mm Sorin Memo 3D ring annuloplasty  . Aortic valve replacement  04/10/2011    25mm [facility] Ease pericardial bioprosthetic aortic valve  . Cardiac catheterization  2008  . Cardiac catheterization  07/2009    minor irregulreties without obstructive disease  . Colonoscopy w/ polypectomy    . Ivc filter placement ([facility] hx)    . Total hip arthroplasty Left 08/10/2015    Procedure: LEFT TOTAL HIP ARTHROPLASTY ANTERIOR APPROACH;  Surgeon: Kathryne Hitch, MD;  Location: [facility] OR;  Service: Orthopedics;  Laterality: Left;    There were no vitals filed for this visit.  Visit Diagnosis:  History of hip replacement, total, left  Unsteadiness  Impaired functional mobility and activity tolerance  Muscle weakness  Difficulty walking      Subjective Assessment - 09/08/15 1524    Subjective Pt entered dept ambulating with SPC, reports he does some walking around around the house without AD.  Reports Lt hip feels good today, stated increased pain Rt thigh   Pertinent History HTN, mitral regurgitation, hx of DVT but does have IVC filter, OSA, DM, hypothyroidsm, overweight/obesity, CAD    Patient Stated Goals get back to baseline- get outside, watch grandkids play baseball, get on riding lawnmower    Currently in Pain? Yes   Pain Score 6    Pain Location  Leg  Rt thigh   Pain Orientation Right;Proximal;Anterior   Pain Descriptors / Indicators Aching;Sore;Tender   Pain Type Acute pain;Surgical pain   Pain Radiating Towards radiating down to foot   Pain Onset 1 to 4 weeks ago   Pain Frequency Intermittent   Aggravating Factors  Walking, steps, and sitting for prolonged periods of time   Pain Relieving Factors pain meds, ice, rest, and change in position   Effect of Pain on Daily Activities Limited with long distance ambulation, household chores, and yard work             [facility] Adult PT Treatment/Exercise - 09/08/15 0001    Ambulation/Gait   Ambulation/Gait Yes    Ambulation/Gait Assistance 5: Supervision   Ambulation Distance (Feet) 226 Feet   Assistive device Straight cane   Gait Pattern Step-through pattern;Antalgic;Trunk flexed   Knee/Hip Exercises: Stretches   Theme park manager 3 reps;30 seconds   Gastroc Stretch Limitations slant board   Knee/Hip Exercises: Standing   Heel Raises Both;15 reps   Heel Raises Limitations toe raises 15x   Hip Abduction Both;15 reps   Abduction Limitations cueing for form   Lateral Step Up Both;10 reps;Hand Hold: 2;Step Height: 4"   Forward Step Up Left;10 reps;Hand Hold: 2;Step Height: 4"   Functional Squat 1 set;10 reps   Rocker Board 2 minutes   Rocker Board Limitations R/L and A/P   SLS 4" max Bil, 2x 30" with HHA   Gait Training with SPC   Knee/Hip Exercises: Seated   Sit to Sand 10 reps;without UE support              PT Short Term Goals - 08/21/15 1614    PT SHORT TERM GOAL #1   Title Patient will be able to ambulate at least 422ft during with LRAD, improved gait mechanics including equal stance/step times, equal weight bearing, minimal unsteadiness in order to demonstrate improved general mobility    Time 3   Period Weeks   Status New   PT SHORT TERM GOAL #2   Title Patient to be able to complete TUG in 15 seconds or less with LRAD in order to demonstrate improved general mobility and reduced fall risk    Time 3   Period Weeks   Status New   PT SHORT TERM GOAL #3   Title Patient to report he has been successfully able to attend his grandchidlren's baseball games with LRAD and minimal unsteadiness or fatigue in order to demonstrate improved level of function in community    Time 3   Period Weeks   Status New   PT SHORT TERM GOAL #4   Title Patient to be indepedent in correctly and consistently performing appropriate HEP, to be updated PRN    Time 3   Period Weeks   Status New           PT Long Term Goals - 08/21/15 1617    PT LONG TERM GOAL #1   Title Patient will  demonstrate strength at least 4+/5 in all tested muscle groups in order to improve general mobility, reduce pain, and improve regional stability    Time 6   Period Weeks   Status New   PT LONG TERM GOAL #2   Title Patient to be unlimited in gait with LRAD, no unsteadiness or fatigue, over even and uneven surfaces, in order to assist in returning to PLOF    Time 6   Period Weeks   Status New  PT LONG TERM GOAL #3   Title Patient to report he has been able to return to yardwork with no falls and pain L hip no more than 2/10 in order to assist in returning to PLOF    Time 6   Status New   PT LONG TERM GOAL #4   Title Patient to be able to reciprocally ascend and descend full flight of stairs with one railing, minimal unsteadiness or fatigue, in order to demonstrate improved mobility at home and in community    Time 6   Period Weeks   Status New               Plan - 09/08/15 1632    Clinical Impression Statement Pt reports compliance with HEP with mat actvities.  Pt demonstrated iimproved safe gait mechanics with SPC with minimal cueing to improve stance phase.  Pt educated on importance of equalizing stance phase during gait to reduce pain Rt LE.  Progressed weight bearing activities for functional strengthenning and stretches to reduce soreness following therex.  Began lateral and forward step up training with minimal cueing for proper form and techniuqe.  End of session pt reports Rt thigh pain reduced to 3/10, no reports of hip pain through session.   Pt will benefit from skilled therapeutic intervention in order to improve on the following deficits Abnormal gait;Cardiopulmonary status limiting activity;Decreased activity tolerance;Decreased strength;Pain;Decreased balance;Decreased mobility;Difficulty walking;Improper body mechanics;Decreased coordination;Postural dysfunction   PT Frequency 3x / week   PT Duration 6 weeks   PT Treatment/Interventions ADLs/Self Care Home  Management;Cryotherapy;Electrical Stimulation;DME Instruction;Gait training;Stair training;Functional mobility training;Therapeutic activities;Therapeutic exercise;Balance training;Neuromuscular re-education;Patient/family education;Manual techniques;Passive range of motion;Energy conservation;Taping   PT Next Visit Plan Progress weight bearing activities for functional strengthening and stretches to reduce soreness.     PT Home Exercise Plan no additional HEP given this session.  Continue to give advanced HEP when approriate.          Problem List Patient Active Problem List   Diagnosis Date Noted  . Osteoarthritis of right hip 08/10/2015  . 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 Uncontrolled type 2 diabetes mellitus (HCC) 04/28/2015  . Primary hypothyroidism 04/28/2015  . Hyperlipidemia 04/28/2015  . Essential hypertension, benign 04/28/2015  . 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 Varicose vein 01/24/2012  . RLS (restless legs syndrome) 07/11/2011  . Seroma 07/07/2011  . Deep venous thrombosis (HCC)   . History of endocarditis   . Coronary artery disease (CAD) excluded   . Extrasystole 04/30/2011  . S/P AVR (aortic valve replacement) 04/10/2011  . S/P mitral valve repair 04/10/2011  . Mitral regurgitation 03/29/2011  . Aortic insufficiency   . Dyspnea 11/15/2010  . ENDOCARDITIS 03/20/2010  . SHORTNESS OF BREATH 07/21/2009  . OVERWEIGHT/OBESITY 02/20/2009  . DEPRESSION 02/20/2009  . Obstructive sleep apnea 02/20/2009  . Essential hypertension 02/20/2009  . SKIN LESION 02/20/2009  . CHEST PAIN-UNSPECIFIED 02/20/2009  . TRAUMATIC COMPARTMENT SYNDROME LOWER EXTREMITY 02/20/2009   Becky Saxasey Filomena Pokorney, LPTA; CBIS [phone]  Juel BurrowCockerham, Arnitra Sokoloski Jo 09/08/2015, 5:00 PM  [facility] [address] [place], [place], [address] Phone: [phone]   Fax:  [phone]  Name: Lysle PearlSteven C Mapes MRN:  [id number] Date of Birth: 11/30/1950

## 2015-09-11 ENCOUNTER — Ambulatory Visit (HOSPITAL_COMMUNITY): Payer: Medicare Other | Admitting: Physical Therapy

## 2015-09-11 ENCOUNTER — Telehealth (HOSPITAL_COMMUNITY): Payer: Self-pay

## 2015-09-11 NOTE — Telephone Encounter (Signed)
Patient have to work and will not be abl to come until Friday 

## 2015-09-13 ENCOUNTER — Ambulatory Visit: Payer: Self-pay | Admitting: Orthopedic Surgery

## 2015-09-13 ENCOUNTER — Ambulatory Visit (HOSPITAL_COMMUNITY): Payer: Medicare Other | Admitting: Physical Therapy

## 2015-09-13 ENCOUNTER — Telehealth (HOSPITAL_COMMUNITY): Payer: Self-pay | Admitting: Physical Therapy

## 2015-09-13 NOTE — Progress Notes (Signed)
Preoperative surgical orders have been place into the Epic hospital system for [name] [name] on 09/13/2015, 11:08 AM  by Patrica DuelPERKINS, ALEXZANDREW for surgery on 09/27/15.  Preop Total Hip - Anterior Approach orders including IV Tylenol, and IV Decadron as long as there are no contraindications to the above medications. Avel Peacerew Perkins, PA-C

## 2015-09-13 NOTE — Telephone Encounter (Signed)
[place] 1[place]17 pm, will cx today and see us on Friday. NF

## 2015-09-15 ENCOUNTER — Ambulatory Visit (HOSPITAL_COMMUNITY): Payer: Medicare Other

## 2015-09-15 DIAGNOSIS — Z7409 Other reduced mobility: Secondary | ICD-10-CM

## 2015-09-15 DIAGNOSIS — R262 Difficulty in walking, not elsewhere classified: Secondary | ICD-10-CM

## 2015-09-15 DIAGNOSIS — R2681 Unsteadiness on feet: Secondary | ICD-10-CM

## 2015-09-15 DIAGNOSIS — M6281 Muscle weakness (generalized): Secondary | ICD-10-CM

## 2015-09-15 DIAGNOSIS — Z96642 Presence of left artificial hip joint: Secondary | ICD-10-CM | POA: Diagnosis not present

## 2015-09-15 NOTE — Therapy (Signed)
[facility] [address] [place], [place], [address] Phone: [phone]   Fax:  [phone]  Physical Therapy Treatment  Patient Details  Name: [name] [name] MRN: [id number] Date of Birth: 05/30/1951 Referring Provider: Doneen Poissonhristopher Blackman   Encounter Date: 09/15/2015      PT End of Session - 09/15/15 1613    Visit Number 6   Number of Visits 18   Date for PT Re-Evaluation 09/18/15   Authorization Type UHC Medicare PPO    Authorization Time Period 08/21/15 to 10/02/15   Authorization - Visit Number 6   Authorization - Number of Visits 10   PT Start Time 1603   PT Stop Time 1648   PT Time Calculation (min) 45 min   Equipment Utilized During Treatment Gait belt   Activity Tolerance Patient tolerated treatment well   [facility] tasks assessed/performed      Past Medical History  Diagnosis Date  . Obesity   . Hypertension   . Depression   . 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 Diabetes mellitus   . Hyperlipidemia   . Coronary artery disease (CAD) excluded 2008, 2011    Mild coronary plaque.  w/ normal LV function. repeated cardiac cath on 07/2009  . 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 History of endocarditis     status post Enterococcus faecalis, secondary to chronic injections with low molecular weight heparin  . Deep venous thrombosis (HCC)     recurrent DVT and PE, status post IVC filter  . OSA (obstructive sleep apnea)     CPAP  . CHF (congestive heart failure) (HCC)   . Dyspnea     with exertion  . GERD (gastroesophageal reflux disease)   . Arthritis   . Restless leg syndrome     Past Surgical History  Procedure Laterality Date  . Thyroidectomy    . Mitral valve repair  04/10/2011    autologous pericardial patch augmentation of posterior  leaflet with 28mm Sorin Memo 3D ring annuloplasty  . Aortic valve replacement  04/10/2011    [facility] pericardial bioprosthetic aortic valve  . Cardiac catheterization  2008  . Cardiac catheterization  07/2009    minor irregulreties without obstructive disease  . Colonoscopy w/ polypectomy    . Ivc filter placement ([facility] hx)    . Total hip arthroplasty Left 08/10/2015    Procedure: LEFT TOTAL HIP ARTHROPLASTY ANTERIOR APPROACH;  Surgeon: Kathryne Hitchhristopher Y Blackman, MD;  [facility];  Service: Orthopedics;  Laterality: Left;    There were no vitals filed for this visit.  Visit Diagnosis:  History of hip replacement, total, left  Unsteadiness  Impaired functional mobility and activity tolerance  Muscle weakness  Difficulty walking      Subjective Assessment - 09/15/15 1608    Subjective Pt went to MD Wednesday about Rt hip.  Lt hip feels great today just a little soreness in Rt hip today.     Pertinent History HTN, mitral regurgitation, hx of DVT but does have IVC filter, OSA, DM, hypothyroidsm, overweight/obesity, CAD    Patient Stated Goals get back to baseline- get outside, watch grandkids play baseball, get on riding lawnmower    Currently in Pain? No/denies   Pain Location Hip   Pain Orientation Right   Pain Descriptors /  Indicators Sore   Pain Type Surgical pain   Pain Radiating Towards no radicular symptoms today   Pain Onset 1 to 4 weeks ago   Pain Frequency Intermittent   Aggravating Factors  Walking, steps, and sitting for prolonged periods of time   Pain Relieving Factors pain meds, ice, rest, and change in position   Effect of Pain on Daily Activities Limited with long distance ambulation, household chores, and yard work             [facility] PT Treatment/Exercise - 09/15/15 0001    Knee/Hip Exercises: Standing   Heel Raises Both;15 reps   Heel Raises Limitations toe raises 15x   Hip Abduction Both;15 reps   Abduction Limitations cueing  for form   Lateral Step Up Both;15 reps;Hand Hold: 2;Step Height: 4";Step Height: 6"   Forward Step Up Both;15 reps;Hand Hold: 1;Step Height: 4";Step Height: 6"   Functional Squat 15 reps   Rocker Board 2 minutes   Rocker Board Limitations R/L and A/P   SLS Lt 9". Rt 4" max of 3   Other Standing Knee Exercises sidestep 2RT wtih RTB            PT Short Term Goals - 08/21/15 1614    PT SHORT TERM GOAL #1   Title Patient will be able to ambulate at least 436ft during with LRAD, improved gait mechanics including equal stance/step times, equal weight bearing, minimal unsteadiness in order to demonstrate improved general mobility    Time 3   Period Weeks   Status New   PT SHORT TERM GOAL #2   Title Patient to be able to complete TUG in 15 seconds or less with LRAD in order to demonstrate improved general mobility and reduced fall risk    Time 3   Period Weeks   Status New   PT SHORT TERM GOAL #3   Title Patient to report he has been successfully able to attend his grandchidlren's baseball games with LRAD and minimal unsteadiness or fatigue in order to demonstrate improved level of function in community    Time 3   Period Weeks   Status New   PT SHORT TERM GOAL #4   Title Patient to be indepedent in correctly and consistently performing appropriate HEP, to be updated PRN    Time 3   Period Weeks   Status New           PT Long Term Goals - 08/21/15 1617    PT LONG TERM GOAL #1   Title Patient will demonstrate strength at least 4+/5 in all tested muscle groups in order to improve general mobility, reduce pain, and improve regional stability    Time 6   Period Weeks   Status New   PT LONG TERM GOAL #2   Title Patient to be unlimited in gait with LRAD, no unsteadiness or fatigue, over even and uneven surfaces, in order to assist in returning to PLOF    Time 6   Period Weeks   Status New   PT LONG TERM GOAL #3   Title Patient to report he has been able to return to  yardwork with no falls and pain L hip no more than 2/10 in order to assist in returning to PLOF    Time 6   Status New   PT LONG TERM GOAL #4   Title Patient to be able to reciprocally ascend and descend full flight of stairs with one railing, minimal unsteadiness  or fatigue, in order to demonstrate improved mobility at home and in community    Time 6   Period Weeks   Status New               Plan - 09/15/15 1713    Clinical Impression Statement Progressed CKC stretches this session to improve functional strengthening.  Increased reps and height with stair training this session with min verbal cueing and demonstration to reduce compensation with new exercises.  Added glut med strengthening exercises with side stepping with resistance and continued SLS activities.  No reports of increased pain through session.  Pt did stated he has difficutly getting in and out of car and reciprocal stair training, continue to progress strengthening for increase ease with these activties.   Pt will benefit from skilled therapeutic intervention in order to improve on the following deficits Abnormal gait;Cardiopulmonary status limiting activity;Decreased activity tolerance;Decreased strength;Pain;Decreased balance;Decreased mobility;Difficulty walking;Improper body mechanics;Decreased coordination;Postural dysfunction   PT Frequency 3x / week   PT Duration 6 weeks   PT Treatment/Interventions ADLs/Self Care Home Management;Cryotherapy;Electrical Stimulation;DME Instruction;Gait training;Stair training;Functional mobility training;Therapeutic activities;Therapeutic exercise;Balance training;Neuromuscular re-education;Patient/family education;Manual techniques;Passive range of motion;Energy conservation;Taping   PT Next Visit Plan Progress weight bearing activities for functional strengthening and stretches to reduce soreness.  Next session begin training for increase ease getting in and out of car         Problem List Patient Active Problem List   Diagnosis Date Noted  . Osteoarthritis of right hip 08/10/2015  . 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 Uncontrolled type 2 diabetes mellitus (HCC) 04/28/2015  . Primary hypothyroidism 04/28/2015  . Hyperlipidemia 04/28/2015  . Essential hypertension, benign 04/28/2015  . 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 Varicose vein 01/24/2012  . RLS (restless legs syndrome) 07/11/2011  . Seroma 07/07/2011  . Deep venous thrombosis (HCC)   . History of endocarditis   . Coronary artery disease (CAD) excluded   . Extrasystole 04/30/2011  . S/P AVR (aortic valve replacement) 04/10/2011  . S/P mitral valve repair 04/10/2011  . Mitral regurgitation 03/29/2011  . Aortic insufficiency   . Dyspnea 11/15/2010  . ENDOCARDITIS 03/20/2010  . SHORTNESS OF BREATH 07/21/2009  . OVERWEIGHT/OBESITY 02/20/2009  . DEPRESSION 02/20/2009  . Obstructive sleep apnea 02/20/2009  . Essential hypertension 02/20/2009  . SKIN LESION 02/20/2009  . CHEST PAIN-UNSPECIFIED 02/20/2009  . TRAUMATIC COMPARTMENT SYNDROME LOWER EXTREMITY 02/20/2009   Becky Sax, LPTA; CBIS [phone]  Juel Burrow 09/15/2015, 5:22 PM  [facility] [address] [place], [place], [address] Phone: [phone]   Fax:  [phone]  Name: [name] [name] MRN: [id number] Date of Birth: [birth year]

## 2015-09-18 ENCOUNTER — Ambulatory Visit (HOSPITAL_COMMUNITY): Payer: Medicare Other | Admitting: Physical Therapy

## 2015-09-18 DIAGNOSIS — Z7409 Other reduced mobility: Secondary | ICD-10-CM

## 2015-09-18 DIAGNOSIS — R262 Difficulty in walking, not elsewhere classified: Secondary | ICD-10-CM

## 2015-09-18 DIAGNOSIS — R2681 Unsteadiness on feet: Secondary | ICD-10-CM

## 2015-09-18 DIAGNOSIS — Z96642 Presence of left artificial hip joint: Secondary | ICD-10-CM | POA: Diagnosis not present

## 2015-09-18 DIAGNOSIS — M6281 Muscle weakness (generalized): Secondary | ICD-10-CM

## 2015-09-18 NOTE — Therapy (Signed)
[place], [place], [address] Phone: [phone]   Fax:  [phone]  Physical Therapy Treatment (Re-Assessment)  Patient Details  Name: [name] [name] MRN: [id number] Date of Birth: January 30, 1951 Referring Provider: Jean Rosenthal   Encounter Date: 09/18/2015      PT End of Session - 09/18/15 1535    Visit Number 7   Number of Visits 18   Date for PT Re-Evaluation 10/16/15   Authorization Type [employer]HC Medicare PPO    Authorization Time Period 08/21/15 to 10/02/15   Authorization - Visit Number 7   Authorization - Number of Visits 17   PT Start Time 9935   PT Stop Time 1515   PT Time Calculation (min) 43 min   Activity Tolerance Patient tolerated treatment well   Behavior During Therapy [facility] for tasks assessed/performed      Past Medical History  Diagnosis Date  . Obesity   . Hypertension   . Depression   . 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 Diabetes mellitus   . Hyperlipidemia   . Coronary artery disease (CAD) excluded 2008, 2011    Mild coronary plaque.  w/ normal LV function. repeated cardiac cath on 07/2009  . 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 History of endocarditis     status post Enterococcus faecalis, secondary to chronic injections with low molecular weight heparin  . Deep venous thrombosis (HCC)     recurrent DVT and PE, status post IVC filter  . OSA (obstructive sleep apnea)     CPAP  . CHF (congestive heart failure) ([place])   . Dyspnea     with exertion  . GERD (gastroesophageal reflux disease)   . Arthritis   . Restless leg syndrome     Past Surgical History  Procedure Laterality Date  . Thyroidectomy    . Mitral valve repair  04/10/2011    autologous pericardial patch augmentation of posterior leaflet with 2m Sorin Memo 3D  ring annuloplasty  . Aortic valve replacement  04/10/2011    [facility] aortic valve  . Cardiac catheterization  2008  . Cardiac catheterization  07/2009    minor irregulreties without obstructive disease  . Colonoscopy w/ polypectomy    . Ivc filter placement ([facility] hx)    . Total hip arthroplasty Left 08/10/2015    Procedure: LEFT TOTAL HIP ARTHROPLASTY ANTERIOR APPROACH;  Surgeon: ChMcarthur RossettiMD;  [facility] Service: [facility]opedics;  Laterality: Left;    There were no vitals filed for this visit.  Visit Diagnosis:  History of hip replacement, total, left - Plan: PT plan of care cert/re-cert  Unsteadiness - Plan: PT plan of care cert/re-cert  Impaired functional mobility and activity tolerance - Plan: PT plan of care cert/re-cert  Muscle weakness - Plan: PT plan of care cert/re-cert  Difficulty walking - Plan: PT plan of care cert/re-cert      Subjective Assessment - 09/18/15 1436    Subjective Patient reports he is feeling a bit sluggish today, having quite a bit of pain in R hip but L hip is great; he is concerned about the pain in his R LE as it is very painful for him. Patient is going to see his MD regarding the pain in his R hip on Wednesday. He walked  outside in the yard a bit saturday and reports it went well.    Pertinent History HTN, mitral regurgitation, hx of DVT but does have IVC filter, OSA, DM, hypothyroidsm, overweight/obesity, CAD    How long can you sit comfortably? 3/27- 60 minutes    How long can you stand comfortably? 3/27- 20 minutes    How long can you walk comfortably? 3/27- has been going to mailbox and back with cane, maybe a couple hundred ffeet    Patient Stated Goals get back to baseline- get outside, watch grandkids play baseball, get on riding lawnmower    Currently in Pain? Yes   Pain Score 8    Pain Location Hip   Pain Orientation Right   Pain Descriptors / Indicators Aching   Pain Type  Chronic pain   Pain Radiating Towards down leg    Pain Onset 1 to 4 weeks ago   Pain Frequency Intermittent   Aggravating Factors  getting into and out of cars    Pain Relieving Factors resting in sitting    Effect of Pain on Daily Activities cannot walk long distances             [facility] PT Assessment - 09/18/15 0001    Observation/Other Assessments   Focus on Therapeutic Outcomes (FOTO)  34% limited    Strength   Right Hip Flexion 3/5   Right Hip ABduction 2+/5   Left Hip Flexion 2+/5   Left Hip ABduction 2+/5   Right Knee Extension 4/5   Left Knee Extension 3+/5   Right Ankle Dorsiflexion 5/5   Left Ankle Dorsiflexion 4+/5   6 minute walk test results    Aerobic Endurance Distance Walked 527   Endurance additional comments 6MWT; cane   Timed Up and Go Test   Normal TUG (seconds) 15.79   TUG Comments no device                              PT Education - 09/18/15 1535    Education provided Yes   Education Details progress with skilled PT servies, plan of care moving forward    Person(s) Educated Patient   Methods Explanation   Comprehension Verbalized understanding          PT Short Term Goals - 09/18/15 1505    PT SHORT TERM GOAL #1   Title Patient will be able to ambulate at least 423f during 3MWT with LRAD, improved gait mechanics including equal stance/step times, equal weight bearing, minimal unsteadiness in order to demonstrate improved general mobility    Baseline 3/27- did 6MWT today, improved gait mechanics    Time 3   Period Weeks   Status Partially Met   PT SHORT TERM GOAL #2   Title Patient to be able to complete TUG in 15 seconds or less with LRAD in order to demonstrate improved general mobility and reduced fall risk    Baseline 3/27- 15.3 seconds, no device    Time 3   Period Weeks   Status Achieved   PT SHORT TERM GOAL #3   Title Patient to report he has been successfully able to attend his grandchidlren's baseball games  with LRAD and minimal unsteadiness or fatigue in order to demonstrate improved level of function in community    Baseline 3/27- has not gone yet due to it being so cold out; may try going tomorrow    Time 3   Period Weeks  Status On-going   PT SHORT TERM GOAL #4   Title Patient to be indepedent in correctly and consistently performing appropriate HEP, to be updated PRN    Time 3   Period Weeks   Status Achieved           PT Long Term Goals - 09/18/15 1508    PT LONG TERM GOAL #1   Title Patient will demonstrate strength at least 4+/5 in all tested muscle groups in order to improve general mobility, reduce pain, and improve regional stability    Time 6   Period Weeks   Status On-going   PT LONG TERM GOAL #2   Title Patient to be unlimited in gait with LRAD, no unsteadiness or fatigue, over even and uneven surfaces, in order to assist in returning to PLOF    Baseline 3/27- will always be limited a bit due to medical status but is improving    Time 6   Period Weeks   Status On-going   PT LONG TERM GOAL #3   Title Patient to report he has been able to return to [place] with no falls and pain L hip no more than 2/10 in order to assist in returning to PLOF    Baseline 3/27- has not tried yardwork yet    Time 6   Period Weeks   Status On-going   PT LONG TERM GOAL #4   Title Patient to be able to reciprocally ascend and descend full flight of stairs with one railing, minimal unsteadiness or fatigue, in order to demonstrate improved mobility at home and in community    Baseline 3/27- still some weakness and antalgia but doing better    Time 6   Period Weeks   Status On-going               Plan - 09/18/15 1536    Clinical Impression Statement Re-assessment performed today. Patient shows improvement in gait mechanics and distance, balance, and general mobility; he does demonstrate some mild increases in strength however has only been coming for approximately 3 weeks and may  demonstrate improved functional strength as he continues with skilled PT services. Noted severe weakness of bilateral hip abductors which contribute to his balance concerns and difficulty with tasks such as stairs. Patient did have some concerns regarding his medications raising his blood sugar and was advised to speak to MD regarding this. AT this piont recommend contiuatin of skilled PT services in order to address remainnig functional limitations, reduce fall risk , and assist in reaching optimal level of function.    Pt will benefit from skilled therapeutic intervention in order to improve on the following deficits Abnormal gait;Cardiopulmonary status limiting activity;Decreased activity tolerance;Decreased strength;Pain;Decreased balance;Decreased mobility;Difficulty walking;Improper body mechanics;Decreased coordination;Postural dysfunction   Rehab Potential Good   Clinical Impairments Affecting Rehab Potential cardiopulmonary status impacting functional activity tolerance    PT Frequency 2x / week   PT Duration 4 weeks   PT Treatment/Interventions ADLs/Self Care Home Management;Cryotherapy;Electrical Stimulation;DME Instruction;Gait training;Stair training;Functional mobility training;Therapeutic activities;Therapeutic exercise;Balance training;Neuromuscular re-education;Patient/family education;Manual techniques;Passive range of motion;Energy conservation;Taping   PT Next Visit Plan Progress weight bearing activities for functional strengthening and stretches to reduce soreness.  Next session begin training for increase ease getting in and out of car. Increase focus on hip abductors.    PT Home Exercise Plan no additional HEP given this session.  Continue to give advanced HEP when approriate.     Consulted and Agree with Plan of Care Patient  G-Codes - 09/18/15 1545    Functional Assessment Tool Used FOTO 34% limited    Functional Limitation Mobility: Walking and moving around    Mobility: Walking and Moving Around Current Status [phone]) At least 20 percent but less than 40 percent impaired, limited or restricted   Mobility: Walking and Moving Around Goal Status [phone]) At least 20 percent but less than 40 percent impaired, limited or restricted      Problem List Patient Active Problem List   Diagnosis Date Noted  . Osteoarthritis of right hip 08/10/2015  . 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 Uncontrolled type 2 diabetes mellitus ([place]) 04/28/2015  . Primary hypothyroidism 04/28/2015  . Hyperlipidemia 04/28/2015  . Essential hypertension, benign 04/28/2015  . Type 2 diabetes mellitus with vascular disease ([place]) 11/02/2012  . Varicose vein 01/24/2012  . RLS (restless legs syndrome) 07/11/2011  . Seroma 07/07/2011  . Deep venous thrombosis ([place])   . History of endocarditis   . Coronary artery disease (CAD) excluded   . Extrasystole 04/30/2011  . S/P AVR (aortic valve replacement) 04/10/2011  . S/P mitral valve repair 04/10/2011  . Mitral regurgitation 03/29/2011  . Aortic insufficiency   . Dyspnea 11/15/2010  . ENDOCARDITIS 03/20/2010  . SHORTNESS OF BREATH 07/21/2009  . OVERWEIGHT/OBESITY 02/20/2009  . DEPRESSION 02/20/2009  . Obstructive sleep apnea 02/20/2009  . Essential hypertension 02/20/2009  . SKIN LESION 02/20/2009  . CHEST PAIN-UNSPECIFIED 02/20/2009  . TRAUMATIC COMPARTMENT SYNDROME LOWER EXTREMITY 02/20/2009    Deniece Ree PT, DPT [place] [address] [place], [place], [address] Phone: [phone]   Fax:  [phone]  Name: [name] [name] MRN: [id number] Date of Birth: 04-Jun-1951

## 2015-09-20 ENCOUNTER — Encounter (HOSPITAL_COMMUNITY): Payer: Medicare Other | Admitting: Physical Therapy

## 2015-09-25 ENCOUNTER — Encounter (HOSPITAL_COMMUNITY): Payer: Medicare Other | Admitting: Physical Therapy

## 2015-09-27 ENCOUNTER — Telehealth (HOSPITAL_COMMUNITY): Payer: Self-pay

## 2015-09-27 ENCOUNTER — Ambulatory Visit (HOSPITAL_COMMUNITY): Payer: Medicare Other | Attending: Orthopaedic Surgery

## 2015-09-27 ENCOUNTER — Encounter (HOSPITAL_COMMUNITY): Admission: RE | Payer: Self-pay | Source: Ambulatory Visit

## 2015-09-27 ENCOUNTER — Inpatient Hospital Stay (HOSPITAL_COMMUNITY): Admission: RE | Admit: 2015-09-27 | Payer: Medicare Other | Source: Ambulatory Visit | Admitting: Orthopedic Surgery

## 2015-09-27 SURGERY — ARTHROPLASTY, HIP, TOTAL, ANTERIOR APPROACH
Anesthesia: Choice | Laterality: Left

## 2015-09-27 NOTE — Telephone Encounter (Signed)
No show, called and left message for pt. about missed apt.  Middle of message phone ended saying memory full.  [address] Cockerham, LPTA; CBIS [phone]

## 2015-09-29 ENCOUNTER — Encounter (HOSPITAL_COMMUNITY): Payer: Medicare Other

## 2015-10-02 ENCOUNTER — Ambulatory Visit (HOSPITAL_COMMUNITY): Payer: Medicare Other | Admitting: Physical Therapy

## 2015-10-02 ENCOUNTER — Telehealth (HOSPITAL_COMMUNITY): Payer: Self-pay | Admitting: Physical Therapy

## 2015-10-02 NOTE — Telephone Encounter (Signed)
Patient a no-show for today's session. Called and left message detailing time/date of next session.  Nedra HaiKristen Ayrianna Mcginniss PT, DPT [phone]

## 2015-10-04 ENCOUNTER — Telehealth (HOSPITAL_COMMUNITY): Payer: Self-pay

## 2015-10-04 ENCOUNTER — Encounter (HOSPITAL_COMMUNITY): Payer: Medicare Other

## 2015-10-04 NOTE — Telephone Encounter (Signed)
Therapist called to notify patient of missed PT visit/No show. Patient did not pick up and unable to leave message.   Bonnee QuinGabe Mackena Plummer, PT, DPT

## 2015-10-09 ENCOUNTER — Ambulatory Visit (HOSPITAL_COMMUNITY): Payer: Medicare Other | Admitting: Physical Therapy

## 2015-10-10 ENCOUNTER — Ambulatory Visit: Payer: Medicare Other | Admitting: Orthopaedic Surgery

## 2015-10-11 ENCOUNTER — Telehealth (HOSPITAL_COMMUNITY): Payer: Self-pay | Admitting: Physical Therapy

## 2015-10-11 ENCOUNTER — Ambulatory Visit (HOSPITAL_COMMUNITY): Payer: Medicare Other | Admitting: Physical Therapy

## 2015-10-11 NOTE — Therapy (Addendum)
[place], [place], [address] Phone: [phone]   Fax:  [phone]  Patient Details  Name: [name] [name] MRN: [id number] Date of Birth: 24-Aug-1950 Referring Provider:  Deloria Lair., MD  Encounter Date: 10/11/2015  PHYSICAL THERAPY DISCHARGE SUMMARY  Visits from Start of Care: 7  Current functional level related to goals / functional outcomes: Unable to assess- has not returned since last skilled session     Remaining deficits: Unable to assess    Education / Equipment: Called patient and left message regarding DC  Plan: Patient agrees to discharge.  Patient goals were partially met. Patient is being discharged due to not returning since the last visit.  ?????       Deniece Ree PT, DPT [place] [address] [place], [place], [address] Phone: [phone]   Fax:  [phone]

## 2015-10-11 NOTE — Telephone Encounter (Signed)
Patient a no-show for today's session. Reviewed chart and phone notes, and this appears to be patient's 3rd or 4th consecutive no-show. Called and left message telling patient he is now being DCed.  Nedra HaiKristen Iman Reinertsen PT, DPT [phone]

## 2015-10-12 ENCOUNTER — Ambulatory Visit (INDEPENDENT_AMBULATORY_CARE_PROVIDER_SITE_OTHER): Payer: Medicare Other | Admitting: Internal Medicine

## 2015-10-12 ENCOUNTER — Encounter: Payer: Self-pay | Admitting: Internal Medicine

## 2015-10-12 VITALS — BP 122/74 | HR 86 | Ht 71.0 in | Wt 296.0 lb

## 2015-10-12 DIAGNOSIS — G2581 Restless legs syndrome: Secondary | ICD-10-CM

## 2015-10-12 DIAGNOSIS — G4733 Obstructive sleep apnea (adult) (pediatric): Secondary | ICD-10-CM

## 2015-10-12 DIAGNOSIS — J3089 Other allergic rhinitis: Secondary | ICD-10-CM

## 2015-10-12 DIAGNOSIS — G4752 REM sleep behavior disorder: Secondary | ICD-10-CM

## 2015-10-12 DIAGNOSIS — J309 Allergic rhinitis, unspecified: Secondary | ICD-10-CM | POA: Diagnosis not present

## 2015-10-12 DIAGNOSIS — J302 Other seasonal allergic rhinitis: Secondary | ICD-10-CM

## 2015-10-12 MED ORDER — AZELASTINE-FLUTICASONE 137-50 MCG/ACT NA SUSP: 2.0000 | Freq: Every day | NASAL | Status: DC

## 2015-10-12 MED ORDER — CLONAZEPAM 0.5 MG PO TABS: ORAL_TABLET | ORAL | Status: DC

## 2015-10-12 NOTE — Progress Notes (Signed)
Subjective:    Patient ID: [name] [name], male    DOB: Aug 22, 1950, 65 y.o.   MRN: [id number]  HPI 08/09/14- Dr Shelle Iron The patient comes in today for follow-up of his obstructive sleep apnea. He has not been seen in 2 years, but is wearing his device compliantly. He has no issues with mask leak or fit, and feels the pressure is adequate. Unfortunately, his download card is not working, so we cannot obtain data. He has gained 18 pounds since the last visit. He also is complaining of worsening RLS symptoms. We tried him on low-dose dopamine agonist at the last visit, and he now tells me that it did not help him. His wife is describing classic RLS movements during the night, although he does have a history of a severe peripheral neuropathy.  10/12/2015-65 year old male former smoker followed for OSA, complicated by restless leg syndrome/ RBD, DVT, DM 2, peripheral vascular disease, peripheral neuropathy s/p mitral and aortic valve repairs, obesity   Wife here NPSG 2003 AHI 14/ hr HST 2012 AHI 19/ hr CPAP 14/[facility]> 16 [facility] patient; DME [facility]. DL attached.  Weight 331 pounds in 2015, now 316 pounds Download shows great compliance but residual AHI of 9.3. He would like to try a pressure increase. Periodic limb movement. They are in separate beds but she says he talks out his dreams. No directed punching. Mirapex and Valium have not been sufficient for calming activity during sleep. Incidental complaint today of nasal congestion related to spring pollen rhinitis  Review of Systems  Constitutional: Negative for fever and unexpected weight change.  HENT: Negative for congestion, dental problem, ear pain, nosebleeds, postnasal drip, rhinorrhea, sinus pressure, sneezing, sore throat and trouble swallowing.   Eyes: Negative for redness and itching.  Respiratory: Negative for cough, chest tightness, shortness of breath and wheezing.   Cardiovascular: Negative for  palpitations and leg swelling.  Gastrointestinal: Negative for nausea and vomiting.  Genitourinary: Negative for dysuria.  Musculoskeletal: Negative for joint swelling.  Skin: Negative for rash.  Neurological: Negative for headaches.  Hematological: Does not bruise/bleed easily.  Psychiatric/Behavioral: Negative for dysphoric mood. The patient is not nervous/anxious.       Objective:  OBJ- Physical Exam General- Alert, Oriented, Affect-appropriate, Distress- none acute Skin- rash-none, lesions- none, excoriation- none Lymphadenopathy- none Head- atraumatic            Eyes- Gross vision intact, PERRLA, conjunctivae and secretions clear            Ears- Hearing, canals-normal            Nose- Clear, no-Septal dev, mucus, polyps, erosion, perforation             Throat- Mallampati III , mucosa clear , drainage- none, tonsils- atrophic Neck- flexible , trachea midline, no stridor , thyroid nl, carotid no bruit Chest - symmetrical excursion , unlabored           Heart/CV- RRR , no murmur , no gallop  , no rub, nl s1 s2                           - JVD- none , edema- none, stasis changes- none, varices- none           Lung- clear to P&A, wheeze- none, cough- none , dullness-none, rub- none           Chest wall-  Abd-  Br/ Gen/ Rectal- Not done, not  indicated Extrem- cyanosis- none, clubbing, none, atrophy- none, strength- nl, + Large soft varices in legs Neuro- grossly intact to observation      Assessment & Plan:

## 2015-10-12 NOTE — Patient Instructions (Addendum)
Order- DME [facility]   Change CPAP pressure to 16, continue mask of choice, humidifier, supplies, AirView    Dx OSA  Script printed to try clonazepam instead of valium to help with limb movement activity during sleep  Please call us as needed  Sample Dymista nasal spray    1-2 puffs each nostril once daily at bedtime

## 2015-10-13 DIAGNOSIS — J302 Other seasonal allergic rhinitis: Secondary | ICD-10-CM | POA: Insufficient documentation

## 2015-10-13 DIAGNOSIS — J3089 Other allergic rhinitis: Secondary | ICD-10-CM

## 2015-10-13 DIAGNOSIS — G4752 REM sleep behavior disorder: Secondary | ICD-10-CM | POA: Insufficient documentation

## 2015-10-13 NOTE — Assessment & Plan Note (Signed)
Tentative diagnosis. Older male in the right demographic. Limb movement being treated as periodic limb movement in sleep. There is a component of activity including long vocalizations during sleep that suggests REM Behavior Disorder. Plan-try clonazepam with discussion

## 2015-10-13 NOTE — Assessment & Plan Note (Signed)
He will continue Mirapex. 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 Plan-try replacing Valium with clonazepam-discussion done

## 2015-10-13 NOTE — Assessment & Plan Note (Signed)
Symptoms consistent with exacerbation of allergic rhinitis now in spring pollen season. Plan-sample Dymista nasal spray for trial

## 2015-10-13 NOTE — Assessment & Plan Note (Signed)
Download confirms great compliance but we would like better control. Plan-increase CPAP to 16/[facility]

## 2015-10-19 ENCOUNTER — Encounter: Payer: Self-pay | Admitting: Internal Medicine

## 2015-10-20 ENCOUNTER — Other Ambulatory Visit (HOSPITAL_COMMUNITY): Payer: Self-pay | Admitting: Sports Medicine

## 2015-10-20 DIAGNOSIS — M4802 Spinal stenosis, cervical region: Secondary | ICD-10-CM

## 2015-10-26 ENCOUNTER — Ambulatory Visit (HOSPITAL_COMMUNITY)
Admission: RE | Admit: 2015-10-26 | Discharge: 2015-10-26 | Disposition: A | Discharge status: Home / Self Care | Payer: Medicare Other | Source: Ambulatory Visit | Attending: Sports Medicine | Admitting: Sports Medicine

## 2015-10-26 DIAGNOSIS — M4802 Spinal stenosis, cervical region: Secondary | ICD-10-CM | POA: Diagnosis not present

## 2015-10-31 ENCOUNTER — Telehealth: Payer: Self-pay | Admitting: Cardiovascular Disease

## 2015-10-31 NOTE — Telephone Encounter (Signed)
New Message  Pt c/o Shortness Of Breath: STAT if SOB developed within the last 24 hours or pt is noticeably SOB on the phone  1. Are you currently SOB (can you hear that pt is SOB on the phone)? No  2. How long have you been experiencing SOB? About 3 weeks  3. Are you SOB when sitting or when up moving around? Moving around. And when he stands up he is light headed for about 1 minute until he feels normal. Nausea on 10/30/2015 took some medication and it went away  4.  Are you currently experiencing any other symptoms? lethargic he goes from the chair to the bed and that's it

## 2015-10-31 NOTE — Telephone Encounter (Signed)
Patient complaining of SOB, weakness, lightheadedness, and dizziness. Patient recently saw his pulmonologist, who made changes to help with SOB. Patient stated that his SOB has not improved with changes to his CPAP.  Patient denies any chest pain. Patient's BP 136/84 HR 70. Scheduled patient [name] on Thursday (earliest appt. Available) to be evaluated. Encouraged patient to go to ED is symptoms get worse. Patient stated he is trying to avoid going to ED, but will go if he has to.

## 2015-11-02 ENCOUNTER — Encounter: Payer: Self-pay | Admitting: Physician Assistant

## 2015-11-02 ENCOUNTER — Encounter: Payer: Self-pay | Admitting: Cardiovascular Disease

## 2015-11-02 ENCOUNTER — Ambulatory Visit (INDEPENDENT_AMBULATORY_CARE_PROVIDER_SITE_OTHER): Payer: Medicare Other | Admitting: Physician Assistant

## 2015-11-02 VITALS — BP 110/76 | HR 76 | Ht 71.0 in | Wt 264.0 lb

## 2015-11-02 DIAGNOSIS — R531 Weakness: Secondary | ICD-10-CM

## 2015-11-02 DIAGNOSIS — Z954 Presence of other heart-valve replacement: Secondary | ICD-10-CM

## 2015-11-02 DIAGNOSIS — R6883 Chills (without fever): Secondary | ICD-10-CM | POA: Diagnosis not present

## 2015-11-02 DIAGNOSIS — R0602 Shortness of breath: Secondary | ICD-10-CM

## 2015-11-02 DIAGNOSIS — Z952 Presence of prosthetic heart valve: Secondary | ICD-10-CM

## 2015-11-02 DIAGNOSIS — R634 Abnormal weight loss: Secondary | ICD-10-CM | POA: Insufficient documentation

## 2015-11-02 DIAGNOSIS — R5383 Other fatigue: Secondary | ICD-10-CM | POA: Diagnosis not present

## 2015-11-02 DIAGNOSIS — I10 Essential (primary) hypertension: Secondary | ICD-10-CM

## 2015-11-02 DIAGNOSIS — Z9889 Other specified postprocedural states: Secondary | ICD-10-CM

## 2015-11-02 NOTE — Patient Instructions (Signed)
Medication Instructions:   Your physician recommends that you continue on your current medications as directed. Please refer to the Current Medication list given to you today.   If you need a refill on your cardiac medications before your next appointment, please call your pharmacy.  Labwork:  BLOOD CULTURE TODAY    Testing/Procedures: NONE ORDER TODAY   Follow-Up: WITH AN APP OR NISHAN IN ONE MONTH    Any Other Special Instructions Will Be Listed Below (If Applicable).

## 2015-11-02 NOTE — Progress Notes (Signed)
Patient ID: [name] [name], male   DOB: [birth year], 65 y.o.   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 Chills  Patient will likely need to be admitted by hospitalist Would diurese, check BC;s  Consider GI consult check Hb TEE to assess AVR and MV repair  Discussed with [name] [name] who is seeing patient today  Jenkins Rouge

## 2015-11-02 NOTE — Progress Notes (Signed)
Patient ID: [name] [name], male   DOB: 12/18/1950, 64 y.o.   MRN: [id number]    Date:  11/02/2015   ID:  [name] [name], DOB 05/12/1951, MRN [id number]  PCP:  TAPPER,DAVID B, MD  Primary Cardiologist:  Nishan  Chief Complaint  Patient presents with  . Shortness of Breath  . Dizziness     History of Present Illness: [name] [name] is a 64 y.o. male with a history of CAD, severe aortic insufficiency, status post aortic valve replacement using a 25 mm Edwards magna ease pericardial tissue valve, and mitral valve repair with complex valvuloplasty including autolgous or cardio patch repair of the posterior leaflet using a 28 mm Memo- 3-D ring annuloplasty. This was completed on October of 2012.  The patient was continued on Xarelto, with a history of multiple DVTs and IVC filter placed. He also has a history of varicosities of the lower extremity and was advised to see a vein specialists for evaluation and treatment.  His history also includes hypertension, obesity, diabetes mellitus, hyperlipidemia and obstructive sleep apnea, congestive heart failure.  His last echocardiogram was in March 2015 and revealed an ejection fraction of 60-65% with normal wall motion. Grade 2 diastolic dysfunction. Only functioning bioprosthetic aortic valve no significant MR, severe left atrial enlargement.  Patient presents with Fatigue, dyspnea, chills and nausea for the last 3 weeks. Patient states that pushing the trash can to the driveway the other day he had to stop and lean on it rest which is quite out of character for him. Has a 52 pound weight loss in the last three months.    The patient currently denies vomiting, fever, chest pain, orthopnea, dizziness, PND, cough, congestion, abdominal pain, hematochezia, melena, lower extremity edema, claudication.  Wt Readings from Last 3 Encounters:  11/02/15 264 lb (119.75 kg)  10/12/15 296 lb (134.265 kg)  08/10/15 316 lb (143.337 kg)     Past Medical History    Diagnosis Date  . Obesity   . Hypertension   . Depression   . 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 Diabetes mellitus   . Hyperlipidemia   . Coronary artery disease (CAD) excluded 2008, 2011    Mild coronary plaque.  w/ normal LV function. repeated cardiac cath on 07/2009  . 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 History of endocarditis     status post Enterococcus faecalis, secondary to chronic injections with low molecular weight heparin  . Deep venous thrombosis (HCC)     recurrent DVT and PE, status post IVC filter  . OSA (obstructive sleep apnea)     CPAP  . CHF (congestive heart failure) (HCC)   . Dyspnea     with exertion  . GERD (gastroesophageal reflux disease)   . Arthritis   . Restless leg syndrome     Current Outpatient Prescriptions  Medication Sig Dispense Refill  . acetaminophen (TYLENOL) 650 MG CR tablet Take 1,300 mg by mouth daily.    . aspirin (ECOTRIN) 325 MG EC tablet Take 325 mg by mouth daily.    . Azelastine-Fluticasone (DYMISTA) 137-50 MCG/ACT SUSP Place 2 sprays into both nostrils at bedtime. 1 Bottle 0  . Cholecalciferol (VITAMIN D3) 5000 units CAPS Take 1 capsule by mouth daily.    . clonazePAM (KLONOPIN) 0.5 MG tablet 1 or 2 tabs at bedtime for   sleep 50 tablet 3  . diazepam (VALIUM) 5 MG tablet Take 5 mg by mouth at bedtime.   0  . Exenatide ER (BYDUREON) 2 MG PEN Inject 2 mg into the skin every Sunday.     . Ferrous Sulfate-C-Folic Acid 105-500-0.8 MG TBCR Take 500 mg by mouth daily.    . FLUoxetine (PROZAC) 40 MG capsule Take 40 mg by mouth daily.    . furosemide (LASIX) 40 MG tablet Take 40 mg by mouth Daily.     . HYDROcodone-acetaminophen (NORCO/VICODIN) 5-325 MG tablet take 1 tablet by mouth every 6 hours if needed for SEVERE PAIN  0  . levothyroxine (SYNTHROID, LEVOTHROID) 200 MCG tablet  Take 200 mcg by mouth daily before breakfast.    . lisinopril (PRINIVIL,ZESTRIL) 10 MG tablet Take 10 mg by mouth daily.    . metFORMIN (GLUCOPHAGE) 500 MG tablet Take 500-1,000 mg by mouth 2 (two) times daily after a meal. 500 mg every morning and 1000 mg every evening    . metoprolol tartrate (LOPRESSOR) 25 MG tablet take 1 tablet by mouth once daily 90 tablet 3  . NON FORMULARY 14 setting    . omeprazole (PRILOSEC) 20 MG capsule Take 20 mg by mouth at bedtime.     . pramipexole (MIRAPEX) 0.25 MG tablet Take 2 tablets by mouth after dinner each night 60 tablet 3  . rivaroxaban (XARELTO) 20 MG TABS tablet Take 1 tablet (20 mg total) by mouth daily. 30 tablet 11  . simvastatin (ZOCOR) 40 MG tablet take 1 tablet by mouth at bedtime 90 tablet 1  . [DISCONTINUED] ONGLYZA 5 MG TABS tablet Take 5 mg by mouth daily.      No current facility-administered medications for this visit.    Allergies:    Allergies  Allergen Reactions  . Invokana [Canagliflozin] Rash    Social History:  The patient  reports that he quit smoking about 41 years ago. His smoking use included Cigarettes. He has a 6 pack-year smoking history. He has never used smokeless tobacco. He reports that he does not drink alcohol or use illicit drugs.   Family history:   Family History  Problem Relation Age of Onset  . Hypertension Father   . Hyperlipidemia Father   . CAD Father   . Diabetes Mother   . Hypertension Mother   . Cancer Mother   . CAD Mother   . Coronary artery disease Mother   . Stroke Mother   . Kidney disease Mother   . Hyperlipidemia Mother   . Diabetes Maternal Grandmother   . Diabetes Maternal Grandfather   . Diabetes Paternal Grandmother   . Diabetes Paternal Grandfather     ROS:  Please see the history of present illness.  All other systems reviewed and negative.   PHYSICAL EXAM: VS:  BP 110/76 mmHg  Pulse 76  Ht 5' 11" (1.803 m)  Wt 264 lb (119.75 kg)  BMI 36.84 kg/m2  SpO2 97% Obese, well  developed, in no acute distress.  Appears fatigued and pale HEENT: Pupils are equal round react to light accommodation extraocular movements are intact.  Pale conjunctiva. Neck: no JVDNo cervical lymphadenopathy. Cardiac: Regular rate and rhythm 1/6 systolic murmur Lungs:  clear to auscultation bilaterally, no wheezing, rhonchi or rales Abd: soft, nontender, positive bowel sounds all quadrants, no hepatosplenomegaly Ext: no lower extremity edema.  2+ radial and dorsalis pedis pulses. Skin: warm and dry Neuro:  Grossly normal  EKG:  Normal sinus rhythm rate 76 bpm     ASSESSMENT AND PLAN:  Problem List Items Addressed This Visit    Unexplained weight loss   S/P mitral valve repair   S/P AVR (aortic valve replacement)   Fatigue - Primary   Relevant Orders   Blood culture (routine single)   EKG 12-Lead   Essential hypertension, benign   Chills   Relevant Orders   Blood culture (routine single)    Other Visit Diagnoses    Weakness        Relevant Orders    Blood culture (routine single)    SOB (shortness of breath)        Relevant Orders    Blood culture (routine single)    EKG 12-Lead       Patient is an obese 64-year-old male with history of aortic valve replacement mitral valve repair, DVT, endocarditis. He was seen by primary care provider on Monday and his hemoglobin decreased to 8 from 8.7  three months ago. Has been on iron and has had some IV supplement. He's had endoscopy and colonoscopy which were normal.  Patient reports chills, nausea severe fatigue for the last 3 weeks. He's had no fever. Said 52 pounds weight loss in the last 3 months which was unintentional.  I strongly encouraged him to go to the emergency room for further evaluation and likely admission. It is possible that he has a recurrence of endocarditis.  Is an elevated sedimentation rate at 69 and BNP of 2900. However, patient does not appear to be in heart failure. He does have a new systolic murmur on exam  which was not there in January.    Patient has decided that he will proceed to the emergency room tomorrow morning. I recommend he go to [place] however he'll probably go to [place]. I have sent him to the lab on his way out to get blood cultures drawn.  His blood pressure is well-controlled with a heart rate of 76 bpm.  See Dr. Nishan's recommendations in his note.   

## 2015-11-03 ENCOUNTER — Emergency Department (HOSPITAL_COMMUNITY): Payer: Medicare Other

## 2015-11-03 ENCOUNTER — Encounter (HOSPITAL_COMMUNITY): Payer: Self-pay | Admitting: Emergency Medicine

## 2015-11-03 ENCOUNTER — Telehealth: Payer: Self-pay | Admitting: Cardiology

## 2015-11-03 ENCOUNTER — Inpatient Hospital Stay (HOSPITAL_COMMUNITY)
Admission: EM | Admit: 2015-11-03 | Discharge: 2015-11-08 | DRG: 158 | Disposition: A | Discharge status: Home Health | Payer: Medicare Other | Attending: Internal Medicine | Admitting: Internal Medicine

## 2015-11-03 DIAGNOSIS — I340 Nonrheumatic mitral (valve) insufficiency: Secondary | ICD-10-CM | POA: Diagnosis not present

## 2015-11-03 DIAGNOSIS — Z7951 Long term (current) use of inhaled steroids: Secondary | ICD-10-CM | POA: Diagnosis not present

## 2015-11-03 DIAGNOSIS — R634 Abnormal weight loss: Secondary | ICD-10-CM | POA: Diagnosis present

## 2015-11-03 DIAGNOSIS — E1151 Type 2 diabetes mellitus with diabetic peripheral angiopathy without gangrene: Secondary | ICD-10-CM | POA: Diagnosis present

## 2015-11-03 DIAGNOSIS — D6489 Other specified anemias: Secondary | ICD-10-CM | POA: Diagnosis not present

## 2015-11-03 DIAGNOSIS — I5032 Chronic diastolic (congestive) heart failure: Secondary | ICD-10-CM | POA: Diagnosis not present

## 2015-11-03 DIAGNOSIS — D509 Iron deficiency anemia, unspecified: Secondary | ICD-10-CM | POA: Diagnosis not present

## 2015-11-03 DIAGNOSIS — Z79899 Other long term (current) drug therapy: Secondary | ICD-10-CM | POA: Diagnosis not present

## 2015-11-03 DIAGNOSIS — K0889 Other specified disorders of teeth and supporting structures: Secondary | ICD-10-CM | POA: Diagnosis not present

## 2015-11-03 DIAGNOSIS — I110 Hypertensive heart disease with heart failure: Secondary | ICD-10-CM | POA: Diagnosis present

## 2015-11-03 DIAGNOSIS — K219 Gastro-esophageal reflux disease without esophagitis: Secondary | ICD-10-CM | POA: Diagnosis not present

## 2015-11-03 DIAGNOSIS — Z8619 Personal history of other infectious and parasitic diseases: Secondary | ICD-10-CM | POA: Diagnosis not present

## 2015-11-03 DIAGNOSIS — Z9889 Other specified postprocedural states: Secondary | ICD-10-CM

## 2015-11-03 DIAGNOSIS — Z952 Presence of prosthetic heart valve: Secondary | ICD-10-CM

## 2015-11-03 DIAGNOSIS — K029 Dental caries, unspecified: Secondary | ICD-10-CM | POA: Diagnosis present

## 2015-11-03 DIAGNOSIS — G2581 Restless legs syndrome: Secondary | ICD-10-CM | POA: Diagnosis not present

## 2015-11-03 DIAGNOSIS — R630 Anorexia: Secondary | ICD-10-CM | POA: Diagnosis present

## 2015-11-03 DIAGNOSIS — I330 Acute and subacute infective endocarditis: Secondary | ICD-10-CM | POA: Diagnosis not present

## 2015-11-03 DIAGNOSIS — Z953 Presence of xenogenic heart valve: Secondary | ICD-10-CM | POA: Diagnosis not present

## 2015-11-03 DIAGNOSIS — R748 Abnormal levels of other serum enzymes: Secondary | ICD-10-CM | POA: Diagnosis present

## 2015-11-03 DIAGNOSIS — R7881 Bacteremia: Secondary | ICD-10-CM | POA: Diagnosis present

## 2015-11-03 DIAGNOSIS — Z96642 Presence of left artificial hip joint: Secondary | ICD-10-CM | POA: Diagnosis present

## 2015-11-03 DIAGNOSIS — Z6836 Body mass index (BMI) 36.0-36.9, adult: Secondary | ICD-10-CM

## 2015-11-03 DIAGNOSIS — F329 Major depressive disorder, single episode, unspecified: Secondary | ICD-10-CM | POA: Diagnosis not present

## 2015-11-03 DIAGNOSIS — Z86711 Personal history of pulmonary embolism: Secondary | ICD-10-CM | POA: Diagnosis not present

## 2015-11-03 DIAGNOSIS — Z8249 Family history of ischemic heart disease and other diseases of the circulatory system: Secondary | ICD-10-CM

## 2015-11-03 DIAGNOSIS — R778 Other specified abnormalities of plasma proteins: Secondary | ICD-10-CM | POA: Diagnosis present

## 2015-11-03 DIAGNOSIS — Z8679 Personal history of other diseases of the circulatory system: Secondary | ICD-10-CM

## 2015-11-03 DIAGNOSIS — I2510 Atherosclerotic heart disease of native coronary artery without angina pectoris: Secondary | ICD-10-CM | POA: Diagnosis present

## 2015-11-03 DIAGNOSIS — Z954 Presence of other heart-valve replacement: Secondary | ICD-10-CM | POA: Diagnosis not present

## 2015-11-03 DIAGNOSIS — Z7901 Long term (current) use of anticoagulants: Secondary | ICD-10-CM

## 2015-11-03 DIAGNOSIS — G4733 Obstructive sleep apnea (adult) (pediatric): Secondary | ICD-10-CM | POA: Diagnosis not present

## 2015-11-03 DIAGNOSIS — Z888 Allergy status to other drugs, medicaments and biological substances status: Secondary | ICD-10-CM | POA: Diagnosis not present

## 2015-11-03 DIAGNOSIS — E669 Obesity, unspecified: Secondary | ICD-10-CM | POA: Diagnosis present

## 2015-11-03 DIAGNOSIS — Z7982 Long term (current) use of aspirin: Secondary | ICD-10-CM | POA: Diagnosis not present

## 2015-11-03 DIAGNOSIS — E785 Hyperlipidemia, unspecified: Secondary | ICD-10-CM | POA: Diagnosis not present

## 2015-11-03 DIAGNOSIS — Z86718 Personal history of other venous thrombosis and embolism: Secondary | ICD-10-CM | POA: Diagnosis not present

## 2015-11-03 DIAGNOSIS — R011 Cardiac murmur, unspecified: Secondary | ICD-10-CM | POA: Diagnosis present

## 2015-11-03 DIAGNOSIS — Z7984 Long term (current) use of oral hypoglycemic drugs: Secondary | ICD-10-CM

## 2015-11-03 DIAGNOSIS — B954 Other streptococcus as the cause of diseases classified elsewhere: Secondary | ICD-10-CM | POA: Diagnosis present

## 2015-11-03 DIAGNOSIS — B955 Unspecified streptococcus as the cause of diseases classified elsewhere: Secondary | ICD-10-CM | POA: Diagnosis present

## 2015-11-03 DIAGNOSIS — I38 Endocarditis, valve unspecified: Secondary | ICD-10-CM | POA: Insufficient documentation

## 2015-11-03 DIAGNOSIS — Z87891 Personal history of nicotine dependence: Secondary | ICD-10-CM

## 2015-11-03 DIAGNOSIS — R6883 Chills (without fever): Secondary | ICD-10-CM | POA: Diagnosis present

## 2015-11-03 DIAGNOSIS — R5383 Other fatigue: Secondary | ICD-10-CM | POA: Diagnosis present

## 2015-11-03 DIAGNOSIS — D649 Anemia, unspecified: Secondary | ICD-10-CM | POA: Diagnosis present

## 2015-11-03 DIAGNOSIS — K089 Disorder of teeth and supporting structures, unspecified: Secondary | ICD-10-CM | POA: Insufficient documentation

## 2015-11-03 DIAGNOSIS — E1159 Type 2 diabetes mellitus with other circulatory complications: Secondary | ICD-10-CM | POA: Diagnosis present

## 2015-11-03 DIAGNOSIS — R110 Nausea: Secondary | ICD-10-CM | POA: Diagnosis not present

## 2015-11-03 DIAGNOSIS — R7989 Other specified abnormal findings of blood chemistry: Secondary | ICD-10-CM | POA: Diagnosis not present

## 2015-11-03 LAB — DIFFERENTIAL
BASOS PCT: 0 %
Basophils Absolute: 0 10*3/uL (ref 0.0–0.1)
EOS ABS: 0 10*3/uL (ref 0.0–0.7)
Eosinophils Relative: 0 %
Lymphocytes Relative: 12 %
Lymphs Abs: 0.8 10*3/uL (ref 0.7–4.0)
MONO ABS: 0.3 10*3/uL (ref 0.1–1.0)
MONOS PCT: 4 %
NEUTROS ABS: 5.9 10*3/uL (ref 1.7–7.7)
Neutrophils Relative %: 84 %

## 2015-11-03 LAB — COMPREHENSIVE METABOLIC PANEL
ALBUMIN: 3.3 g/dL — AB (ref 3.5–5.0)
ALK PHOS: 119 U/L (ref 38–126)
ALT: 19 U/L (ref 17–63)
AST: 29 U/L (ref 15–41)
Anion gap: 7 (ref 5–15)
BILIRUBIN TOTAL: 0.9 mg/dL (ref 0.3–1.2)
BUN: 12 mg/dL (ref 6–20)
CALCIUM: 8.4 mg/dL — AB (ref 8.9–10.3)
CO2: 23 mmol/L (ref 22–32)
CREATININE: 0.83 mg/dL (ref 0.61–1.24)
Chloride: 100 mmol/L — ABNORMAL LOW (ref 101–111)
GFR calc Af Amer: >60 mL/min (ref >60)
GFR calc non Af Amer: >60 mL/min (ref >60)
GLUCOSE: 156 mg/dL — AB (ref 65–99)
Potassium: 3.5 mmol/L (ref 3.5–5.1)
Sodium: 130 mmol/L — ABNORMAL LOW (ref 135–145)
TOTAL PROTEIN: 7.9 g/dL (ref 6.5–8.1)

## 2015-11-03 LAB — SEDIMENTATION RATE: Sed Rate: 63 mm/hr — ABNORMAL HIGH (ref 0–16)

## 2015-11-03 LAB — BRAIN NATRIURETIC PEPTIDE: B Natriuretic Peptide: 398 pg/mL — ABNORMAL HIGH (ref 0.0–100.0)

## 2015-11-03 LAB — CBC
HEMATOCRIT: 28.3 % — AB (ref 39.0–52.0)
HEMOGLOBIN: 8.4 g/dL — AB (ref 13.0–17.0)
MCH: 23 pg — ABNORMAL LOW (ref 26.0–34.0)
MCHC: 29.7 g/dL — ABNORMAL LOW (ref 30.0–36.0)
MCV: 77.5 fL — ABNORMAL LOW (ref 78.0–100.0)
Platelets: 180 10*3/uL (ref 150–400)
RBC: 3.65 MIL/uL — ABNORMAL LOW (ref 4.22–5.81)
RDW: 22 % — ABNORMAL HIGH (ref 11.5–15.5)
WBC: 7.4 10*3/uL (ref 4.0–10.5)

## 2015-11-03 LAB — PROTIME-INR
INR: 1.21 (ref 0.00–1.49)
Prothrombin Time: 15.5 seconds — ABNORMAL HIGH (ref 11.6–15.2)

## 2015-11-03 LAB — TROPONIN I
Troponin I: 0.33 ng/mL — ABNORMAL HIGH (ref <0.031)
Troponin I: 0.36 ng/mL — ABNORMAL HIGH (ref <0.031)

## 2015-11-03 MED ORDER — PANTOPRAZOLE SODIUM 40 MG PO TBEC
40.0000 mg | DELAYED_RELEASE_TABLET | Freq: Every day | ORAL | Status: DC
  Administered 2015-11-04 – 2015-11-08 (×5): 40 mg via ORAL
  Filled 2015-11-03 (×5): qty 1

## 2015-11-03 MED ORDER — ASPIRIN EC 325 MG PO TBEC
325.0000 mg | DELAYED_RELEASE_TABLET | Freq: Every day | ORAL | Status: DC
  Administered 2015-11-04 – 2015-11-08 (×5): 325 mg via ORAL
  Filled 2015-11-03 (×5): qty 1

## 2015-11-03 MED ORDER — LORAZEPAM 2 MG/ML IJ SOLN
0.5000 mg | Freq: Once | INTRAMUSCULAR | Status: AC
  Administered 2015-11-03: 0.5 mg via INTRAVENOUS
  Filled 2015-11-03: qty 1

## 2015-11-03 MED ORDER — ONDANSETRON HCL 4 MG/2ML IJ SOLN: 4.0000 mg | Freq: Four times a day (QID) | INTRAMUSCULAR | Status: DC | PRN

## 2015-11-03 MED ORDER — LISINOPRIL 10 MG PO TABS
10.0000 mg | ORAL_TABLET | Freq: Every day | ORAL | Status: DC
  Administered 2015-11-04 – 2015-11-08 (×5): 10 mg via ORAL
  Filled 2015-11-03 (×5): qty 1

## 2015-11-03 MED ORDER — ONDANSETRON HCL 4 MG PO TABS: 4.0000 mg | ORAL_TABLET | Freq: Four times a day (QID) | ORAL | Status: DC | PRN

## 2015-11-03 MED ORDER — ACETAMINOPHEN 650 MG RE SUPP: 650.0000 mg | Freq: Four times a day (QID) | RECTAL | Status: DC | PRN

## 2015-11-03 MED ORDER — POLYETHYLENE GLYCOL 3350 17 G PO PACK
17.0000 g | PACK | Freq: Every day | ORAL | Status: DC | PRN
  Administered 2015-11-05: 17 g via ORAL
  Filled 2015-11-03: qty 1

## 2015-11-03 MED ORDER — VANCOMYCIN HCL IN DEXTROSE 1-5 GM/200ML-% IV SOLN
1000.0000 mg | Freq: Once | INTRAVENOUS | Status: AC
  Administered 2015-11-03: 1000 mg via INTRAVENOUS
  Filled 2015-11-03: qty 200

## 2015-11-03 MED ORDER — METOPROLOL TARTRATE 25 MG PO TABS
25.0000 mg | ORAL_TABLET | Freq: Every evening | ORAL | Status: DC
  Administered 2015-11-03 – 2015-11-07 (×5): 25 mg via ORAL
  Filled 2015-11-03 (×5): qty 1

## 2015-11-03 MED ORDER — PRAMIPEXOLE DIHYDROCHLORIDE 0.25 MG PO TABS
0.2500 mg | ORAL_TABLET | Freq: Every day | ORAL | Status: DC
  Administered 2015-11-03 – 2015-11-07 (×5): 0.25 mg via ORAL
  Filled 2015-11-03 (×5): qty 1

## 2015-11-03 MED ORDER — FUROSEMIDE 10 MG/ML IJ SOLN
40.0000 mg | Freq: Once | INTRAMUSCULAR | Status: AC
  Administered 2015-11-03: 40 mg via INTRAVENOUS
  Filled 2015-11-03: qty 4

## 2015-11-03 MED ORDER — GABAPENTIN 300 MG PO CAPS
300.0000 mg | ORAL_CAPSULE | Freq: Every day | ORAL | Status: DC
  Administered 2015-11-03 – 2015-11-07 (×5): 300 mg via ORAL
  Filled 2015-11-03 (×5): qty 1

## 2015-11-03 MED ORDER — VANCOMYCIN HCL IN DEXTROSE 1-5 GM/200ML-% IV SOLN
1000.0000 mg | Freq: Two times a day (BID) | INTRAVENOUS | Status: DC
  Administered 2015-11-04 – 2015-11-05 (×3): 1000 mg via INTRAVENOUS
  Filled 2015-11-03 (×4): qty 200

## 2015-11-03 MED ORDER — RIVAROXABAN 20 MG PO TABS
20.0000 mg | ORAL_TABLET | Freq: Every day | ORAL | Status: DC
  Administered 2015-11-03 – 2015-11-08 (×6): 20 mg via ORAL
  Filled 2015-11-03 (×6): qty 1

## 2015-11-03 MED ORDER — INSULIN ASPART 100 UNIT/ML ~~LOC~~ SOLN
0.0000 [IU] | Freq: Three times a day (TID) | SUBCUTANEOUS | Status: DC
  Administered 2015-11-04: 3 [IU] via SUBCUTANEOUS
  Administered 2015-11-04 – 2015-11-05 (×3): 2 [IU] via SUBCUTANEOUS

## 2015-11-03 MED ORDER — FLUOXETINE HCL 20 MG PO CAPS
40.0000 mg | ORAL_CAPSULE | Freq: Every day | ORAL | Status: DC
  Administered 2015-11-04 – 2015-11-08 (×5): 40 mg via ORAL
  Filled 2015-11-03 (×5): qty 2

## 2015-11-03 MED ORDER — SIMVASTATIN 40 MG PO TABS
40.0000 mg | ORAL_TABLET | Freq: Every day | ORAL | Status: DC
  Administered 2015-11-03 – 2015-11-07 (×5): 40 mg via ORAL
  Filled 2015-11-03 (×5): qty 1

## 2015-11-03 MED ORDER — SODIUM CHLORIDE 0.9% FLUSH
3.0000 mL | Freq: Two times a day (BID) | INTRAVENOUS | Status: DC
  Administered 2015-11-03 – 2015-11-06 (×4): 3 mL via INTRAVENOUS

## 2015-11-03 MED ORDER — TAB-A-VITE/IRON PO TABS
1.0000 | ORAL_TABLET | Freq: Every day | ORAL | Status: DC
  Administered 2015-11-04 – 2015-11-08 (×5): 1 via ORAL
  Filled 2015-11-03 (×5): qty 1

## 2015-11-03 MED ORDER — HYDROCODONE-ACETAMINOPHEN 5-325 MG PO TABS
1.0000 | ORAL_TABLET | Freq: Four times a day (QID) | ORAL | Status: DC | PRN
  Administered 2015-11-07: 1 via ORAL
  Filled 2015-11-03: qty 1

## 2015-11-03 MED ORDER — LEVOTHYROXINE SODIUM 112 MCG PO TABS
112.0000 ug | ORAL_TABLET | Freq: Every day | ORAL | Status: DC
  Administered 2015-11-04 – 2015-11-08 (×5): 112 ug via ORAL
  Filled 2015-11-03 (×5): qty 1

## 2015-11-03 MED ORDER — ACETAMINOPHEN 325 MG PO TABS: 650.0000 mg | ORAL_TABLET | Freq: Four times a day (QID) | ORAL | Status: DC | PRN

## 2015-11-03 MED ORDER — METFORMIN HCL ER 500 MG PO TB24
500.0000 mg | ORAL_TABLET | Freq: Two times a day (BID) | ORAL | Status: DC
  Administered 2015-11-04 – 2015-11-08 (×10): 500 mg via ORAL
  Filled 2015-11-03 (×10): qty 1

## 2015-11-03 MED ORDER — EXENATIDE ER 2 MG ~~LOC~~ PEN: 2.0000 mg | PEN_INJECTOR | SUBCUTANEOUS | Status: DC

## 2015-11-03 MED ORDER — FUROSEMIDE 40 MG PO TABS
40.0000 mg | ORAL_TABLET | Freq: Every day | ORAL | Status: DC
  Administered 2015-11-04 – 2015-11-08 (×5): 40 mg via ORAL
  Filled 2015-11-03 (×5): qty 1

## 2015-11-03 MED ORDER — DIAZEPAM 5 MG PO TABS
5.0000 mg | ORAL_TABLET | Freq: Every day | ORAL | Status: DC
  Administered 2015-11-03 – 2015-11-07 (×5): 5 mg via ORAL
  Filled 2015-11-03 (×5): qty 1

## 2015-11-03 MED ORDER — SODIUM CHLORIDE 0.9 % IV BOLUS (SEPSIS)
500.0000 mL | Freq: Once | INTRAVENOUS | Status: AC
  Administered 2015-11-03: 500 mL via INTRAVENOUS

## 2015-11-03 NOTE — Telephone Encounter (Signed)
Called [facility] lab to confirm information received.  2x BC drawn yesterday, aerobic and anaerobic vials both showed growth for gram+ cocci.  They note they will not run a sensitivity if organism confirmed as strep viridans unless specified by provider. Acknowledged information received.  Per 5/11 OV notes, Bryan recommended pt for ER evaluation and probable hospital admission.  I called patient and discussed these recommendations - pt was planning to go to [facility] ED this afternoon.  Called [facility] and spoke to Triage RN who acknowledged and notes in speaking w/ patient earlier, aware he was planning to come this afternoon.

## 2015-11-03 NOTE — Telephone Encounter (Signed)
New message      Positive blood culture result on the pt, from 1 set (anerobic) from the May 11 th, gram positive cocci, this is [facility] lab needs to speak with a nurse.

## 2015-11-03 NOTE — H&P (Signed)
 History and Physical  [name] [name] MRN:[id number] DOB: 07/30/1950 DOA: 11/03/2015  Referring physician: Dr. Goldston, ED physician PCP: TAPPER,DAVID B, MD  Outpatient Specialists:   Dr. Nishan (Cardiology)  Dr Blackman (Ortho)  Dr Nida (Endocrine)  Dr Clance (Pulmonology)  Chief Complaint: Positive blood culture, chills and fatigue for 3 weeks  HPI: [name] [name] is a 64 y.o. 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 The patient has had chills and fatigue for over 3 weeks, with shortness of breath that worsens with activity and improves with rest. He reports similar symptoms when he had bacterial endocarditis in the past. The symptoms are getting worse. The patient saw his cardiologist yesterday and was found to have a new heart murmur. His cardiologist felt that he should be seen in emergency department and worked up with a possible admission. The patient declined. In lieu of this, blood cultures were obtained.  The patient was called today and instructed go to the emergency department due to a positive blood culture. The patient also admits to anemia that has been unresponsive to oral iron and IV iron treatments. Additionally, the patient has had a 50 pound weight loss over the past couple of months. Patient reports a workup by GI, including upper endoscopy and colonoscopy, which he reports is negative. There is no record of this in the EMR.  Review of Systems:   Pt denies any fevers, nausea, vomiting, diarrhea, constipation, abdominal pain, shortness of breath, dyspnea on exertion, orthopnea, cough, wheezing, palpitations, headache, vision changes,  lightheadedness, dizziness, melena, rectal bleeding.  Review of systems are otherwise negative  Past Medical History  Diagnosis Date  . Obesity   . Hypertension   . Depression   . 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 Diabetes mellitus   . Hyperlipidemia   . Coronary artery disease (CAD) excluded 2008, 2011    Mild coronary plaque.  w/ normal LV function. repeated cardiac cath on 07/2009  . 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 History of endocarditis     status post Enterococcus faecalis, secondary to chronic injections with low molecular weight heparin  . Deep venous thrombosis (HCC)     recurrent DVT and PE, status post IVC filter  . OSA (obstructive sleep apnea)     CPAP  . CHF (congestive heart failure) (HCC)   . Dyspnea     with exertion  . GERD (gastroesophageal reflux disease)   . Arthritis   . Restless leg syndrome    Past Surgical History  Procedure Laterality Date  . Thyroidectomy    . Mitral valve repair  04/10/2011    autologous pericardial patch augmentation of posterior leaflet with 28mm Sorin Memo 3D ring annuloplasty  . Aortic valve replacement  04/10/2011    25mm Edwards Magna Ease pericardial bioprosthetic aortic valve  . Cardiac catheterization  2008  . Cardiac catheterization  07/2009    minor irregulreties without obstructive disease  . Colonoscopy w/ polypectomy    . Ivc filter placement ([facility] hx)    . Total hip arthroplasty Left 08/10/2015      Procedure: LEFT TOTAL HIP ARTHROPLASTY ANTERIOR APPROACH;  Surgeon: Christopher Y Blackman, MD;  Location: [facility] OR;  Service: Orthopedics;  Laterality: Left;   Social History:  reports that he quit smoking about 41 years ago. His smoking use included Cigarettes. He has a 6 pack-year smoking history. He has never used smokeless tobacco. He reports that he does not  drink alcohol or use illicit drugs. Patient lives at Home  Allergies  Allergen Reactions  . Invokana [Canagliflozin] Rash    Family History  Problem Relation Age of Onset  . Hypertension Father   . Hyperlipidemia Father   . CAD Father   . Diabetes Mother   . Hypertension Mother   . Cancer Mother   . CAD Mother   . Coronary artery disease Mother   . Stroke Mother   . Kidney disease Mother   . Hyperlipidemia Mother   . Diabetes Maternal Grandmother   . Diabetes Maternal Grandfather   . Diabetes Paternal Grandmother   . Diabetes Paternal Grandfather      Prior to Admission medications   Medication Sig Start Date End Date Taking? Authorizing Provider  acetaminophen (TYLENOL) 650 MG CR tablet Take 1,300 mg by mouth daily.    Historical Provider, MD  aspirin (ECOTRIN) 325 MG EC tablet Take 325 mg by mouth daily.    Historical Provider, MD  Azelastine-Fluticasone (DYMISTA) 137-50 MCG/ACT SUSP Place 2 sprays into both nostrils at bedtime. 10/12/15   Clinton D Young, MD  Cholecalciferol (VITAMIN D3) 5000 units CAPS Take 1 capsule by mouth daily.    Historical Provider, MD  diazepam (VALIUM) 5 MG tablet Take 5 mg by mouth at bedtime.  06/01/14   Historical Provider, MD  Exenatide ER (BYDUREON) 2 MG PEN Inject 2 mg into the skin every Sunday.     Historical Provider, MD  Ferrous Sulfate-C-Folic Acid 105-500-0.8 MG TBCR Take 500 mg by mouth daily.    Historical Provider, MD  FLUoxetine (PROZAC) 40 MG capsule Take 40 mg by mouth daily.    Historical Provider, MD  furosemide (LASIX) 40 MG tablet Take 40 mg by mouth Daily.  04/14/11   Historical Provider, MD  gabapentin (NEURONTIN) 300 MG capsule take 1 capsule by mouth at bedtime for 1 week then take 1 capsule...  (REFER TO PRESCRIPTION NOTES). 10/19/15   Historical Provider, MD  HYDROcodone-acetaminophen (NORCO/VICODIN) 5-325 MG tablet take 1 tablet by mouth every 6 hours if needed for SEVERE PAIN 09/20/15   Historical Provider, MD    levothyroxine (SYNTHROID, LEVOTHROID) 112 MCG tablet  10/26/15   Historical Provider, MD  lisinopril (PRINIVIL,ZESTRIL) 10 MG tablet Take 10 mg by mouth daily.    Historical Provider, MD  metFORMIN (GLUCOPHAGE-XR) 500 MG 24 hr tablet Take 500 mg by mouth 2 (two) times daily. 10/22/15   Historical Provider, MD  metoprolol tartrate (LOPRESSOR) 25 MG tablet take 1 tablet by mouth once daily 12/30/14   Peter C Nishan, MD  NON FORMULARY 14 setting    Historical Provider, MD  omeprazole (PRILOSEC) 20 MG capsule Take 20 mg by mouth at bedtime.     Historical Provider, MD  pramipexole (MIRAPEX) 0.25 MG tablet Take 2 tablets by mouth after dinner each night 01/02/15   Clinton D Young, MD  rivaroxaban (XARELTO) 20 MG TABS tablet Take 1 tablet (20 mg total) by mouth daily. 03/22/15   Peter C Nishan, MD  simvastatin (ZOCOR) 40 MG tablet take 1 tablet by mouth at bedtime 08/01/15   Peter C Nishan, MD      Physical Exam: BP 110/73 mmHg  Pulse 101  Temp(Src) 98.6 F (37 C) (Oral)  Resp 22  Ht 5' 11" (1.803 m)  Wt 119.75 kg (264 lb)  BMI 36.84 kg/m2  SpO2 91%  General: Older Caucasian gentleman. Awake and alert and oriented x3. No acute cardiopulmonary distress.  HEENT: Normocephalic atraumatic.  Right and left ears normal in appearance.  Pupils equal, round, reactive to light. Extraocular muscles are intact. Sclerae anicteric and noninjected.  Moist mucosal membranes. No mucosal lesions.  Neck: Neck supple without lymphadenopathy. No carotid bruits. No masses palpated.  Cardiovascular: Regular rate with 1/6 systolic murmur. No rubs, gallops auscultated. No JVD.  Respiratory: Good respiratory effort. Rales in the bases bilaterally. Lungs clear to auscultation bilaterally.  No accessory muscle use. Abdomen: Soft, nontender, nondistended. Active bowel sounds. No masses or hepatosplenomegaly  Skin: No rashes, lesions, or ulcerations.  Dry, warm to touch. 2+ dorsalis pedis and radial pulses. Musculoskeletal: No calf  or leg pain. All major joints not erythematous nontender.  No upper or lower joint deformation.  Good ROM.  No contractures  Psychiatric: Intact judgment and insight. Pleasant and cooperative. Neurologic: No focal neurological deficits. Strength is 5/5 and symmetric in upper and lower extremities.  Cranial nerves II through XII are grossly intact.           Labs on Admission: I have personally reviewed following labs and imaging studies  CBC:  Recent Labs Lab 11/03/15 1722  WBC 7.4  NEUTROABS 5.9  HGB 8.4*  HCT 28.3*  MCV 77.5*  PLT 180   Basic Metabolic Panel:  Recent Labs Lab 11/03/15 1722  NA 130*  K 3.5  CL 100*  CO2 23  GLUCOSE 156*  BUN 12  CREATININE 0.83  CALCIUM 8.4*   GFR: Estimated Creatinine Clearance: 116.8 mL/min (by C-G formula based on Cr of 0.83). Liver Function Tests:  Recent Labs Lab 11/03/15 1722  AST 29  ALT 19  ALKPHOS 119  BILITOT 0.9  PROT 7.9  ALBUMIN 3.3*   No results for input(s): LIPASE, AMYLASE in the last 168 hours. No results for input(s): AMMONIA in the last 168 hours. Coagulation Profile:  Recent Labs Lab 11/03/15 1722  INR 1.21   Cardiac Enzymes:  Recent Labs Lab 11/03/15 1722  TROPONINI 0.33*   BNP (last 3 results) No results for input(s): PROBNP in the last 8760 hours. HbA1C: No results for input(s): HGBA1C in the last 72 hours. CBG: No results for input(s): GLUCAP in the last 168 hours. Lipid Profile: No results for input(s): CHOL, HDL, LDLCALC, TRIG, CHOLHDL, LDLDIRECT in the last 72 hours. Thyroid Function Tests: No results for input(s): TSH, T4TOTAL, FREET4, T3FREE, THYROIDAB in the last 72 hours. Anemia Panel: No results for input(s): VITAMINB12, FOLATE, FERRITIN, TIBC, IRON, RETICCTPCT in the last 72 hours. Urine analysis:    Component Value Date/Time   LABSPEC >1.046* 04/09/2011 1746   PHURINE 7.0 04/09/2011 1746   GLUCOSEU NEGATIVE 04/09/2011 1746   HGBUR NEGATIVE 04/09/2011 1746    BILIRUBINUR NEGATIVE 04/09/2011 1746   KETONESUR NEGATIVE 04/09/2011 1746   PROTEINUR 100* 04/09/2011 1746   UROBILINOGEN 0.2 04/09/2011 1746   NITRITE NEGATIVE 04/09/2011 1746   LEUKOCYTESUR NEGATIVE 04/09/2011 1746   Sepsis Labs:   Recent Results (from the past 240 hour(s))  Culture, blood (routine x 2)     Status: None (Preliminary result)   Collection Time: 11/03/15  5:22 PM  Result Value Ref Range Status   Specimen Description LEFT ANTECUBITAL  Final     Special Requests BOTTLES DRAWN AEROBIC AND ANAEROBIC 8CC EACH  Final   Culture PENDING  Incomplete   Report Status PENDING  Incomplete  Culture, blood (routine x 2)     Status: None (Preliminary result)   Collection Time: 11/03/15  5:27 PM  Result Value Ref Range Status   Specimen Description BLOOD LEFT HAND  Final   Special Requests BOTTLES DRAWN AEROBIC AND ANAEROBIC [facility] EACH  Final   Culture PENDING  Incomplete   Report Status PENDING  Incomplete     Radiological Exams on Admission: Dg Chest 2 View  11/03/2015  CLINICAL DATA:  Fatigue, chills and shortness of breath 3 weeks. EXAM: CHEST  2 VIEW COMPARISON:  05/06/2011 FINDINGS: The sternotomy wires unchanged. Lungs are adequately inflated with without consolidation or effusion. Stable cardiomegaly. Prosthetic mitral valve unchanged. Remainder exam is unchanged. The IMPRESSION: No active cardiopulmonary disease. Electronically Signed   By: Marin Olp M.D.   On: 11/03/2015 17:12    EKG: Independently reviewed. Normal sinus rhythm with PACs. Normal intervals. No acute ST elevation or depression.  Assessment/Plan: Principal Problem:   Positive blood culture Active Problems:   S/P AVR (aortic valve replacement)   S/P mitral valve repair   History of endocarditis   Elevated troponin I level   Anemia    This patient was discussed with the ED physician, including pertinent vitals, physical exam findings, labs, and imaging.  We also discussed care given by the ED  provider.  #1 positive blood culture  Admit to [facility] on telemetry - the patient appears stable for cardiac telemetry floor  This may be repeat endocarditis  This case was discussed with infectious disease who thought that we would be able to sit to cover the patient with vancomycin.   Pharmacy consult for dosing.  Blood cultures have been redrawn  Will likely need TEE to rule out new bacterial endocarditis  Repeat CBC in the morning  ESR and CRP ordered  Blood culture when necessary fever greater than 101 #2 elevated troponin  Repeat troponin now and every 3 hours  Continue telemetry  Cards consulted #3 history of endocarditis #4 status post aortic valve replacement #5 status post mitral valve replacement #6 anemia  Hemoccult testing ordered  DVT prophylaxis: On Xarelto Consultants: Cards, ID Code Status: Full Family Communication: Wife and daughter in the room.  Disposition Plan: Admit to [facility] Cardiac tele.   Truett Mainland, DO [facility] Pager [phone]  If 7PM-7AM, please contact night-coverage [email or web address] Password [email or web address]

## 2015-11-03 NOTE — ED Notes (Signed)
Pt had a positive blood culture this morning.  Was told by cardiologist to go to ER to be evaluated for possible endocarditis.  Has been feeling bad for 3 weeks with fatigue, chills, and not feeling well.

## 2015-11-03 NOTE — Progress Notes (Signed)
Pharmacy Antibiotic Note  [name] [name] is a 65 y.o. male admitted on 11/03/2015 with endocarditis.  Pharmacy has been consulted for Vancomycin dosing. Empiric tx for GPC in blood cx that are not in EPIC and reported from physicians office.  Plan: Vancomycin 2gm loading dose, then 1000mg   IV every 12 hours.  Goal trough 15-20 mcg/mL.  Height: 5\' 11"  (180.3 cm) Weight: 264 lb (119.75 kg) IBW/kg (Calculated) : 75.3  Temp (24hrs), Avg:98.6 F (37 C), Min:98.4 F (36.9 C), Max:98.8 F (37.1 C)   Recent Labs Lab 11/03/15 1722  WBC 7.4  CREATININE 0.83    Estimated Creatinine Clearance: 116.8 mL/min (by C-G formula based on Cr of 0.83).    Allergies  Allergen Reactions  . Invokana [Canagliflozin] Rash    Antimicrobials this admission: Vancomycin 5/12 >>   Dose adjustments this admission:  Microbiology results: 5/12 BCx: pending  Thank you for allowing pharmacy to be a part of this patient's care.  Elder CyphersLorie Keydi Giel, BS Pharm D, BCPS Clinical Pharmacist Pager [phone] 11/03/2015 8:00 PM

## 2015-11-03 NOTE — ED Notes (Signed)
O

## 2015-11-03 NOTE — ED Provider Notes (Signed)
CSN: [id number]     Arrival date & time 11/03/15  1632 History   First MD Initiated Contact with Patient 11/03/15 1704     Chief Complaint  Patient presents with  . Abnormal Lab     (Consider location/radiation/quality/duration/timing/severity/associated sxs/prior Treatment) HPI  65 year old male sent in by his cardiologist for positive Blood cultures. Patient has been having over 3 weeks of feeling "bad". He's been having intermittent fatigue, chills, and overall not feeling well sensation. Has been short of breath for about one week. No cough or chest pain. Has been having subjective fevers and chills. Feels like he has no energy. He was diagnosed with anemia couple months ago and was placed on iron. Recently his hemoglobin was checked and had slightly decreased. Patient has had endocarditis before and states it feels similar. He was noticed to have a new murmur by his PCP and cardiologist yesterday. They do blood cultures and call them in today because they were positive. Gram-positive cocci on the blood culture per cardiology note but this is not showing up in Epic. Patient denies any leg swelling. He has been having a poor appetite and has lost over 50 pounds over the last 3 months. Has a pig valve.  Past Medical History  Diagnosis Date  . Obesity   . Hypertension   . Depression   . 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 Diabetes mellitus   . Hyperlipidemia   . Coronary artery disease (CAD) excluded 2008, 2011    Mild coronary plaque.  w/ normal LV function. repeated cardiac cath on 07/2009  . 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 History of endocarditis     status post Enterococcus faecalis, secondary to chronic injections with low molecular weight heparin  . Deep venous thrombosis (HCC)     recurrent DVT and PE, status post IVC  filter  . OSA (obstructive sleep apnea)     CPAP  . CHF (congestive heart failure) (HCC)   . Dyspnea     with exertion  . GERD (gastroesophageal reflux disease)   . Arthritis   . Restless leg syndrome    Past Surgical History  Procedure Laterality Date  . Thyroidectomy    . Mitral valve repair  04/10/2011    autologous pericardial patch augmentation of posterior leaflet with 28mm Sorin Memo 3D ring annuloplasty  . Aortic valve replacement  04/10/2011    25mm [facility] Ease pericardial bioprosthetic aortic valve  . Cardiac catheterization  2008  . Cardiac catheterization  07/2009    minor irregulreties without obstructive disease  . Colonoscopy w/ polypectomy    . Ivc filter placement ([facility] hx)    . Total hip arthroplasty Left 08/10/2015    Procedure: LEFT TOTAL HIP ARTHROPLASTY ANTERIOR APPROACH;  Surgeon: Kathryne Hitch, MD;  Location: [facility] OR;  Service: Orthopedics;  Laterality: Left;   Family History  Problem Relation Age of Onset  . Hypertension Father   . Hyperlipidemia Father   . CAD Father   . Diabetes Mother   . Hypertension Mother   . Cancer Mother   . CAD Mother   . Coronary artery disease Mother   . Stroke Mother   . Kidney disease Mother   . Hyperlipidemia Mother   . Diabetes Maternal Grandmother   . Diabetes Maternal Grandfather   . Diabetes  Paternal Grandmother   . Diabetes Paternal Grandfather    Social History  Substance Use Topics  . Smoking status: Former Smoker -- 1.00 packs/day for 6 years    Types: Cigarettes    Quit date: 01/22/1974  . Smokeless tobacco: Never Used  . Alcohol Use: No    Review of Systems  Constitutional: Positive for chills, fatigue and unexpected weight change.  Respiratory: Positive for shortness of breath. Negative for cough.   Cardiovascular: Negative for chest pain and leg swelling.  Gastrointestinal: Positive for nausea. Negative for vomiting.  All other systems reviewed and are negative.     Allergies   Invokana  Home Medications   Prior to Admission medications   Medication Sig Start Date End Date Taking? Authorizing Provider  acetaminophen (TYLENOL) 650 MG CR tablet Take 1,300 mg by mouth daily.    Historical Provider, MD  aspirin (ECOTRIN) 325 MG EC tablet Take 325 mg by mouth daily.    Historical Provider, MD  Azelastine-Fluticasone (DYMISTA) 137-50 MCG/ACT SUSP Place 2 sprays into both nostrils at bedtime. 10/12/15   Waymon Budge, MD  Cholecalciferol (VITAMIN D3) 5000 units CAPS Take 1 capsule by mouth daily.    Historical Provider, MD  clonazePAM (KLONOPIN) 0.5 MG tablet 1 or 2 tabs at bedtime for sleep 10/12/15   Waymon Budge, MD  diazepam (VALIUM) 5 MG tablet Take 5 mg by mouth at bedtime.  06/01/14   Historical Provider, MD  Exenatide ER (BYDUREON) 2 MG PEN Inject 2 mg into the skin every Sunday.     Historical Provider, MD  Ferrous Sulfate-C-Folic Acid 105-500-0.8 MG TBCR Take 500 mg by mouth daily.    Historical Provider, MD  FLUoxetine (PROZAC) 40 MG capsule Take 40 mg by mouth daily.    Historical Provider, MD  furosemide (LASIX) 40 MG tablet Take 40 mg by mouth Daily.  04/14/11   Historical Provider, MD  HYDROcodone-acetaminophen (NORCO/VICODIN) 5-325 MG tablet take 1 tablet by mouth every 6 hours if needed for SEVERE PAIN 09/20/15   Historical Provider, MD  levothyroxine (SYNTHROID, LEVOTHROID) 200 MCG tablet Take 200 mcg by mouth daily before breakfast.    Historical Provider, MD  lisinopril (PRINIVIL,ZESTRIL) 10 MG tablet Take 10 mg by mouth daily.    Historical Provider, MD  metFORMIN (GLUCOPHAGE) 500 MG tablet Take 500-1,000 mg by mouth 2 (two) times daily after a meal. 500 mg every morning and 1000 mg every evening    Historical Provider, MD  metoprolol tartrate (LOPRESSOR) 25 MG tablet take 1 tablet by mouth once daily 12/30/14   Wendall Stade, MD  NON FORMULARY 14 setting    Historical Provider, MD  omeprazole (PRILOSEC) 20 MG capsule Take 20 mg by mouth at bedtime.      Historical Provider, MD  pramipexole (MIRAPEX) 0.25 MG tablet Take 2 tablets by mouth after dinner each night 01/02/15   Waymon Budge, MD  rivaroxaban (XARELTO) 20 MG TABS tablet Take 1 tablet (20 mg total) by mouth daily. 03/22/15   Wendall Stade, MD  simvastatin (ZOCOR) 40 MG tablet take 1 tablet by mouth at bedtime 08/01/15   Wendall Stade, MD   BP 121/84 mmHg  Pulse 103  Temp(Src) 98.8 F (37.1 C) (Oral)  Resp 18  Ht  (1.803 m)  Wt 264 lb (119.75 kg)  BMI 36.84 kg/m2  SpO2 98% Physical Exam  Constitutional: He is oriented to person, place, and time. He appears well-developed and well-nourished. No distress.  HENT:  Head: Normocephalic and atraumatic.  Right Ear: External ear normal.  Left Ear: External ear normal.  Nose: Nose normal.  Eyes: Right eye exhibits no discharge. Left eye exhibits no discharge.  Neck: Neck supple.  Cardiovascular: Normal rate, regular rhythm and intact distal pulses.   Murmur heard. Pulmonary/Chest: Effort normal and breath sounds normal. He has no rales.  Abdominal: Soft. There is no tenderness.  Musculoskeletal: He exhibits no edema.  Neurological: He is alert and oriented to person, place, and time.  Skin: Skin is warm and dry. He is not diaphoretic.  Nursing note and vitals reviewed.   ED Course  Procedures (including critical care time) Labs Review Labs Reviewed  CBC - Abnormal; Notable for the following:    RBC 3.65 (*)    Hemoglobin 8.4 (*)    HCT 28.3 (*)    MCV 77.5 (*)    MCH 23.0 (*)    MCHC 29.7 (*)    RDW 22.0 (*)    All other components within normal limits  COMPREHENSIVE METABOLIC PANEL - Abnormal; Notable for the following:    Sodium 130 (*)    Chloride 100 (*)    Glucose, Bld 156 (*)    Calcium 8.4 (*)    Albumin 3.3 (*)    All other components within normal limits  BRAIN NATRIURETIC PEPTIDE - Abnormal; Notable for the following:    B Natriuretic Peptide 398.0 (*)    All other components within normal  limits  TROPONIN I - Abnormal; Notable for the following:    Troponin I 0.33 (*)    All other components within normal limits  PROTIME-INR - Abnormal; Notable for the following:    Prothrombin Time 15.5 (*)    All other components within normal limits  TROPONIN I - Abnormal; Notable for the following:    Troponin I 0.36 (*)    All other components within normal limits  SEDIMENTATION RATE - Abnormal; Notable for the following:    Sed Rate 63 (*)    All other components within normal limits  CULTURE, BLOOD (ROUTINE X 2)  CULTURE, BLOOD (ROUTINE X 2)  CULTURE, BLOOD (ROUTINE X 2)  CULTURE, BLOOD (ROUTINE X 2)  DIFFERENTIAL  TROPONIN I  TROPONIN I  TROPONIN I  C-REACTIVE PROTEIN  BASIC METABOLIC PANEL  CBC  SEDIMENTATION RATE  C-REACTIVE PROTEIN    Imaging Review Dg Chest 2 View  11/03/2015  CLINICAL DATA:  Fatigue, chills and shortness of breath 3 weeks. EXAM: CHEST  2 VIEW COMPARISON:  05/06/2011 FINDINGS: The sternotomy wires unchanged. Lungs are adequately inflated with without consolidation or effusion. Stable cardiomegaly. Prosthetic mitral valve unchanged. Remainder exam is unchanged. The IMPRESSION: No active cardiopulmonary disease. Electronically Signed   By: Elberta Fortisaniel  Boyle M.D.   On: 11/03/2015 17:12   I have personally reviewed and evaluated these images and lab results as part of my medical decision-making.   EKG Interpretation   Date/Time:  Friday Nov 03 2015 16:40:22 EDT Ventricular Rate:  106 PR Interval:  166 QRS Duration: 92 QT Interval:  358 QTC Calculation: 475 R Axis:   29 Text Interpretation:  Sinus tachycardia with Premature atrial complexes  Otherwise normal ECG rate is faster, otherwise no change since Feb 2017  Confirmed by Criss AlvineGOLDSTON MD, Marenda Accardi [id number]) on 11/03/2015 5:05:32 PM      MDM   Final diagnoses:  Positive blood culture    Patient with 3 weeks of vague symptoms. New concerning heart murmur in prosthetic valve.  At least one positive  blood culture (through notes, no results in Epic). Does not appear septic. Dr. Adrian Blackwater to admit to [facility]. Has low level troponin elevation, unclear if from heart failure or not. Does not appear to have ACS. D/w Cards, Dr. Rennis Golden, who will admit. D/w ID, Dr. Orvan Falconer, who recommends 1 more set of blood cultures, and starting IV vanc. He will see in AM.     Pricilla Loveless, MD 11/04/15 [phone]

## 2015-11-04 ENCOUNTER — Inpatient Hospital Stay (HOSPITAL_COMMUNITY): Payer: Medicare Other

## 2015-11-04 DIAGNOSIS — R110 Nausea: Secondary | ICD-10-CM

## 2015-11-04 DIAGNOSIS — R5383 Other fatigue: Secondary | ICD-10-CM

## 2015-11-04 DIAGNOSIS — R7881 Bacteremia: Secondary | ICD-10-CM

## 2015-11-04 DIAGNOSIS — R6883 Chills (without fever): Secondary | ICD-10-CM

## 2015-11-04 DIAGNOSIS — B955 Unspecified streptococcus as the cause of diseases classified elsewhere: Secondary | ICD-10-CM | POA: Diagnosis present

## 2015-11-04 DIAGNOSIS — Z952 Presence of prosthetic heart valve: Secondary | ICD-10-CM

## 2015-11-04 DIAGNOSIS — R634 Abnormal weight loss: Secondary | ICD-10-CM | POA: Diagnosis present

## 2015-11-04 DIAGNOSIS — R531 Weakness: Secondary | ICD-10-CM

## 2015-11-04 LAB — BASIC METABOLIC PANEL
ANION GAP: 12 (ref 5–15)
BUN: 10 mg/dL (ref 6–20)
CHLORIDE: 99 mmol/L — AB (ref 101–111)
CO2: 23 mmol/L (ref 22–32)
Calcium: 8.5 mg/dL — ABNORMAL LOW (ref 8.9–10.3)
Creatinine, Ser: 0.89 mg/dL (ref 0.61–1.24)
GFR calc non Af Amer: >60 mL/min (ref >60)
Glucose, Bld: 156 mg/dL — ABNORMAL HIGH (ref 65–99)
POTASSIUM: 3.6 mmol/L (ref 3.5–5.1)
SODIUM: 134 mmol/L — AB (ref 135–145)

## 2015-11-04 LAB — CBC
HEMATOCRIT: 26.6 % — AB (ref 39.0–52.0)
HEMOGLOBIN: 7.7 g/dL — AB (ref 13.0–17.0)
MCH: 22.1 pg — ABNORMAL LOW (ref 26.0–34.0)
MCHC: 28.9 g/dL — ABNORMAL LOW (ref 30.0–36.0)
MCV: 76.4 fL — AB (ref 78.0–100.0)
PLATELETS: 193 10*3/uL (ref 150–400)
RBC: 3.48 MIL/uL — AB (ref 4.22–5.81)
RDW: 22 % — ABNORMAL HIGH (ref 11.5–15.5)
WBC: 7.9 10*3/uL (ref 4.0–10.5)

## 2015-11-04 LAB — TROPONIN I
TROPONIN I: 0.33 ng/mL — AB (ref <0.031)
TROPONIN I: 0.27 ng/mL — AB (ref <0.031)
Troponin I: 0.26 ng/mL — ABNORMAL HIGH (ref <0.031)
Troponin I: 0.28 ng/mL — ABNORMAL HIGH (ref <0.031)

## 2015-11-04 LAB — GLUCOSE, CAPILLARY
GLUCOSE-CAPILLARY: 138 mg/dL — AB (ref 65–99)
GLUCOSE-CAPILLARY: 107 mg/dL — AB (ref 65–99)
Glucose-Capillary: 155 mg/dL — ABNORMAL HIGH (ref 65–99)
Glucose-Capillary: 120 mg/dL — ABNORMAL HIGH (ref 65–99)

## 2015-11-04 LAB — C-REACTIVE PROTEIN
CRP: 11.3 mg/dL — ABNORMAL HIGH (ref <1.0)
CRP: 11.7 mg/dL — AB (ref <1.0)

## 2015-11-04 LAB — SEDIMENTATION RATE: Sed Rate: 68 mm/hr — ABNORMAL HIGH (ref 0–16)

## 2015-11-04 MED ORDER — PERFLUTREN LIPID MICROSPHERE
1.0000 mL | INTRAVENOUS | Status: AC | PRN
  Administered 2015-11-04: 4 mL via INTRAVENOUS
  Filled 2015-11-04: qty 10

## 2015-11-04 NOTE — Progress Notes (Addendum)
Followed up with [name] [name] from lab who was on the floor drawing morning labs and she indicated she had just drawn all of the labs for this patient so they should be in process.

## 2015-11-04 NOTE — Progress Notes (Signed)
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  He been having fevers and chills, admission was recommended and he declined. Outpatient blood cultures were obtained and are positive by report although I do not see this.  withoutcomplaints   Past Medical History  Diagnosis Date  . Obesity   . Hypertension   . Depression   . 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 Diabetes mellitus   . Hyperlipidemia   . Coronary artery disease (CAD) excluded 2008, 2011    Mild coronary plaque.  w/ normal LV function. repeated cardiac cath on 07/2009  . 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 History of endocarditis     status post Enterococcus faecalis, secondary to chronic injections with low molecular weight heparin  . Deep venous thrombosis (HCC)     recurrent DVT and PE, status post IVC filter  . OSA (obstructive sleep apnea)     CPAP  . CHF (congestive heart failure) (HCC)   . Dyspnea     with exertion  . GERD (gastroesophageal reflux disease)   . Arthritis   . Restless leg syndrome     Scheduled Meds:  Scheduled Meds: . aspirin  325 mg Oral Daily  . diazepam  5 mg Oral QHS  . [START ON 11/05/2015] Exenatide ER  2 mg Subcutaneous Q Sun  . FLUoxetine  40 mg Oral Daily  . furosemide  40 mg Oral Daily  . gabapentin  300 mg Oral QHS  . insulin aspart  0-15 Units Subcutaneous TID WC  . levothyroxine  112 mcg Oral QAC breakfast  . lisinopril  10 mg Oral Daily  . metFORMIN  500 mg Oral BID WC  . metoprolol tartrate  25 mg Oral QPM  . multivitamins with iron  1 tablet Oral Daily  . pantoprazole  40 mg Oral Daily  . pramipexole  0.25 mg Oral QHS  .  rivaroxaban  20 mg Oral Q supper  . simvastatin  40 mg Oral QHS  . sodium chloride flush  3 mL Intravenous Q12H  . vancomycin  1,000 mg Intravenous Q12H   Continuous Infusions:  acetaminophen **OR** acetaminophen, HYDROcodone-acetaminophen, ondansetron **OR** ondansetron (ZOFRAN) IV, polyethylene glycol    PHYSICAL EXAM Filed Vitals:   11/03/15 2045 11/03/15 2200 11/04/15 0018 11/04/15 0528  BP: 129/74 148/61  115/81  Pulse: 99 106 106 89  Temp:  98.6 F (37 C)  98.2 F (36.8 C)  TempSrc:  Oral  Oral  Resp: 26 23 20 20   Height:      Weight:      SpO2: 97% 100% 96% 90%    Well developed and nourished in no acute distress HENT normal Neck supple with JVP-flat Clear Regular rate and rhythm, no murmurs or gallops Abd-soft with active BS No Clubbing cyanosis edema Skin-warm and dry A & Oriented  Grossly normal sensory and motor function   TELEMETRY: Reviewed telemetry pt in sinus:    Intake/Output Summary (Last 24 hours) at 11/04/15 1028 Last data filed at 11/04/15 0528  Gross per 24 hour  Intake      0 ml  Output   1850 ml  Net  -1850 ml    LABS: Basic Metabolic Panel:  Recent Labs Lab 11/03/15 1722 11/04/15 0436  NA 130* 134*  K 3.5 3.6  CL 100* 99*  CO2 23 23  GLUCOSE 156* 156*  BUN 12 10  CREATININE 0.83 0.89  CALCIUM 8.4* 8.5*   Cardiac Enzymes:  Recent Labs  11/03/15 1722 11/03/15 1945 11/04/15 0436  TROPONINI 0.33* 0.36* 0.33*   CBC:  Recent Labs Lab 11/03/15 1722 11/04/15 0436  WBC 7.4 7.9  NEUTROABS 5.9  --   HGB 8.4* 7.7*  HCT 28.3* 26.6*  MCV 77.5* 76.4*  PLT 180 193   PROTIME:  Recent Labs  11/03/15 1722  LABPROT 15.5*  INR 1.21   Liver Function Tests:  Recent Labs  11/03/15 1722  AST 29  ALT 19  ALKPHOS 119  BILITOT 0.9  PROT 7.9  ALBUMIN 3.3*   No results for input(s): LIPASE, AMYLASE in the last 72 hours. BNP: BNP (last 3 results)  Recent Labs  11/03/15 1722  BNP 398.0*    ProBNP (last 3  results) No results for input(s): PROBNP in the last 8760 hours.  D-Dimer: No results for input(s): DDIMER in the last 72 hours. Hemoglobin A1C:   ASSESSMENT AND PLAN:  Principal Problem:   Positive blood culture Active Problems:   S/P AVR (aortic valve replacement)   S/P mitral valve repair   History of endocarditis   Elevated troponin I level   Anemia  Will need TEE will arranged Will call ID Continue antibiotics   Signed, Sherryl Manges MD  11/04/2015

## 2015-11-04 NOTE — Progress Notes (Signed)
Received a phone call regarding patient from [facility]. Stating that Blood cultures that were drawn are Positive for Gram positive cocci in chains. Dr Cena [facility]entonVega [facility] make aware will monitor patient. Zinnia Tindall, Randall AnKristin Jessup RN

## 2015-11-04 NOTE — Progress Notes (Signed)
Contacted Stephanie in lab to request that troponins be drawn that have been ordered earlier in the shift. She said she would send someone to draw the labs.

## 2015-11-04 NOTE — Consult Note (Signed)
CARDIOLOGY CONSULT NOTE   Patient ID: [name] [name] MRN: [id number] DOB/AGE: [birth year] 65 y.o.  Admit date: 11/03/2015  Requesting Physician: Primary Physician:   Deloria Lair, MD Primary Cardiologist:  Johnsie Cancel Reason for Consultation:   C/f endocarditis  HPI: [name] [name] is a 65 y.o. male with a complicated PMH most significant for CAD, HFpEF, E. Faecalis endocarditis c/b severe AI and now s/p bioprosthetic AVR, MR s/p MV repair, DVTs on apixaban who presents with concerns for recurrent endocarditis w/ several weeks of fevers/chills/sweats.  [name] [name] has also noted worsening DOE although denies SOB at rest.  His recent state of health has also been complicated by a refractory microcytic anemia and unintended weight loss.  He was seen in cardiology clinic yesterday and at that time it was requested that he be directly admitted for further w/u, but he declined at that time and instead presented to [facility] today.   Past Medical History  Diagnosis Date  . Obesity   . Hypertension   . Depression   . 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 Diabetes mellitus   . Hyperlipidemia   . Coronary artery disease (CAD) excluded 2008, 2011    Mild coronary plaque.  w/ normal LV function. repeated cardiac cath on 07/2009  . 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 History of endocarditis     status post Enterococcus faecalis, secondary to chronic injections with low molecular weight heparin  . Deep venous thrombosis (HCC)     recurrent DVT and PE, status post IVC filter  . OSA (obstructive sleep apnea)     CPAP  . CHF (congestive heart failure) ([place])   . Dyspnea     with exertion  . GERD (gastroesophageal reflux disease)   . Arthritis   . Restless leg syndrome      Past Surgical History  Procedure Laterality Date  .  Thyroidectomy    . Mitral valve repair  04/10/2011    autologous pericardial patch augmentation of posterior leaflet with 58m Sorin Memo 3D ring annuloplasty  . Aortic valve [facility]  04[facility]10/2011    [facility]  . Cardiac catheterization  2008  . Cardiac catheterization  07/2009    minor irregulreties without obstructive disease  . Colonoscopy w/ polypectomy    . Ivc filter placement ([facility] hx)    . Total hip arthroplasty Left 08/10/2015    Procedure: LEFT TOTAL HIP ARTHROPLASTY ANTERIOR APPROACH;  Surgeon: ChMcarthur RossettiMD;  [facility];  Laterality: Left;    Allergies  Allergen Reactions  . Invokana [Canagliflozin] Rash    I have reviewed the patient's current medications . aspirin  325 mg Oral Daily  . diazepam  5 mg Oral QHS  . [START ON 11/05/2015] Exenatide ER  2 mg Subcutaneous Q Sun  . FLUoxetine  40 mg Oral Daily  . furosemide  40 mg Oral Daily  . gabapentin  300 mg Oral QHS  . insulin aspart  0-15 Units Subcutaneous TID WC  . levothyroxine  112 mcg Oral QAC breakfast  . lisinopril  10 mg Oral Daily  . metFORMIN  500 mg Oral BID WC  . metoprolol tartrate  25 mg Oral QPM  . multivitamins with iron  1 tablet Oral Daily  .  pantoprazole  40 mg Oral Daily  . pramipexole  0.25 mg Oral QHS  . rivaroxaban  20 mg Oral Q supper  . simvastatin  40 mg Oral QHS  . sodium chloride flush  3 mL Intravenous Q12H  . vancomycin  1,000 mg Intravenous Q12H     acetaminophen **OR** acetaminophen, HYDROcodone-acetaminophen, ondansetron **OR** ondansetron (ZOFRAN) IV, polyethylene glycol  Prior to Admission medications   Medication Sig Start Date End Date Taking? Authorizing Provider  diazepam (VALIUM) 5 MG tablet Take 5 mg by mouth at bedtime.  06/01/14  Yes Historical Provider, MD  FLUoxetine (PROZAC) 40 MG capsule Take 40 mg by mouth daily.   Yes Historical Provider, MD  gabapentin (NEURONTIN) 300  MG capsule TAKE ONE CAPSULE BY MOUTH ONCE DAILY AT BEDTIME. 10/19/15  Yes Historical Provider, MD  HYDROcodone-acetaminophen (NORCO/VICODIN) 5-325 MG tablet take 1 tablet by mouth every 6 hours if needed for SEVERE PAIN 09/20/15  Yes Historical Provider, MD  levothyroxine (SYNTHROID, LEVOTHROID) 112 MCG tablet Take 112 mcg by mouth daily before breakfast.  10/26/15  Yes Historical Provider, MD  metFORMIN (GLUCOPHAGE-XR) 500 MG 24 hr tablet Take 500 mg by mouth 2 (two) times daily. 10/22/15  Yes Historical Provider, MD  metoprolol tartrate (LOPRESSOR) 25 MG tablet take 1 tablet by mouth once daily 12/30/14  Yes Josue Hector, MD  omeprazole (PRILOSEC) 20 MG capsule Take 20 mg by mouth at bedtime.    Yes Historical Provider, MD  rivaroxaban (XARELTO) 20 MG TABS tablet Take 1 tablet (20 mg total) by mouth daily. 03/22/15  Yes Josue Hector, MD  simvastatin (ZOCOR) 40 MG tablet take 1 tablet by mouth at bedtime 08/01/15  Yes Josue Hector, MD  acetaminophen (TYLENOL) 650 MG CR tablet Take 1,300 mg by mouth daily.    Historical Provider, MD  aspirin (ECOTRIN) 325 MG EC tablet Take 325 mg by mouth daily.    Historical Provider, MD  Azelastine-Fluticasone (DYMISTA) 137-50 MCG/ACT SUSP Place 2 sprays into both nostrils at bedtime. 10/12/15   Deneise Lever, MD  Cholecalciferol (VITAMIN D3) 5000 units CAPS Take 1 capsule by mouth daily.    Historical Provider, MD  Exenatide ER (BYDUREON) 2 MG PEN Inject 2 mg into the skin every Sunday.     Historical Provider, MD  Ferrous Sulfate-C-Folic Acid 817-711-6.5 MG TBCR Take 500 mg by mouth daily.    Historical Provider, MD  furosemide (LASIX) 40 MG tablet Take 40 mg by mouth Daily.  04/14/11   Historical Provider, MD  lisinopril (PRINIVIL,ZESTRIL) 10 MG tablet Take 10 mg by mouth daily.    Historical Provider, MD  pramipexole (MIRAPEX) 0.25 MG tablet Take 2 tablets by mouth after dinner each night 01/02/15   Deneise Lever, MD     Social History   Social History  .  Marital Status: Married    Spouse Name: RITA  . Number of Children: 1  . Years of Education: N/A   Occupational History  . disabled    Social History Main Topics  . Smoking status: Former Smoker -- 1.00 packs/day for 6 years    Types: Cigarettes    Quit date: 01/22/1974  . Smokeless tobacco: Never Used  . Alcohol Use: No  . Drug Use: No  . Sexual Activity: Not on file   Other Topics Concern  . Not on file   Social History Narrative    Family Status  Relation Status Death Age  . Father Deceased    Family History  Problem Relation Age of Onset  . Hypertension Father   . Hyperlipidemia Father   . CAD Father   . Diabetes Mother   . Hypertension Mother   . Cancer Mother   . CAD Mother   . Coronary artery disease Mother   . Stroke Mother   . Kidney disease Mother   . Hyperlipidemia Mother   . Diabetes Maternal Grandmother   . Diabetes Maternal Grandfather   . Diabetes Paternal Grandmother   . Diabetes Paternal Grandfather      ROS:  Full 14 point review of systems complete and found to be negative unless listed above.  Physical Exam: Blood pressure 148/61, pulse 106, temperature 98.6 F (37 C), temperature source Oral, resp. rate 20, height _0  (1.803 m), weight 119.75 kg (264 lb), SpO2 96 %.  General: Well developed, well nourished, male in no acute distress Head: Eyes PERRLA, No xanthomas.   Normocephalic and atraumatic, oropharynx without edema or exudate. Dentition:  Lungs: CTAB, no w/r/c Heart: RRR, +S1 +S2, II/VI mid-peaking SEM at the LUSB, II/VI decrescendo SEM at the apex Neck: No carotid bruits. No lymphadenopathy.  JVD. Abdomen: Bowel sounds present, abdomen soft and non-tender without masses or hernias noted. Msk:  No spine or cva tenderness. No weakness, no joint deformities or effusions. Extremities: No clubbing or cyanosis.  edema.  Neuro: Alert and oriented X 3. No focal deficits noted. Psych:  Good affect, responds appropriately Skin: No  rashes or lesions noted.  Labs:   Lab Results  Component Value Date   WBC 7.4 11/03/2015   HGB 8.4* 11/03/2015   HCT 28.3* 11/03/2015   MCV 77.5* 11/03/2015   PLT 180 11/03/2015    Recent Labs  11/03/15 1722  INR 1.21    Recent Labs Lab 11/03/15 1722  NA 130*  K 3.5  CL 100*  CO2 23  BUN 12  CREATININE 0.83  CALCIUM 8.4*  PROT 7.9  BILITOT 0.9  ALKPHOS 119  ALT 19  AST 29  GLUCOSE 156*  ALBUMIN 3.3*   MAGNESIUM  Date Value Ref Range Status  04/11/2011 2.1 1.5 - 2.5 mg/dL Final    Recent Labs  11/03/15 1722 11/03/15 1945  TROPONINI 0.33* 0.36*   No results for input(s): TROPIPOC in the last 72 hours. No results found for: PROBNP Lab Results  Component Value Date   CHOL 131 08/13/2013   HDL 48 08/13/2013   LDLCALC 59 08/13/2013   TRIG 122 08/13/2013   No results found for: DDIMER No results found for: LIPASE, AMYLASE  No results found for: VITAMINB12, FOLATE, FERRITIN, TIBC, IRON, RETICCTPCT  Echo: 08/26/13 - Left ventricle: The cavity size was normal. Wall thickness was increased in a pattern of mild LVH. Systolic function was normal. The estimated ejection fraction was in the range of 60% to 65%. Wall motion was normal; there were no regional wall motion abnormalities. Features are consistent with a pseudonormal left ventricular filling pattern, with concomitant abnormal relaxation and increased filling pressure (grade 2 diastolic dysfunction). - Aortic valve: A 60mbioprosthesis was present. Overall normal function. No significant regurgitation. No perivalvular leak. Mean gradient: 162mHg (S). LVOT/AV VTI ratio 0.68. - Mitral valve: Moderately thickened leaflets . A 283mnular ring prosthesis was present. No significant regurgitation. Pressure half-time: 71m72mean gradient: 11mm65m(D). Valve area by pressure half-time: 2.34cm^2. Valve area by continuity equation (using LVOT flow): 1.44cm^2. Somewhat  discordant measurements - pressure half-time suggests normal valve area, although gradients and continuity measures indicate potentially  mild to moderate stenotic flow. Leaflet motion is not well seen. - Left atrium: The atrium was severely dilated. - Right ventricle: Poorly visualized. Systolic function was normal. - Right atrium: The atrium was mildly dilated. - Tricuspid valve: Trivial regurgitation. Peak RV-RA gradient: 25m Hg (S). - Inferior vena cava: Not visualized. Unable to estimate CVP. - Pericardium, extracardiac: There was no pericardial effusion.  ECG:  Per my review, tracing from this presentation shows NSR with normal axis, QT prolongation, no evidence of AV block, no significant ST or Twave changes  Radiology:  Dg Chest 2 View  11/03/2015  CLINICAL DATA:  Fatigue, chills and shortness of breath 3 weeks. EXAM: CHEST  2 VIEW COMPARISON:  05/06/2011 FINDINGS: The sternotomy wires unchanged. Lungs are adequately inflated with without consolidation or effusion. Stable cardiomegaly. Prosthetic mitral valve unchanged. Remainder exam is unchanged. The IMPRESSION: No active cardiopulmonary disease. Electronically Signed   By: DMarin OlpM.D.   On: 11/03/2015 17:12    ASSESSMENT AND PLAN:     [name] ACREEis a 65y.o. male with a complicated PMH most significant for CAD, HFpEF, E. Faecalis endocarditis c/b severe AI and now s/p bioprosthetic AVR, MR s/p MV repair, DVTs on apixaban who presents with concerns for recurrent endocarditis w/ several weeks of fevers/chills/sweats.  1) Fever; c/f recurrent infectious endocarditis in the setting of positive blood cultures.  Antibiotics as per ID recs, currently vancomycin.  ESR/CRP. Please keep NPO for TEE.  2) Elevated Troponin; no ECG changes concerning for subendocardial ischemia and CP free, defer systemic anticoagulation or DAPT, trend troponin as you are  3) CAD; low clinical suspicion for acute plaque rupture  event, continue ASA at current dosing schedule  4) HTN; continue lisionpril 130mPO QDAY and metoprolol 2550mO BID  5) dyslipidemia; continue simvastatin 73m50m QHS  6) Microcytic Anemia; would consider LDH/haptoglobin/fractionated bilirubin/peripheral blood smear given potential for mechanical hemolysis with bioprosthetic valve endocarditis   Signed: PaulClayborne Dana 11/04/2015 1:24 AM

## 2015-11-04 NOTE — Progress Notes (Signed)
Followed up with Judeth CornfieldStephanie in lab and she advised me to call to have troponins added to morning lab sample. Lab employee at ext. [phone] advised me that [employer] system did not show the troponins and I would have to reorder, but she could go ahead and add the first one to the sample that they have after I reorder.

## 2015-11-04 NOTE — Progress Notes (Signed)
PROGRESS NOTE                                                                                                                                                                                                             Patient Demographics:    [name] [name], is a 65 y.o. male, DOB - 10-18-1950, ZOX:[id number]  Admit date - 11/03/2015   Admitting Physician Levie Heritage, DO  Outpatient Primary MD for the patient is TAPPER,DAVID B, MD  LOS - 1  Chief Complaint  Patient presents with  . Abnormal Lab       Brief Narrative  65 y.o. 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 The patient has had chills and fatigue for over 3 weeks, with shortness of breath that worsens with activity and improves with rest   Subjective:    [name] He reports no new complaints   Assessment  & Plan :   1 positive blood culture  Cardiology on board  Continue Vancomycin  ID on board and assisting #2 elevated troponin  Cards consulted and will manage #3 history of endocarditis #4 status post aortic valve replacement #5 status post mitral valve replacement #6 anemia  Hemoccult testing ordered   Code Status : full  Family Communication  : None at bedside  Disposition Plan  : pending improvement in condition  Consults  :  Cards, ID  Procedures  : None  DVT Prophylaxis  : on xarelto  Lab Results  Component Value Date   PLT 193 11/04/2015    Antibiotics  :  Per ID currently on VANc  Anti-infectives    Start     Dose/Rate Route Frequency Ordered Stop   11/04/15 0800  vancomycin (VANCOCIN)  IVPB 1000 mg/200 mL premix     1,000 mg 200 mL/hr over 60 Minutes Intravenous Every 12 hours 11/03/15 2000     11/03/15 2100  vancomycin (VANCOCIN) IVPB 1000 mg/200 mL premix     1,000 mg 200 mL/hr over 60 Minutes Intravenous  Once 11/03/15 1949 11/03/15 2140   11/03/15 2000  vancomycin (VANCOCIN) IVPB 1000 mg/200 mL premix     1,000 mg 200 mL/hr over 60 Minutes Intravenous  Once 11/03/15 1949 11/03/15 2300        Objective:   Filed Vitals:   11/03/15 2200 11/04/15 0018 11/04/15 0528 11/04/15 1404  BP: 148/61  115/81 108/80  Pulse: 106 106 89 93  Temp: 98.6 F (37 C)  98.2 F (36.8 C) 98.4 F (36.9 C)  TempSrc: Oral  Oral Oral  Resp: 23 20 20 19   Height:      Weight:      SpO2: 100% 96% 90% 98%    Wt Readings from Last 3 Encounters:  11/03/15 119.75 kg (264 lb)  11/02/15 119.75 kg (264 lb)  10/12/15 134.265 kg (296 lb)     Intake/Output Summary (Last 24 hours) at 11/04/15 1604 Last data filed at 11/04/15 1405  Gross per 24 hour  Intake    240 ml  Output   1850 ml  Net  -1610 ml     Physical Exam  Awake Alert, Oriented X 3,  Supple Neck,No JVD, No cervical lymphadenopathy appreciated.  Symmetrical Chest wall movement, Good air movement bilaterally, CTAB RRR,No Gallops, no cyanosis +ve B.Sounds, Abd Soft, No tenderness, No organomegaly appriciated, No rebound - guarding or rigidity. No Cyanosis    Data Review:    CBC  Recent Labs Lab 11/03/15 1722 11/04/15 0436  WBC 7.4 7.9  HGB 8.4* 7.7*  HCT 28.3* 26.6*  PLT 180 193  MCV 77.5* 76.4*  MCH 23.0* 22.1*  MCHC 29.7* 28.9*  RDW 22.0* 22.0*  LYMPHSABS 0.8  --   MONOABS 0.3  --   EOSABS 0.0  --   BASOSABS 0.0  --     Chemistries   Recent Labs Lab 11/03/15 1722 11/04/15 0436  NA 130* 134*  K 3.5 3.6  CL 100* 99*  CO2 23 23  GLUCOSE 156* 156*  BUN 12 10  CREATININE 0.83 0.89  CALCIUM 8.4* 8.5*  AST 29  --   ALT 19  --   ALKPHOS 119  --   BILITOT 0.9  --     ------------------------------------------------------------------------------------------------------------------ No results for input(s): CHOL, HDL, LDLCALC, TRIG, CHOLHDL, LDLDIRECT in the last 72 hours.  Lab Results  Component Value Date   HGBA1C 7.3* 07/28/2015   ------------------------------------------------------------------------------------------------------------------ No results for input(s): TSH, T4TOTAL, T3FREE, THYROIDAB in the last 72 hours.  Invalid input(s): FREET3 ------------------------------------------------------------------------------------------------------------------ No results for input(s): VITAMINB12, FOLATE, FERRITIN, TIBC, IRON, RETICCTPCT in the last 72 hours.  Coagulation profile  Recent Labs Lab 11/03/15 1722  INR 1.21    No results for input(s): DDIMER in the last 72 hours.  Cardiac Enzymes  Recent Labs Lab 11/04/15 0907 11/04/15 1100 11/04/15 1445  TROPONINI 0.26* 0.28* 0.27*   ------------------------------------------------------------------------------------------------------------------    Component Value Date/Time   BNP 398.0* 11/03/2015 1722    Inpatient Medications  Scheduled Meds: . aspirin  325 mg Oral Daily  . diazepam  5 mg Oral QHS  . [START ON 11/05/2015] Exenatide ER  2 mg Subcutaneous Q Sun  . FLUoxetine  40 mg Oral Daily  . furosemide  40 mg Oral Daily  . gabapentin  300 mg Oral QHS  . insulin aspart  0-15 Units Subcutaneous TID WC  . levothyroxine  112 mcg Oral QAC breakfast  . lisinopril  10 mg Oral Daily  . metFORMIN  500 mg Oral BID WC  . metoprolol tartrate  25 mg Oral QPM  . multivitamins with iron  1 tablet Oral Daily  . pantoprazole  40 mg Oral Daily  . pramipexole  0.25 mg Oral QHS  . rivaroxaban  20 mg Oral Q supper  . simvastatin  40 mg Oral QHS  . sodium chloride flush  3 mL Intravenous Q12H  . vancomycin  1,000 mg Intravenous Q12H   Continuous Infusions:  PRN Meds:.acetaminophen **OR**  acetaminophen, HYDROcodone-acetaminophen, ondansetron **OR** ondansetron (ZOFRAN) IV, polyethylene glycol  Micro Results Recent Results (from the past 240 hour(s))  Culture, blood (routine x 2)     Status: None (Preliminary result)   Collection Time: 11/03/15  5:22 PM  Result Value Ref Range Status   Specimen Description LEFT ANTECUBITAL  Final   Special Requests BOTTLES DRAWN AEROBIC AND ANAEROBIC 8CC EACH  Final   Culture  Setup Time   Final    GRAM POSITIVE COCCI IN CHAINS Gram Stain Report Called to,Read Back By and Verified With: CLOER K. MC AT 1304 ON [id number] BY THOMPSON S.    Culture PENDING  Incomplete   Report Status PENDING  Incomplete  Culture, blood (routine x 2)     Status: None (Preliminary result)   Collection Time: 11/03/15  5:27 PM  Result Value Ref Range Status   Specimen Description BLOOD LEFT HAND  Final   Special Requests BOTTLES DRAWN AEROBIC AND ANAEROBIC 6CC EACH  Final   Culture  Setup Time   Final    GRAM POSITIVE COCCI IN CHAINS Gram Stain Report Called to,Read Back By and Verified With: CLOER K. MC AT 1304 ON [id number] BY THOMPSON S.    Culture PENDING  Incomplete   Report Status PENDING  Incomplete  Blood culture (routine x 2)     Status: None (Preliminary result)   Collection Time: 11/03/15  7:47 PM  Result Value Ref Range Status   Specimen Description BLOOD RIGHT ARM  Final   Special Requests BOTTLES DRAWN AEROBIC AND ANAEROBIC 6CC EACH  Final   Culture  Setup Time   Final    GRAM POSITIVE COCCI IN CHAINS Gram Stain Report Called to,Read Back By and Verified With: CLOER K. MC AT 1304 ON [id number] BY THOMPSON S.    Culture PENDING  Incomplete   Report Status PENDING  Incomplete  Blood culture (routine x 2)     Status: None (Preliminary result)   Collection Time: 11/03/15  7:55 PM  Result Value Ref Range Status   Specimen Description BLOOD LEFT ARM  Final   Special Requests BOTTLES DRAWN AEROBIC AND ANAEROBIC [facility] EACH  Final   Culture  Setup Time    Final    GRAM POSITIVE COCCI IN CHAINS Gram Stain Report Called to,Read Back By and Verified With: CLOER K. MC AT 1304 ON [id number] BY THOMPSON S.    Culture PENDING  Incomplete   Report Status PENDING  Incomplete    Radiology Reports Dg Chest 2 View  11/03/2015  CLINICAL DATA:  Fatigue, chills and shortness of breath 3 weeks. EXAM: CHEST  2 VIEW COMPARISON:  05/06/2011 FINDINGS: The sternotomy wires unchanged. Lungs are adequately inflated with without consolidation or effusion. Stable cardiomegaly. Prosthetic mitral valve unchanged. Remainder exam is unchanged. The IMPRESSION: No active cardiopulmonary disease. Electronically Signed   By: Elberta Fortisaniel  Boyle M.D.   On: 11/03/2015 17:12   Mr Cervical Spine Wo Contrast  10/27/2015  CLINICAL DATA:  Neck pain radiating to the right arm for 1 month. EXAM: MRI CERVICAL SPINE WITHOUT CONTRAST TECHNIQUE: Multiplanar, multisequence MR imaging of the cervical spine  was performed. No intravenous contrast was administered. COMPARISON:  04/19/2011 FINDINGS: The study is motion degraded despite repeating some sequences. Axial series are moderately motion degraded, particularly limiting assessment for neural foraminal stenosis. Vertebral alignment is normal. Vertebral bone marrow signal is moderately diminished diffusely, nonspecific but most likely reflective of patient's underlying anemia. No significant vertebral marrow edema is seen. Vertebral body heights are preserved. Patchy T2 hyperintensities are present in the pons, nonspecific but likely reflective of chronic small vessel ischemia in this patient with hypertension and diabetes. The cervical spinal cord is normal in caliber without definite signal abnormality identified within limitations of motion artifact. Paraspinal soft tissues are unremarkable. C2-3:  No disc herniation or stenosis. C3-4: Mild disc bulging and minimal facet arthrosis without spinal stenosis or evidence of significant neural foraminal stenosis  allowing for motion artifact. C4-5: Mild disc bulging mild disc bulging without spinal canal or definite neural foraminal stenosis. C5-6:  Mild disc bulging without stenosis. C6-7: Circumferential disc osteophyte complex greater to the left results in mild right and moderate left neural foraminal stenosis, grossly similar to the prior study. No spinal stenosis. C7-T1: Mild disc bulging and mild facet arthrosis result in mild left neural foraminal stenosis, similar to the prior study. No spinal stenosis. IMPRESSION: 1. Motion degraded examination without definite right-sided neural impingement identified. 2. Mild right and moderate left neural foraminal stenosis at C6-7, similar to the prior study. 3. Diffusely diminished bone marrow signal intensity compatible with known underlying anemia. Electronically Signed   By: Sebastian Ache M.D.   On: 10/27/2015 08:25    Time Spent in minutes  25   Penny Pia M.D on 11/04/2015 at 4:04 PM  Between 7am to 7pm - Pager - [phone]  After 7pm go to [email or web address] - password [facility]  [facility] -  Office  [phone]

## 2015-11-04 NOTE — Progress Notes (Signed)
  Echocardiogram 2D Echocardiogram with Definity has been performed.  [name] SavoyCasey N Joshiah [name] 11/04/2015, 5:09 PM

## 2015-11-04 NOTE — Consult Note (Signed)
[facility]    Date of Admission:  11/03/2015   Day 1 vancomycin       Reason for Consult: Streptococcal bacteremia    Referring Physician: Dr. Candelaria Celeste Primary Care Physician: Dr. Wyvonnia Lora  Principal Problem:   Streptococcal bacteremia Active Problems:   S/P AVR (aortic valve replacement)   S/P mitral valve repair   Fatigue   Chills   Unintentional weight loss   History of endocarditis   Obstructive sleep apnea   Type 2 diabetes mellitus with vascular disease (HCC)   Elevated troponin I level   Anemia   . aspirin  325 mg Oral Daily  . diazepam  5 mg Oral QHS  . [START ON 11/05/2015] Exenatide ER  2 mg Subcutaneous Q Sun  . FLUoxetine  40 mg Oral Daily  . furosemide  40 mg Oral Daily  . gabapentin  300 mg Oral QHS  . insulin aspart  0-15 Units Subcutaneous TID WC  . levothyroxine  112 mcg Oral QAC breakfast  . lisinopril  10 mg Oral Daily  . metFORMIN  500 mg Oral BID WC  . metoprolol tartrate  25 mg Oral QPM  . multivitamins with iron  1 tablet Oral Daily  . pantoprazole  40 mg Oral Daily  . pramipexole  0.25 mg Oral QHS  . rivaroxaban  20 mg Oral Q supper  . simvastatin  40 mg Oral QHS  . sodium chloride flush  3 mL Intravenous Q12H  . vancomycin  1,000 mg Intravenous Q12H    Recommendations: 1. 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 I will continue vancomycin for now pending final blood cultures. I will follow-up tomorrow.    HPI: [name] [name] is a 65 y.o. male with a history of enterococcal endocarditis who underwent aortic valve replacement with a porcine bioprosthetic valve and mitral valve repair in 2012. He saw his primary care physician in February that she is complaining of fatigue. He was noted to have no anemia started on iron but did not respond. He has been increasingly fatigued  since that time. He has had anorexia and 52 pounds of unintentional weight loss. About 3 weeks ago he began to develop flushing, rigors, nausea and worsening dyspnea on exertion. He saw his cardiologist 2 days ago and one set of blood cultures were obtained. Preliminary results suggest that they may be growing nutritionally deficient streptococci. This prompted his admission last night.   Review of Systems: Review of Systems  Constitutional: Positive for chills, weight loss and malaise/fatigue. Negative for fever and diaphoresis.  HENT: Negative for congestion and sore throat.   Eyes: Negative for blurred vision.  Respiratory: Positive for sputum production. Negative for cough and shortness of breath.   Cardiovascular: Negative for chest pain.  Gastrointestinal: Positive for nausea. Negative for heartburn, vomiting, abdominal pain, diarrhea and constipation.  Genitourinary: Negative for dysuria and frequency.  Musculoskeletal: Negative for myalgias and joint pain.  Skin: Negative for rash.  Neurological: Positive for weakness. Negative for dizziness, focal weakness and headaches.    Past Medical History  Diagnosis Date  . Obesity   . Hypertension   . Depression   . 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 Diabetes mellitus   . Hyperlipidemia   . Coronary artery disease (CAD) excluded 2008, 2011  Mild coronary plaque.  w/ normal LV function. repeated cardiac cath on 07/2009  . 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 History of endocarditis     status post Enterococcus faecalis, secondary to chronic injections with low molecular weight heparin  . Deep venous thrombosis (HCC)     recurrent DVT and PE, status post IVC filter  . OSA (obstructive sleep apnea)     CPAP  . CHF (congestive heart failure) (HCC)   . Dyspnea     with exertion    . GERD (gastroesophageal reflux disease)   . Arthritis   . Restless leg syndrome     Social History  Substance Use Topics  . Smoking status: Former Smoker -- 1.00 packs/day for 6 years    Types: Cigarettes    Quit date: 01/22/1974  . Smokeless tobacco: Never Used  . Alcohol Use: No    Family History  Problem Relation Age of Onset  . Hypertension Father   . Hyperlipidemia Father   . CAD Father   . Diabetes Mother   . Hypertension Mother   . Cancer Mother   . CAD Mother   . Coronary artery disease Mother   . Stroke Mother   . Kidney disease Mother   . Hyperlipidemia Mother   . Diabetes Maternal Grandmother   . Diabetes Maternal Grandfather   . Diabetes Paternal Grandmother   . Diabetes Paternal Grandfather    Allergies  Allergen Reactions  . Invokana [Canagliflozin] Rash    OBJECTIVE: Blood pressure 115/81, pulse 89, temperature 98.2 F (36.8 C), temperature source Oral, resp. rate 20, height 5\' 11"  (1.803 m), weight 264 lb (119.75 kg), SpO2 90 %.  Physical Exam  Constitutional: He is oriented to person, place, and time.  He is alert and comfortable. He is sitting up in a chair eating lunch. His daughter is visiting.  HENT:  Mouth/Throat: No oropharyngeal exudate.  Eyes: Conjunctivae are normal.  No conjunctival hemorrhages.  Neck: Neck supple.  Cardiovascular: Normal rate and regular rhythm.   Murmur heard. 2/6 systolic murmur heard best at the right upper sternal border.  Pulmonary/Chest: Effort normal and breath sounds normal. He has no wheezes. He has no rales.  Abdominal: Soft. He exhibits no mass. There is no tenderness.  Musculoskeletal: Normal range of motion. He exhibits no edema or tenderness.  Neurological: He is alert and oriented to person, place, and time.  Skin: No rash noted.  No splinter hemorrhages.  Psychiatric: Mood and affect normal.    Lab Results Lab Results  Component Value Date   WBC 7.9 11/04/2015   HGB 7.7* 11/04/2015   HCT  26.6* 11/04/2015   MCV 76.4* 11/04/2015   PLT 193 11/04/2015    Lab Results  Component Value Date   CREATININE 0.89 11/04/2015   BUN 10 11/04/2015   NA 134* 11/04/2015   K 3.6 11/04/2015   CL 99* 11/04/2015   CO2 23 11/04/2015    Lab Results  Component Value Date   ALT 19 11/03/2015   AST 29 11/03/2015   ALKPHOS 119 11/03/2015   BILITOT 0.9 11/03/2015     Microbiology: Recent Results (from the past 240 hour(s))  Culture, blood (routine x 2)     Status: None (Preliminary result)   Collection Time: 11/03/15  5:22 PM  Result Value Ref Range Status   Specimen Description LEFT ANTECUBITAL  Final   Special Requests BOTTLES DRAWN AEROBIC AND ANAEROBIC 8CC EACH  Final   Culture NO GROWTH < 24 HOURS  Final   Report Status PENDING  Incomplete  Culture, blood (routine x 2)     Status: None (Preliminary result)   Collection Time: 11/03/15  5:27 PM  Result Value Ref Range Status   Specimen Description BLOOD LEFT HAND  Final   Special Requests BOTTLES DRAWN AEROBIC AND ANAEROBIC 6CC EACH  Final   Culture NO GROWTH < 24 HOURS  Final   Report Status PENDING  Incomplete  Blood culture (routine x 2)     Status: None (Preliminary result)   Collection Time: 11/03/15  7:47 PM  Result Value Ref Range Status   Specimen Description BLOOD RIGHT ARM  Final   Special Requests BOTTLES DRAWN AEROBIC AND ANAEROBIC 6CC EACH  Final   Culture NO GROWTH < 24 HOURS  Final   Report Status PENDING  Incomplete  Blood culture (routine x 2)     Status: None (Preliminary result)   Collection Time: 11/03/15  7:55 PM  Result Value Ref Range Status   Specimen Description BLOOD LEFT ARM  Final   Special Requests BOTTLES DRAWN AEROBIC AND ANAEROBIC [facility]6CC EACH  Final   Culture NO GROWTH < 24 HOURS  Final   Report Status PENDING  Incomplete    Cliffton AstersJohn Suraj Ramdass, MD [facility] [phone] pager   [phone] cell 11/04/2015, 1:21 PM

## 2015-11-05 DIAGNOSIS — B954 Other streptococcus as the cause of diseases classified elsewhere: Secondary | ICD-10-CM

## 2015-11-05 DIAGNOSIS — Z9889 Other specified postprocedural states: Secondary | ICD-10-CM

## 2015-11-05 LAB — CBC
HCT: 24.5 % — ABNORMAL LOW (ref 39.0–52.0)
HEMOGLOBIN: 7.3 g/dL — AB (ref 13.0–17.0)
MCH: 23 pg — AB (ref 26.0–34.0)
MCHC: 29.8 g/dL — ABNORMAL LOW (ref 30.0–36.0)
MCV: 77.3 fL — ABNORMAL LOW (ref 78.0–100.0)
PLATELETS: 187 10*3/uL (ref 150–400)
RBC: 3.17 MIL/uL — AB (ref 4.22–5.81)
RDW: 22 % — ABNORMAL HIGH (ref 11.5–15.5)
WBC: 6.4 10*3/uL (ref 4.0–10.5)

## 2015-11-05 LAB — SURGICAL PCR SCREEN
MRSA, PCR: NEGATIVE
STAPHYLOCOCCUS AUREUS: NEGATIVE

## 2015-11-05 LAB — ECHOCARDIOGRAM COMPLETE
Height: 71 in
WEIGHTICAEL: 4224 [oz_av]

## 2015-11-05 LAB — CULTURE, BLOOD (SINGLE)

## 2015-11-05 LAB — GLUCOSE, CAPILLARY
GLUCOSE-CAPILLARY: 129 mg/dL — AB (ref 65–99)
GLUCOSE-CAPILLARY: 113 mg/dL — AB (ref 65–99)
GLUCOSE-CAPILLARY: 110 mg/dL — AB (ref 65–99)
Glucose-Capillary: 133 mg/dL — ABNORMAL HIGH (ref 65–99)

## 2015-11-05 MED ORDER — SODIUM CHLORIDE 0.9 % IV SOLN
2.0000 g | INTRAVENOUS | Status: DC
  Administered 2015-11-05 – 2015-11-08 (×19): 2 g via INTRAVENOUS
  Filled 2015-11-05 (×23): qty 2000

## 2015-11-05 MED ORDER — GENTAMICIN SULFATE 40 MG/ML IJ SOLN
210.0000 mg | Freq: Three times a day (TID) | INTRAVENOUS | Status: DC
  Administered 2015-11-05 – 2015-11-06 (×2): 210 mg via INTRAVENOUS
  Filled 2015-11-05 (×5): qty 5.25

## 2015-11-05 MED ORDER — GENTAMICIN SULFATE 40 MG/ML IJ SOLN
2.5000 mg/kg | Freq: Once | INTRAVENOUS | Status: AC
  Administered 2015-11-05: 230 mg via INTRAVENOUS
  Filled 2015-11-05: qty 5.75

## 2015-11-05 NOTE — Progress Notes (Signed)
PROGRESS NOTE                                                                                                                                                                                                             Patient Demographics:    Keyon Liller, is a 65 y.o. male, DOB - September 04, 1950, ZOX:[id number]  Admit date - 11/03/2015   Admitting Physician Levie Heritage, DO  Outpatient Primary MD for the patient is TAPPER,DAVID B, MD  LOS - 2  Chief Complaint  Patient presents with  . Abnormal Lab       Brief Narrative  65 y.o. 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 The patient has had chills and fatigue for over 3 weeks, with shortness of breath that worsens with activity and improves with rest   Subjective:    Yasir Kitner He reports no new complaints, no acute issues overnight per my discussion with him.   Assessment  & Plan :   1 positive blood culture  Cardiology on board  Continue antibiotic regimen as per ID recs. Gentamicin and ampicillin currently  ID on board and assisting #2 elevated troponin  Cards consulted and will manage #3 history of endocarditis #4 status post aortic valve replacement #5 status post mitral valve replacement #6 anemia  Hemoccult testing ordered   Code Status : full  Family Communication  : None at bedside  Disposition Plan  : pending improvement in condition  Consults  :  Cards, ID  Procedures  : None  DVT Prophylaxis  : on xarelto  Lab Results  Component Value Date   PLT 187 11/05/2015    Antibiotics  :  Per ID see  above  Anti-infectives    Start     Dose/Rate Route Frequency Ordered Stop   11/05/15 2200  gentamicin (GARAMYCIN) 210 mg in dextrose 5 % 50 mL IVPB     210 mg 110.5 mL/hr over 30 Minutes Intravenous Every 8 hours 11/05/15 1356     11/05/15 1500  ampicillin (OMNIPEN) 2 g in sodium chloride 0.9 %  50 mL IVPB     2 g 150 mL/hr over 20 Minutes Intravenous Every 4 hours 11/05/15 1336     11/05/15 1400  gentamicin (GARAMYCIN) 230 mg in dextrose 5 % 50 mL IVPB     2.5 mg/kg  93.1 kg (Adjusted) 111.5 mL/hr over 30 Minutes Intravenous  Once 11/05/15 1356     11/04/15 0800  vancomycin (VANCOCIN) IVPB 1000 mg/200 mL premix  Status:  Discontinued     1,000 mg 200 mL/hr over 60 Minutes Intravenous Every 12 hours 11/03/15 2000 11/05/15 1336   11/03/15 2100  vancomycin (VANCOCIN) IVPB 1000 mg/200 mL premix     1,000 mg 200 mL/hr over 60 Minutes Intravenous  Once 11/03/15 1949 11/03/15 2140   11/03/15 2000  vancomycin (VANCOCIN) IVPB 1000 mg/200 mL premix     1,000 mg 200 mL/hr over 60 Minutes Intravenous  Once 11/03/15 1949 11/03/15 2300        Objective:   Filed Vitals:   11/04/15 1935 11/05/15 0520 11/05/15 1057 11/05/15 1338  BP: 138/91 127/79 102/68 122/79  Pulse: 95 83 88 87  Temp: 98.7 F (37.1 C) 98.7 F (37.1 C)  98.4 F (36.9 C)  TempSrc: Oral Oral  Oral  Resp: 19 19  24   Height:      Weight:      SpO2: 99% 88% 94% 94%    Wt Readings from Last 3 Encounters:  11/03/15 119.75 kg (264 lb)  11/02/15 119.75 kg (264 lb)  10/12/15 134.265 kg (296 lb)    No intake or output data in the 24 hours ending 11/05/15 1537   Physical Exam  Awake Alert, Oriented X 3,  Supple Neck,No JVD, No cervical lymphadenopathy appreciated.  Symmetrical Chest wall movement, Good air movement bilaterally, CTAB RRR,No Gallops, no cyanosis +ve B.Sounds, Abd Soft, No tenderness, No organomegaly appriciated, No rebound - guarding or rigidity. No Cyanosis    Data Review:    CBC  Recent  Labs Lab 11/03/15 1722 11/04/15 0436 11/05/15 0404  WBC 7.4 7.9 6.4  HGB 8.4* 7.7* 7.3*  HCT 28.3* 26.6* 24.5*  PLT 180 193 187  MCV 77.5* 76.4* 77.3*  MCH 23.0* 22.1* 23.0*  MCHC 29.7* 28.9* 29.8*  RDW 22.0* 22.0* 22.0*  LYMPHSABS 0.8  --   --   MONOABS 0.3  --   --   EOSABS 0.0  --   --   BASOSABS 0.0  --   --     Chemistries   Recent Labs Lab 11/03/15 1722 11/04/15 0436  NA 130* 134*  K 3.5 3.6  CL 100* 99*  CO2 23 23  GLUCOSE 156* 156*  BUN 12 10  CREATININE 0.83 0.89  CALCIUM 8.4* 8.5*  AST 29  --   ALT 19  --   ALKPHOS 119  --   BILITOT 0.9  --    ------------------------------------------------------------------------------------------------------------------ No results for input(s): CHOL, HDL, LDLCALC, TRIG, CHOLHDL, LDLDIRECT in the last 72 hours.  Lab Results  Component Value Date   HGBA1C 7.3* 07/28/2015   ------------------------------------------------------------------------------------------------------------------ No results for input(s): TSH, T4TOTAL, T3FREE, THYROIDAB in the last 72 hours.  Invalid input(s): FREET3 ------------------------------------------------------------------------------------------------------------------ No results for input(s): VITAMINB12, FOLATE, FERRITIN, TIBC, IRON, RETICCTPCT in the last 72 hours.  Coagulation profile  Recent Labs Lab 11/03/15 1722  INR 1.21    No results for input(s): DDIMER in the last 72 hours.  Cardiac Enzymes  Recent Labs Lab 11/04/15 0907 11/04/15 1100 11/04/15 1445  TROPONINI 0.26* 0.28* 0.27*   ------------------------------------------------------------------------------------------------------------------  Component Value Date/Time   BNP 398.0* 11/03/2015 1722    Inpatient Medications  Scheduled Meds: . ampicillin (OMNIPEN) IV  2 g Intravenous Q4H  . aspirin  325 mg Oral Daily  . diazepam  5 mg Oral QHS  . FLUoxetine  40 mg Oral Daily  . furosemide  40 mg  Oral Daily  . gabapentin  300 mg Oral QHS  . gentamicin  210 mg Intravenous Q8H  . gentamicin  2.5 mg/kg (Adjusted) Intravenous Once  . insulin aspart  0-15 Units Subcutaneous TID WC  . levothyroxine  112 mcg Oral QAC breakfast  . lisinopril  10 mg Oral Daily  . metFORMIN  500 mg Oral BID WC  . metoprolol tartrate  25 mg Oral QPM  . multivitamins with iron  1 tablet Oral Daily  . pantoprazole  40 mg Oral Daily  . pramipexole  0.25 mg Oral QHS  . rivaroxaban  20 mg Oral Q supper  . simvastatin  40 mg Oral QHS  . sodium chloride flush  3 mL Intravenous Q12H   Continuous Infusions:  PRN Meds:.acetaminophen **OR** acetaminophen, HYDROcodone-acetaminophen, ondansetron **OR** ondansetron (ZOFRAN) IV, polyethylene glycol  Micro Results Recent Results (from the past 240 hour(s))  Blood culture (routine single)     Status: None   Collection Time: 11/02/15  1:33 PM  Result Value Ref Range Status   Organism ID, Bacteria NUTRITIONALLY DEFICIENT STREPTOCOCCUS  Final    Comment: IDENTIFIED AS GRANULICATELLA ADIACENS Standardized susceptibility testing for this organism is not available. Gram Stain Report Called to,Read Back By and Verified With: CHRISTY Y 11/03/15 1330 BY SMITHERSJ   Culture, blood (routine x 2)     Status: None (Preliminary result)   Collection Time: 11/03/15  5:22 PM  Result Value Ref Range Status   Specimen Description LEFT ANTECUBITAL  Final   Special Requests BOTTLES DRAWN AEROBIC AND ANAEROBIC 8CC EACH  Final   Culture  Setup Time   Final    GRAM POSITIVE COCCI IN CHAINS Gram Stain Report Called to,Read Back By and Verified With: CLOER K. MC AT 1304 ON [id number] BY THOMPSON S.    Culture   Final    GRAM POSITIVE COCCI IDENTIFICATION AND SUSCEPTIBILITIES TO FOLLOW Performed at [facility]    Report Status PENDING  Incomplete  Culture, blood (routine x 2)     Status: None (Preliminary result)   Collection Time: 11/03/15  5:27 PM  Result Value Ref Range  Status   Specimen Description BLOOD LEFT HAND  Final   Special Requests BOTTLES DRAWN AEROBIC AND ANAEROBIC [facility] EACH  Final   Culture  Setup Time   Final    GRAM POSITIVE COCCI IN CHAINS Gram Stain Report Called to,Read Back By and Verified With: CLOER K. MC AT 1304 ON [id number] BY THOMPSON S.    Culture   Final    GRAM POSITIVE COCCI IDENTIFICATION AND SUSCEPTIBILITIES TO FOLLOW Performed at [facility]    Report Status PENDING  Incomplete  Blood culture (routine x 2)     Status: None (Preliminary result)   Collection Time: 11/03/15  7:47 PM  Result Value Ref Range Status   Specimen Description BLOOD RIGHT ARM  Final   Special Requests BOTTLES DRAWN AEROBIC AND ANAEROBIC [facility] EACH  Final   Culture  Setup Time   Final    GRAM POSITIVE COCCI IN CHAINS Gram Stain Report Called to,Read Back By and Verified With: CLOER K. MC AT 1304 ON [id number] BY THOMPSON  S.    Culture   Final    GRAM POSITIVE COCCI IDENTIFICATION AND SUSCEPTIBILITIES TO FOLLOW Performed at [facility]    Report Status PENDING  Incomplete  Blood culture (routine x 2)     Status: None (Preliminary result)   Collection Time: 11/03/15  7:55 PM  Result Value Ref Range Status   Specimen Description BLOOD LEFT ARM  Final   Special Requests BOTTLES DRAWN AEROBIC AND ANAEROBIC [facility] EACH  Final   Culture  Setup Time   Final    GRAM POSITIVE COCCI IN CHAINS Gram Stain Report Called to,Read Back By and Verified With: CLOER K. MC AT 1304 ON [id number] BY THOMPSON S.    Culture   Final    GRAM POSITIVE COCCI IDENTIFICATION AND SUSCEPTIBILITIES TO FOLLOW Performed at [facility]    Report Status PENDING  Incomplete  Surgical pcr screen     Status: None   Collection Time: 11/04/15  9:44 PM  Result Value Ref Range Status   MRSA, PCR NEGATIVE NEGATIVE Final   Staphylococcus aureus NEGATIVE NEGATIVE Final    Comment:        The Xpert SA Assay (FDA approved for NASAL specimens in patients over 21 years of  age), is one component of a comprehensive surveillance program.  Test performance has been validated by [facility] for patients greater than or equal to 7 year old. It is not intended to diagnose infection nor to guide or monitor treatment.     Radiology Reports Dg Chest 2 View  11/03/2015  CLINICAL DATA:  Fatigue, chills and shortness of breath 3 weeks. EXAM: CHEST  2 VIEW COMPARISON:  05/06/2011 FINDINGS: The sternotomy wires unchanged. Lungs are adequately inflated with without consolidation or effusion. Stable cardiomegaly. Prosthetic mitral valve unchanged. Remainder exam is unchanged. The IMPRESSION: No active cardiopulmonary disease. Electronically Signed   By: Elberta Fortis M.D.   On: 11/03/2015 17:12   Mr Cervical Spine Wo Contrast  10/27/2015  CLINICAL DATA:  Neck pain radiating to the right arm for 1 month. EXAM: MRI CERVICAL SPINE WITHOUT CONTRAST TECHNIQUE: Multiplanar, multisequence MR imaging of the cervical spine was performed. No intravenous contrast was administered. COMPARISON:  04/19/2011 FINDINGS: The study is motion degraded despite repeating some sequences. Axial series are moderately motion degraded, particularly limiting assessment for neural foraminal stenosis. Vertebral alignment is normal. Vertebral bone marrow signal is moderately diminished diffusely, nonspecific but most likely reflective of patient's underlying anemia. No significant vertebral marrow edema is seen. Vertebral body heights are preserved. Patchy T2 hyperintensities are present in the pons, nonspecific but likely reflective of chronic small vessel ischemia in this patient with hypertension and diabetes. The cervical spinal cord is normal in caliber without definite signal abnormality identified within limitations of motion artifact. Paraspinal soft tissues are unremarkable. C2-3:  No disc herniation or stenosis. C3-4: Mild disc bulging and minimal facet arthrosis without spinal stenosis or evidence of  significant neural foraminal stenosis allowing for motion artifact. C4-5: Mild disc bulging mild disc bulging without spinal canal or definite neural foraminal stenosis. C5-6:  Mild disc bulging without stenosis. C6-7: Circumferential disc osteophyte complex greater to the left results in mild right and moderate left neural foraminal stenosis, grossly similar to the prior study. No spinal stenosis. C7-T1: Mild disc bulging and mild facet arthrosis result in mild left neural foraminal stenosis, similar to the prior study. No spinal stenosis. IMPRESSION: 1. Motion degraded examination without definite right-sided neural impingement identified. 2. Mild right  and moderate left neural foraminal stenosis at C6-7, similar to the prior study. 3. Diffusely diminished bone marrow signal intensity compatible with known underlying anemia. Electronically Signed   By: Sebastian Ache M.D.   On: 10/27/2015 08:25    Time Spent in minutes  25   Penny Pia M.D on 11/05/2015 at 3:37 PM  Between 7am to 7pm - Pager - [phone]  After 7pm go to [email or web address] - password [facility]  [facility] -  Office  [phone]

## 2015-11-05 NOTE — Progress Notes (Signed)
Patient ID: [name] [name], male   DOB: 07-20-1950, 65 y.o.   MRN: [id number]         [facility]    Date of Admission:  11/03/2015           Day 2 vancomycin  Principal Problem:   Streptococcal bacteremia Active Problems:   S/P AVR (aortic valve replacement)   S/P mitral valve repair   Fatigue   Chills   Unintentional weight loss   History of endocarditis   Obstructive sleep apnea   Type 2 diabetes mellitus with vascular disease (HCC)   Elevated troponin I level   Anemia   . aspirin  325 mg Oral Daily  . diazepam  5 mg Oral QHS  . Exenatide ER  2 mg Subcutaneous Q Sun  . FLUoxetine  40 mg Oral Daily  . furosemide  40 mg Oral Daily  . gabapentin  300 mg Oral QHS  . insulin aspart  0-15 Units Subcutaneous TID WC  . levothyroxine  112 mcg Oral QAC breakfast  . lisinopril  10 mg Oral Daily  . metFORMIN  500 mg Oral BID WC  . metoprolol tartrate  25 mg Oral QPM  . multivitamins with iron  1 tablet Oral Daily  . pantoprazole  40 mg Oral Daily  . pramipexole  0.25 mg Oral QHS  . rivaroxaban  20 mg Oral Q supper  . simvastatin  40 mg Oral QHS  . sodium chloride flush  3 mL Intravenous Q12H  . vancomycin  1,000 mg Intravenous Q12H    SUBJECTIVE: He states that he is feeling a little bit stronger today.  Review of Systems: Review of Systems  Constitutional: Positive for weight loss and malaise/fatigue. Negative for fever, chills and diaphoresis.  HENT: Negative for sore throat.   Respiratory: Negative for cough, sputum production and shortness of breath.   Cardiovascular: Negative for chest pain.  Gastrointestinal: Negative for nausea, vomiting, abdominal pain and diarrhea.  Genitourinary: Negative for dysuria.  Musculoskeletal: Negative for myalgias and joint pain.  Skin: Negative for rash.  Neurological: Negative for headaches.    Past Medical History  Diagnosis Date  . Obesity   . Hypertension   . Depression   . 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 Diabetes mellitus   . Hyperlipidemia   . Coronary artery disease (CAD) excluded 2008, 2011    Mild coronary plaque.  w/ normal LV function. repeated cardiac cath on 07/2009  . 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 History of endocarditis     status post Enterococcus faecalis, secondary to chronic injections with low molecular weight heparin  . Deep venous thrombosis (HCC)     recurrent DVT and PE, status post IVC filter  . OSA (obstructive sleep apnea)     CPAP  . CHF (congestive heart failure) (HCC)   . Dyspnea     with exertion  . GERD (gastroesophageal reflux disease)   . Arthritis   . Restless leg syndrome     Social History  Substance Use Topics  . Smoking status: Former Smoker -- 1.00 packs/day for 6 years    Types: Cigarettes    Quit date: 01/22/1974  . Smokeless tobacco: Never Used  . Alcohol Use: No    Family History  Problem Relation Age of Onset  .  Hypertension Father   . Hyperlipidemia Father   . CAD Father   . Diabetes Mother   . Hypertension Mother   . Cancer Mother   . CAD Mother   . Coronary artery disease Mother   . Stroke Mother   . Kidney disease Mother   . Hyperlipidemia Mother   . Diabetes Maternal Grandmother   . Diabetes Maternal Grandfather   . Diabetes Paternal Grandmother   . Diabetes Paternal Grandfather    Allergies  Allergen Reactions  . Invokana [Canagliflozin] Rash    OBJECTIVE: Filed Vitals:   11/04/15 1822 11/04/15 1935 11/05/15 0520 11/05/15 1057  BP: 109/70 138/91 127/79 102/68  Pulse: 97 95 83 88  Temp:  98.7 F (37.1 C) 98.7 F (37.1 C)   TempSrc:  Oral Oral   Resp:  19 19   Height:      Weight:      SpO2:  99% 88% 94%   Body mass index is 36.84 kg/(m^2).  Physical Exam  Constitutional: He is oriented to person, place, and time.  He is  alert and comfortable sitting up in a chair.  HENT:  Mouth/Throat: No oropharyngeal exudate.  Eyes: Conjunctivae are normal.  Cardiovascular: Normal rate and regular rhythm.   Murmur heard. No change in 2/6 systolic murmur heard best at the right upper sternal border.  Pulmonary/Chest: Breath sounds normal. He has no wheezes. He has no rales.  Abdominal: Soft. He exhibits no mass. There is no tenderness.  Musculoskeletal: Normal range of motion.  Neurological: He is alert and oriented to person, place, and time.  Skin: No rash noted.  Psychiatric: Mood and affect normal.    Lab Results Lab Results  Component Value Date   WBC 6.4 11/05/2015   HGB 7.3* 11/05/2015   HCT 24.5* 11/05/2015   MCV 77.3* 11/05/2015   PLT 187 11/05/2015    Lab Results  Component Value Date   CREATININE 0.89 11/04/2015   BUN 10 11/04/2015   NA 134* 11/04/2015   K 3.6 11/04/2015   CL 99* 11/04/2015   CO2 23 11/04/2015    Lab Results  Component Value Date   ALT 19 11/03/2015   AST 29 11/03/2015   ALKPHOS 119 11/03/2015   BILITOT 0.9 11/03/2015     Microbiology: Recent Results (from the past 240 hour(s))  Blood culture (routine single)     Status: None   Collection Time: 11/02/15  1:33 PM  Result Value Ref Range Status   Organism ID, Bacteria NUTRITIONALLY DEFICIENT STREPTOCOCCUS  Final    Comment: IDENTIFIED AS GRANULICATELLA ADIACENS Standardized susceptibility testing for this organism is not available. Gram Stain Report Called to,Read Back By and Verified With: CHRISTY Y 11/03/15 1330 BY SMITHERSJ   Culture, blood (routine x 2)     Status: None (Preliminary result)   Collection Time: 11/03/15  5:22 PM  Result Value Ref Range Status   Specimen Description LEFT ANTECUBITAL  Final   Special Requests BOTTLES DRAWN AEROBIC AND ANAEROBIC 8CC EACH  Final   Culture  Setup Time   Final    GRAM POSITIVE COCCI IN CHAINS Gram Stain Report Called to,Read Back By and Verified With: CLOER K. MC AT  1304 ON [id number] BY THOMPSON S.    Culture   Final    GRAM POSITIVE COCCI IDENTIFICATION AND SUSCEPTIBILITIES TO FOLLOW Performed at [facility]    Report Status PENDING  Incomplete  Culture, blood (routine x 2)     Status: None (Preliminary  result)   Collection Time: 11/03/15  5:27 PM  Result Value Ref Range Status   Specimen Description BLOOD LEFT HAND  Final   Special Requests BOTTLES DRAWN AEROBIC AND ANAEROBIC [facility]6CC EACH  Final   Culture  Setup Time   Final    GRAM POSITIVE COCCI IN CHAINS Gram Stain Report Called to,Read Back By and Verified With: CLOER K. MC AT 1304 ON [id number] BY THOMPSON S.    Culture   Final    GRAM POSITIVE COCCI IDENTIFICATION AND SUSCEPTIBILITIES TO FOLLOW Performed at [facility]    Report Status PENDING  Incomplete  Blood culture (routine x 2)     Status: None (Preliminary result)   Collection Time: 11/03/15  7:47 PM  Result Value Ref Range Status   Specimen Description BLOOD RIGHT ARM  Final   Special Requests BOTTLES DRAWN AEROBIC AND ANAEROBIC [facility]6CC EACH  Final   Culture  Setup Time   Final    GRAM POSITIVE COCCI IN CHAINS Gram Stain Report Called to,Read Back By and Verified With: CLOER K. MC AT 1304 ON [id number] BY THOMPSON S.    Culture   Final    GRAM POSITIVE COCCI IDENTIFICATION AND SUSCEPTIBILITIES TO FOLLOW Performed at [facility]    Report Status PENDING  Incomplete  Blood culture (routine x 2)     Status: None (Preliminary result)   Collection Time: 11/03/15  7:55 PM  Result Value Ref Range Status   Specimen Description BLOOD LEFT ARM  Final   Special Requests BOTTLES DRAWN AEROBIC AND ANAEROBIC [facility] EACH  Final   Culture  Setup Time   Final    GRAM POSITIVE COCCI IN CHAINS Gram Stain Report Called to,Read Back By and Verified With: CLOER K. MC AT 1304 ON [id number] BY THOMPSON S.    Culture   Final    GRAM POSITIVE COCCI IDENTIFICATION AND SUSCEPTIBILITIES TO FOLLOW Performed at [facility]     Report  Status PENDING  Incomplete  Surgical pcr screen     Status: None   Collection Time: 11/04/15  9:44 PM  Result Value Ref Range Status   MRSA, PCR NEGATIVE NEGATIVE Final   Staphylococcus aureus NEGATIVE NEGATIVE Final    Comment:        The Xpert SA Assay (FDA approved for NASAL specimens in patients over 66 years of age), is one component of a comprehensive surveillance program.  Test performance has been validated by [facility] for patients greater than or equal to 66 year old. It is not intended to diagnose infection nor to guide or monitor treatment.    Transthoracic Echocardiography 11/04/2015 ------------------------------------------------------------------- Study Conclusions  - Left ventricle: The cavity size was normal. There was mild  concentric hypertrophy. Systolic function was normal. The  estimated ejection fraction was in the range of 60% to 65%. Wall  motion was normal; there were no regional wall motion  abnormalities. - Ventricular septum: Septal motion showed paradox. - Aortic valve: A bioprosthesis was present and functioning  normally. Valve area (VTI): 1.07 cm^2. Valve area (Vmax): 1.02  cm^2. Valve area (Vmean): 0.9 cm^2. - Mitral valve: Prior procedures included surgical repair. An  annular ring prosthesis was present. Valve area by pressure  half-time: 1.29 cm^2. - Left atrium: The atrium was moderately to severely dilated. - Atrial septum: No defect or patent foramen ovale was identified.  Impressions:  - No vegetations were identified, but cannot be ruled out on the  basis of this examination. Consider  TEE if endocarditis is  suspected.  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 All blood cultures obtained on 11/02/2015 and 11/03/2015 are positive. Although he has no new murmurs on exam and there  are no vegetations or new valvular abnormalities noted on transthoracic echocardiogram I am still highly suspicious that he has endocarditis. I will change his antibiotic therapy to IV ampicillin and gentamicin. I will repeat blood cultures today. I agree with TEE.  PLAN: 1. Change vancomycin to IV ampicillin and gentamicin 2. Repeat blood cultures 3. Do not place PICC until blood cultures are negative 4. TEE  Cliffton Asters, MD [facility] [phone] pager   [phone] cell 11/05/2015, 1:27 PM

## 2015-11-05 NOTE — Progress Notes (Signed)
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  He been having fevers and chills, admission was recommended and he declined. Outpatient blood cultures were obtained and are positive by report although I do not see this.  Appreciate Dr Emerson [facility] eval  Past Medical History  Diagnosis Date  . Obesity   . Hypertension   . Depression   . 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 Diabetes mellitus   . Hyperlipidemia   . Coronary artery disease (CAD) excluded 2008, 2011    Mild coronary plaque.  w/ normal LV function. repeated cardiac cath on 07/2009  . 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 History of endocarditis     status post Enterococcus faecalis, secondary to chronic injections with low molecular weight heparin  . Deep venous thrombosis (HCC)     recurrent DVT and PE, status post IVC filter  . OSA (obstructive sleep apnea)     CPAP  . CHF (congestive heart failure) (HCC)   . Dyspnea     with exertion  . GERD (gastroesophageal reflux disease)   . Arthritis   . Restless leg syndrome     Scheduled Meds:  Scheduled Meds: . aspirin  325 mg Oral Daily  . diazepam  5 mg Oral QHS  . Exenatide ER  2 mg Subcutaneous Q Sun  . FLUoxetine  40 mg Oral Daily  . furosemide  40 mg Oral Daily  . gabapentin  300 mg Oral QHS  . insulin aspart  0-15 Units Subcutaneous TID WC  . levothyroxine  112 mcg Oral QAC breakfast  . lisinopril  10 mg Oral Daily  . metFORMIN  500 mg Oral BID WC  . metoprolol tartrate  25 mg Oral QPM  . multivitamins with iron  1 tablet Oral Daily  . pantoprazole  40 mg Oral Daily  . pramipexole  0.25 mg Oral QHS  . rivaroxaban  20 mg  Oral Q supper  . simvastatin  40 mg Oral QHS  . sodium chloride flush  3 mL Intravenous Q12H  . vancomycin  1,000 mg Intravenous Q12H   Continuous Infusions:  acetaminophen **OR** acetaminophen, HYDROcodone-acetaminophen, ondansetron **OR** ondansetron (ZOFRAN) IV, polyethylene glycol    PHYSICAL EXAM Filed Vitals:   11/04/15 1404 11/04/15 1822 11/04/15 1935 11/05/15 0520  BP: 108/80 109/70 138/91 127/79  Pulse: 93 97 95 83  Temp: 98.4 F (36.9 C)  98.7 F (37.1 C) 98.7 F (37.1 C)  TempSrc: Oral  Oral Oral  Resp: 19  19 19   Height:      Weight:      SpO2: 98%  99% 88%    Well developed and nourished in no acute distress HENT normal Neck supple with JVP-flat Clear Regular rate and rhythm, 2/6 murmur most consitent with aortic outflow, no AI and doubt MR Abd-soft with active BS No Clubbing cyanosis edema Skin-warm and dry A & Oriented  Grossly normal sensory and motor function   TELEMETRY: Reviewed telemetry pt in sinus:    Intake/Output Summary (Last 24 hours) at 11/05/15 1054 Last data filed at 11/04/15 1405  Gross per 24 hour  Intake  240 ml  Output      0 ml  Net    240 ml    LABS: Basic Metabolic Panel:  Recent Labs Lab 11/03/15 1722 11/04/15 0436  NA 130* 134*  K 3.5 3.6  CL 100* 99*  CO2 23 23  GLUCOSE 156* 156*  BUN 12 10  CREATININE 0.83 0.89  CALCIUM 8.4* 8.5*   Cardiac Enzymes:  Recent Labs  11/04/15 0907 11/04/15 1100 11/04/15 1445  TROPONINI 0.26* 0.28* 0.27*   CBC:  Recent Labs Lab 11/03/15 1722 11/04/15 0436 11/05/15 0404  WBC 7.4 7.9 6.4  NEUTROABS 5.9  --   --   HGB 8.4* 7.7* 7.3*  HCT 28.3* 26.6* 24.5*  MCV 77.5* 76.4* 77.3*  PLT 180 193 187   PROTIME:  Recent Labs  11/03/15 1722  LABPROT 15.5*  INR 1.21   Liver Function Tests:  Recent Labs  11/03/15 1722  AST 29  ALT 19  ALKPHOS 119  BILITOT 0.9  PROT 7.9  ALBUMIN 3.3*   No results for input(s): LIPASE, AMYLASE in the last 72  hours. BNP: BNP (last 3 results)  Recent Labs  11/03/15 1722  BNP 398.0*    ProBNP (last 3 results) No results for input(s): PROBNP in the last 8760 hours.  D-Dimer: No results for input(s): DDIMER in the last 72 hours. Hemoglobin A1C:   ASSESSMENT AND PLAN:  Principal Problem:   Streptococcal bacteremia Active Problems:   Obstructive sleep apnea   S/P AVR (aortic valve replacement)   S/P mitral valve repair   History of endocarditis   Type 2 diabetes mellitus with vascular disease (HCC)   Fatigue   Chills   Elevated troponin I level   Anemia   Unintentional weight loss  Will need TEE will make NPO   Continue antibiotics   Signed, Sherryl Manges MD  11/05/2015

## 2015-11-05 NOTE — Progress Notes (Signed)
Pharmacy Antibiotic Note  [name] [name] is a 65 y.o. male admitted on 11/03/2015 with bacteremia and suspected endocarditis.  Pharmacy has been consulted for gentamicin dosing.  Per notes, murmur heard at cardiologist's office, no new murmur and no vegetations on TEE, but ID concerned for endocarditis. Patient with history of bioprosthetic mitral and aortic valve replacement in 2012.  Plan: -ampicillin 2g IV q4h per MD -gentamicin loading dose with 230mg  IV x1, then maintenance dose of 210mg  IV q8h -GT before 4th dose (ordered for 5/15 at 1330)- goal trough <1 for synergy dosing -follow renal function, daily BMETs ordered  Height: 5\' 11"  (180.3 cm) Weight: 264 lb (119.75 kg) IBW/kg (Calculated) : 75.3  Temp (24hrs), Avg:98.6 F (37 C), Min:98.4 F (36.9 C), Max:98.7 F (37.1 C)   Recent Labs Lab 11/03/15 1722 11/04/15 0436 11/05/15 0404  WBC 7.4 7.9 6.4  CREATININE 0.83 0.89  --     Estimated Creatinine Clearance: 109 mL/min (by C-G formula based on Cr of 0.89).    Allergies  Allergen Reactions  . Invokana [Canagliflozin] Rash    Antimicrobials this admission: Vancomycin 5/12 >> 5/14 Gentamicin 5/14>> Ampicillin 5/14>>  Dose adjustments this admission: n/a  Microbiology results: Outpatient Blood cx: Granulicatella adaicens(unable to see in EPIC) 5/12 Blood cx x4: 4/4 GPC  Thank you for allowing pharmacy to be a part of this patient's care.  [name] [name] D. [name] [name], PharmD, BCPS Clinical Pharmacist Pager: [phone] 11/05/2015 1:59 PM

## 2015-11-06 ENCOUNTER — Inpatient Hospital Stay (HOSPITAL_COMMUNITY): Payer: Medicare Other

## 2015-11-06 ENCOUNTER — Encounter (HOSPITAL_COMMUNITY): Payer: Self-pay

## 2015-11-06 ENCOUNTER — Encounter (HOSPITAL_COMMUNITY)
Admission: EM | Disposition: A | Discharge status: Home Health | Payer: Self-pay | Source: Home / Self Care | Attending: Family Medicine

## 2015-11-06 DIAGNOSIS — R7881 Bacteremia: Secondary | ICD-10-CM

## 2015-11-06 DIAGNOSIS — I340 Nonrheumatic mitral (valve) insufficiency: Secondary | ICD-10-CM

## 2015-11-06 DIAGNOSIS — K0889 Other specified disorders of teeth and supporting structures: Secondary | ICD-10-CM

## 2015-11-06 DIAGNOSIS — Z8679 Personal history of other diseases of the circulatory system: Secondary | ICD-10-CM

## 2015-11-06 DIAGNOSIS — Z954 Presence of other heart-valve replacement: Secondary | ICD-10-CM

## 2015-11-06 HISTORY — PX: TEE WITHOUT CARDIOVERSION: SHX5443

## 2015-11-06 LAB — CULTURE, BLOOD (ROUTINE X 2)

## 2015-11-06 LAB — GLUCOSE, CAPILLARY
GLUCOSE-CAPILLARY: 93 mg/dL (ref 65–99)
Glucose-Capillary: 116 mg/dL — ABNORMAL HIGH (ref 65–99)
Glucose-Capillary: 109 mg/dL — ABNORMAL HIGH (ref 65–99)
Glucose-Capillary: 134 mg/dL — ABNORMAL HIGH (ref 65–99)

## 2015-11-06 LAB — BASIC METABOLIC PANEL
Anion gap: 8 (ref 5–15)
BUN: 14 mg/dL (ref 6–20)
CO2: 26 mmol/L (ref 22–32)
CREATININE: 1.02 mg/dL (ref 0.61–1.24)
Calcium: 8.4 mg/dL — ABNORMAL LOW (ref 8.9–10.3)
Chloride: 99 mmol/L — ABNORMAL LOW (ref 101–111)
GFR calc Af Amer: >60 mL/min (ref >60)
Glucose, Bld: 133 mg/dL — ABNORMAL HIGH (ref 65–99)
Potassium: 3.5 mmol/L (ref 3.5–5.1)
SODIUM: 133 mmol/L — AB (ref 135–145)

## 2015-11-06 LAB — GENTAMICIN LEVEL, TROUGH: GENTAMICIN TR: 1.1 ug/mL (ref 0.5–2.0)

## 2015-11-06 SURGERY — ECHOCARDIOGRAM, TRANSESOPHAGEAL
Anesthesia: Moderate Sedation

## 2015-11-06 MED ORDER — GENTAMICIN IN SALINE 1.6-0.9 MG/ML-% IV SOLN
80.0000 mg | Freq: Three times a day (TID) | INTRAVENOUS | Status: DC
  Filled 2015-11-06: qty 50

## 2015-11-06 MED ORDER — LIDOCAINE VISCOUS 2 % MT SOLN
OROMUCOSAL | Status: DC | PRN
  Administered 2015-11-06: 10 mL via OROMUCOSAL

## 2015-11-06 MED ORDER — FENTANYL CITRATE (PF) 100 MCG/2ML IJ SOLN
INTRAMUSCULAR | Status: AC
  Filled 2015-11-06: qty 2

## 2015-11-06 MED ORDER — FENTANYL CITRATE (PF) 100 MCG/2ML IJ SOLN
INTRAMUSCULAR | Status: DC | PRN
  Administered 2015-11-06 (×2): 25 ug via INTRAVENOUS

## 2015-11-06 MED ORDER — LIDOCAINE VISCOUS 2 % MT SOLN
OROMUCOSAL | Status: AC
  Filled 2015-11-06: qty 15

## 2015-11-06 MED ORDER — MIDAZOLAM HCL 10 MG/2ML IJ SOLN
INTRAMUSCULAR | Status: DC | PRN
  Administered 2015-11-06: 1 mg via INTRAVENOUS
  Administered 2015-11-06 (×2): 2 mg via INTRAVENOUS

## 2015-11-06 MED ORDER — DIPHENHYDRAMINE HCL 50 MG/ML IJ SOLN
INTRAMUSCULAR | Status: AC
  Filled 2015-11-06: qty 1

## 2015-11-06 MED ORDER — DIPHENHYDRAMINE HCL 50 MG/ML IJ SOLN
INTRAMUSCULAR | Status: DC | PRN
  Administered 2015-11-06: 25 mg via INTRAVENOUS

## 2015-11-06 MED ORDER — MIDAZOLAM HCL 5 MG/ML IJ SOLN
INTRAMUSCULAR | Status: AC
  Filled 2015-11-06: qty 2

## 2015-11-06 NOTE — Progress Notes (Signed)
PROGRESS NOTE                                                                                                                                                                                                             Patient Demographics:    [name] [name], is a 65 y.o. male, DOB - 12/26/1950, ZOX:[id number]  Admit date - 11/03/2015   Admitting Physician Levie Heritage, DO  Outpatient Primary MD for the patient is TAPPER,DAVID B, MD  LOS - 3  Chief Complaint  Patient presents with  . Abnormal Lab       Brief Narrative  65 y.o. 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 The patient has had chills and fatigue for over 3 weeks, with shortness of breath that worsens with activity and improves with rest   Subjective:    [name] [name] He reports no new complaints, no acute issues overnight per my discussion with him.   Assessment  & Plan :   1 positive blood culture  Cardiology on board  Continue antibiotic regimen as per ID recs. Gentamicin and ampicillin currently  ID on board and assisting #2 elevated troponin  Cards consulted and managing. #3 history of endocarditis - For TEE today. Most likely will require CVTS evaluation. Will await results.  #4 status post aortic valve replacement #5 status post mitral valve replacement #6 anemia  Hemoccult testing ordered   Code Status : full  Family Communication  : None at bedside  Disposition Plan  : pending improvement in condition  Consults  :  Cards, ID  Procedures  : None  DVT Prophylaxis  : on xarelto  Lab Results  Component  Value Date   PLT 187 11/05/2015    Antibiotics  :  Gentamicin, ampicillin  Anti-infectives    Start     Dose/Rate Route Frequency Ordered Stop   11/05/15 2200  gentamicin (GARAMYCIN) 210 mg in dextrose 5 % 50 mL IVPB     210 mg 110.5 mL/hr over 30 Minutes Intravenous Every 8 hours 11/05/15 1356     11/05/15  1500  ampicillin (OMNIPEN) 2 g in sodium chloride 0.9 % 50 mL IVPB     2 g 150 mL/hr over 20 Minutes Intravenous Every 4 hours 11/05/15 1336     11/05/15 1400  gentamicin (GARAMYCIN) 230 mg in dextrose 5 % 50 mL IVPB     2.5 mg/kg  93.1 kg (Adjusted) 111.5 mL/hr over 30 Minutes Intravenous  Once 11/05/15 1356 11/05/15 1541   11/04/15 0800  vancomycin (VANCOCIN) IVPB 1000 mg/200 mL premix  Status:  Discontinued     1,000 mg 200 mL/hr over 60 Minutes Intravenous Every 12 hours 11/03/15 2000 11/05/15 1336   11/03/15 2100  vancomycin (VANCOCIN) IVPB 1000 mg/200 mL premix     1,000 mg 200 mL/hr over 60 Minutes Intravenous  Once 11/03/15 1949 11/03/15 2140   11/03/15 2000  vancomycin (VANCOCIN) IVPB 1000 mg/200 mL premix     1,000 mg 200 mL/hr over 60 Minutes Intravenous  Once 11/03/15 1949 11/03/15 2300        Objective:   Filed Vitals:   11/05/15 1731 11/05/15 1959 11/06/15 0350 11/06/15 1028  BP: 99/65 91/67 108/78 120/74  Pulse: 91 79 76   Temp:  98.3 F (36.8 C) 98.1 F (36.7 C)   TempSrc:  Oral Oral   Resp:  20 18   Height:      Weight:      SpO2:  90% 95%     Wt Readings from Last 3 Encounters:  11/03/15 119.75 kg (264 lb)  11/02/15 119.75 kg (264 lb)  10/12/15 134.265 kg (296 lb)    No intake or output data in the 24 hours ending 11/06/15 1222   Physical Exam  Awake Alert, Oriented X 3,  Supple Neck,No JVD, No cervical lymphadenopathy appreciated.  Symmetrical Chest wall movement, Good air movement bilaterally, CTAB RRR,No Gallops, no cyanosis +ve B.Sounds, Abd Soft, No tenderness, No organomegaly appriciated, No rebound - guarding or rigidity. No  Cyanosis    Data Review:    CBC  Recent Labs Lab 11/03/15 1722 11/04/15 0436 11/05/15 0404  WBC 7.4 7.9 6.4  HGB 8.4* 7.7* 7.3*  HCT 28.3* 26.6* 24.5*  PLT 180 193 187  MCV 77.5* 76.4* 77.3*  MCH 23.0* 22.1* 23.0*  MCHC 29.7* 28.9* 29.8*  RDW 22.0* 22.0* 22.0*  LYMPHSABS 0.8  --   --   MONOABS 0.3  --   --   EOSABS 0.0  --   --   BASOSABS 0.0  --   --     Chemistries   Recent Labs Lab 11/03/15 1722 11/04/15 0436 11/06/15 0326  NA 130* 134* 133*  K 3.5 3.6 3.5  CL 100* 99* 99*  CO2 23 23 26   GLUCOSE 156* 156* 133*  BUN 12 10 14   CREATININE 0.83 0.89 1.02  CALCIUM 8.4* 8.5* 8.4*  AST 29  --   --   ALT 19  --   --   ALKPHOS 119  --   --   BILITOT 0.9  --   --    ------------------------------------------------------------------------------------------------------------------ No results for input(s): CHOL, HDL, LDLCALC, TRIG, CHOLHDL, LDLDIRECT in the last 72 hours.  Lab Results  Component Value Date   HGBA1C 7.3* 07/28/2015   ------------------------------------------------------------------------------------------------------------------ No results for input(s): TSH, T4TOTAL, T3FREE, THYROIDAB in the last 72 hours.  Invalid input(s): FREET3 ------------------------------------------------------------------------------------------------------------------ No results for input(s): VITAMINB12, FOLATE, FERRITIN, TIBC, IRON, RETICCTPCT in the last 72 hours.  Coagulation profile  Recent Labs Lab 11/03/15 1722  INR 1.21  No results for input(s): DDIMER in the last 72 hours.  Cardiac Enzymes  Recent Labs Lab 11/04/15 0907 11/04/15 1100 11/04/15 1445  TROPONINI 0.26* 0.28* 0.27*   ------------------------------------------------------------------------------------------------------------------    Component Value Date/Time   BNP 398.0* 11/03/2015 1722    Inpatient Medications  Scheduled Meds: . ampicillin (OMNIPEN) IV  2 g Intravenous Q4H    . aspirin  325 mg Oral Daily  . diazepam  5 mg Oral QHS  . FLUoxetine  40 mg Oral Daily  . furosemide  40 mg Oral Daily  . gabapentin  300 mg Oral QHS  . gentamicin  210 mg Intravenous Q8H  . insulin aspart  0-15 Units Subcutaneous TID WC  . levothyroxine  112 mcg Oral QAC breakfast  . lisinopril  10 mg Oral Daily  . metFORMIN  500 mg Oral BID WC  . metoprolol tartrate  25 mg Oral QPM  . multivitamins with iron  1 tablet Oral Daily  . pantoprazole  40 mg Oral Daily  . pramipexole  0.25 mg Oral QHS  . rivaroxaban  20 mg Oral Q supper  . simvastatin  40 mg Oral QHS  . sodium chloride flush  3 mL Intravenous Q12H   Continuous Infusions:  PRN Meds:.acetaminophen **OR** acetaminophen, HYDROcodone-acetaminophen, ondansetron **OR** ondansetron (ZOFRAN) IV, polyethylene glycol  Micro Results Recent Results (from the past 240 hour(s))  Blood culture (routine single)     Status: None   Collection Time: 11/02/15  1:33 PM  Result Value Ref Range Status   Organism ID, Bacteria NUTRITIONALLY DEFICIENT STREPTOCOCCUS  Final    Comment: IDENTIFIED AS GRANULICATELLA ADIACENS Standardized susceptibility testing for this organism is not available. Gram Stain Report Called to,Read Back By and Verified With: CHRISTY Y 11/03/15 1330 BY SMITHERSJ   Culture, blood (routine x 2)     Status: Abnormal   Collection Time: 11/03/15  5:22 PM  Result Value Ref Range Status   Specimen Description LEFT ANTECUBITAL  Final   Special Requests BOTTLES DRAWN AEROBIC AND ANAEROBIC 8CC EACH  Final   Culture  Setup Time   Final    GRAM POSITIVE COCCI IN CHAINS Gram Stain Report Called to,Read Back By and Verified With: CLOER K. MC AT 1304 ON [id number] BY THOMPSON S.    Culture (A)  Final    MICROAEROPHILIC STREPTOCOCCI Standardized susceptibility testing for this organism is not available. Performed at [facility]    Report Status 11/06/2015 FINAL  Final  Culture, blood (routine x 2)     Status: Abnormal    Collection Time: 11/03/15  5:27 PM  Result Value Ref Range Status   Specimen Description BLOOD LEFT HAND  Final   Special Requests BOTTLES DRAWN AEROBIC AND ANAEROBIC [facility] EACH  Final   Culture  Setup Time   Final    GRAM POSITIVE COCCI IN CHAINS Gram Stain Report Called to,Read Back By and Verified With: CLOER K. MC AT 1304 ON [id number] BY THOMPSON S.    Culture (A)  Final    MICROAEROPHILIC STREPTOCOCCI Standardized susceptibility testing for this organism is not available. Performed at [facility]    Report Status 11/06/2015 FINAL  Final  Blood culture (routine x 2)     Status: Abnormal   Collection Time: 11/03/15  7:47 PM  Result Value Ref Range Status   Specimen Description BLOOD RIGHT ARM  Final   Special Requests BOTTLES DRAWN AEROBIC AND ANAEROBIC [facility] EACH  Final   Culture  Setup Time  Final    GRAM POSITIVE COCCI IN CHAINS Gram Stain Report Called to,Read Back By and Verified With: CLOER K. MC AT 1304 ON [id number] BY THOMPSON S.    Culture (A)  Final    MICROAEROPHILIC STREPTOCOCCI Standardized susceptibility testing for this organism is not available. Performed at [facility]    Report Status 11/06/2015 FINAL  Final  Blood culture (routine x 2)     Status: Abnormal   Collection Time: 11/03/15  7:55 PM  Result Value Ref Range Status   Specimen Description BLOOD LEFT ARM  Final   Special Requests BOTTLES DRAWN AEROBIC AND ANAEROBIC [facility] EACH  Final   Culture  Setup Time   Final    GRAM POSITIVE COCCI IN CHAINS Gram Stain Report Called to,Read Back By and Verified With: CLOER K. MC AT 1304 ON [id number] BY THOMPSON S.    Culture (A)  Final    MICROAEROPHILIC STREPTOCOCCI Standardized susceptibility testing for this organism is not available. Performed at [facility]    Report Status 11/06/2015 FINAL  Final  Surgical pcr screen     Status: None   Collection Time: 11/04/15  9:44 PM  Result Value Ref Range Status   MRSA, PCR NEGATIVE NEGATIVE Final    Staphylococcus aureus NEGATIVE NEGATIVE Final    Comment:        The Xpert SA Assay (FDA approved for NASAL specimens in patients over 2 years of age), is one component of a comprehensive surveillance program.  Test performance has been validated by [facility] for patients greater than or equal to 63 year old. It is not intended to diagnose infection nor to guide or monitor treatment.     Radiology Reports Dg Chest 2 View  11/03/2015  CLINICAL DATA:  Fatigue, chills and shortness of breath 3 weeks. EXAM: CHEST  2 VIEW COMPARISON:  05/06/2011 FINDINGS: The sternotomy wires unchanged. Lungs are adequately inflated with without consolidation or effusion. Stable cardiomegaly. Prosthetic mitral valve unchanged. Remainder exam is unchanged. The IMPRESSION: No active cardiopulmonary disease. Electronically Signed   By: Elberta Fortis M.D.   On: 11/03/2015 17:12   Mr Cervical Spine Wo Contrast  10/27/2015  CLINICAL DATA:  Neck pain radiating to the right arm for 1 month. EXAM: MRI CERVICAL SPINE WITHOUT CONTRAST TECHNIQUE: Multiplanar, multisequence MR imaging of the cervical spine was performed. No intravenous contrast was administered. COMPARISON:  04/19/2011 FINDINGS: The study is motion degraded despite repeating some sequences. Axial series are moderately motion degraded, particularly limiting assessment for neural foraminal stenosis. Vertebral alignment is normal. Vertebral bone marrow signal is moderately diminished diffusely, nonspecific but most likely reflective of patient's underlying anemia. No significant vertebral marrow edema is seen. Vertebral body heights are preserved. Patchy T2 hyperintensities are present in the pons, nonspecific but likely reflective of chronic small vessel ischemia in this patient with hypertension and diabetes. The cervical spinal cord is normal in caliber without definite signal abnormality identified within limitations of motion artifact. Paraspinal soft  tissues are unremarkable. C2-3:  No disc herniation or stenosis. C3-4: Mild disc bulging and minimal facet arthrosis without spinal stenosis or evidence of significant neural foraminal stenosis allowing for motion artifact. C4-5: Mild disc bulging mild disc bulging without spinal canal or definite neural foraminal stenosis. C5-6:  Mild disc bulging without stenosis. C6-7: Circumferential disc osteophyte complex greater to the left results in mild right and moderate left neural foraminal stenosis, grossly similar to the prior study. No spinal stenosis. C7-T1: Mild disc  bulging and mild facet arthrosis result in mild left neural foraminal stenosis, similar to the prior study. No spinal stenosis. IMPRESSION: 1. Motion degraded examination without definite right-sided neural impingement identified. 2. Mild right and moderate left neural foraminal stenosis at C6-7, similar to the prior study. 3. Diffusely diminished bone marrow signal intensity compatible with known underlying anemia. Electronically Signed   By: Sebastian AcheAllen  Grady M.D.   On: 10/27/2015 08:25    Time Spent in minutes  25   Penny PiaVEGA, Delesia Martinek M.D on 11/06/2015 at 12:22 PM  Between 7am to 7pm - Pager - [phone]  After 7pm go to [email or web address] - password [email or web address]  [facility] -  Office  [phone]

## 2015-11-06 NOTE — Interval H&P Note (Signed)
History and Physical Interval Note:  11/06/2015 10:37 AM  [name] [name]  has presented today for surgery, with the diagnosis of bacteremia  The various methods of treatment have been discussed with the patient and family. After consideration of risks, benefits and other options for treatment, the patient has consented to  Procedure(s): TRANSESOPHAGEAL ECHOCARDIOGRAM (TEE) (N/A) as a surgical intervention .  The patient's history has been reviewed, patient examined, no change in status, stable for surgery.  I have reviewed the patient's chart and labs.  Questions were answered to the patient's satisfaction.  Yahshua Thibault C. Duke Salviaandolph, MD, [facility] CenterFACC

## 2015-11-06 NOTE — Progress Notes (Addendum)
Pharmacy Antibiotic Note  [name] [name] is a 65 y.o. male admitted on 11/03/2015 with bacteremia and suspected endocarditis.  Pharmacy has been consulted for gentamicin dosing. -gentamicin trough= 1.1 at ~ 1pm (last dose before this level appears to be on 5/15 at ~ 3pm) -WBC= 6.4, afeb, SCr= 1.02 -dose was given after the trough was collected  Goals Gentamicin pk: 3-4 Gentamicin trough: < 1   Plan: -A dose was given today after the level was collected and an elevated peak is anticipated -Will consider restarting gentamicin when trough is ~ 0.5 -Random gent level in am -Will follow renal function, cultures and clinical progress    Height: 5\' 11"  (180.3 cm) Weight: 264 lb (119.75 kg) IBW/kg (Calculated) : 75.3  Temp (24hrs), Avg:98.1 F (36.7 C), Min:97.9 F (36.6 C), Max:98.3 F (36.8 C)   Recent Labs Lab 11/03/15 1722 11/04/15 0436 11/05/15 0404 11/06/15 0326 11/06/15 1317  WBC 7.4 7.9 6.4  --   --   CREATININE 0.83 0.89  --  1.02  --   GENTTROUGH  --   --   --   --  1.1    Estimated Creatinine Clearance: 95.1 mL/min (by C-G formula based on Cr of 1.02).    Allergies  Allergen Reactions  . Invokana [Canagliflozin] Rash    Antimicrobials this admission: Vancomycin 5/12 >> 5/14 Gentamicin 5/14>> Ampicillin 5/14>>  Microbiology results: Outpatient Blood cx: Granulicatella adaicens(unable to see in EPIC) 5/12 Blood cx x4: 4/4 GPC  Thank you for allowing pharmacy to be a part of this patient's care.  Harland GermanAndrew Carlette Palmatier, Pharm D 11/06/2015 6:41 PM

## 2015-11-06 NOTE — Telephone Encounter (Signed)
Not my patient.  DH

## 2015-11-06 NOTE — Progress Notes (Signed)
INFECTIOUS DISEASE PROGRESS NOTE  ID: [name] [name] is a [age] y.o. male with  Principal Problem:   Streptococcal bacteremia Active Problems:   Obstructive sleep apnea   S/P AVR (aortic valve replacement)   S/P mitral valve repair   History of endocarditis   Type 2 diabetes mellitus with vascular disease (HCC)   Fatigue   Chills   Elevated troponin I level   Anemia   Unintentional weight loss  Subjective: Denies CP, SOB, recent dental work, difficulties with antibiotics.   Abtx:  Anti-infectives    Start     Dose/Rate Route Frequency Ordered Stop   11/05/15 2200  gentamicin (GARAMYCIN) 210 mg in dextrose 5 % 50 mL IVPB     210 mg 110.5 mL/hr over 30 Minutes Intravenous Every 8 hours 11/05/15 1356     11/05/15 1500  ampicillin (OMNIPEN) 2 g in sodium chloride 0.9 % 50 mL IVPB     2 g 150 mL/hr over 20 Minutes Intravenous Every 4 hours 11/05/15 1336     11/05/15 1400  gentamicin (GARAMYCIN) 230 mg in dextrose 5 % 50 mL IVPB     2.5 mg/kg  93.1 kg (Adjusted) 111.5 mL/hr over 30 Minutes Intravenous  Once 11/05/15 1356 11/05/15 1541   11/04/15 0800  vancomycin (VANCOCIN) IVPB 1000 mg/200 mL premix  Status:  Discontinued     1,000 mg 200 mL/hr over 60 Minutes Intravenous Every 12 hours 11/03/15 2000 11/05/15 1336   11/03/15 2100  vancomycin (VANCOCIN) IVPB 1000 mg/200 mL premix     1,000 mg 200 mL/hr over 60 Minutes Intravenous  Once 11/03/15 1949 11/03/15 2140   11/03/15 2000  vancomycin (VANCOCIN) IVPB 1000 mg/200 mL premix     1,000 mg 200 mL/hr over 60 Minutes Intravenous  Once 11/03/15 1949 11/03/15 2300      Medications:  Scheduled: . ampicillin (OMNIPEN) IV  2 g Intravenous Q4H  . aspirin  325 mg Oral Daily  . diazepam  5 mg Oral QHS  . FLUoxetine  40 mg Oral Daily  . furosemide  40 mg Oral Daily  . gabapentin  300 mg Oral QHS  . gentamicin  210 mg Intravenous Q8H  . insulin aspart  0-15 Units Subcutaneous TID WC  . levothyroxine  112 mcg Oral QAC breakfast    . lisinopril  10 mg Oral Daily  . metFORMIN  500 mg Oral BID WC  . metoprolol tartrate  25 mg Oral QPM  . multivitamins with iron  1 tablet Oral Daily  . pantoprazole  40 mg Oral Daily  . pramipexole  0.25 mg Oral QHS  . rivaroxaban  20 mg Oral Q supper  . simvastatin  40 mg Oral QHS  . sodium chloride flush  3 mL Intravenous Q12H    Objective: Vital signs in last 24 hours: Temp:  [98.1 F (36.7 C)-98.4 F (36.9 C)] 98.1 F (36.7 C) (05/15 0350) Pulse Rate:  [76-91] 76 (05/15 0350) Resp:  [18-24] 18 (05/15 0350) BP: (91-122)/(65-79) 108/78 mmHg (05/15 0350) SpO2:  [90 %-95 %] 95 % (05/15 0350)   General appearance: alert and no distress Resp: clear to auscultation bilaterally Cardio: regular rate and rhythm GI: normal findings: bowel sounds normal and soft, non-tender  Lab Results  Recent Labs  11/04/15 0436 11/05/15 0404 11/06/15 0326  WBC 7.9 6.4  --   HGB 7.7* 7.3*  --   HCT 26.6* 24.5*  --   NA 134*  --  133*  K 3.6  --  3.5  CL 99*  --  99*  CO2 23  --  26  BUN 10  --  14  CREATININE 0.89  --  1.02   Liver Panel  Recent Labs  11/03/15 1722  PROT 7.9  ALBUMIN 3.3*  AST 29  ALT 19  ALKPHOS 119  BILITOT 0.9   Sedimentation Rate  Recent Labs  11/04/15 0436  ESRSEDRATE 68*   C-Reactive Protein  Recent Labs  11/03/15 1945 11/04/15 0436  CRP 11.3* 11.7*    Microbiology: Recent Results (from the past 240 hour(s))  Blood culture (routine single)     Status: None   Collection Time: 11/02/15  1:33 PM  Result Value Ref Range Status   Organism ID, Bacteria NUTRITIONALLY DEFICIENT STREPTOCOCCUS  Final    Comment: IDENTIFIED AS GRANULICATELLA ADIACENS Standardized susceptibility testing for this organism is not available. Gram Stain Report Called to,Read Back By and Verified With: CHRISTY Y 11/03/15 1330 BY SMITHERSJ   Culture, blood (routine x 2)     Status: None (Preliminary result)   Collection Time: 11/03/15  5:22 PM  Result Value Ref  Range Status   Specimen Description LEFT ANTECUBITAL  Final   Special Requests BOTTLES DRAWN AEROBIC AND ANAEROBIC 8CC EACH  Final   Culture  Setup Time   Final    GRAM POSITIVE COCCI IN CHAINS Gram Stain Report Called to,Read Back By and Verified With: CLOER K. MC AT 1304 ON [id number] BY THOMPSON S.    Culture   Final    GRAM POSITIVE COCCI IDENTIFICATION AND SUSCEPTIBILITIES TO FOLLOW Performed at [facility]    Report Status PENDING  Incomplete  Culture, blood (routine x 2)     Status: None (Preliminary result)   Collection Time: 11/03/15  5:27 PM  Result Value Ref Range Status   Specimen Description BLOOD LEFT HAND  Final   Special Requests BOTTLES DRAWN AEROBIC AND ANAEROBIC [facility] EACH  Final   Culture  Setup Time   Final    GRAM POSITIVE COCCI IN CHAINS Gram Stain Report Called to,Read Back By and Verified With: CLOER K. MC AT 1304 ON [id number] BY THOMPSON S.    Culture   Final    GRAM POSITIVE COCCI IDENTIFICATION AND SUSCEPTIBILITIES TO FOLLOW Performed at [facility]    Report Status PENDING  Incomplete  Blood culture (routine x 2)     Status: None (Preliminary result)   Collection Time: 11/03/15  7:47 PM  Result Value Ref Range Status   Specimen Description BLOOD RIGHT ARM  Final   Special Requests BOTTLES DRAWN AEROBIC AND ANAEROBIC [facility] EACH  Final   Culture  Setup Time   Final    GRAM POSITIVE COCCI IN CHAINS Gram Stain Report Called to,Read Back By and Verified With: CLOER K. MC AT 1304 ON [id number] BY THOMPSON S.    Culture   Final    GRAM POSITIVE COCCI IDENTIFICATION AND SUSCEPTIBILITIES TO FOLLOW Performed at [facility]    Report Status PENDING  Incomplete  Blood culture (routine x 2)     Status: None (Preliminary result)   Collection Time: 11/03/15  7:55 PM  Result Value Ref Range Status   Specimen Description BLOOD LEFT ARM  Final   Special Requests BOTTLES DRAWN AEROBIC AND ANAEROBIC [facility] EACH  Final   Culture  Setup Time   Final     GRAM POSITIVE COCCI IN CHAINS Gram Stain Report Called to,Read Back By and Verified With: CLOER K.  MC AT 1304 ON [id number] BY THOMPSON S.    Culture   Final    GRAM POSITIVE COCCI IDENTIFICATION AND SUSCEPTIBILITIES TO FOLLOW Performed at [facility]    Report Status PENDING  Incomplete  Surgical pcr screen     Status: None   Collection Time: 11/04/15  9:44 PM  Result Value Ref Range Status   MRSA, PCR NEGATIVE NEGATIVE Final   Staphylococcus aureus NEGATIVE NEGATIVE Final    Comment:        The Xpert SA Assay (FDA approved for NASAL specimens in patients over 31 years of age), is one component of a comprehensive surveillance program.  Test performance has been validated by [facility] for patients greater than or equal to 97 year old. It is not intended to diagnose infection nor to guide or monitor treatment.     Studies/Results: No results found.   Assessment/Plan: Streptococcal bacteremia/Granulicatella 5/5 Previous Endocarditis Prev AVR and MCR (porcine) Poor dentition  Please ask dental to see Cr slightly up on gent.  Will watch Await TEE.  Will most likely need CVTS eval Repeat BCx (5-14) pending.   Total days of antibiotics: 3 (amp and gent)          [name] [name] Infectious Diseases (pager) [phone] [email or web address] 11/06/2015, 9:21 AM  LOS: 3 days

## 2015-11-06 NOTE — Progress Notes (Signed)
Utilization review completed.  

## 2015-11-06 NOTE — Progress Notes (Signed)
  Echocardiogram Echocardiogram Transesophageal has been performed.  [name] SavoyCasey N Elvie [name] 11/06/2015, 4:30 PM

## 2015-11-06 NOTE — Care Management Important Message (Signed)
Important Message  Patient Details  Name: [name] PearlSteven C [name] MRN: [id number] Date of Birth: 08/09/1950   Medicare Important Message Given:  Yes    Darrold SpanWebster, Giorgi Debruin Hall, RN 11/06/2015, 2:54 PM

## 2015-11-06 NOTE — CV Procedure (Signed)
Brief TEE Note  LVEF >60%  Bioprosthetic aortic valve gradients indicate severe stenosis.  No clear vegetation or thrombus.  Leaflets appear to be moving, though poorly-visualized.  Gradient is likely elevated due to high cardiac output, as the valve area is >1 by planimetry and does not appear, visually, to be severely stenotic.   Mitral valve ring well-seated.  No vegetation.  Gradient moderately elevated.    No evidence of endocarditis.  For additional details see full report.   [name] Haven C. Duke Salviaandolph, MD, [facility] CenterFACC  11/06/2015  4:35 PM

## 2015-11-06 NOTE — H&P (View-Only) (Signed)
Patient ID: [name] PearlSteven C Valentine, male   DOB: 11/07/1950, 65 y.o.   MRN: [id number]    Date:  11/02/2015   ID:  [name] PearlSteven C Gallacher, DOB 11/03/1950, MRN [id number]  PCP:  Louie BostonAPPER,DAVID B, MD  Primary Cardiologist:  Eden EmmsNishan  Chief Complaint  Patient presents with  . Shortness of Breath  . Dizziness     History of Present Illness: [name] [name] is a 65 y.o. male with a history of CAD, severe aortic insufficiency, status post aortic valve replacement using a 25 mm Edwards magna ease pericardial tissue valve, and mitral valve repair with complex valvuloplasty including autolgous or cardio patch repair of the posterior leaflet using a 28 mm Memo- 3-D ring annuloplasty. This was completed on October of 2012.  The patient was continued on Xarelto, with a history of multiple DVTs and IVC filter placed. He also has a history of varicosities of the lower extremity and was advised to see a vein specialists for evaluation and treatment.  His history also includes hypertension, obesity, diabetes mellitus, hyperlipidemia and obstructive sleep apnea, congestive heart failure.  His last echocardiogram was in March 2015 and revealed an ejection fraction of 60-65% with normal wall motion. Grade 2 diastolic dysfunction. Only functioning bioprosthetic aortic valve no significant MR, severe left atrial enlargement.  Patient presents with Fatigue, dyspnea, chills and nausea for the last 3 weeks. Patient states that pushing the trash can to the driveway the other day he had to stop and lean on it rest which is quite out of character for him. Has a 52 pound weight loss in the last three months.    The patient currently denies vomiting, fever, chest pain, orthopnea, dizziness, PND, cough, congestion, abdominal pain, hematochezia, melena, lower extremity edema, claudication.  Wt Readings from Last 3 Encounters:  11/02/15 264 lb (119.75 kg)  10/12/15 296 lb (134.265 kg)  08/10/15 316 lb (143.337 kg)     Past Medical History    Diagnosis Date  . Obesity   . Hypertension   . Depression   . 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 Diabetes mellitus   . Hyperlipidemia   . Coronary artery disease (CAD) excluded 2008, 2011    Mild coronary plaque.  w/ normal LV function. repeated cardiac cath on 07/2009  . 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 History of endocarditis     status post Enterococcus faecalis, secondary to chronic injections with low molecular weight heparin  . Deep venous thrombosis (HCC)     recurrent DVT and PE, status post IVC filter  . OSA (obstructive sleep apnea)     CPAP  . CHF (congestive heart failure) (HCC)   . Dyspnea     with exertion  . GERD (gastroesophageal reflux disease)   . Arthritis   . Restless leg syndrome     Current Outpatient Prescriptions  Medication Sig Dispense Refill  . acetaminophen (TYLENOL) 650 MG CR tablet Take 1,300 mg by mouth daily.    Marland Kitchen. aspirin (ECOTRIN) 325 MG EC tablet Take 325 mg by mouth daily.    . Azelastine-Fluticasone (DYMISTA) 137-50 MCG/ACT SUSP Place 2 sprays into both nostrils at bedtime. 1 Bottle 0  . Cholecalciferol (VITAMIN D3) 5000 units CAPS Take 1 capsule by mouth daily.    . clonazePAM (KLONOPIN) 0.5 MG tablet 1 or 2 tabs at bedtime for  sleep 50 tablet 3  . diazepam (VALIUM) 5 MG tablet Take 5 mg by mouth at bedtime.   0  . Exenatide ER (BYDUREON) 2 MG PEN Inject 2 mg into the skin every Sunday.     . Ferrous Sulfate-C-Folic Acid 105-500-0.8 MG TBCR Take 500 mg by mouth daily.    Marland Kitchen FLUoxetine (PROZAC) 40 MG capsule Take 40 mg by mouth daily.    . furosemide (LASIX) 40 MG tablet Take 40 mg by mouth Daily.     Marland Kitchen HYDROcodone-acetaminophen (NORCO/VICODIN) 5-325 MG tablet take 1 tablet by mouth every 6 hours if needed for SEVERE PAIN  0  . levothyroxine (SYNTHROID, LEVOTHROID) 200 MCG tablet  Take 200 mcg by mouth daily before breakfast.    . lisinopril (PRINIVIL,ZESTRIL) 10 MG tablet Take 10 mg by mouth daily.    . metFORMIN (GLUCOPHAGE) 500 MG tablet Take 500-1,000 mg by mouth 2 (two) times daily after a meal. 500 mg every morning and 1000 mg every evening    . metoprolol tartrate (LOPRESSOR) 25 MG tablet take 1 tablet by mouth once daily 90 tablet 3  . NON FORMULARY 14 setting    . omeprazole (PRILOSEC) 20 MG capsule Take 20 mg by mouth at bedtime.     . pramipexole (MIRAPEX) 0.25 MG tablet Take 2 tablets by mouth after dinner each night 60 tablet 3  . rivaroxaban (XARELTO) 20 MG TABS tablet Take 1 tablet (20 mg total) by mouth daily. 30 tablet 11  . simvastatin (ZOCOR) 40 MG tablet take 1 tablet by mouth at bedtime 90 tablet 1  . [DISCONTINUED] ONGLYZA 5 MG TABS tablet Take 5 mg by mouth daily.      No current facility-administered medications for this visit.    Allergies:    Allergies  Allergen Reactions  . Invokana [Canagliflozin] Rash    Social History:  The patient  reports that he quit smoking about 41 years ago. His smoking use included Cigarettes. He has a 6 pack-year smoking history. He has never used smokeless tobacco. He reports that he does not drink alcohol or use illicit drugs.   Family history:   Family History  Problem Relation Age of Onset  . Hypertension Father   . Hyperlipidemia Father   . CAD Father   . Diabetes Mother   . Hypertension Mother   . Cancer Mother   . CAD Mother   . Coronary artery disease Mother   . Stroke Mother   . Kidney disease Mother   . Hyperlipidemia Mother   . Diabetes Maternal Grandmother   . Diabetes Maternal Grandfather   . Diabetes Paternal Grandmother   . Diabetes Paternal Grandfather     ROS:  Please see the history of present illness.  All other systems reviewed and negative.   PHYSICAL EXAM: VS:  BP 110/76 mmHg  Pulse 76  Ht  (1.803 m)  Wt 264 lb (119.75 kg)  BMI 36.84 kg/m2  SpO2 97% Obese, well  developed, in no acute distress.  Appears fatigued and pale HEENT: Pupils are equal round react to light accommodation extraocular movements are intact.  Pale conjunctiva. Neck: no JVDNo cervical lymphadenopathy. Cardiac: Regular rate and rhythm 1/6 systolic murmur Lungs:  clear to auscultation bilaterally, no wheezing, rhonchi or rales Abd: soft, nontender, positive bowel sounds all quadrants, no hepatosplenomegaly Ext: no lower extremity edema.  2+ radial and dorsalis pedis pulses. Skin: warm and dry Neuro:  Grossly normal  EKG:  Normal sinus rhythm rate 76 bpm  ASSESSMENT AND PLAN:  Problem List Items Addressed This Visit    Unexplained weight loss   S/P mitral valve repair   S/P AVR (aortic valve replacement)   Fatigue - Primary   Relevant Orders   Blood culture (routine single)   EKG 12-Lead   Essential hypertension, benign   Chills   Relevant Orders   Blood culture (routine single)    Other Visit Diagnoses    Weakness        Relevant Orders    Blood culture (routine single)    SOB (shortness of breath)        Relevant Orders    Blood culture (routine single)    EKG 12-Lead       Patient is an obese 65 year old male with history of aortic valve replacement mitral valve repair, DVT, endocarditis. He was seen by primary care provider on Monday and his hemoglobin decreased to 8 from 8.7  three months ago. Has been on iron and has had some IV supplement. He's had endoscopy and colonoscopy which were normal.  Patient reports chills, nausea severe fatigue for the last 3 weeks. He's had no fever. Said 52 pounds weight loss in the last 3 months which was unintentional.  I strongly encouraged him to go to the emergency room for further evaluation and likely admission. It is possible that he has a recurrence of endocarditis.  Is an elevated sedimentation rate at 69 and BNP of 2900. However, patient does not appear to be in heart failure. He does have a new systolic murmur on exam  which was not there in January.    Patient has decided that he will proceed to the emergency room tomorrow morning. I recommend he go to [facility] however he'll probably go to [facility]. I have sent him to the lab on his way out to get blood cultures drawn.  His blood pressure is well-controlled with a heart rate of 76 bpm.  See Dr. Fabio Bering recommendations in his note.

## 2015-11-07 ENCOUNTER — Encounter (HOSPITAL_COMMUNITY): Payer: Self-pay | Admitting: Cardiovascular Disease

## 2015-11-07 ENCOUNTER — Inpatient Hospital Stay (HOSPITAL_COMMUNITY): Payer: Medicare Other

## 2015-11-07 DIAGNOSIS — K089 Disorder of teeth and supporting structures, unspecified: Secondary | ICD-10-CM | POA: Insufficient documentation

## 2015-11-07 DIAGNOSIS — B955 Unspecified streptococcus as the cause of diseases classified elsewhere: Secondary | ICD-10-CM

## 2015-11-07 DIAGNOSIS — I330 Acute and subacute infective endocarditis: Secondary | ICD-10-CM

## 2015-11-07 DIAGNOSIS — I38 Endocarditis, valve unspecified: Secondary | ICD-10-CM | POA: Insufficient documentation

## 2015-11-07 LAB — BASIC METABOLIC PANEL
Anion gap: 13 (ref 5–15)
BUN: 12 mg/dL (ref 6–20)
CHLORIDE: 100 mmol/L — AB (ref 101–111)
CO2: 25 mmol/L (ref 22–32)
Calcium: 8.7 mg/dL — ABNORMAL LOW (ref 8.9–10.3)
Creatinine, Ser: 1 mg/dL (ref 0.61–1.24)
GFR calc Af Amer: >60 mL/min (ref >60)
GFR calc non Af Amer: >60 mL/min (ref >60)
GLUCOSE: 120 mg/dL — AB (ref 65–99)
POTASSIUM: 3.8 mmol/L (ref 3.5–5.1)
SODIUM: 138 mmol/L (ref 135–145)

## 2015-11-07 LAB — GLUCOSE, CAPILLARY
Glucose-Capillary: 111 mg/dL — ABNORMAL HIGH (ref 65–99)
Glucose-Capillary: 101 mg/dL — ABNORMAL HIGH (ref 65–99)
Glucose-Capillary: 99 mg/dL (ref 65–99)
Glucose-Capillary: 105 mg/dL — ABNORMAL HIGH (ref 65–99)

## 2015-11-07 LAB — GENTAMICIN LEVEL, RANDOM: GENTAMICIN RM: 1.4 ug/mL

## 2015-11-07 MED ORDER — GENTAMICIN IN SALINE 1-0.9 MG/ML-% IV SOLN
100.0000 mg | Freq: Two times a day (BID) | INTRAVENOUS | Status: DC
  Administered 2015-11-07 – 2015-11-08 (×2): 100 mg via INTRAVENOUS
  Filled 2015-11-07 (×3): qty 100

## 2015-11-07 MED ORDER — DEXAMETHASONE SODIUM PHOSPHATE 10 MG/ML IJ SOLN: 10.0000 mg | Freq: Once | INTRAMUSCULAR | Status: DC

## 2015-11-07 MED ORDER — SODIUM CHLORIDE 0.9 % IV SOLN: INTRAVENOUS | Status: DC

## 2015-11-07 MED ORDER — CEFAZOLIN SODIUM-DEXTROSE 2-4 GM/100ML-% IV SOLN: 2.0000 g | INTRAVENOUS | Status: DC

## 2015-11-07 MED ORDER — ACETAMINOPHEN 10 MG/ML IV SOLN
1000.0000 mg | Freq: Once | INTRAVENOUS | Status: DC
  Filled 2015-11-07: qty 100

## 2015-11-07 MED ORDER — CHLORHEXIDINE GLUCONATE 4 % EX LIQD: 60.0000 mL | Freq: Once | CUTANEOUS | Status: DC

## 2015-11-07 NOTE — Progress Notes (Signed)
Patient Name: [name] [name] Date of Encounter: 11/07/2015    SUBJECTIVE: Denies any complaints. Reports that his presenting sx of SOB has improved since yesterday.   TELEMETRY:  NSR, HR 79 Filed Vitals:   11/06/15 1649 11/06/15 2023 11/06/15 2119 11/07/15 0554  BP: 108/68  105/65 118/86  Pulse: 84 82 74 75  Temp:   98.2 F (36.8 C) 97.8 F (36.6 C)  TempSrc:   Oral Oral  Resp: 20 18 18 18   Height:      Weight:    272 lb 11.3 oz (123.7 kg)  SpO2:  95% 96% 95%    Intake/Output Summary (Last 24 hours) at 11/07/15 0925 Last data filed at 11/06/15 1659  Gross per 24 hour  Intake  410.5 ml  Output    550 ml  Net -139.5 ml   LABS: Basic Metabolic Panel:  Recent Labs  11/06/15 0326 11/07/15 0349  NA 133* 138  K 3.5 3.8  CL 99* 100*  CO2 26 25  GLUCOSE 133* 120*  BUN 14 12  CREATININE 1.02 1.00  CALCIUM 8.4* 8.7*   CBC:  Recent Labs  11/05/15 0404  WBC 6.4  HGB 7.3*  HCT 24.5*  MCV 77.3*  PLT 187   Cardiac Enzymes:  Recent Labs  11/04/15 1100 11/04/15 1445  TROPONINI 0.28* 0.27*    Radiology/Studies:  TEE 5/15: negative for evidence of endocarditis. Aortic valve bioprosthesis was present and well-seated. Although the transvalvular velocity indicates severe aortic stenosis, this does not appear to be present visually. Valve appears to function normally.Gradient may be elevated due to anemia, sepsis or other causes of high output.   Physical Exam: Blood pressure 118/86, pulse 75, temperature 97.8 F (36.6 C), temperature source Oral, resp. rate 18, height 5' 11"  (1.803 m), weight 272 lb 11.3 oz (123.7 kg), SpO2 95 %. Weight change:   Wt Readings from Last 3 Encounters:  11/07/15 272 lb 11.3 oz (123.7 kg)  11/02/15 264 lb (119.75 kg)  10/12/15 296 lb (134.265 kg)    General: NAD, sitting in recliner at bedside.  Lungs: CTAB, no wheezing Cardiac: RRR, 2/6 LUSB systolic murmur GI: soft, active bowel sounds Neuro: CN II-XII grossly  intact Skin: warm and dry, pale   Assessment and plan:  1. GPC bactermia --2/2 blood cultures on 5/12 positive for microaerophilic streptococci. Concern for endocarditis due to a new murmur heard by his cardiologist in clinic as well as hx of endocarditis and bioprosthetic valve. TEE was done yesterday that was negative for vegetations or evidence of endocarditis. ECHO report did note an elevated transvalvular gradient indicating severe stenosis with a nl appearing bioprosthetic valve. Likely increased aortic gradient due to his underlying anemia which he has received an iron transfusion for in the past w/ neg EGD and colonoscopy workup.  2. Microcyctic anemia-- hgb yesterday 7.3, MCV 77.3. Neg EGD and colonoscopy per chart review. Consider checking ferritin level and retic count; possibly starting on iron supplements.   SignedJulious Oka 11/07/2015, 9:25 AM  I have seen and examined the patient along with Julious Oka, MD.  I have reviewed the chart, notes and new data.  I agree with her note.  Key new complaints: improved dyspnea Key examination changes: loud murmur in setting of anemia and high cardiac output, no diastolic murmur Key new findings / data: GRANULICATELLA ADIACENS in blood cultures, no vegetations or periannular abscess on TEE. This is different from the enterococcus that caused his initial  endocarditis.  PLAN: Protracted illness with anorexia, marked weight loss and fatigue is compatible with subacute bacterial endocarditis and the organism grown has been incriminated as a cause of endocarditis. However, TEE shows no evidence of such, WBC is normal, anemia has alternative explanation. Hard to interpret ESR with this degree of anemia, but CRP is also very high. Symptoms have been present for at least 3 weeks and weight loss began 3 months ago. Symptom onset was around the time of colonoscopy and hip surgery in February. After 3 months, one would expect there would be more  evidence of tissue destruction by now, even with a slow fastidious organism such as nutritionally variant strep. The anemia itself can explain a large part of his complaints. All in all, it appears justified to treat as endocarditis, but there is no anticipated need for surgery at this point. Recheck iron studies. His iron deficiency anemia did not respond to oral supplements and he needed IV iron in the recent past.  Sanda Klein, MD, [place] [phone] 11/07/2015, 1:23 PM

## 2015-11-07 NOTE — Progress Notes (Signed)
Pharmacy Antibiotic Note  [name] [name] is a 65 y.o. male admitted on 11/03/2015 with bacteremia and suspected endocarditis.  Pharmacy has been consulted for gentamicin dosing.  -gentamicin trough < 0.5 (down from 1.4)  Goals Gentamicin pk: 3-4 Gentamicin trough: < 1   Plan: -Begin gentamicin 100mg  IV q12h  -Will follow renal function, cultures and clinical progress -Gentamicin level at steady state   Height: 5\' 11"  (180.3 cm) Weight: 272 lb 11.3 oz (123.7 kg) IBW/kg (Calculated) : 75.3  Temp (24hrs), Avg:97.8 F (36.6 C), Min:97.7 F (36.5 C), Max:97.8 F (36.6 C)   Recent Labs Lab 11/03/15 1722 11/04/15 0436 11/05/15 0404 11/06/15 0326 11/06/15 1317 11/07/15 0349 11/07/15 1604  WBC 7.4 7.9 6.4  --   --   --   --   CREATININE 0.83 0.89  --  1.02  --  1.00  --   GENTTROUGH  --   --   --   --  1.1  --   --   GENTRANDOM  --   --   --   --   --  1.4 <0.5    Estimated Creatinine Clearance: 98.6 mL/min (by C-G formula based on Cr of 1).    Allergies  Allergen Reactions  . Invokana [Canagliflozin] Rash    Antimicrobials this admission: Vancomycin 5/12 >> 5/14 Gentamicin 5/14>> Ampicillin 5/14>>  Microbiology results: Outpatient Blood cx: Granulicatella adaicens(unable to see in EPIC) 5/12 Blood cx x4: 4/4 GPC  Thank you for allowing pharmacy to be a part of this patient's care.  Harland GermanAndrew Harper Vand[place]oort, Pharm D 11/07/2015 9:29 PM

## 2015-11-07 NOTE — Progress Notes (Signed)
PROGRESS NOTE                                                                                                                                                                                                             Patient Demographics:    [name] [name], is a 65 y.o. male, DOB - [birth year], ZOX:[id number]  Admit date - 11/03/2015   Admitting Physician Levie Heritage, DO  Outpatient Primary MD for the patient is TAPPER,DAVID B, MD  LOS - 4  Chief Complaint  Patient presents with  . Abnormal Lab       Brief Narrative  65 y.o. 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 The patient has had chills and fatigue for over 3 weeks, with shortness of breath that worsens with activity and improves with rest   Subjective:    [name] [name] He reports no new complaints, no acute issues overnight per my discussion with him.   Assessment  & Plan :   1 positive blood culture  Cardiology on board  Continue antibiotic regimen as per ID recs. If blood cultures remain negative tomorrow 11/08/15 plan is to place picc line.  TEE completed and shows no evidence of endocarditis.   #2 elevated troponin  Cards consulted and no further intervention or work up planned.  #3 history of endocarditis - Please see TEE discussion above.  #4 status post aortic valve replacement #5 status post mitral valve replacement #6 anemia  Hemoccult testing ordered   Code Status : full  Family Communication  : None at bedside  Disposition Plan  : pending improvement in condition  Consults  :  Cards, ID  Procedures  :  None  DVT Prophylaxis  : on xarelto  Lab Results  Component Value Date   PLT 187 11/05/2015    Antibiotics  :  Gentamicin, ampicillin  Anti-infectives    Start     Dose/Rate Route Frequency Ordered Stop   11/07/15 0845  ceFAZolin (ANCEF) IVPB 2g/100 mL premix  Status:  Discontinued     2 g 200 mL/hr over  30 Minutes Intravenous On call to O.R. 11/07/15 0830 11/07/15 0832   11/07/15 0200  gentamicin (GARAMYCIN) IVPB 80 mg  Status:  Discontinued     80 mg 100 mL/hr over 30 Minutes Intravenous Every 8 hours 11/06/15 1818 11/06/15 2031   11/05/15 2200  gentamicin (GARAMYCIN) 210 mg in dextrose 5 % 50 mL IVPB  Status:  Discontinued     210 mg 110.5 mL/hr over 30 Minutes Intravenous Every 8 hours 11/05/15 1356 11/06/15 1748   11/05/15 1500  ampicillin (OMNIPEN) 2 g in sodium chloride 0.9 % 50 mL IVPB     2 g 150 mL/hr over 20 Minutes Intravenous Every 4 hours 11/05/15 1336     11/05/15 1400  gentamicin (GARAMYCIN) 230 mg in dextrose 5 % 50 mL IVPB     2.5 mg/kg  93.1 kg (Adjusted) 111.5 mL/hr over 30 Minutes Intravenous  Once 11/05/15 1356 11/05/15 1541   11/04/15 0800  vancomycin (VANCOCIN) IVPB 1000 mg/200 mL premix  Status:  Discontinued     1,000 mg 200 mL/hr over 60 Minutes Intravenous Every 12 hours 11/03/15 2000 11/05/15 1336   11/03/15 2100  vancomycin (VANCOCIN) IVPB 1000 mg/200 mL premix     1,000 mg 200 mL/hr over 60 Minutes Intravenous  Once 11/03/15 1949 11/03/15 2140   11/03/15 2000  vancomycin (VANCOCIN) IVPB 1000 mg/200 mL premix     1,000 mg 200 mL/hr over 60 Minutes Intravenous  Once 11/03/15 1949 11/03/15 2300        Objective:   Filed Vitals:   11/06/15 2023 11/06/15 2119 11/07/15 0554 11/07/15 1322  BP:  105/65 118/86 112/76  Pulse: 82 74 75 88  Temp:  98.2 F (36.8 C) 97.8 F (36.6 C) 97.7 F (36.5 C)  TempSrc:  Oral Oral Oral  Resp: 18 18 18 18   Height:      Weight:   123.7 kg (272 lb 11.3 oz)   SpO2: 95% 96% 95% 98%    Wt Readings from Last 3  Encounters:  11/07/15 123.7 kg (272 lb 11.3 oz)  11/02/15 119.75 kg (264 lb)  10/12/15 134.265 kg (296 lb)     Intake/Output Summary (Last 24 hours) at 11/07/15 1647 Last data filed at 11/07/15 1300  Gross per 24 hour  Intake    770 ml  Output   1500 ml  Net   -730 ml     Physical Exam  Awake Alert, Oriented X 3,  Supple Neck,No JVD, No cervical lymphadenopathy appreciated.  Symmetrical Chest wall movement, Good air movement bilaterally, CTAB RRR,No Gallops, no cyanosis +ve B.Sounds, Abd Soft, No tenderness, No organomegaly appriciated, No rebound - guarding or rigidity. No Cyanosis    Data Review:    CBC  Recent Labs Lab 11/03/15 1722 11/04/15 0436 11/05/15 0404  WBC 7.4 7.9 6.4  HGB 8.4* 7.7* 7.3*  HCT 28.3* 26.6* 24.5*  PLT 180 193 187  MCV 77.5* 76.4* 77.3*  MCH 23.0* 22.1* 23.0*  MCHC 29.7* 28.9* 29.8*  RDW 22.0* 22.0* 22.0*  LYMPHSABS 0.8  --   --   MONOABS 0.3  --   --   EOSABS 0.0  --   --   BASOSABS 0.0  --   --     Chemistries   Recent Labs Lab 11/03/15 1722 11/04/15 0436 11/06/15 0326 11/07/15 0349  NA 130* 134* 133* 138  K 3.5 3.6 3.5 3.8  CL 100* 99* 99* 100*  CO2 23 23 26 25   GLUCOSE 156* 156* 133*  120*  BUN 12 10 14 12   CREATININE 0.83 0.89 1.02 1.00  CALCIUM 8.4* 8.5* 8.4* 8.7*  AST 29  --   --   --   ALT 19  --   --   --   ALKPHOS 119  --   --   --   BILITOT 0.9  --   --   --    ------------------------------------------------------------------------------------------------------------------ No results for input(s): CHOL, HDL, LDLCALC, TRIG, CHOLHDL, LDLDIRECT in the last 72 hours.  Lab Results  Component Value Date   HGBA1C 7.3* 07/28/2015   ------------------------------------------------------------------------------------------------------------------ No results for input(s): TSH, T4TOTAL, T3FREE, THYROIDAB in the last 72 hours.  Invalid input(s):  FREET3 ------------------------------------------------------------------------------------------------------------------ No results for input(s): VITAMINB12, FOLATE, FERRITIN, TIBC, IRON, RETICCTPCT in the last 72 hours.  Coagulation profile  Recent Labs Lab 11/03/15 1722  INR 1.21    No results for input(s): DDIMER in the last 72 hours.  Cardiac Enzymes  Recent Labs Lab 11/04/15 0907 11/04/15 1100 11/04/15 1445  TROPONINI 0.26* 0.28* 0.27*   ------------------------------------------------------------------------------------------------------------------    Component Value Date/Time   BNP 398.0* 11/03/2015 1722    Inpatient Medications  Scheduled Meds: . ampicillin (OMNIPEN) IV  2 g Intravenous Q4H  . aspirin  325 mg Oral Daily  . diazepam  5 mg Oral QHS  . FLUoxetine  40 mg Oral Daily  . furosemide  40 mg Oral Daily  . gabapentin  300 mg Oral QHS  . insulin aspart  0-15 Units Subcutaneous TID WC  . levothyroxine  112 mcg Oral QAC breakfast  . lisinopril  10 mg Oral Daily  . metFORMIN  500 mg Oral BID WC  . metoprolol tartrate  25 mg Oral QPM  . multivitamins with iron  1 tablet Oral Daily  . pantoprazole  40 mg Oral Daily  . pramipexole  0.25 mg Oral QHS  . rivaroxaban  20 mg Oral Q supper  . simvastatin  40 mg Oral QHS  . sodium chloride flush  3 mL Intravenous Q12H   Continuous Infusions:  PRN Meds:.acetaminophen **OR** acetaminophen, HYDROcodone-acetaminophen, ondansetron **OR** ondansetron (ZOFRAN) IV, polyethylene glycol  Micro Results Recent Results (from the past 240 hour(s))  Blood culture (routine single)     Status: None   Collection Time: 11/02/15  1:33 PM  Result Value Ref Range Status   Organism ID, Bacteria NUTRITIONALLY DEFICIENT STREPTOCOCCUS  Final    Comment: IDENTIFIED AS GRANULICATELLA ADIACENS Standardized susceptibility testing for this organism is not available. Gram Stain Report Called to,Read Back By and Verified  With: CHRISTY Y 11/03/15 1330 BY SMITHERSJ   Culture, blood (routine x 2)     Status: Abnormal   Collection Time: 11/03/15  5:22 PM  Result Value Ref Range Status   Specimen Description LEFT ANTECUBITAL  Final   Special Requests BOTTLES DRAWN AEROBIC AND ANAEROBIC 8CC EACH  Final   Culture  Setup Time   Final    GRAM POSITIVE COCCI IN CHAINS Gram Stain Report Called to,Read Back By and Verified With: CLOER K. MC AT 1304 ON [id number] BY THOMPSON S.    Culture (A)  Final    MICROAEROPHILIC STREPTOCOCCI Standardized susceptibility testing for this organism is not available. Performed at [facility]    Report Status 11/06/2015 FINAL  Final  Culture, blood (routine x 2)     Status: Abnormal   Collection Time: 11/03/15  5:27 PM  Result Value Ref Range Status   Specimen Description BLOOD LEFT HAND  Final  Special Requests BOTTLES DRAWN AEROBIC AND ANAEROBIC [facility]6CC EACH  Final   Culture  Setup Time   Final    GRAM POSITIVE COCCI IN CHAINS Gram Stain Report Called to,Read Back By and Verified With: CLOER K. MC AT 1304 ON [id number] BY THOMPSON S.    Culture (A)  Final    MICROAEROPHILIC STREPTOCOCCI Standardized susceptibility testing for this organism is not available. Performed at [facility]    Report Status 11/06/2015 FINAL  Final  Blood culture (routine x 2)     Status: Abnormal   Collection Time: 11/03/15  7:47 PM  Result Value Ref Range Status   Specimen Description BLOOD RIGHT ARM  Final   Special Requests BOTTLES DRAWN AEROBIC AND ANAEROBIC [facility]6CC EACH  Final   Culture  Setup Time   Final    GRAM POSITIVE COCCI IN CHAINS Gram Stain Report Called to,Read Back By and Verified With: CLOER K. MC AT 1304 ON [id number] BY THOMPSON S.    Culture (A)  Final    MICROAEROPHILIC STREPTOCOCCI Standardized susceptibility testing for this organism is not available. Performed at [facility]    Report Status 11/06/2015 FINAL  Final  Blood culture (routine x 2)     Status:  Abnormal   Collection Time: 11/03/15  7:55 PM  Result Value Ref Range Status   Specimen Description BLOOD LEFT ARM  Final   Special Requests BOTTLES DRAWN AEROBIC AND ANAEROBIC [facility]6CC EACH  Final   Culture  Setup Time   Final    GRAM POSITIVE COCCI IN CHAINS Gram Stain Report Called to,Read Back By and Verified With: CLOER K. MC AT 1304 ON [id number] BY THOMPSON S.    Culture (A)  Final    MICROAEROPHILIC STREPTOCOCCI Standardized susceptibility testing for this organism is not available. Performed at [facility]    Report Status 11/06/2015 FINAL  Final  Surgical pcr screen     Status: None   Collection Time: 11/04/15  9:44 PM  Result Value Ref Range Status   MRSA, PCR NEGATIVE NEGATIVE Final   Staphylococcus aureus NEGATIVE NEGATIVE Final    Comment:        The Xpert SA Assay (FDA approved for NASAL specimens in patients over 66 years of age), is one component of a comprehensive surveillance program.  Test performance has been validated by [facility] for patients greater than or equal to 66 year old. It is not intended to diagnose infection nor to guide or monitor treatment.   Culture, blood (routine x 2)     Status: None (Preliminary result)   Collection Time: 11/05/15  2:30 PM  Result Value Ref Range Status   Specimen Description BLOOD RIGHT ANTECUBITAL  Final   Special Requests BOTTLES DRAWN AEROBIC AND ANAEROBIC 5CC  Final   Culture NO GROWTH 2 DAYS  Final   Report Status PENDING  Incomplete  Culture, blood (routine x 2)     Status: None (Preliminary result)   Collection Time: 11/05/15  2:45 PM  Result Value Ref Range Status   Specimen Description BLOOD RIGHT ANTECUBITAL  Final   Special Requests BOTTLES DRAWN AEROBIC AND ANAEROBIC 5CC  Final   Culture NO GROWTH 2 DAYS  Final   Report Status PENDING  Incomplete    Radiology Reports Dg Chest 2 View  11/03/2015  CLINICAL DATA:  Fatigue, chills and shortness of breath 3 weeks. EXAM: CHEST  2 VIEW COMPARISON:   05/06/2011 FINDINGS: The sternotomy wires unchanged. Lungs are adequately  inflated with without consolidation or effusion. Stable cardiomegaly. Prosthetic mitral valve unchanged. Remainder exam is unchanged. The IMPRESSION: No active cardiopulmonary disease. Electronically Signed   By: Elberta Fortis M.D.   On: 11/03/2015 17:12   Mr Cervical Spine Wo Contrast  10/27/2015  CLINICAL DATA:  Neck pain radiating to the right arm for 1 month. EXAM: MRI CERVICAL SPINE WITHOUT CONTRAST TECHNIQUE: Multiplanar, multisequence MR imaging of the cervical spine was performed. No intravenous contrast was administered. COMPARISON:  04/19/2011 FINDINGS: The study is motion degraded despite repeating some sequences. Axial series are moderately motion degraded, particularly limiting assessment for neural foraminal stenosis. Vertebral alignment is normal. Vertebral bone marrow signal is moderately diminished diffusely, nonspecific but most likely reflective of patient's underlying anemia. No significant vertebral marrow edema is seen. Vertebral body heights are preserved. Patchy T2 hyperintensities are present in the pons, nonspecific but likely reflective of chronic small vessel ischemia in this patient with hypertension and diabetes. The cervical spinal cord is normal in caliber without definite signal abnormality identified within limitations of motion artifact. Paraspinal soft tissues are unremarkable. C2-3:  No disc herniation or stenosis. C3-4: Mild disc bulging and minimal facet arthrosis without spinal stenosis or evidence of significant neural foraminal stenosis allowing for motion artifact. C4-5: Mild disc bulging mild disc bulging without spinal canal or definite neural foraminal stenosis. C5-6:  Mild disc bulging without stenosis. C6-7: Circumferential disc osteophyte complex greater to the left results in mild right and moderate left neural foraminal stenosis, grossly similar to the prior study. No spinal stenosis. C7-T1:  Mild disc bulging and mild facet arthrosis result in mild left neural foraminal stenosis, similar to the prior study. No spinal stenosis. IMPRESSION: 1. Motion degraded examination without definite right-sided neural impingement identified. 2. Mild right and moderate left neural foraminal stenosis at C6-7, similar to the prior study. 3. Diffusely diminished bone marrow signal intensity compatible with known underlying anemia. Electronically Signed   By: Sebastian Ache M.D.   On: 10/27/2015 08:25    Time Spent in minutes  25   Penny Pia M.D on 11/07/2015 at 4:47 PM  Between 7am to 7pm - Pager - [phone]  After 7pm go to [email or web address] - password [facility]  [facility] -  Office  [phone]

## 2015-11-07 NOTE — Progress Notes (Addendum)
INFECTIOUS DISEASE PROGRESS NOTE  ID: [name] [name] is a 65 y.o. male with  Principal Problem:   Streptococcal bacteremia Active Problems:   Obstructive sleep apnea   S/P AVR (aortic valve replacement)   S/P mitral valve repair   History of endocarditis   Type 2 diabetes mellitus with vascular disease (HCC)   Fatigue   Chills   Elevated troponin I level   Anemia   Unintentional weight loss  Subjective:  Remains afebrile, TEE did not show evidence of vegetation though bioprosthetic aortic valve shows severe stenosis/ MV ring well seated no vegetation either  Abtx:  Anti-infectives    Start     Dose/Rate Route Frequency Ordered Stop   11/07/15 0845  ceFAZolin (ANCEF) IVPB 2g/100 mL premix  Status:  Discontinued     2 g 200 mL/hr over 30 Minutes Intravenous On call to O.R. 11/07/15 0830 11/07/15 0832   11/07/15 0200  gentamicin (GARAMYCIN) IVPB 80 mg  Status:  Discontinued     80 mg 100 mL/hr over 30 Minutes Intravenous Every 8 hours 11/06/15 1818 11/06/15 2031   11/05/15 2200  gentamicin (GARAMYCIN) 210 mg in dextrose 5 % 50 mL IVPB  Status:  Discontinued     210 mg 110.5 mL/hr over 30 Minutes Intravenous Every 8 hours 11/05/15 1356 11/06/15 1748   11/05/15 1500  ampicillin (OMNIPEN) 2 g in sodium chloride 0.9 % 50 mL IVPB     2 g 150 mL/hr over 20 Minutes Intravenous Every 4 hours 11/05/15 1336     11/05/15 1400  gentamicin (GARAMYCIN) 230 mg in dextrose 5 % 50 mL IVPB     2.5 mg/kg  93.1 kg (Adjusted) 111.5 mL/hr over 30 Minutes Intravenous  Once 11/05/15 1356 11/05/15 1541   11/04/15 0800  vancomycin (VANCOCIN) IVPB 1000 mg/200 mL premix  Status:  Discontinued     1,000 mg 200 mL/hr over 60 Minutes Intravenous Every 12 hours 11/03/15 2000 11/05/15 1336   11/03/15 2100  vancomycin (VANCOCIN) IVPB 1000 mg/200 mL premix     1,000 mg 200 mL/hr over 60 Minutes Intravenous  Once 11/03/15 1949 11/03/15 2140   11/03/15 2000  vancomycin (VANCOCIN) IVPB 1000 mg/200 mL premix      1,000 mg 200 mL/hr over 60 Minutes Intravenous  Once 11/03/15 1949 11/03/15 2300      Medications:  Scheduled: . ampicillin (OMNIPEN) IV  2 g Intravenous Q4H  . aspirin  325 mg Oral Daily  . diazepam  5 mg Oral QHS  . FLUoxetine  40 mg Oral Daily  . furosemide  40 mg Oral Daily  . gabapentin  300 mg Oral QHS  . insulin aspart  0-15 Units Subcutaneous TID WC  . levothyroxine  112 mcg Oral QAC breakfast  . lisinopril  10 mg Oral Daily  . metFORMIN  500 mg Oral BID WC  . metoprolol tartrate  25 mg Oral QPM  . multivitamins with iron  1 tablet Oral Daily  . pantoprazole  40 mg Oral Daily  . pramipexole  0.25 mg Oral QHS  . rivaroxaban  20 mg Oral Q supper  . simvastatin  40 mg Oral QHS  . sodium chloride flush  3 mL Intravenous Q12H    Objective: Vital signs in last 24 hours: Temp:  [97.7 F (36.5 C)-98.2 F (36.8 C)] 97.7 F (36.5 C) (05/16 1322) Pulse Rate:  [74-88] 88 (05/16 1322) Resp:  [13-21] 18 (05/16 1322) BP: (104-134)/(65-87) 112/76 mmHg (05/16 1322) SpO2:  [92 %-  100 %] 98 % (05/16 1322) Weight:  [272 lb 11.3 oz (123.7 kg)] 272 lb 11.3 oz (123.7 kg) (05/16 0554)  Physical Exam  Constitutional: He is oriented to person, place, and time. He appears well-developed and well-nourished. No distress.  HENT: poor dentition, darkened eroding front teeth Mouth/Throat: Oropharynx is clear and moist. No oropharyngeal exudate.  Cardiovascular: Normal rate, regular rhythm and 2/6 LUSB murmur Pulmonary/Chest: Effort normal and breath sounds normal. No respiratory distress. He has no wheezes.  Abdominal: Soft. Bowel sounds are normal. He exhibits no distension. There is no tenderness.  Lymphadenopathy:  He has no cervical adenopathy.  Neurological: He is alert and oriented to person, place, and time.  Skin: Skin is warm and dry. No rash noted. No erythema.  Psychiatric: He has a normal mood and affect. His behavior is normal.     Lab Results  Recent Labs   11/05/15 0404 11/06/15 0326 11/07/15 0349  WBC 6.4  --   --   HGB 7.3*  --   --   HCT 24.5*  --   --   NA  --  133* 138  K  --  3.5 3.8  CL  --  99* 100*  CO2  --  26 25  BUN  --  14 12  CREATININE  --  1.02 1.00    Microbiology: Recent Results (from the past 240 hour(s))  Blood culture (routine single)     Status: None   Collection Time: 11/02/15  1:33 PM  Result Value Ref Range Status   Organism ID, Bacteria NUTRITIONALLY DEFICIENT STREPTOCOCCUS  Final    Comment: IDENTIFIED AS GRANULICATELLA ADIACENS Standardized susceptibility testing for this organism is not available. Gram Stain Report Called to,Read Back By and Verified With: CHRISTY Y 11/03/15 1330 BY SMITHERSJ   Culture, blood (routine x 2)     Status: Abnormal   Collection Time: 11/03/15  5:22 PM  Result Value Ref Range Status   Specimen Description LEFT ANTECUBITAL  Final   Special Requests BOTTLES DRAWN AEROBIC AND ANAEROBIC 8CC EACH  Final   Culture  Setup Time   Final    GRAM POSITIVE COCCI IN CHAINS Gram Stain Report Called to,Read Back By and Verified With: CLOER K. MC AT 1304 ON [id number] BY THOMPSON S.    Culture (A)  Final    MICROAEROPHILIC STREPTOCOCCI Standardized susceptibility testing for this organism is not available. Performed at [facility]    Report Status 11/06/2015 FINAL  Final  Culture, blood (routine x 2)     Status: Abnormal   Collection Time: 11/03/15  5:27 PM  Result Value Ref Range Status   Specimen Description BLOOD LEFT HAND  Final   Special Requests BOTTLES DRAWN AEROBIC AND ANAEROBIC [facility] EACH  Final   Culture  Setup Time   Final    GRAM POSITIVE COCCI IN CHAINS Gram Stain Report Called to,Read Back By and Verified With: CLOER K. MC AT 1304 ON [id number] BY THOMPSON S.    Culture (A)  Final    MICROAEROPHILIC STREPTOCOCCI Standardized susceptibility testing for this organism is not available. Performed at [facility]    Report Status 11/06/2015 FINAL  Final   Blood culture (routine x 2)     Status: Abnormal   Collection Time: 11/03/15  7:47 PM  Result Value Ref Range Status   Specimen Description BLOOD RIGHT ARM  Final   Special Requests BOTTLES DRAWN AEROBIC AND ANAEROBIC [facility] EACH  Final   Culture  Setup Time   Final    GRAM POSITIVE COCCI IN CHAINS Gram Stain Report Called to,Read Back By and Verified With: CLOER K. MC AT 1304 ON [id number] BY THOMPSON S.    Culture (A)  Final    MICROAEROPHILIC STREPTOCOCCI Standardized susceptibility testing for this organism is not available. Performed at [facility]    Report Status 11/06/2015 FINAL  Final  Blood culture (routine x 2)     Status: Abnormal   Collection Time: 11/03/15  7:55 PM  Result Value Ref Range Status   Specimen Description BLOOD LEFT ARM  Final   Special Requests BOTTLES DRAWN AEROBIC AND ANAEROBIC [facility] EACH  Final   Culture  Setup Time   Final    GRAM POSITIVE COCCI IN CHAINS Gram Stain Report Called to,Read Back By and Verified With: CLOER K. MC AT 1304 ON [id number] BY THOMPSON S.    Culture (A)  Final    MICROAEROPHILIC STREPTOCOCCI Standardized susceptibility testing for this organism is not available. Performed at [facility]    Report Status 11/06/2015 FINAL  Final  Surgical pcr screen     Status: None   Collection Time: 11/04/15  9:44 PM  Result Value Ref Range Status   MRSA, PCR NEGATIVE NEGATIVE Final   Staphylococcus aureus NEGATIVE NEGATIVE Final    Comment:        The Xpert SA Assay (FDA approved for NASAL specimens in patients over 13 years of age), is one component of a comprehensive surveillance program.  Test performance has been validated by [facility] for patients greater than or equal to 104 year old. It is not intended to diagnose infection nor to guide or monitor treatment.     Studies/Results: No results found.   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 Due to concern for nephrotoxicity, we will keep him on gentamicin while hospitalized, but can discharge him on ceftriaxone 2gm IV daily  Source of NVS is usually oral-GI-GU source. He reports having colonoscopy in February which was normal. He has poor dentition with recent teeth chipping off front tooth. Will check panorex, please have dental  evaluate for any dental extractions  Defer to cardiology if needs to be evaluated for further valve surgery, though per Dr. Royann Shivers, it appears hemodynamic changes heard on exam initially thought to be related to anemia  If blood cultures remain negative tomorrow,@ 72hr from last blood cx, can have picc line placed          Brandelyn Henne Infectious Diseases (pager) [phone] [email or web address] 11/07/2015, 1:32 PM  LOS: 4 days

## 2015-11-08 DIAGNOSIS — I38 Endocarditis, valve unspecified: Secondary | ICD-10-CM

## 2015-11-08 LAB — CBC
HCT: 30.8 % — ABNORMAL LOW (ref 39.0–52.0)
Hemoglobin: 8.8 g/dL — ABNORMAL LOW (ref 13.0–17.0)
MCH: 22.2 pg — ABNORMAL LOW (ref 26.0–34.0)
MCHC: 28.6 g/dL — AB (ref 30.0–36.0)
MCV: 77.8 fL — ABNORMAL LOW (ref 78.0–100.0)
PLATELETS: 272 10*3/uL (ref 150–400)
RBC: 3.96 MIL/uL — ABNORMAL LOW (ref 4.22–5.81)
RDW: 21.5 % — AB (ref 11.5–15.5)
WBC: 5 10*3/uL (ref 4.0–10.5)

## 2015-11-08 LAB — BASIC METABOLIC PANEL
Anion gap: 11 (ref 5–15)
BUN: 13 mg/dL (ref 6–20)
CALCIUM: 8.6 mg/dL — AB (ref 8.9–10.3)
CO2: 26 mmol/L (ref 22–32)
CREATININE: 1.09 mg/dL (ref 0.61–1.24)
Chloride: 99 mmol/L — ABNORMAL LOW (ref 101–111)
GFR calc non Af Amer: >60 mL/min (ref >60)
GLUCOSE: 115 mg/dL — AB (ref 65–99)
Potassium: 3.6 mmol/L (ref 3.5–5.1)
Sodium: 136 mmol/L (ref 135–145)

## 2015-11-08 LAB — FERRITIN: Ferritin: 255 ng/mL (ref 24–336)

## 2015-11-08 LAB — IRON AND TIBC
IRON: 32 ug/dL — AB (ref 45–182)
SATURATION RATIOS: 9 % — AB (ref 17.9–39.5)
TIBC: 337 ug/dL (ref 250–450)
UIBC: 305 ug/dL

## 2015-11-08 LAB — GLUCOSE, CAPILLARY
GLUCOSE-CAPILLARY: 106 mg/dL — AB (ref 65–99)
GLUCOSE-CAPILLARY: 103 mg/dL — AB (ref 65–99)
Glucose-Capillary: 152 mg/dL — ABNORMAL HIGH (ref 65–99)

## 2015-11-08 MED ORDER — CEFTRIAXONE SODIUM 2 G IV SOLR: 2.0000 g | INTRAVENOUS | Status: DC

## 2015-11-08 MED ORDER — HEPARIN SOD (PORK) LOCK FLUSH 100 UNIT/ML IV SOLN
250.0000 [IU] | INTRAVENOUS | Status: AC | PRN
  Administered 2015-11-08: 250 [IU]

## 2015-11-08 MED ORDER — SODIUM CHLORIDE 0.9% FLUSH: 10.0000 mL | INTRAVENOUS | Status: DC | PRN

## 2015-11-08 NOTE — Discharge Summary (Signed)
[facility]  Physician Discharge Summary   Patient ID: [name] [name] MRN: [id number] DOB/AGE: 66-Apr-1952 66 y.o.  Admit date: 11/03/2015 Discharge date: 11/08/2015  PCP: Louie Boston, MD  DISCHARGE DIAGNOSES:  Principal Problem:   Streptococcal bacteremia Active Problems:   Obstructive sleep apnea   S/P AVR (aortic valve replacement)   S/P mitral valve repair   History of endocarditis   Type 2 diabetes mellitus with vascular disease (HCC)   Fatigue   Chills   Elevated troponin I level   Anemia   Unintentional weight loss   Endocarditis   Poor dentition   RECOMMENDATIONS FOR OUTPATIENT FOLLOW UP: 1. Patient to follow-up with his dentist as to manage possible dental infection 2. Home health has been ordered for IV antibiotics   DISCHARGE CONDITION: fair  Diet recommendation: As before  Filed Weights   11/03/15 1638 11/07/15 0554 11/08/15 0640  Weight: 119.75 kg (264 lb) 123.7 kg (272 lb 11.3 oz) 124.9 kg (275 lb 5.7 oz)    INITIAL HISTORY: 65 y.o. 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 The patient has had chills and fatigue for over 3 weeks, with shortness of breath that worsens with activity and improves with rest.  Consultations:  Infectious disease  Cardiology  Procedures: TEE Study Conclusions - Left ventricle: Systolic function was normal. The estimated ejection fraction was in the range of 55% to 60%. Wall motion was normal; there were no regional wall motion abnormalities. - Aortic valve: A bioprosthesis was present and well-seated. Valve area by planimetry was 1.59 cm^2. Although the transvalvular velocity  indicates severe aortic stenosis, this does not appear to be present visually. Valve appears to function normally. Gradient may be elevated due to anemia, sepsis or other causes of high output. Peak velocity (S): 456 cm/s. Mean gradient (S): 52 mm Hg. Valve area (VTI): 1.24 cm^2. Valve area (Vmax): 1.34 cm^2. Valve area (Vmean): 1.44 cm^2. - Mitral valve: Prior procedures included surgical repair. An annular ring prosthesis was present. There was mild regurgitation. Valve area by continuity equation (using LVOT flow): 2.1 cm^2. - Left atrium: No evidence of thrombus in the atrial cavity or appendage. No evidence of thrombus in the atrial cavity or appendage. - Right atrium: No evidence of thrombus in the atrial cavity or appendage. - Atrial septum: No defect or patent foramen ovale was identified by color flow Doppler. Impressions: - No evidence of endocarditis.  PICC line placement   HOSPITAL COURSE:   Bacteremia  Patient's blood cultures were positive for bacteremia, with nutritional variant streptococcal (NVS) species - granulicatella adiacens, often associated with poor dentition. Patient has since seen by infectious disease. Patient underwent TEE which did not show any endocarditis. ID feels that patient will benefit by being treated for 6 weeks with IV antibiotics. PICC line has been placed after negative cultures. Ceftriaxone will be ordered for home use. Home health has been ordered. Patient underwent orthopantagram, which showed dental caries. Patient is followed by a dentist in [facility]. He will follow-up for this issue with his dentist.   Elevated troponin Cardiology was consulted for the same. They do not plan any further intervention. Outpatient follow-up.  Patient is status post aortic and mitral valve replacement with bioprosthetic  valves Patient is on oral anticoagulation, which will be continued. Patient seen by cardiology. No endocarditis noted on the TEE. No  indication for surgical intervention.  Microcytic anemia Patient has received iron for the same. His hemoglobin this morning is 8.8. He has undergone studies as an outpatient including EGD and colonoscopy which did not reveal any reason for his anemia. Ferritin level was greater than 200. He has been told that he should discuss this further with his PCP and that he will benefit from a referral to hematology.  Overall, stable and improved. PICC line has been placed. Okay for discharge home today.  PERTINENT LABS:  The results of significant diagnostics from this hospitalization (including imaging, microbiology, ancillary and laboratory) are listed below for reference.    Microbiology: Recent Results (from the past 240 hour(s))  Blood culture (routine single)     Status: None   Collection Time: 11/02/15  1:33 PM  Result Value Ref Range Status   Organism ID, Bacteria NUTRITIONALLY DEFICIENT STREPTOCOCCUS  Final    Comment: IDENTIFIED AS GRANULICATELLA ADIACENS Standardized susceptibility testing for this organism is not available. Gram Stain Report Called to,Read Back By and Verified With: CHRISTY Y 11/03/15 1330 BY SMITHERSJ   Culture, blood (routine x 2)     Status: Abnormal   Collection Time: 11/03/15  5:22 PM  Result Value Ref Range Status   Specimen Description LEFT ANTECUBITAL  Final   Special Requests BOTTLES DRAWN AEROBIC AND ANAEROBIC 8CC EACH  Final   Culture  Setup Time   Final    GRAM POSITIVE COCCI IN CHAINS Gram Stain Report Called to,Read Back By and Verified With: CLOER K. MC AT 1304 ON [id number] BY THOMPSON S.    Culture (A)  Final    MICROAEROPHILIC STREPTOCOCCI Standardized susceptibility testing for this organism is not available. [facility]formed [facility]    Report Status 11/06/2015 FINAL  Final  Culture, blood (routine x 2)     Status: Abnormal   Collection Time: 11/03/15  5:27 PM  Result Value Ref Range Status   Specimen Description BLOOD LEFT HAND   Final   Special Requests BOTTLES DRAWN AEROBIC AND ANAEROBIC [facility]6CC EACH  Final   Culture  Setup Time   Final    GRAM POSITIVE COCCI IN CHAINS Gram Stain Report Called to,Read Back By and Verified With: CLOER K. MC AT 1304 ON [id number] BY THOMPSON S.    Culture (A)  Final    MICROAEROPHILIC STREPTOCOCCI Standardized susceptibility testing for this organism is not available. Performed at [facility]    Report Status 11/06/2015 FINAL  Final  Blood culture (routine x 2)     Status: Abnormal   Collection Time: 11/03/15  7:47 PM  Result Value Ref Range Status   Specimen Description BLOOD RIGHT ARM  Final   Special Requests BOTTLES DRAWN AEROBIC AND ANAEROBIC [facility]6CC EACH  Final   Culture  Setup Time   Final    GRAM POSITIVE COCCI IN CHAINS Gram Stain Report Called to,Read Back By and Verified With: CLOER K. MC AT 1304 ON [id number] BY THOMPSON S.    Culture (A)  Final    MICROAEROPHILIC STREPTOCOCCI Standardized susceptibility testing for this organism is not available. Performed at [facility]    Report Status 11/06/2015 FINAL  Final  Blood culture (routine x 2)     Status: Abnormal   Collection Time: 11/03/15  7:55 PM  Result Value Ref Range Status   Specimen Description BLOOD  LEFT ARM  Final   Special Requests BOTTLES DRAWN AEROBIC AND ANAEROBIC [facility] EACH  Final   Culture  Setup Time   Final    GRAM POSITIVE COCCI IN CHAINS Gram Stain Report Called to,Read Back By and Verified With: CLOER K. MC AT 1304 ON [id number] BY THOMPSON S.    Culture (A)  Final    MICROAEROPHILIC STREPTOCOCCI Standardized susceptibility testing for this organism is not available. Performed at [facility]    Report Status 11/06/2015 FINAL  Final  Surgical pcr screen     Status: None   Collection Time: 11/04/15  9:44 PM  Result Value Ref Range Status   MRSA, PCR NEGATIVE NEGATIVE Final   Staphylococcus aureus NEGATIVE NEGATIVE Final    Comment:        The Xpert SA Assay (FDA approved for  NASAL specimens in patients over 29 years of age), is one component of a comprehensive surveillance program.  Test performance has been validated by [facility] for patients greater than or equal to 81 year old. It is not intended to diagnose infection nor to guide or monitor treatment.   Culture, blood (routine x 2)     Status: None (Preliminary result)   Collection Time: 11/05/15  2:30 PM  Result Value Ref Range Status   Specimen Description BLOOD RIGHT ANTECUBITAL  Final   Special Requests BOTTLES DRAWN AEROBIC AND ANAEROBIC 5CC  Final   Culture NO GROWTH 3 DAYS  Final   Report Status PENDING  Incomplete  Culture, blood (routine x 2)     Status: None (Preliminary result)   Collection Time: 11/05/15  2:45 PM  Result Value Ref Range Status   Specimen Description BLOOD RIGHT ANTECUBITAL  Final   Special Requests BOTTLES DRAWN AEROBIC AND ANAEROBIC 5CC  Final   Culture NO GROWTH 3 DAYS  Final   Report Status PENDING  Incomplete     Labs: Basic Metabolic Panel:  Recent Labs Lab 11/03/15 1722 11/04/15 0436 11/06/15 0326 11/07/15 0349 11/08/15 0325  NA 130* 134* 133* 138 136  K 3.5 3.6 3.5 3.8 3.6  CL 100* 99* 99* 100* 99*  CO2 23 23 26 25 26   GLUCOSE 156* 156* 133* 120* 115*  BUN 12 10 14 12 13   CREATININE 0.83 0.89 1.02 1.00 1.09  CALCIUM 8.4* 8.5* 8.4* 8.7* 8.6*   Liver Function Tests:  Recent Labs Lab 11/03/15 1722  AST 29  ALT 19  ALKPHOS 119  BILITOT 0.9  PROT 7.9  ALBUMIN 3.3*   CBC:  Recent Labs Lab 11/03/15 1722 11/04/15 0436 11/05/15 0404 11/08/15 1004  WBC 7.4 7.9 6.4 5.0  NEUTROABS 5.9  --   --   --   HGB 8.4* 7.7* 7.3* 8.8*  HCT 28.3* 26.6* 24.5* 30.8*  MCV 77.5* 76.4* 77.3* 77.8*  PLT 180 193 187 272   Cardiac Enzymes:  Recent Labs Lab 11/03/15 1945 11/04/15 0436 11/04/15 0907 11/04/15 1100 11/04/15 1445  TROPONINI 0.36* 0.33* 0.26* 0.28* 0.27*   BNP: BNP (last 3 results)  Recent Labs  11/03/15 1722  BNP 398.0*     CBG:  Recent Labs Lab 11/07/15 1132 11/07/15 1624 11/07/15 2108 11/08/15 0613 11/08/15 1137  GLUCAP 101* 99 105* 106* 152*     IMAGING STUDIES Dg Orthopantogram  11/07/2015  CLINICAL DATA:  Known endocarditis EXAM: ORTHOPANTOGRAM/PANORAMIC COMPARISON:  None. FINDINGS: Panoramic view of the mandible was obtained. No acute fracture is seen. There are some changes consistent with dental caries particularly  in the central and lateral incisors on the left in the maxilla as well as what appears to be the second bicuspid on the left in the maxilla. Lucency is also noted within the second bicuspid and first molar on the right in the maxilla. IMPRESSION: Changes consistent with dental caries as described. Electronically Signed   By: Alcide Clever M.D.   On: 11/07/2015 20:02   Dg Chest 2 View  11/03/2015  CLINICAL DATA:  Fatigue, chills and shortness of breath 3 weeks. EXAM: CHEST  2 VIEW COMPARISON:  05/06/2011 FINDINGS: The sternotomy wires unchanged. Lungs are adequately inflated with without consolidation or effusion. Stable cardiomegaly. Prosthetic mitral valve unchanged. Remainder exam is unchanged. The IMPRESSION: No active cardiopulmonary disease. Electronically Signed   By: Elberta Fortis M.D.   On: 11/03/2015 17:12   Mr Cervical Spine Wo Contrast  10/27/2015  CLINICAL DATA:  Neck pain radiating to the right arm for 1 month. EXAM: MRI CERVICAL SPINE WITHOUT CONTRAST TECHNIQUE: Multiplanar, multisequence MR imaging of the cervical spine was performed. No intravenous contrast was administered. COMPARISON:  04/19/2011 FINDINGS: The study is motion degraded despite repeating some sequences. Axial series are moderately motion degraded, particularly limiting assessment for neural foraminal stenosis. Vertebral alignment is normal. Vertebral bone marrow signal is moderately diminished diffusely, nonspecific but most likely reflective of patient's underlying anemia. No significant vertebral marrow  edema is seen. Vertebral body heights are preserved. Patchy T2 hyperintensities are present in the pons, nonspecific but likely reflective of chronic small vessel ischemia in this patient with hypertension and diabetes. The cervical spinal cord is normal in caliber without definite signal abnormality identified within limitations of motion artifact. Paraspinal soft tissues are unremarkable. C2-3:  No disc herniation or stenosis. C3-4: Mild disc bulging and minimal facet arthrosis without spinal stenosis or evidence of significant neural foraminal stenosis allowing for motion artifact. C4-5: Mild disc bulging mild disc bulging without spinal canal or definite neural foraminal stenosis. C5-6:  Mild disc bulging without stenosis. C6-7: Circumferential disc osteophyte complex greater to the left results in mild right and moderate left neural foraminal stenosis, grossly similar to the prior study. No spinal stenosis. C7-T1: Mild disc bulging and mild facet arthrosis result in mild left neural foraminal stenosis, similar to the prior study. No spinal stenosis. IMPRESSION: 1. Motion degraded examination without definite right-sided neural impingement identified. 2. Mild right and moderate left neural foraminal stenosis at C6-7, similar to the prior study. 3. Diffusely diminished bone marrow signal intensity compatible with known underlying anemia. Electronically Signed   By: Sebastian Ache M.D.   On: 10/27/2015 08:25    DISCHARGE EXAMINATION: Filed Vitals:   11/07/15 2039 11/07/15 2112 11/08/15 0640 11/08/15 1400  BP:  106/74 104/62 120/73  Pulse: 80 67 78 80  Temp:  97.8 F (36.6 C) 97.6 F (36.4 C) 98 F (36.7 C)  TempSrc:  Oral Oral Oral  Resp: 17 18 18 20   Height:      Weight:   124.9 kg (275 lb 5.7 oz)   SpO2: 97% 96% 96% 98%   General appearance: alert, cooperative, appears stated age and no distress Cardio: regular rate and rhythm, S1, S2 normal, no murmur, click, rub or gallop GI: soft,  non-tender; bowel sounds normal; no masses,  no organomegaly Extremities: extremities normal, atraumatic, no cyanosis or edema   DISPOSITION: Home with home health  Discharge Instructions    Call MD for:  difficulty breathing, headache or visual disturbances    Complete by:  As directed      Call MD for:  extreme fatigue    Complete by:  As directed      Call MD for:  persistant dizziness or light-headedness    Complete by:  As directed      Call MD for:  persistant nausea and vomiting    Complete by:  As directed      Call MD for:  severe uncontrolled pain    Complete by:  As directed      Call MD for:  temperature >100.4    Complete by:  As directed      Discharge instructions    Complete by:  As directed   Please talk to your PCP about referral to a hematologist for your anemia. Please follow up with your Dentist as you may have infection in your teeth which could be the reason for the infection in the blood.  You were cared for by a hospitalist during your hospital stay. If you have any questions about your discharge medications or the care you received while you were in the hospital after you are discharged, you can call the unit and asked to speak with the hospitalist on call if the hospitalist that took care of you is not available. Once you are discharged, your primary care physician will handle any further medical issues. Please note that NO REFILLS for any discharge medications will be authorized once you are discharged, as it is imperative that you return to your primary care physician (or establish a relationship with a primary care physician if you do not have one) for your aftercare needs so that they can reassess your need for medications and monitor your lab values. If you do not have a primary care physician, you can call [phone] for a physician referral.     Increase activity slowly    Complete by:  As directed            ALLERGIES:  Allergies  Allergen Reactions   . Invokana [Canagliflozin] Rash     Current Discharge Medication List    START taking these medications   Details  cefTRIAXone (ROCEPHIN) 2 g SOLR injection Inject 2 g into the vein daily. For 6 weeks from 11/05/15 Qty: 39 each, Refills: 0      CONTINUE these medications which have NOT CHANGED   Details  acetaminophen (TYLENOL) 650 MG CR tablet Take 1,300 mg by mouth daily.    aspirin (ECOTRIN) 325 MG EC tablet Take 325 mg by mouth daily.    Azelastine-Fluticasone (DYMISTA) 137-50 MCG/ACT SUSP Place 2 sprays into both nostrils at bedtime. Qty: 1 Bottle, Refills: 0    Cholecalciferol (VITAMIN D3) 5000 units CAPS Take 1 capsule by mouth daily.    diazepam (VALIUM) 5 MG tablet Take 5 mg by mouth at bedtime.  Refills: 0    Exenatide ER (BYDUREON) 2 MG PEN Inject 2 mg into the skin every Sunday.     Ferrous Sulfate-C-Folic Acid 105-500-0.8 MG TBCR Take 500 mg by mouth daily.    FLUoxetine (PROZAC) 40 MG capsule Take 40 mg by mouth daily.    furosemide (LASIX) 40 MG tablet Take 40 mg by mouth Daily.     gabapentin (NEURONTIN) 300 MG capsule TAKE ONE CAPSULE BY MOUTH ONCE DAILY AT BEDTIME. Refills: 0    HYDROcodone-acetaminophen (NORCO/VICODIN) 5-325 MG tablet take 1 tablet by mouth every 6 hours if needed for SEVERE PAIN Refills: 0    levothyroxine (SYNTHROID, LEVOTHROID) 112 MCG tablet  Take 112 mcg by mouth daily before breakfast.  Refills: 1    lisinopril (PRINIVIL,ZESTRIL) 10 MG tablet Take 10 mg by mouth daily.    metFORMIN (GLUCOPHAGE-XR) 500 MG 24 hr tablet Take 500 mg by mouth 2 (two) times daily. Refills: 0    metoprolol tartrate (LOPRESSOR) 25 MG tablet take 1 tablet by mouth once daily Qty: 90 tablet, Refills: 3    omeprazole (PRILOSEC) 20 MG capsule Take 20 mg by mouth at bedtime.     pramipexole (MIRAPEX) 0.25 MG tablet Take 2 tablets by mouth after dinner each night Qty: 60 tablet, Refills: 3    rivaroxaban (XARELTO) 20 MG TABS tablet Take 1 tablet (20  mg total) by mouth daily. Qty: 30 tablet, Refills: 11    simvastatin (ZOCOR) 40 MG tablet take 1 tablet by mouth at bedtime Qty: 90 tablet, Refills: 1       Follow-up Information    Follow up with [facility].   Why:  HH-RN arranged for IV abx   Contact information:   [address] [place] [address] [phone]       Follow up with TAPPER,DAVID B, MD.   Specialty:  Family Medicine   Why:  please talk to Dr. Margo Common about referral to Hematology for anemia   Contact information:   [address] [place] [address] [phone]       Follow up with Judyann Munson, MD.   Specialty:  Infectious Diseases   Why:  Her office witll schedule appointment   Contact information:   [address] [place] [address] [phone]       TOTAL DISCHARGE TIME: 35 minutes  [facility]  [facility] Pager [phone]  11/08/2015, 4:09 PM

## 2015-11-08 NOTE — Discharge Instructions (Signed)
Bacteremia °Bacteremia is the presence of bacteria in the blood. A small amount of bacteria may not cause any symptoms. °Sometimes, the bacteria spread and cause infection in other parts of the body, such as the heart, joints, bones, or brain. Having a great amount of bacteria can cause a serious, sometimes life-threatening infection called sepsis. °CAUSES °This condition is caused by bacteria that get into the blood. Bacteria can enter the blood: °· During a dental or medical procedure. °· After you brush your teeth so hard that the gums bleed. °· Through a scrape or cut on your skin. °More severe types of bacteremia can be caused by: °· A bacterial infection, such as pneumonia, that spreads to the blood. °· Using a dirty needle. °RISK FACTORS °This condition is more likely to develop in: °· Children and elderly adults. °· People who have a long-lasting (chronic) disease or medical condition. °· People who have an artificial joint or heart valve. °· People who have heart valve disease. °· People who have a tube, such as a catheter or IV tube, that has been inserted for a medical treatment. °· People who have a weak body defense system (immune system). °· People who use IV drugs. °SYMPTOMS °Usually, this condition does not cause symptoms when it is mild. When it is more serious, it may cause: °· Fever. °· Chills. °· Racing heart. °· Shortness of breath. °· Dizziness. °· Weakness. °· Confusion. °· Nausea or vomiting. °· Diarrhea. °Bacteremia that has spread to other parts of the body may cause symptoms in those areas. °DIAGNOSIS °This condition may be diagnosed with a physical exam and tests, such as: °· A complete blood count (CBC). This test looks for signs of infection. °· Blood cultures. These look for bacteria in your blood. °· Tests of any IV tubes. These look for a source of infection. °· Urine tests. °· Imaging tests, such as an X-ray, CT scan, MRI, or heart ultrasound. °TREATMENT °If the condition is mild,  treatment is usually not needed. Usually, the body's immune system will remove the bacteria. If the condition is more serious, it may be treated with: °· Antibiotic medicines through an IV tube. These may be given for about 2 weeks. At first, the antibiotic that is given may kill most types of blood bacteria. If your test results show that a certain kind of bacteria is causing problems, the antibiotic may be changed to kill only the bacteria that are causing problems. °· Antibiotics taken by mouth. °· Removing any catheter or IV tube that is a source of infection. °· Blood pressure and breathing support, if needed. °· Surgery to control the source or spread of infection, if needed. °HOME CARE INSTRUCTIONS °· Take over-the-counter and prescription medicines only as told by your health care provider. °· If you were prescribed an antibiotic, take it as told by your health care provider. Do not stop taking the antibiotic even if you start to feel better. °· Rest at home until your condition is under control. °· Drink enough fluid to keep your urine clear or pale yellow. °· Keep all follow-up visits as told by your health care provider. This is important. °PREVENTION °Take these actions to help prevent future episodes of bacteremia: °· Get all vaccinations as recommended by your health care provider. °· Clean and cover scrapes or cuts. °· Bathe regularly. °· Wash your hands often. °· Before any dental or surgical procedure, ask your health care provider if you should take an antibiotic. °SEEK MEDICAL   CARE IF: °· Your symptoms get worse. °· You continue to have symptoms after treatment. °· You develop new symptoms after treatment. °SEEK IMMEDIATE MEDICAL CARE IF: °· You have chest pain or trouble breathing. °· You develop confusion, dizziness, or weakness. °· You develop pale skin. °  °This information is not intended to replace advice given to you by your health care provider. Make sure you discuss any questions you have  with your health care provider. °  °Document Released: 03/24/2006 Document Revised: 03/01/2015 Document Reviewed: 08/13/2014 °Elsevier Interactive Patient Education ©2016 [employer] ° °

## 2015-11-08 NOTE — Progress Notes (Signed)
[facility]  Patient Status:  New pt with [facility] this admission though [facility] and home IV ABX with us in the past.  [facility] is providing the following services: HHRN and Home Infusion Pharmacy Team for home IV ABX. [facility] coordinator will provide in hospital teaching with pt and/or wife to support independence at home.  [facility] Ro[facility]phin to start at home 11-09-15 8-10 AM.    If patient discharges after hours, please call [phone].   Sedalia Mutaamela S Chandler 11/08/2015, 3:18 PM

## 2015-11-08 NOTE — Progress Notes (Signed)
Peripherally Inserted Central Catheter/Midline Placement  The IV Nurse has discussed with the patient and/or persons authorized to consent for the patient, the purpose of this procedure and the potential benefits and risks involved with this procedure.  The benefits include less needle sticks, lab draws from the catheter and patient may be discharged home with the catheter.  Risks include, but not limited to, infection, bleeding, blood clot (thrombus formation), and puncture of an artery; nerve damage and irregular heat beat.  Alternatives to this procedure were also discussed.  PICC/Midline Placement Documentation        Lisabeth DevoidGibbs, Chelsie Burel Jeanette 11/08/2015, 3:03 PM

## 2015-11-08 NOTE — Progress Notes (Signed)
Patient Name: [name] [name] Date of Encounter: 11/08/2015     Principal Problem:   Streptococcal bacteremia Active Problems:   Obstructive sleep apnea   S/P AVR (aortic valve replacement)   S/P mitral valve repair   History of endocarditis   Type 2 diabetes mellitus with vascular disease (HCC)   Fatigue   Chills   Elevated troponin I level   Anemia   Unintentional weight loss   Endocarditis   Poor dentition    SUBJECTIVE  No complaints. "flashes" of being flushed have now gone away since starting Abx. No CP or SOb.   CURRENT MEDS . ampicillin (OMNIPEN) IV  2 g Intravenous Q4H  . aspirin  325 mg Oral Daily  . diazepam  5 mg Oral QHS  . FLUoxetine  40 mg Oral Daily  . furosemide  40 mg Oral Daily  . gabapentin  300 mg Oral QHS  . gentamicin  100 mg Intravenous Q12H  . insulin aspart  0-15 Units Subcutaneous TID WC  . levothyroxine  112 mcg Oral QAC breakfast  . lisinopril  10 mg Oral Daily  . metFORMIN  500 mg Oral BID WC  . metoprolol tartrate  25 mg Oral QPM  . multivitamins with iron  1 tablet Oral Daily  . pantoprazole  40 mg Oral Daily  . pramipexole  0.25 mg Oral QHS  . rivaroxaban  20 mg Oral Q supper  . simvastatin  40 mg Oral QHS  . sodium chloride flush  3 mL Intravenous Q12H    OBJECTIVE  Filed Vitals:   11/07/15 1322 11/07/15 2039 11/07/15 2112 11/08/15 0640  BP: 112/76  106/74 104/62  Pulse: 88 80 67 78  Temp: 97.7 F (36.5 C)  97.8 F (36.6 C) 97.6 F (36.4 C)  TempSrc: Oral  Oral Oral  Resp: 18 17 18 18   Height:      Weight:    275 lb 5.7 oz (124.9 kg)  SpO2: 98% 97% 96% 96%    Intake/Output Summary (Last 24 hours) at 11/08/15 0816 Last data filed at 11/07/15 1700  Gross per 24 hour  Intake    720 ml  Output   1750 ml  Net  -1030 ml   Filed Weights   11/03/15 1638 11/07/15 0554 11/08/15 0640  Weight: 264 lb (119.75 kg) 272 lb 11.3 oz (123.7 kg) 275 lb 5.7 oz (124.9 kg)    PHYSICAL EXAM  General: Pleasant, NAD.  obese Neuro: Alert and oriented X 3. Moves all extremities spontaneously. Psych: Normal affect. HEENT:  Normal  Neck: Supple without bruits or JVD. Lungs:  Resp regular and unlabored, CTA. Heart: RRR no s3, s4, or + murmur. Abdomen: Soft, non-tender, non-distended, BS + x 4.  Extremities: No clubbing, cyanosis or edema. DP/PT/Radials 2+ and equal bilaterally.  Accessory Clinical Findings  CBC No results for input(s): WBC, NEUTROABS, HGB, HCT, MCV, PLT in the last 72 hours. Basic Metabolic Panel  Recent Labs  11/07/15 0349 11/08/15 0325  NA 138 136  K 3.8 3.6  CL 100* 99*  CO2 25 26  GLUCOSE 120* 115*  BUN 12 13  CREATININE 1.00 1.09  CALCIUM 8.7* 8.6*    TELE  NSR with PVCs  Radiology/Studies  Dg Orthopantogram  11/07/2015  CLINICAL DATA:  Known endocarditis EXAM: ORTHOPANTOGRAM/PANORAMIC COMPARISON:  None. FINDINGS: Panoramic view of the mandible was obtained. No acute fracture is seen. There are some changes consistent with dental caries particularly in the central and lateral incisors on  the left in the maxilla as well as what appears to be the second bicuspid on the left in the maxilla. Lucency is also noted within the second bicuspid and first molar on the right in the maxilla. IMPRESSION: Changes consistent with dental caries as described. Electronically Signed   By: Alcide Clever M.D.   On: 11/07/2015 20:02   Dg Chest 2 View  11/03/2015  CLINICAL DATA:  Fatigue, chills and shortness of breath 3 weeks. EXAM: CHEST  2 VIEW COMPARISON:  05/06/2011 FINDINGS: The sternotomy wires unchanged. Lungs are adequately inflated with without consolidation or effusion. Stable cardiomegaly. Prosthetic mitral valve unchanged. Remainder exam is unchanged. The IMPRESSION: No active cardiopulmonary disease. Electronically Signed   By: Elberta Fortis M.D.   On: 11/03/2015 17:12   Mr Cervical Spine Wo Contrast  10/27/2015  CLINICAL DATA:  Neck pain radiating to the right arm for 1 month.  EXAM: MRI CERVICAL SPINE WITHOUT CONTRAST TECHNIQUE: Multiplanar, multisequence MR imaging of the cervical spine was performed. No intravenous contrast was administered. COMPARISON:  04/19/2011 FINDINGS: The study is motion degraded despite repeating some sequences. Axial series are moderately motion degraded, particularly limiting assessment for neural foraminal stenosis. Vertebral alignment is normal. Vertebral bone marrow signal is moderately diminished diffusely, nonspecific but most likely reflective of patient's underlying anemia. No significant vertebral marrow edema is seen. Vertebral body heights are preserved. Patchy T2 hyperintensities are present in the pons, nonspecific but likely reflective of chronic small vessel ischemia in this patient with hypertension and diabetes. The cervical spinal cord is normal in caliber without definite signal abnormality identified within limitations of motion artifact. Paraspinal soft tissues are unremarkable. C2-3:  No disc herniation or stenosis. C3-4: Mild disc bulging and minimal facet arthrosis without spinal stenosis or evidence of significant neural foraminal stenosis allowing for motion artifact. C4-5: Mild disc bulging mild disc bulging without spinal canal or definite neural foraminal stenosis. C5-6:  Mild disc bulging without stenosis. C6-7: Circumferential disc osteophyte complex greater to the left results in mild right and moderate left neural foraminal stenosis, grossly similar to the prior study. No spinal stenosis. C7-T1: Mild disc bulging and mild facet arthrosis result in mild left neural foraminal stenosis, similar to the prior study. No spinal stenosis. IMPRESSION: 1. Motion degraded examination without definite right-sided neural impingement identified. 2. Mild right and moderate left neural foraminal stenosis at C6-7, similar to the prior study. 3. Diffusely diminished bone marrow signal intensity compatible with known underlying anemia.  Electronically Signed   By: Sebastian Ache M.D.   On: 10/27/2015 08:25    ASSESSMENT AND PLAN  1. GPC bactermia --2/2 blood cultures on 5/12 positive for microaerophilic streptococci. Concern for endocarditis due to a new murmur heard by his cardiologist in clinic as well as hx of endocarditis and bioprosthetic valve. TEE was done 11/06/15 was negative for vegetations or evidence of endocarditis. ECHO report did note an elevated transvalvular gradient indicating severe stenosis with a nl appearing bioprosthetic valve. Likely increased aortic gradient due to his underlying anemia which he has received an iron transfusion for in the past w/ neg EGD and colonoscopy workup. Per Dr. Royann Shivers, his anorexia and fatigue are compatible with subacute bacterial endocarditis and the organism grown has been incriminated as a cause of endocarditis. Therefore, plan is to treat for endocarditis with IV antibiotic therapy. Plan to placed PICC line. No indication for surgery at this time.    2. Microcyctic anemia-- hgb 7.3, MCV 77.3 on 11/05/15. No further lab  work. Will order CBC. Neg EGD and colonoscopy per chart review. Will recheck iron studies. His iron deficiency anemia did not respond to oral supplements and he needed IV iron in the recent past. Anemia could also explain some of his symptoms./   Signed, Cline CrockKathryn Thompson PA-C  Pager [phone]  Patient examined seen yesterday. No change in murmur AVR no SBE on TEE despite high gradients PIC line today 6 weeks antibiotics Iron studies pending if low consider repeating IV iron Rx as a lot Of his symptoms may be from anemia  Charlton HawsPeter Pritika Alvarez

## 2015-11-09 NOTE — Care Management Note (Signed)
Case Management Note Donn PieriniKristi Jeannemarie Sawaya RN, BSN Unit 2W-Case Manager [phone]  Patient Details  Name: [name] PearlSteven C [name] MRN: [id number] Date of Birth: 10/29/1950  Subjective/Objective:   Pt admitted with endocarditis, plan for PICC and iv abx at home                 Action/Plan: PTA pt lived at home with wife- plan to return home with wife- pt will need HH services for IV abx- spoke with pt at bedside- [facility] [place]- [facility] that [facility], is fine with using them again- [facility]- [facility]- pt will need HHRN orders once PICC placed in am for home IV abx.   Expected Discharge Date:    11/08/15              Expected Discharge Plan:  Home w Home Health Services  In-House Referral:     Discharge planning Services  CM Consult  Post Acute Care Choice:  Home Health Choice offered to:  Patient  DME Arranged:    DME Agency:     HH Arranged:  RN, IV Antibiotics HH Agency:  [facility]  Status of Service:  Completed, signed off  Medicare Important Message Given:  Yes Date Medicare IM Given:    Medicare IM give by:    Date Additional Medicare IM Given:    Additional Medicare Important Message give by:     If discussed at Long Length of Stay Meetings, dates discussed:    Additional Comments:  Darrold SpanWebster, Yazir Koerber Hall, RN 11/09/2015, 4:25 PM

## 2015-11-10 LAB — CULTURE, BLOOD (ROUTINE X 2)
CULTURE: NO GROWTH
Culture: NO GROWTH

## 2015-11-14 ENCOUNTER — Telehealth: Payer: Self-pay | Admitting: *Deleted

## 2015-11-14 NOTE — Telephone Encounter (Signed)
Called [facility] to check length of course and was advised the patient started dose 11/09/15 and was to be on it for 39 days. Advised her should be 4 weeks which would put his stop date at 12/06/15. Corrie DandyMary advised will change the stop date per Dr Drue SecondSnider verbal order.

## 2015-11-14 NOTE — Telephone Encounter (Signed)
-----   Message from Judyann Munsonynthia Snider, MD sent at 11/11/2015  8:03 AM EDT ----- Can you check with [facility] to see that he is on 4 wk of ceftriaxone 2gm IV daily not 6 wk. thanks

## 2015-11-21 ENCOUNTER — Encounter: Payer: Self-pay | Admitting: Cardiovascular Disease

## 2015-11-23 ENCOUNTER — Encounter: Payer: Self-pay | Admitting: Internal Medicine

## 2015-11-23 ENCOUNTER — Ambulatory Visit (HOSPITAL_COMMUNITY)
Admission: RE | Admit: 2015-11-23 | Discharge: 2015-11-23 | Disposition: A | Discharge status: Home / Self Care | Payer: Medicare Other | Source: Ambulatory Visit | Attending: Family Medicine | Admitting: Family Medicine

## 2015-11-23 ENCOUNTER — Ambulatory Visit (INDEPENDENT_AMBULATORY_CARE_PROVIDER_SITE_OTHER): Payer: Medicare Other | Admitting: Internal Medicine

## 2015-11-23 VITALS — BP 127/79 | HR 66 | Temp 98.1°F | Ht 71.0 in | Wt 284.0 lb

## 2015-11-23 DIAGNOSIS — T826XXA Infection and inflammatory reaction due to cardiac valve prosthesis, initial encounter: Secondary | ICD-10-CM | POA: Diagnosis not present

## 2015-11-23 DIAGNOSIS — D500 Iron deficiency anemia secondary to blood loss (chronic): Secondary | ICD-10-CM

## 2015-11-23 DIAGNOSIS — D509 Iron deficiency anemia, unspecified: Secondary | ICD-10-CM | POA: Insufficient documentation

## 2015-11-23 DIAGNOSIS — T826XXS Infection and inflammatory reaction due to cardiac valve prosthesis, sequela: Secondary | ICD-10-CM

## 2015-11-23 DIAGNOSIS — Y838 Other surgical procedures as the cause of abnormal reaction of the patient, or of later complication, without mention of misadventure at the time of the procedure: Secondary | ICD-10-CM | POA: Diagnosis not present

## 2015-11-23 DIAGNOSIS — I38 Endocarditis, valve unspecified: Secondary | ICD-10-CM | POA: Diagnosis not present

## 2015-11-23 DIAGNOSIS — K029 Dental caries, unspecified: Secondary | ICD-10-CM | POA: Diagnosis not present

## 2015-11-23 LAB — ABO/RH: ABO/RH(D): A POS

## 2015-11-23 MED ORDER — SODIUM CHLORIDE 0.9 % IV SOLN: Freq: Once | INTRAVENOUS | Status: DC

## 2015-11-23 NOTE — Progress Notes (Signed)
Provider: Judyann Munsonynthia Snider MD  Associated Diagnosis: Prosthetic Valve Endocarditis, Iron def. Anemia.  Procedure: venipuncture blood draw from left arm for type and screen and cross match for 1 unit PRBC's.   Patient cannot stay today for the transfusion but will be back tomorrow for the transfusion. Blue blood band placed and patient educated about keeping the blood band on until he returns tomorrow. He agreed.  Discharged to home.

## 2015-11-23 NOTE — Progress Notes (Signed)
   Subjective:    Patient ID: [name] [name], male    DOB: 08/20/1950, 65 y.o.   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 Due to concern for nephrotoxicity, we will keep him on gentamicin while hospitalized, but can discharge him on ceftriaxone 2gm IV daily  Source of NVS is usually oral-GI-GU source. He reports having colonoscopy in February which was normal. He has poor dentition with recent teeth chipping off front tooth. Will check panorex, please have dental evaluate for any dental extractions  Review of Systems +fatigue, mild doe with physical activity    Objective:   Physical Exam BP 127/79 mmHg  Pulse 66  Temp(Src) 98.1 F (36.7 C) (Oral)  Ht 5\' 11"  (1.803 m)  Wt 284 lb (128.822 kg)  BMI 39.63 kg/m2 Physical Exam  Constitutional: He is oriented to person, place, and time. He appears well-developed and well-nourished. No distress.  HENT: poor dentition.  Mouth/Throat: Oropharynx is clear and moist. No oropharyngeal exudate.  Cardiovascular: Normal rate, regular rhythm and normal heart sounds.soft ejection murmur 2/6 BUSB No murmur heard.  Pulmonary/Chest: Effort normal and breath sounds normal. No respiratory distress. He has no wheezes.  Abdominal: Soft. Bowel sounds are normal. He exhibits no distension. There is no tenderness.  Lymphadenopathy:  He has no cervical adenopathy.    Neurological: He is alert and oriented to person, place, and time.  Skin: Skin is warm and dry. No rash noted. No erythema.  Psychiatric: He has a normal mood and affect. His behavior is normal.          Assessment & Plan:  nvs PVE = plan for 6 wk of ceftriaxone 2gm iv daily. Currently day 19 of 42. Will plan to end abtx on June 24th.  Anemia = has had prior iron tsf but stores are still low based on labs from 5/17. Plan to give 1 urbc and discuss with pcp if he needs further work up.  Dental caries = recommended to see dentistry  See back in 4 wk

## 2015-11-24 ENCOUNTER — Ambulatory Visit (HOSPITAL_COMMUNITY)
Admission: RE | Admit: 2015-11-24 | Discharge: 2015-11-24 | Disposition: A | Discharge status: Home / Self Care | Payer: Medicare Other | Source: Ambulatory Visit | Attending: Family Medicine | Admitting: Family Medicine

## 2015-11-24 DIAGNOSIS — T826XXA Infection and inflammatory reaction due to cardiac valve prosthesis, initial encounter: Secondary | ICD-10-CM | POA: Diagnosis not present

## 2015-11-24 LAB — PREPARE RBC (CROSSMATCH)

## 2015-11-24 MED ORDER — SODIUM CHLORIDE 0.9 % IV SOLN
Freq: Once | INTRAVENOUS | Status: AC
  Administered 2015-11-24: 12:00:00 via INTRAVENOUS

## 2015-11-24 MED ORDER — HEPARIN SOD (PORK) LOCK FLUSH 100 UNIT/ML IV SOLN
250.0000 [IU] | INTRAVENOUS | Status: AC | PRN
  Administered 2015-11-24: 250 [IU]
  Filled 2015-11-24: qty 5

## 2015-11-24 MED ORDER — SODIUM CHLORIDE 0.9% FLUSH
10.0000 mL | INTRAVENOUS | Status: AC | PRN
  Administered 2015-11-24: 10 mL

## 2015-11-24 NOTE — Progress Notes (Signed)
Patient ID: [name] [name], male   DOB: 12/29/1950, 65 y.o.   MRN: [id number] Provider: Judyann Munsonynthia Snider MD  Associated Diagnosis: Prosthetic Valve Endocarditis, Iron def. Anemia.  Procedure: Transfusion of 1 unit PRBC via PICC line in right upper arm.  Patient tolerated transfusion well. No reaction. PICC line flushed per protocol Went over discharge instructions and copy given to patient. He understands instructions. Alert, oriented and ambulatory at time of discharge. Discharged to home....wife present. Two copies of [facility] Direct[facility] given to pt. And his wife per request from patient.

## 2015-11-24 NOTE — Discharge Instructions (Signed)

## 2015-11-27 ENCOUNTER — Telehealth: Payer: Self-pay | Admitting: Cardiology

## 2015-11-27 ENCOUNTER — Encounter: Payer: Self-pay | Admitting: Cardiology

## 2015-11-27 LAB — TYPE AND SCREEN
ABO/RH(D): A POS
ANTIBODY SCREEN: NEGATIVE
Unit division: 0
Unit division: 0

## 2015-11-28 ENCOUNTER — Telehealth: Payer: Self-pay | Admitting: *Deleted

## 2015-11-28 NOTE — Telephone Encounter (Signed)
1 unit packed red blood cell transfusion ordered per verbal order from Dr. Drue SecondSnider. Orders faxed to [facility].  Patient made aware, he will call [facility] to confirm appointment. Andree CossHowell, Michelle M, RN

## 2015-11-28 NOTE — Telephone Encounter (Addendum)
Patient called to let Dr. Drue SecondSnider know that he is not feeling any better since having the blood transfusion. Called Corrie DandyMary with [facility] and he had blood drawn yesterday and his hemoglobin is 8.7 and hematocrit is 29.4. Dr. Drue SecondSnider notified and she gave an order for infusion at [facility]. Micael HampshireMichelle Howell, RN notified and she is arranging with the infusion center and will notify the patient.

## 2015-11-29 ENCOUNTER — Ambulatory Visit (HOSPITAL_COMMUNITY)
Admission: RE | Admit: 2015-11-29 | Discharge: 2015-11-29 | Disposition: A | Discharge status: Home / Self Care | Payer: Medicare Other | Source: Ambulatory Visit | Attending: Family Medicine | Admitting: Family Medicine

## 2015-11-29 DIAGNOSIS — T826XXA Infection and inflammatory reaction due to cardiac valve prosthesis, initial encounter: Secondary | ICD-10-CM | POA: Diagnosis not present

## 2015-11-29 MED ORDER — SODIUM CHLORIDE 0.9% FLUSH
10.0000 mL | INTRAVENOUS | Status: AC | PRN
  Administered 2015-11-29: 10 mL

## 2015-11-29 MED ORDER — HEPARIN SOD (PORK) LOCK FLUSH 100 UNIT/ML IV SOLN
250.0000 [IU] | INTRAVENOUS | Status: AC | PRN
  Administered 2015-11-29: 250 [IU]
  Filled 2015-11-29: qty 5

## 2015-11-29 NOTE — Progress Notes (Signed)
Provider: Judyann MunsonSnider, Cynthia MD  Procedure: Blood draw from PICC for type and screen for 1 unit PRBC's. To be transfused on 12/01/15.   Patient tolerated this well. Went over discharge instructions for return on Friday 12/01/15.  Discharged to home. Alert, oriented and ambulatory.

## 2015-11-29 NOTE — Telephone Encounter (Signed)
Closed encounter °

## 2015-11-29 NOTE — Discharge Instructions (Signed)
Typed and Screened today. Coming back Friday 12/01/15 for blood transfusion

## 2015-11-29 NOTE — Progress Notes (Signed)
Spoke with Nadeen LandauMichelle,RN at Dr. Feliz BeamSnider's office regarding Mr. [name] [name] getting his type and cross match today and his blood transfusion on this Friday. Marcelino DusterMichelle said that she will let Dr. Drue SecondSnider know. We will do as planned for his transfusion.

## 2015-11-29 NOTE — Telephone Encounter (Signed)
Not[facility] that the patient adjusted his appointments. Type and cross 6/7 at 3:00, transfusion 6/9 at 11:00.

## 2015-12-01 ENCOUNTER — Ambulatory Visit (HOSPITAL_COMMUNITY)
Admission: RE | Admit: 2015-12-01 | Discharge: 2015-12-01 | Disposition: A | Discharge status: Home / Self Care | Payer: Medicare Other | Source: Ambulatory Visit | Attending: Family Medicine | Admitting: Family Medicine

## 2015-12-01 DIAGNOSIS — T826XXA Infection and inflammatory reaction due to cardiac valve prosthesis, initial encounter: Secondary | ICD-10-CM | POA: Diagnosis not present

## 2015-12-01 LAB — PREPARE RBC (CROSSMATCH)

## 2015-12-01 MED ORDER — SODIUM CHLORIDE 0.9 % IV SOLN
Freq: Once | INTRAVENOUS | Status: AC
  Administered 2015-12-01: 11:00:00 via INTRAVENOUS

## 2015-12-01 MED ORDER — SODIUM CHLORIDE 0.9% FLUSH
10.0000 mL | INTRAVENOUS | Status: AC | PRN
  Administered 2015-12-01: 10 mL

## 2015-12-01 MED ORDER — HEPARIN SOD (PORK) LOCK FLUSH 100 UNIT/ML IV SOLN
250.0000 [IU] | INTRAVENOUS | Status: AC | PRN
  Administered 2015-12-01: 250 [IU]
  Filled 2015-12-01: qty 5

## 2015-12-01 NOTE — Progress Notes (Signed)
Provider: Judyann Munsonynthia Snider MD  Associated Diagnosis: Prosthetic Valve Endocarditis, Iron def. Anemia.  Procedure: Transfusion of 1 unit PRBC via PICC line in right upper arm.  Patient tolerated transfusion well. No reaction. PICC line flushed per protocol Went over discharge instructions and copy given to patient. He understands instructions. Alert, oriented and ambulatory at time of discharge. Discharged to home....wife present.

## 2015-12-01 NOTE — Discharge Instructions (Signed)

## 2015-12-04 LAB — TYPE AND SCREEN
ABO/RH(D): A POS
Antibody Screen: NEGATIVE
Unit division: 0

## 2015-12-05 ENCOUNTER — Encounter: Payer: Self-pay | Admitting: Cardiology

## 2015-12-05 ENCOUNTER — Ambulatory Visit: Payer: Medicare Other | Admitting: Cardiology

## 2015-12-05 ENCOUNTER — Ambulatory Visit (INDEPENDENT_AMBULATORY_CARE_PROVIDER_SITE_OTHER): Payer: Medicare Other | Admitting: Cardiology

## 2015-12-05 DIAGNOSIS — Z9889 Other specified postprocedural states: Secondary | ICD-10-CM | POA: Diagnosis not present

## 2015-12-05 DIAGNOSIS — G4733 Obstructive sleep apnea (adult) (pediatric): Secondary | ICD-10-CM

## 2015-12-05 DIAGNOSIS — D509 Iron deficiency anemia, unspecified: Secondary | ICD-10-CM

## 2015-12-05 DIAGNOSIS — Z7901 Long term (current) use of anticoagulants: Secondary | ICD-10-CM

## 2015-12-05 DIAGNOSIS — Z954 Presence of other heart-valve replacement: Secondary | ICD-10-CM | POA: Diagnosis not present

## 2015-12-05 DIAGNOSIS — Z0389 Encounter for observation for other suspected diseases and conditions ruled out: Secondary | ICD-10-CM

## 2015-12-05 DIAGNOSIS — D649 Anemia, unspecified: Secondary | ICD-10-CM

## 2015-12-05 DIAGNOSIS — I38 Endocarditis, valve unspecified: Secondary | ICD-10-CM

## 2015-12-05 DIAGNOSIS — E1159 Type 2 diabetes mellitus with other circulatory complications: Secondary | ICD-10-CM | POA: Diagnosis not present

## 2015-12-05 DIAGNOSIS — Z952 Presence of prosthetic heart valve: Secondary | ICD-10-CM

## 2015-12-05 NOTE — Assessment & Plan Note (Signed)
NPSG 2003:  AHI 14/hr HST 2012:  AHI 19/hr CPAP set to 14cm currently

## 2015-12-05 NOTE — Assessment & Plan Note (Signed)
[facility] pericardial tissue valve Increased gradients noted on echo may 2017 felt to be secondary to high output state from anemia

## 2015-12-05 NOTE — Assessment & Plan Note (Signed)
Mild coronary plaque.  w/ normal LV function. repeated cardiac cath on 07/2009

## 2015-12-05 NOTE — Progress Notes (Signed)
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 He is status post mitral valve and aortic valve replacement and MV repair with MV ring in 2012 with bioprosthetic valves. The pt is on Xarelto, has diabetes on oral medication, hypertension, and obesity.   He was admitted 11/03/15 with symptoms of heart failure and worsening murmur. He had hip surgery in Feb 2017 and then started feeling weak and loosing wgt around Easter. He tells me he lost "50 lbs". He was seen at [facility] and found to have significant murmur and was transfered to [facility].  He was found to have Strep bacteremia (over 5 sets of cultures ) often associated with poor dentition. He is being followed by Dr Ilsa Iha with ID. Surprisingly to his TEE was negative for vegetation. There was still have high suspicion for endocarditis given on going bacteremia, and the pt was treated with 6 weeks of ABs- May 14 th being day #1. He was also found to be anemic and had two units recently through dr Ilsa Iha. He does feel better after this. He apparently has had a negative GI work at [facility] in the past.    Current Outpatient Prescriptions  Medication Sig Dispense Refill  . acetaminophen (TYLENOL) 650 MG CR tablet Take 1,300 mg by mouth daily.    Marland Kitchen aspirin (ECOTRIN) 325 MG EC tablet Take 325 mg by mouth daily.    . cefTRIAXone (ROCEPHIN) 2 g SOLR injection Inject 2 g into the vein daily. For 6 weeks from 11/05/15 39 each 0  . Cholecalciferol (VITAMIN D3) 5000 units CAPS Take 1 capsule by mouth daily.    . diazepam (VALIUM) 5 MG tablet Take 5 mg by mouth at bedtime.   0  . Ferrous Sulfate-C-Folic Acid 105-500-0.8 MG TBCR Take 500 mg by mouth  daily.    Marland Kitchen FLUoxetine (PROZAC) 40 MG capsule Take 40 mg by mouth daily.    . furosemide (LASIX) 40 MG tablet Take 40 mg by mouth Daily.     Marland Kitchen HYDROcodone-acetaminophen (NORCO/VICODIN) 5-325 MG tablet take 1 tablet by mouth every 6 hours if needed for SEVERE PAIN  0  . levothyroxine (SYNTHROID, LEVOTHROID) 112 MCG tablet Take 112 mcg by mouth daily before breakfast.   1  . lisinopril (PRINIVIL,ZESTRIL) 10 MG tablet Take 10 mg by mouth daily.    . metFORMIN (GLUCOPHAGE-XR) 500 MG 24 hr tablet Take 500 mg by mouth 2 (two) times daily.  0  . metoprolol tartrate (LOPRESSOR) 25 MG tablet take 1 tablet by mouth once daily 90 tablet 3  . omeprazole (PRILOSEC) 20 MG capsule Take 20 mg by mouth at bedtime.     . rivaroxaban (XARELTO) 20 MG TABS tablet Take 1 tablet (20 mg total) by mouth daily. 30 tablet 11  . simvastatin (ZOCOR) 40 MG tablet take 1 tablet by mouth at bedtime 90 tablet 1  . [DISCONTINUED] ONGLYZA 5 MG TABS tablet Take 5 mg by mouth daily.      No current facility-administered medications for this visit.    Allergies  Allergen Reactions  . Invokana [Canagliflozin] Rash    Social History   Social History  . Marital Status: Married    Spouse Name: RITA  . Number  of Children: 1  . Years of Education: N/A   Occupational History  . disabled    Social History Main Topics  . Smoking status: Former Smoker -- 1.00 packs/day for 6 years    Types: Cigarettes    Quit date: 01/22/1974  . Smokeless tobacco: Never Used  . Alcohol Use: No  . Drug Use: No  . Sexual Activity: Not on file   Other Topics Concern  . Not on file   Social History Narrative     Review of Systems: General: negative for chills, fever, night sweats or weight changes.  Cardiovascular: negative for chest pain, dyspnea on exertion, edema, orthopnea, palpitations, paroxysmal nocturnal dyspnea or shortness of breath Dermatological: negative for rash Respiratory: negative for cough or wheezing Urologic:  negative for hematuria Abdominal: negative for nausea, vomiting, diarrhea, bright red blood per rectum, melena, or hematemesis Neurologic: negative for visual changes, syncope, or dizziness Generalized fatigue, anorexia All other systems reviewed and are otherwise negative except as noted above.    Blood pressure 118/82, pulse 82, height 5\' 11"  (1.803 m), weight 284 lb (128.822 kg).  General appearance: alert, cooperative, no distress and morbidly obese Neck: no carotid bruit and no JVD Lungs: clear to auscultation bilaterally Heart: regular rate and rhythm and 2/6 systolic murmur AOV and LSB Abdomen: obese Skin: pale, cool, dry Neurologic: Grossly normal Poor dentition   ASSESSMENT AND PLAN:   Endocarditis Admitted in May 2017 with positive blood cultures but negative TEE for vegetation. Treated with 6 weeks of antibiotics.   S/P AVR (aortic valve replacement) [facility] Ease pericardial tissue valve Increased gradients noted on echo may 2017 felt to be secondary to high output state from anemia   S/P mitral valve repair Autologous pericardial patch augmentation of posterior leaflet with 28mm Sorin Memo 3D ring annuloplasty Oct 2012  Type 2 diabetes mellitus with vascular disease (HCC) .  Obstructive sleep apnea NPSG 2003:  AHI 14/hr HST 2012:  AHI 19/hr CPAP set to 14cm currently  Morbid obesity (HCC) BMI 40  Coronary artery disease (CAD) excluded Mild coronary plaque.  w/ normal LV function. 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 F/U Dr Eden EmmsNishan in 6 weeks wiith an echo that day.  Consider dental consult for possible dental extraction secondary to endocarditis risk. Dr Ilsa IhaSnyder also suggested an oncology consult  for anemia and I encouraged him to follow through with that.   Corine ShelterLuke Malosi Hemstreet PA-C 12/05/2015 2:55 PM

## 2015-12-05 NOTE — Assessment & Plan Note (Signed)
Autologous pericardial patch augmentation of posterior leaflet with 28mm Sorin Memo 3D ring annuloplasty Oct 2012

## 2015-12-05 NOTE — Assessment & Plan Note (Signed)
Xarelto

## 2015-12-05 NOTE — Assessment & Plan Note (Signed)
recurrent DVT and PE, status post IVC filter On chronic anticoagulation

## 2015-12-05 NOTE — Assessment & Plan Note (Signed)
Admitted in May 2017 with positive blood cultures but negative TEE for vegetation. Treated with 6 weeks of antibiotics.

## 2015-12-05 NOTE — Assessment & Plan Note (Signed)
BMI 40 

## 2015-12-05 NOTE — Patient Instructions (Addendum)
Medication Instructions:   Your physician recommends that you continue on your current medications as directed. Please refer to the Current Medication list given to you today.   If you need a refill on your cardiac medications before your next appointment, please call your pharmacy.  Labwork:  NONE ORDER TODAY    Testing/Procedures:  NONE ORDER TODAY    Follow-Up: IN 6 TO 8 WEEKS WITH DR Eden EmmsNISHAN    Any Other Special Instructions Will Be Listed Below (If Applicable).

## 2015-12-05 NOTE — Assessment & Plan Note (Signed)
Treated with IV Iron in the past

## 2015-12-07 ENCOUNTER — Encounter: Payer: Self-pay | Admitting: Internal Medicine

## 2015-12-15 ENCOUNTER — Telehealth: Payer: Self-pay

## 2015-12-15 NOTE — Telephone Encounter (Signed)
Left message for patient to call back to schedule appointment.

## 2015-12-15 NOTE — Telephone Encounter (Signed)
-----   [facility] sent at 12/05/2015  2:52 PM EDT ----- Regarding: pt needs appt with Dr. Eden EmmsNishan Pt saw Franky MachoLuke today 12/05/2015 and he wants the pt to follow up with Dr. Eden EmmsNishan in 6 - 8 weeks, I don't see anything, can you please see what you can do and let the pt know since I will not be back in the office until 12/15/2015  thanks

## 2015-12-21 NOTE — Telephone Encounter (Signed)
Patient has appointment in August.

## 2015-12-23 ENCOUNTER — Other Ambulatory Visit: Payer: Self-pay | Admitting: Cardiovascular Disease

## 2016-01-01 ENCOUNTER — Ambulatory Visit (INDEPENDENT_AMBULATORY_CARE_PROVIDER_SITE_OTHER): Payer: Medicare Other | Admitting: Internal Medicine

## 2016-01-01 VITALS — BP 145/87 | HR 69 | Temp 97.6°F | Wt 284.0 lb

## 2016-01-01 DIAGNOSIS — I330 Acute and subacute infective endocarditis: Secondary | ICD-10-CM | POA: Diagnosis not present

## 2016-01-01 LAB — CBC WITH DIFFERENTIAL/PLATELET
BASOS PCT: 0 %
Basophils Absolute: 0 cells/uL (ref 0–200)
EOS ABS: 350 {cells}/uL (ref 15–500)
EOS PCT: 5 %
HCT: 32 % — ABNORMAL LOW (ref 38.5–50.0)
Hemoglobin: 9.8 g/dL — ABNORMAL LOW (ref 13.2–17.1)
Lymphocytes Relative: 18 %
Lymphs Abs: 1260 cells/uL (ref 850–3900)
MCH: 23.9 pg — ABNORMAL LOW (ref 27.0–33.0)
MCHC: 30.6 g/dL — ABNORMAL LOW (ref 32.0–36.0)
MCV: 78 fL — AB (ref 80.0–100.0)
MONOS PCT: 6 %
MPV: 9 fL (ref 7.5–12.5)
Monocytes Absolute: 420 cells/uL (ref 200–950)
NEUTROS ABS: 4970 {cells}/uL (ref 1500–7800)
Neutrophils Relative %: 71 %
PLATELETS: 188 10*3/uL (ref 140–400)
RBC: 4.1 MIL/uL — ABNORMAL LOW (ref 4.20–5.80)
RDW: 18.1 % — ABNORMAL HIGH (ref 11.0–15.0)
WBC: 7 10*3/uL (ref 3.8–10.8)

## 2016-01-01 NOTE — Progress Notes (Signed)
RFV: PVE  Subjective:    Patient ID: [name] [name], male    DOB: 11/28/1950, 65 y.o.   MRN: [id number]  HPI [name] [name] is a 65yo M with history of Grade 2 diastolic heart failure, preserved LVEF, mild CAD, and  Hx of mitral valve and aortic valve replacement. In mid may, he was admitted for symptoms of heart failure and worsening murmur. He was found to have nutritional deficient streptococcal bacteremia  in numerous subsequent bottles. Surprisingly to his TEE was negative for vegetation. There was still have high suspicion for endocarditis given on going bacteremia, and the pt was treated with 6 weeks of ceftriaxone- May 14 th being day #1 through end of June. During this period, he had episodic low hemoglobin and given 1 unit RBC tsf x 2, in addition to IV iron infusion given through his pcp. He does feel better after this. He apparently has had a negative GI work at [facility] in the past. He has followed up with cardiology in mid June but will need follow echo, which is scheduled next week. He states that his appetite is improving. He tolerated abtx without difficulty, did have inguinal candidal rash and at one time had contact dermatitis to dressing of picc line. He tolerated picc line removal wihtout difficulty.  He states that he had a cortisone injection to his right knee roughly 3 wks ago which has greatly improved his symptoms of right knee pain.  Allergies  Allergen Reactions  . Invokana [Canagliflozin] Rash   Current Outpatient Prescriptions on File Prior to Visit  Medication Sig Dispense Refill  . acetaminophen (TYLENOL) 650 MG CR tablet Take 1,300 mg by mouth daily as needed for pain.     Marland Kitchen aspirin (ECOTRIN) 325 MG EC tablet Take 325 mg by mouth daily.    . Cholecalciferol (VITAMIN D3) 5000 units CAPS Take 1 capsule by mouth daily.    . diazepam (VALIUM) 5 MG tablet Take 5 mg by mouth at bedtime.   0  . Ferrous Sulfate-C-Folic Acid 105-500-0.8 MG TBCR Take 500 mg by mouth daily.    Marland Kitchen  FLUoxetine (PROZAC) 40 MG capsule Take 40 mg by mouth daily.    Marland Kitchen HYDROcodone-acetaminophen (NORCO/VICODIN) 5-325 MG tablet take 1 tablet by mouth every 6 hours if needed for SEVERE PAIN  0  . levothyroxine (SYNTHROID, LEVOTHROID) 112 MCG tablet Take 112 mcg by mouth daily before breakfast.   1  . lisinopril (PRINIVIL,ZESTRIL) 10 MG tablet Take 10 mg by mouth daily.    . metFORMIN (GLUCOPHAGE-XR) 500 MG 24 hr tablet Take 500 mg by mouth 2 (two) times daily.  0  . metoprolol tartrate (LOPRESSOR) 25 MG tablet take 1 tablet by mouth once daily (Patient taking differently: take 1 tablet by mouth once daily in the evening) 90 tablet 3  . omeprazole (PRILOSEC) 20 MG capsule Take 20 mg by mouth at bedtime.     . rivaroxaban (XARELTO) 20 MG TABS tablet Take 1 tablet (20 mg total) by mouth daily. (Patient taking differently: Take 20 mg by mouth daily with supper. ) 30 tablet 11  . [DISCONTINUED] ONGLYZA 5 MG TABS tablet Take 5 mg by mouth daily.      No current facility-administered medications on file prior to visit.    Active Ambulatory Problems    Diagnosis Date Noted  . Morbid obesity (HCC) 02/20/2009  . DEPRESSION 02/20/2009  . Obstructive sleep apnea 02/20/2009  . Essential hypertension 02/20/2009  . SKIN LESION 02/20/2009  .  TRAUMATIC COMPARTMENT SYNDROME LOWER EXTREMITY 02/20/2009  . Dyspnea 11/15/2010  . Aortic insufficiency   . Mitral regurgitation 03/29/2011  . Extrasystole 04/30/2011  . S/P AVR (aortic valve replacement) 04/10/2011  . S/P mitral valve repair 04/10/2011  . History of DVT (deep vein thrombosis)   . History of endocarditis   . Coronary artery disease (CAD) excluded   . RLS (restless legs syndrome) 07/11/2011  . Varicose vein 01/24/2012  . 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 Uncontrolled type 2 diabetes mellitus (HCC) 04/28/2015  . Primary hypothyroidism 04/28/2015  . Hyperlipidemia 04/28/2015  . Osteoarthritis of right hip 08/10/2015    . 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 Seasonal and perennial allergic rhinitis 10/13/2015  . Unexplained weight loss 11/02/2015  . Fatigue 11/02/2015  . Chills 11/02/2015  . Elevated troponin I level 11/03/2015  . Absolute anemia 11/03/2015  . Streptococcal bacteremia 11/04/2015  . Unintentional weight loss 11/04/2015  . Endocarditis   . Poor dentition   . Chronic anticoagulation 12/05/2015  . Chronic anemia 12/05/2015   Resolved Ambulatory Problems    Diagnosis Date Noted  . CORONARY ARTERY DISEASE 02/20/2009  . Endocarditis 03/20/2010  . DVT 02/20/2009  . POSTPHLEBITIC SYNDROME 02/20/2009  . Shortness of breath 07/21/2009  . CHEST PAIN-UNSPECIFIED 02/20/2009  . Endocarditis   . MS (mitral stenosis)   . DVT (deep vein thrombosis) in pregnancy   . Seroma 07/07/2011  . S/P MVR (mitral valve repair) 08/05/2011  . Essential hypertension, benign 04/28/2015  . Positive blood culture 11/03/2015   Past Medical History:  Diagnosis Date  . Aortic insufficiency 11/2010  . Arthritis   . CHF (congestive heart failure) (HCC)   . Coronary artery disease (CAD) excluded 2008, 2011  . Deep venous thrombosis (HCC)   . Depression   . Diabetes mellitus   . Dyspnea   . GERD (gastroesophageal reflux disease)   . Heart murmur   . History of endocarditis   . Hyperlipidemia   . Hypertension   . Mitral regurgitation   . Obesity   . OSA (obstructive sleep apnea)   . Postphlebitic syndrome   . Restless leg syndrome      Review of Systems 10 point ros is negative    Objective:   Physical Exam BP (!) 145/87   Pulse 69   Temp 97.6 F (36.4 C) (Oral)   Wt 284 lb (128.8 kg)   BMI 39.61 kg/m  Physical Exam  Constitutional: He is oriented to person, place, and time. He appears well-developed and well-nourished. No distress.  HENT:  Mouth/Throat: Oropharynx is clear and moist. No oropharyngeal exudate.  Cardiovascular:  Normal rate, regular rhythm and normal heart sounds. Exam reveals no gallop and no friction rub.  No murmur heard.  Pulmonary/Chest: Effort normal and breath sounds normal. No respiratory distress. He has no wheezes.  Abdominal: Soft. Bowel sounds are normal. He exhibits no distension. There is no tenderness.  Lymphadenopathy:  He has no cervical adenopathy.  Neurological: He is alert and oriented to person, place, and time.  Skin: Skin is warm and dry. No rash noted. No erythema.  Psychiatric: He has a normal mood and affect. His behavior is normal.        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 No further need for antibiotics at this time  If he has fevers, chills, nightsweats - recommend to  follow up with pcp to ensure no furhter infections

## 2016-01-04 ENCOUNTER — Other Ambulatory Visit: Payer: Self-pay

## 2016-01-04 ENCOUNTER — Ambulatory Visit (HOSPITAL_COMMUNITY): Payer: Medicare Other | Attending: Internal Medicine

## 2016-01-04 DIAGNOSIS — I38 Endocarditis, valve unspecified: Secondary | ICD-10-CM

## 2016-01-04 DIAGNOSIS — E785 Hyperlipidemia, unspecified: Secondary | ICD-10-CM | POA: Insufficient documentation

## 2016-01-04 DIAGNOSIS — I110 Hypertensive heart disease with heart failure: Secondary | ICD-10-CM | POA: Insufficient documentation

## 2016-01-04 DIAGNOSIS — I358 Other nonrheumatic aortic valve disorders: Secondary | ICD-10-CM | POA: Insufficient documentation

## 2016-01-04 DIAGNOSIS — E119 Type 2 diabetes mellitus without complications: Secondary | ICD-10-CM | POA: Insufficient documentation

## 2016-01-04 DIAGNOSIS — Z6839 Body mass index (BMI) 39.0-39.9, adult: Secondary | ICD-10-CM | POA: Insufficient documentation

## 2016-01-04 DIAGNOSIS — I059 Rheumatic mitral valve disease, unspecified: Secondary | ICD-10-CM | POA: Insufficient documentation

## 2016-01-04 DIAGNOSIS — G4733 Obstructive sleep apnea (adult) (pediatric): Secondary | ICD-10-CM | POA: Insufficient documentation

## 2016-01-04 DIAGNOSIS — Z87891 Personal history of nicotine dependence: Secondary | ICD-10-CM | POA: Diagnosis not present

## 2016-01-04 DIAGNOSIS — I2510 Atherosclerotic heart disease of native coronary artery without angina pectoris: Secondary | ICD-10-CM | POA: Diagnosis not present

## 2016-01-04 DIAGNOSIS — E669 Obesity, unspecified: Secondary | ICD-10-CM | POA: Insufficient documentation

## 2016-01-04 DIAGNOSIS — Z8249 Family history of ischemic heart disease and other diseases of the circulatory system: Secondary | ICD-10-CM | POA: Diagnosis not present

## 2016-01-04 DIAGNOSIS — I509 Heart failure, unspecified: Secondary | ICD-10-CM | POA: Insufficient documentation

## 2016-01-24 ENCOUNTER — Encounter: Payer: Self-pay | Admitting: Cardiovascular Disease

## 2016-01-25 ENCOUNTER — Other Ambulatory Visit: Payer: Self-pay | Admitting: Cardiovascular Disease

## 2016-01-31 ENCOUNTER — Telehealth: Payer: Self-pay | Admitting: Cardiovascular Disease

## 2016-01-31 ENCOUNTER — Ambulatory Visit: Payer: Medicare Other | Admitting: Cardiovascular Disease

## 2016-01-31 NOTE — Telephone Encounter (Signed)
New message    Pt verbalized that he has a stomach bug and he has to cancel his appt for today, he wants the rn to call him with the Echo results

## 2016-01-31 NOTE — Progress Notes (Deleted)
01/31/2016 [name] [name]   03-28-1951  [id number]  Primary Physician Louie Boston, MD Primary Cardiologist: Dr Eden Emms  HPI:  65 y.o. 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 He is status post mitral valve and aortic valve replacement and MV repair with MV ring in 2012 with bioprosthetic valves. The pt is on Xarelto, has diabetes on oral medication, hypertension, and obesity.   He was admitted 11/03/15 with symptoms of heart failure and worsening murmur. He had hip surgery in Feb 2017 and then started feeling weak and loosing wgt around Easter. He tells me he lost "50 lbs". He was seen at [facility] and found to have significant murmur and was transfered to [facility].  He was found to have Strep bacteremia (over 5 sets of cultures ) often associated with poor dentition. He is being followed by Dr Ilsa Iha with ID. Surprisingly to his TEE was negative for vegetation. There was still have high suspicion for endocarditis given on going bacteremia, and the pt was treated with 6 weeks of ABs- May 14 th being day #1. He was also found to be anemic and had two units recently through dr Ilsa Iha. He does feel better after this. He apparently has had a negative GI work at [facility] in the past.    Current Outpatient Prescriptions  Medication Sig Dispense Refill  . acetaminophen (TYLENOL) 650 MG CR tablet Take 1,300 mg by mouth daily.    Marland Kitchen amitriptyline (ELAVIL) 50 MG tablet Take 50 mg by mouth at bedtime.  0  . aspirin (ECOTRIN) 325 MG EC tablet Take 325 mg by mouth daily.    . Cholecalciferol (VITAMIN D3) 5000 units CAPS Take 1 capsule by mouth daily.    . diazepam (VALIUM) 5 MG tablet Take 5 mg by mouth at bedtime.   0  . Ferrous Sulfate-C-Folic Acid 105-500-0.8 MG TBCR Take 500 mg by mouth daily.    Marland Kitchen FLUoxetine (PROZAC) 40 MG  capsule Take 40 mg by mouth daily.    . furosemide (LASIX) 40 MG tablet Take 40 mg by mouth Daily.     Marland Kitchen HYDROcodone-acetaminophen (NORCO/VICODIN) 5-325 MG tablet take 1 tablet by mouth every 6 hours if needed for SEVERE PAIN  0  . levothyroxine (SYNTHROID, LEVOTHROID) 112 MCG tablet Take 112 mcg by mouth daily before breakfast.   1  . lisinopril (PRINIVIL,ZESTRIL) 10 MG tablet Take 10 mg by mouth daily.    . metFORMIN (GLUCOPHAGE-XR) 500 MG 24 hr tablet Take 500 mg by mouth 2 (two) times daily.  0  . metoprolol tartrate (LOPRESSOR) 25 MG tablet take 1 tablet by mouth once daily 90 tablet 3  . omeprazole (PRILOSEC) 20 MG capsule Take 20 mg by mouth at bedtime.     . rivaroxaban (XARELTO) 20 MG TABS tablet Take 1 tablet (20 mg total) by mouth daily. 30 tablet 11  . simvastatin (ZOCOR) 40 MG tablet take 1 tablet by mouth at bedtime 90 tablet 3   No current facility-administered medications for this visit.     Allergies  Allergen Reactions  . Invokana [Canagliflozin] Rash    Social History   Social History  . Marital status: Married    Spouse name: RITA  . Number of children: 1  . Years of education: N/A   Occupational History  . disabled    Social History Main  Topics  . Smoking status: Former Smoker    Packs/day: 1.00    Years: 6.00    Types: Cigarettes    Quit date: 01/22/1974  . Smokeless tobacco: Never Used  . Alcohol use No  . Drug use: No  . Sexual activity: Not on file   Other Topics Concern  . Not on file   Social History Narrative  . No narrative on file     Review of Systems: General: negative for chills, fever, night sweats or weight changes.  Cardiovascular: negative for chest pain, dyspnea on exertion, edema, orthopnea, palpitations, paroxysmal nocturnal dyspnea or shortness of breath Dermatological: negative for rash Respiratory: negative for cough or wheezing Urologic: negative for hematuria Abdominal: negative for nausea, vomiting, diarrhea, bright red  blood per rectum, melena, or hematemesis Neurologic: negative for visual changes, syncope, or dizziness Generalized fatigue, anorexia All other systems reviewed and are otherwise negative except as noted above.    There were no vitals taken for this visit.  General appearance: alert, cooperative, no distress and morbidly obese Neck: no carotid bruit and no JVD Lungs: clear to auscultation bilaterally Heart: regular rate and rhythm and 2/6 systolic murmur AOV and LSB Abdomen: obese Skin: pale, cool, dry Neurologic: Grossly normal Poor dentition   ASSESSMENT AND PLAN:   No problem-specific Assessment & Plan notes found for this encounter.   PLAN     Valve Dx:  Post tissue AVR and MV repair TEE 10/2015 normal function with elevated gradient due to high CO and no SBE or vegetations.   Bacteremia:  Post 6 weeks antibiotic Rx See ID notes Dental  DM:  Discussed low carb diet.  Target hemoglobin A1c is 6.5 or less.  Continue current medications.  Chol:   Cholesterol is at goal.  Continue current dose of statin and diet Rx.  No myalgias or side effects.  F/U  LFT's in 6 months. Lab Results  Component Value Date   LDLCALC 59 08/13/2013           Thyroid:   Lab Results  Component Value Date   TSH 1.630 ***Test methodology is 3rd generation TSH*** 02/21/2009

## 2016-01-31 NOTE — Telephone Encounter (Signed)
Left message for patient to call back  

## 2016-02-19 ENCOUNTER — Telehealth: Payer: Self-pay | Admitting: *Deleted

## 2016-02-19 NOTE — Telephone Encounter (Signed)
Patient called and advised that about 10 days ago he started to feel very weak and low energy. He called his PCP and they gave him Iron. He advised that made him feel worse and he wants to fax us his last lab results and ask that the doctor look at them and if at all possible see him this week. He advised he feels that bad and she was the only one who listened to him last time. Advised the patient to fax the labs and I will give them to the doctor to look at. Advised not sure if she can see him this week but will ask. He advised will fax the labs and wait for my call either way. Advised him in the mean time to call his PCP and let them know the Iron did not help.

## 2016-02-20 ENCOUNTER — Telehealth: Payer: Self-pay | Admitting: Cardiovascular Disease

## 2016-02-20 NOTE — Telephone Encounter (Signed)
New Message  Pt c/o Shortness Of Breath: STAT if SOB developed within the last 24 hours or pt is noticeably SOB on the phone  1. Are you currently SOB (can you hear that pt is SOB on the phone)? YES  2. How long have you been experiencing SOB? 3 days  3. Are you SOB when sitting or when up moving around? Moving around pt states he just wants to lay down all the time   4. Are you currently experiencing any other symptoms? dizziness

## 2016-02-20 NOTE — Telephone Encounter (Addendum)
Asking about response to faxed lab work.  Now, short of breath while talking on the telephone.  Advised patient that Dr. Drue SecondSnider has the faxed lab work and is reviewing.  Due to the shortness of breath while talking on the phone the patient was advised to either call his cardiologist office and/or go the the closest emergency room for evaluation.  Patient verbalized understanding.  He stated that he would call his cardiologist office this afternoon.

## 2016-02-20 NOTE — Telephone Encounter (Signed)
Should go to ER or be seen by PCP/ID Not primarily a cardiac issue needs Blood cultures And evaluation for infection Can schedule him for echo this week if he does Not go

## 2016-02-20 NOTE — Telephone Encounter (Addendum)
RN shared conversation with the patient with Dr. Judyann Munsonynthia Snider.  Dr. Drue SecondSnider added that the patient's lab report had been reviewed.  RN shared that patient was advised to call his cardiologist today or go to the nearest emergency room for evaluation.  Dr. Drue SecondSnider agreed that this was an appropriate plan for this patient.

## 2016-02-20 NOTE — Telephone Encounter (Signed)
Call transferred into triage for SOB.  Pt reports that for 2 weeks he has felt poorly, no energy, no appetite. For the last 3 days he has been SOB, with any exertion like walking from one room to another. Gets lightheaded when standing.  He had 1 fever 4 days ago, 101 degrees, tylenol helped this.  Has chills intermittently. Reports 8 # weight loss recently.  Was hospitalized earlier this year and treated for endocarditis.    Had iron infusion last Friday by PCP, states it gave him nausea and diarrhea. Called ID today and was instructed to go to ED and/or call cardiology.  I have advised patient to call his PCP back tomorrow to be seen urgently.  If they cannot see him tomorrow, then he should go to the ED for eval.  Pt states he thinks he may go to the [facility] ED since it is close and he wont have to wait too long.   He feels he may be too weak to sit and wait a long time to be seen.  Advised patient to increase his fluid intake, he has had diarrhea and poor po intake and is taking Lasix 40 mg daily.

## 2016-02-21 ENCOUNTER — Emergency Department (HOSPITAL_COMMUNITY): Payer: Medicare Other

## 2016-02-21 ENCOUNTER — Inpatient Hospital Stay (HOSPITAL_COMMUNITY)
Admission: EM | Admit: 2016-02-21 | Discharge: 2016-02-26 | DRG: 811 | Disposition: A | Discharge status: Home Health | Payer: Medicare Other | Attending: Internal Medicine | Admitting: Internal Medicine

## 2016-02-21 ENCOUNTER — Encounter (HOSPITAL_COMMUNITY): Payer: Self-pay

## 2016-02-21 DIAGNOSIS — Z823 Family history of stroke: Secondary | ICD-10-CM

## 2016-02-21 DIAGNOSIS — Z8249 Family history of ischemic heart disease and other diseases of the circulatory system: Secondary | ICD-10-CM

## 2016-02-21 DIAGNOSIS — R7989 Other specified abnormal findings of blood chemistry: Secondary | ICD-10-CM | POA: Diagnosis not present

## 2016-02-21 DIAGNOSIS — Z86718 Personal history of other venous thrombosis and embolism: Secondary | ICD-10-CM

## 2016-02-21 DIAGNOSIS — R7881 Bacteremia: Secondary | ICD-10-CM | POA: Diagnosis present

## 2016-02-21 DIAGNOSIS — R0600 Dyspnea, unspecified: Secondary | ICD-10-CM | POA: Diagnosis not present

## 2016-02-21 DIAGNOSIS — Z86711 Personal history of pulmonary embolism: Secondary | ICD-10-CM

## 2016-02-21 DIAGNOSIS — E039 Hypothyroidism, unspecified: Secondary | ICD-10-CM | POA: Diagnosis present

## 2016-02-21 DIAGNOSIS — Z7901 Long term (current) use of anticoagulants: Secondary | ICD-10-CM

## 2016-02-21 DIAGNOSIS — D508 Other iron deficiency anemias: Principal | ICD-10-CM | POA: Diagnosis present

## 2016-02-21 DIAGNOSIS — E119 Type 2 diabetes mellitus without complications: Secondary | ICD-10-CM | POA: Diagnosis present

## 2016-02-21 DIAGNOSIS — K029 Dental caries, unspecified: Secondary | ICD-10-CM | POA: Diagnosis present

## 2016-02-21 DIAGNOSIS — I2510 Atherosclerotic heart disease of native coronary artery without angina pectoris: Secondary | ICD-10-CM | POA: Diagnosis present

## 2016-02-21 DIAGNOSIS — I110 Hypertensive heart disease with heart failure: Secondary | ICD-10-CM | POA: Diagnosis present

## 2016-02-21 DIAGNOSIS — R748 Abnormal levels of other serum enzymes: Secondary | ICD-10-CM | POA: Diagnosis present

## 2016-02-21 DIAGNOSIS — Z809 Family history of malignant neoplasm, unspecified: Secondary | ICD-10-CM

## 2016-02-21 DIAGNOSIS — G2581 Restless legs syndrome: Secondary | ICD-10-CM | POA: Diagnosis present

## 2016-02-21 DIAGNOSIS — Z833 Family history of diabetes mellitus: Secondary | ICD-10-CM

## 2016-02-21 DIAGNOSIS — R531 Weakness: Secondary | ICD-10-CM

## 2016-02-21 DIAGNOSIS — Z952 Presence of prosthetic heart valve: Secondary | ICD-10-CM

## 2016-02-21 DIAGNOSIS — G4733 Obstructive sleep apnea (adult) (pediatric): Secondary | ICD-10-CM | POA: Diagnosis present

## 2016-02-21 DIAGNOSIS — Z841 Family history of disorders of kidney and ureter: Secondary | ICD-10-CM

## 2016-02-21 DIAGNOSIS — K219 Gastro-esophageal reflux disease without esophagitis: Secondary | ICD-10-CM | POA: Diagnosis present

## 2016-02-21 DIAGNOSIS — D649 Anemia, unspecified: Secondary | ICD-10-CM | POA: Diagnosis present

## 2016-02-21 DIAGNOSIS — I5033 Acute on chronic diastolic (congestive) heart failure: Secondary | ICD-10-CM | POA: Diagnosis present

## 2016-02-21 DIAGNOSIS — E785 Hyperlipidemia, unspecified: Secondary | ICD-10-CM | POA: Diagnosis present

## 2016-02-21 DIAGNOSIS — Z87891 Personal history of nicotine dependence: Secondary | ICD-10-CM

## 2016-02-21 DIAGNOSIS — R319 Hematuria, unspecified: Secondary | ICD-10-CM

## 2016-02-21 DIAGNOSIS — R0602 Shortness of breath: Secondary | ICD-10-CM

## 2016-02-21 DIAGNOSIS — I38 Endocarditis, valve unspecified: Secondary | ICD-10-CM | POA: Diagnosis present

## 2016-02-21 DIAGNOSIS — R778 Other specified abnormalities of plasma proteins: Secondary | ICD-10-CM | POA: Diagnosis present

## 2016-02-21 DIAGNOSIS — Z96642 Presence of left artificial hip joint: Secondary | ICD-10-CM | POA: Diagnosis present

## 2016-02-21 DIAGNOSIS — E1159 Type 2 diabetes mellitus with other circulatory complications: Secondary | ICD-10-CM | POA: Diagnosis present

## 2016-02-21 LAB — BASIC METABOLIC PANEL
Anion gap: 7 (ref 5–15)
BUN: 17 mg/dL (ref 6–20)
CO2: 25 mmol/L (ref 22–32)
Calcium: 8.6 mg/dL — ABNORMAL LOW (ref 8.9–10.3)
Chloride: 101 mmol/L (ref 101–111)
Creatinine, Ser: 1.04 mg/dL (ref 0.61–1.24)
GFR calc Af Amer: >60 mL/min (ref >60)
GFR calc non Af Amer: >60 mL/min (ref >60)
Glucose, Bld: 151 mg/dL — ABNORMAL HIGH (ref 65–99)
Potassium: 3.5 mmol/L (ref 3.5–5.1)
Sodium: 133 mmol/L — ABNORMAL LOW (ref 135–145)

## 2016-02-21 LAB — CBC WITH DIFFERENTIAL/PLATELET
Basophils Absolute: 0 10*3/uL (ref 0.0–0.1)
Basophils Relative: 0 %
Eosinophils Absolute: 0.6 10*3/uL (ref 0.0–0.7)
Eosinophils Relative: 8 %
HCT: 28.6 % — ABNORMAL LOW (ref 39.0–52.0)
Hemoglobin: 8.5 g/dL — ABNORMAL LOW (ref 13.0–17.0)
Lymphocytes Relative: 15 %
Lymphs Abs: 1.1 10*3/uL (ref 0.7–4.0)
MCH: 23.5 pg — ABNORMAL LOW (ref 26.0–34.0)
MCHC: 29.7 g/dL — ABNORMAL LOW (ref 30.0–36.0)
MCV: 79.2 fL (ref 78.0–100.0)
Monocytes Absolute: 0.2 10*3/uL (ref 0.1–1.0)
Monocytes Relative: 3 %
Neutro Abs: 5.1 10*3/uL (ref 1.7–7.7)
Neutrophils Relative %: 74 %
Platelets: 164 10*3/uL (ref 150–400)
RBC: 3.61 MIL/uL — ABNORMAL LOW (ref 4.22–5.81)
RDW: 20.4 % — ABNORMAL HIGH (ref 11.5–15.5)
WBC: 7 10*3/uL (ref 4.0–10.5)

## 2016-02-21 LAB — URINALYSIS, ROUTINE W REFLEX MICROSCOPIC
Bilirubin Urine: NEGATIVE
Glucose, UA: NEGATIVE mg/dL
Ketones, ur: NEGATIVE mg/dL
Leukocytes, UA: NEGATIVE
Nitrite: NEGATIVE
Protein, ur: 100 mg/dL — AB
Specific Gravity, Urine: >1.03 — ABNORMAL HIGH (ref 1.005–1.030)
pH: 5.5 (ref 5.0–8.0)

## 2016-02-21 LAB — SEDIMENTATION RATE: Sed Rate: 70 mm/hr — ABNORMAL HIGH (ref 0–16)

## 2016-02-21 LAB — TSH: TSH: 1.302 u[IU]/mL (ref 0.350–4.500)

## 2016-02-21 LAB — MAGNESIUM: Magnesium: 1.7 mg/dL (ref 1.7–2.4)

## 2016-02-21 LAB — URINE MICROSCOPIC-ADD ON

## 2016-02-21 LAB — BRAIN NATRIURETIC PEPTIDE: B NATRIURETIC PEPTIDE 5: 443 pg/mL — AB (ref 0.0–100.0)

## 2016-02-21 LAB — TROPONIN I: Troponin I: 0.05 ng/mL (ref <0.03)

## 2016-02-21 MED ORDER — FERROUS SULFATE-C-FA ER 105-500-0.8 MG PO TBCR: 500.0000 mg | EXTENDED_RELEASE_TABLET | Freq: Every day | ORAL | Status: DC

## 2016-02-21 MED ORDER — PANTOPRAZOLE SODIUM 40 MG PO TBEC
40.0000 mg | DELAYED_RELEASE_TABLET | Freq: Every day | ORAL | Status: DC
  Administered 2016-02-22 – 2016-02-26 (×5): 40 mg via ORAL
  Filled 2016-02-21 (×6): qty 1

## 2016-02-21 MED ORDER — LISINOPRIL 10 MG PO TABS
10.0000 mg | ORAL_TABLET | Freq: Every day | ORAL | Status: DC
  Administered 2016-02-22 – 2016-02-26 (×5): 10 mg via ORAL
  Filled 2016-02-21 (×5): qty 1

## 2016-02-21 MED ORDER — DIAZEPAM 5 MG PO TABS
5.0000 mg | ORAL_TABLET | Freq: Every day | ORAL | Status: DC
  Administered 2016-02-22: 5 mg via ORAL
  Filled 2016-02-21 (×3): qty 1

## 2016-02-21 MED ORDER — FUROSEMIDE 10 MG/ML IJ SOLN
20.0000 mg | Freq: Once | INTRAMUSCULAR | Status: AC
  Administered 2016-02-22: 20 mg via INTRAVENOUS
  Filled 2016-02-21: qty 2

## 2016-02-21 MED ORDER — ACETAMINOPHEN 325 MG PO TABS: 650.0000 mg | ORAL_TABLET | Freq: Four times a day (QID) | ORAL | Status: DC | PRN

## 2016-02-21 MED ORDER — VITAMIN D3 25 MCG (1000 UNIT) PO TABS
5000.0000 [IU] | ORAL_TABLET | Freq: Every day | ORAL | Status: DC
  Administered 2016-02-22 – 2016-02-26 (×5): 5000 [IU] via ORAL
  Filled 2016-02-21 (×12): qty 5

## 2016-02-21 MED ORDER — SODIUM CHLORIDE 0.9% FLUSH
3.0000 mL | Freq: Two times a day (BID) | INTRAVENOUS | Status: DC
  Administered 2016-02-22 – 2016-02-26 (×8): 3 mL via INTRAVENOUS

## 2016-02-21 MED ORDER — SODIUM CHLORIDE 0.9 % IV SOLN: 250.0000 mL | INTRAVENOUS | Status: DC | PRN

## 2016-02-21 MED ORDER — SODIUM CHLORIDE 0.9 % IV SOLN: INTRAVENOUS | Status: DC

## 2016-02-21 MED ORDER — ACETAMINOPHEN 650 MG RE SUPP: 650.0000 mg | Freq: Four times a day (QID) | RECTAL | Status: DC | PRN

## 2016-02-21 MED ORDER — AMITRIPTYLINE HCL 25 MG PO TABS
50.0000 mg | ORAL_TABLET | Freq: Every day | ORAL | Status: DC
  Administered 2016-02-22 – 2016-02-25 (×5): 50 mg via ORAL
  Filled 2016-02-21 (×5): qty 2

## 2016-02-21 MED ORDER — HYDROCODONE-ACETAMINOPHEN 5-325 MG PO TABS: 1.0000 | ORAL_TABLET | Freq: Four times a day (QID) | ORAL | Status: DC | PRN

## 2016-02-21 MED ORDER — METOPROLOL TARTRATE 25 MG PO TABS
25.0000 mg | ORAL_TABLET | Freq: Every evening | ORAL | Status: DC
  Administered 2016-02-22 – 2016-02-25 (×4): 25 mg via ORAL
  Filled 2016-02-21 (×4): qty 1

## 2016-02-21 MED ORDER — SODIUM CHLORIDE 0.9% FLUSH: 3.0000 mL | INTRAVENOUS | Status: DC | PRN

## 2016-02-21 MED ORDER — ASPIRIN EC 325 MG PO TBEC
325.0000 mg | DELAYED_RELEASE_TABLET | Freq: Every day | ORAL | Status: DC
  Administered 2016-02-22 – 2016-02-26 (×5): 325 mg via ORAL
  Filled 2016-02-21 (×5): qty 1

## 2016-02-21 MED ORDER — METFORMIN HCL ER 500 MG PO TB24
500.0000 mg | ORAL_TABLET | Freq: Two times a day (BID) | ORAL | Status: DC
  Administered 2016-02-22 – 2016-02-26 (×8): 500 mg via ORAL
  Filled 2016-02-21 (×8): qty 1

## 2016-02-21 MED ORDER — LEVOTHYROXINE SODIUM 112 MCG PO TABS
112.0000 ug | ORAL_TABLET | Freq: Every day | ORAL | Status: DC
  Administered 2016-02-22 – 2016-02-26 (×5): 112 ug via ORAL
  Filled 2016-02-21 (×5): qty 1

## 2016-02-21 MED ORDER — RIVAROXABAN 20 MG PO TABS
20.0000 mg | ORAL_TABLET | Freq: Every day | ORAL | Status: DC
  Administered 2016-02-22 – 2016-02-25 (×4): 20 mg via ORAL
  Filled 2016-02-21 (×4): qty 1

## 2016-02-21 MED ORDER — SIMVASTATIN 20 MG PO TABS
40.0000 mg | ORAL_TABLET | Freq: Every day | ORAL | Status: DC
  Administered 2016-02-22 – 2016-02-25 (×5): 40 mg via ORAL
  Filled 2016-02-21 (×5): qty 2

## 2016-02-21 MED ORDER — FLUOXETINE HCL 20 MG PO CAPS
40.0000 mg | ORAL_CAPSULE | Freq: Every day | ORAL | Status: DC
  Administered 2016-02-22 – 2016-02-26 (×5): 40 mg via ORAL
  Filled 2016-02-21 (×5): qty 2

## 2016-02-21 NOTE — H&P (Signed)
TRH H&P   Patient Demographics:    [name] [name], is a 65 y.o. male  MRN: [id number]   DOB - June 24, 1951  Admit Date - 02/21/2016  Outpatient Primary MD for the patient is Deloria Lair, MD  Referring MD/NP/PA: Mila Merry  Outpatient Specialists:   Jenkins Rouge (cardiologist),  Dr. Wilder Glade (ID)  Patient coming from:    home  Chief Complaint  Patient presents with  . Fatigue  . Shortness of Breath      HPI:    [name] [name]  is a 65 y.o. male, hypertension, hyperlipidemia, CAD, CHF (55-60%) co fatigue over the past 3 weeks,  And dyspnea over the past 1 week.  Subjective fever.  Pt thinks that his iron infusion might have had something to do with it. + generalized weakness, and loss of appetite.   Pt denies cough, cp, palp.    Review of systems:    In addition to the HPI above,  No Fever-chills, No Headache, No changes with Vision or hearing, No problems swallowing food or Liquids,  No Abdominal pain, No Nausea or Vommitting, Bowel movements are regular, No Blood in stool or Urine, No dysuria, No new skin rashes or bruises, No new joints pains-aches,  No new , tingling, numbness in any extremity, No recent weight gain or loss, No polyuria, polydypsia or polyphagia, No significant Mental Stressors.  A full 10 point Review of Systems was done, except as stated above, all other Review of Systems were negative.   With Past History of the following :    Past Medical History:  Diagnosis Date  . 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 Arthritis   . CHF (congestive heart failure) ([place])   . Coronary artery disease (CAD) excluded 2008, 2011   Mild coronary plaque.  w/ normal LV function. repeated cardiac cath on 07/2009  . Deep venous thrombosis (HCC)    recurrent DVT and PE, status post IVC filter  . Depression   .  Diabetes mellitus   . Dyspnea    with exertion  . GERD (gastroesophageal reflux disease)   . Heart murmur   . History of endocarditis    status post Enterococcus faecalis, secondary to chronic injections with low molecular weight heparin  . Hyperlipidemia   . Hypertension   . 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 Obesity   . OSA (obstructive sleep apnea)    CPAP  . 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 Restless leg syndrome       Past Surgical History:  Procedure Laterality Date  . AORTIC VALVE REPLACEMENT  04/10/2011   [facility]  . CARDIAC CATHETERIZATION  2008  . CARDIAC CATHETERIZATION  07/2009   minor irregulreties without obstructive disease  . COLONOSCOPY W/  POLYPECTOMY    . ESOPHAGOGASTRODUODENOSCOPY ENDOSCOPY    . IVC FILTER PLACEMENT ([facility] HX)    . MITRAL VALVE REPAIR  04/10/2011   autologous pericardial patch augmentation of posterior leaflet with 82m Sorin Memo 3D ring annuloplasty  . TEE WITHOUT CARDIOVERSION N/A 11/06/2015   Procedure: TRANSESOPHAGEAL ECHOCARDIOGRAM (TEE);  Surgeon: TSkeet Latch MD;  [facility] [facility]  Service: Cardiovascular;  Laterality: N/A;  . THYROIDECTOMY    . TOTAL HIP ARTHROPLASTY Left 08/10/2015   Procedure: LEFT TOTAL HIP ARTHROPLASTY ANTERIOR APPROACH;  Surgeon: CMcarthur Rossetti MD;  [facility]  Service: Orthopedics;  Laterality: Left;      Social History:     Social History  Substance Use Topics  . Smoking status: Former Smoker    Packs/day: 1.00    Years: 6.00    Types: Cigarettes    Quit date: 01/22/1974  . Smokeless tobacco: Never Used  . Alcohol use No     Lives - at home  Mobility -     Family History :     Family History  Problem Relation Age of Onset  . Hypertension Father   . Hyperlipidemia Father   . CAD Father   . Diabetes Mother   . Hypertension Mother   . Cancer  Mother   . CAD Mother   . Coronary artery disease Mother   . Stroke Mother   . Kidney disease Mother   . Hyperlipidemia Mother   . Diabetes Maternal Grandmother   . Diabetes Maternal Grandfather   . Diabetes Paternal Grandmother   . Diabetes Paternal Grandfather       Home Medications:   Prior to Admission medications   Medication Sig Start Date End Date Taking? Authorizing Provider  acetaminophen (TYLENOL) 650 MG CR tablet Take 1,300 mg by mouth daily as needed for pain.    Yes Historical Provider, MD  amitriptyline (ELAVIL) 50 MG tablet Take 50 mg by mouth at bedtime. 12/19/15  Yes Historical Provider, MD  aspirin (ECOTRIN) 325 MG EC tablet Take 325 mg by mouth daily.   Yes Historical Provider, MD  Cholecalciferol (VITAMIN D3) 5000 units CAPS Take 1 capsule by mouth daily.   Yes Historical Provider, MD  diazepam (VALIUM) 5 MG tablet Take 5 mg by mouth at bedtime.  06/01/14  Yes Historical Provider, MD  Ferrous Sulfate-C-Folic Acid 1086-761-9.5MG TBCR Take 500 mg by mouth daily.   Yes Historical Provider, MD  FLUoxetine (PROZAC) 40 MG capsule Take 40 mg by mouth daily.   Yes Historical Provider, MD  HYDROcodone-acetaminophen (NORCO/VICODIN) 5-325 MG tablet take 1 tablet by mouth every 6 hours if needed for SEVERE PAIN 09/20/15  Yes Historical Provider, MD  levothyroxine (SYNTHROID, LEVOTHROID) 112 MCG tablet Take 112 mcg by mouth daily before breakfast.  10/26/15  Yes Historical Provider, MD  lisinopril (PRINIVIL,ZESTRIL) 10 MG tablet Take 10 mg by mouth daily.   Yes Historical Provider, MD  metFORMIN (GLUCOPHAGE-XR) 500 MG 24 hr tablet Take 500 mg by mouth 2 (two) times daily. 10/22/15  Yes Historical Provider, MD  metoprolol tartrate (LOPRESSOR) 25 MG tablet take 1 tablet by mouth once daily Patient taking differently: take 1 tablet by mouth once daily in the evening 12/25/15  Yes PJosue Hector MD  omeprazole (PRILOSEC) 20 MG capsule Take 20 mg by mouth at bedtime.    Yes Historical  Provider, MD  rivaroxaban (XARELTO) 20 MG TABS tablet Take 1 tablet (20 mg total) by mouth daily. Patient taking differently: Take 20  mg by mouth daily with supper.  03/22/15  Yes Josue Hector, MD  simvastatin (ZOCOR) 40 MG tablet take 1 tablet by mouth at bedtime 01/25/16  Yes Josue Hector, MD     Allergies:     Allergies  Allergen Reactions  . Invokana [Canagliflozin] Rash     Physical Exam:   Vitals  Blood pressure 110/80, pulse 90, temperature 97.8 F (36.6 C), temperature source Oral, resp. rate 19, height 5' 11"  (1.803 m), weight 128.8 kg (284 lb), SpO2 98 %.   1. General lying in bed in NAD,    2. Normal affect and insight, Not Suicidal or Homicidal, Awake Alert, Oriented X 3.  3. No F.N deficits, ALL C.Nerves Intact, Strength 5/5 all 4 extremities, Sensation intact all 4 extremities, Plantars down going.  4. Ears and Eyes appear Normal, Conjunctivae clear, PERRLA. Moist Oral Mucosa.  5. Supple Neck, No JVD, No cervical lymphadenopathy appriciated, No Carotid Bruits.  6. Symmetrical Chest wall movement, Good air movement bilaterally, CTAB.  7. RRR, No Gallops, Rubs or Murmurs, No Parasternal Heave.  8. Positive Bowel Sounds, Abdomen Soft, No tenderness, No organomegaly appriciated,No rebound -guarding or rigidity.  9.  No Cyanosis, Normal Skin Turgor, No Skin Rash or Bruise.  10. Good muscle tone,  joints appear normal , no effusions, Normal ROM.  11. No Palpable Lymph Nodes in Neck or Axillae     Data Review:    CBC  Recent Labs Lab 02/21/16 1929  WBC 7.0  HGB 8.5*  HCT 28.6*  PLT 164  MCV 79.2  MCH 23.5*  MCHC 29.7*  RDW 20.4*  LYMPHSABS 1.1  MONOABS 0.2  EOSABS 0.6  BASOSABS 0.0   ------------------------------------------------------------------------------------------------------------------  Chemistries   Recent Labs Lab 02/21/16 1929 02/21/16 1958  NA 133*  --   K 3.5  --   CL 101  --   CO2 25  --   GLUCOSE 151*  --   BUN  17  --   CREATININE 1.04  --   CALCIUM 8.6*  --   MG  --  1.7   ------------------------------------------------------------------------------------------------------------------ estimated creatinine clearance is 96.9 mL/min (by C-G formula based on SCr of 1.04 mg/dL). ------------------------------------------------------------------------------------------------------------------ No results for input(s): TSH, T4TOTAL, T3FREE, THYROIDAB in the last 72 hours.  Invalid input(s): FREET3  Coagulation profile No results for input(s): INR, PROTIME in the last 168 hours. ------------------------------------------------------------------------------------------------------------------- No results for input(s): DDIMER in the last 72 hours. -------------------------------------------------------------------------------------------------------------------  Cardiac Enzymes  Recent Labs Lab 02/21/16 1929  TROPONINI 0.05*   ------------------------------------------------------------------------------------------------------------------    Component Value Date/Time   BNP 398.0 (H) 11/03/2015 1722   BNP 64.8 01/24/2012 1150     ---------------------------------------------------------------------------------------------------------------  Urinalysis    Component Value Date/Time   LABSPEC >1.046 (H) 04/09/2011 1746   PHURINE 7.0 04/09/2011 1746   GLUCOSEU NEGATIVE 04/09/2011 1746   HGBUR NEGATIVE 04/09/2011 1746   BILIRUBINUR NEGATIVE 04/09/2011 1746   KETONESUR NEGATIVE 04/09/2011 1746   PROTEINUR 100 (A) 04/09/2011 1746   UROBILINOGEN 0.2 04/09/2011 1746   NITRITE NEGATIVE 04/09/2011 1746   LEUKOCYTESUR NEGATIVE 04/09/2011 1746    ----------------------------------------------------------------------------------------------------------------   Imaging Results:    Dg Chest 1 View  Result Date: 02/21/2016 CLINICAL DATA:  Shortness of breath. EXAM: CHEST 1 VIEW COMPARISON:   Radiographs of Nov 03, 2015. FINDINGS: Stable cardiomediastinal silhouette. No pneumothorax or pleural effusion is noted. Bony thorax is unremarkable. Sternotomy wires are noted. No acute pulmonary disease is noted. IMPRESSION: No acute cardiopulmonary abnormality seen. Electronically  Signed   By: Marijo Conception, M.D.   On: 02/21/2016 18:52      Assessment & Plan:    Active Problems:   Dyspnea   Elevated troponin I level   Anemia   Chronic anemia    1. Fatigue, Dyspnea ? Secondary to anemia Check bnp Check echo  2. Troponin elevation Trop I q6h x3 Check echo Consult cardiology  3. Anemia Check ferritin, iron, tibc, b12, folate, tsh  4. Hypothyroidism Cont levothyroxine  5. Subjective fever? Check esr, blood culture x2.       DVT Prophylaxis Xarelto,  SCDs   AM Labs Ordered, also please review Full Orders  Family Communication: Admission, patients condition and plan of care including tests being ordered have been discussed with the patient  who indicate understanding and agree with the plan and Code Status.  Code Status full code  Likely DC to  home  Condition GUARDED    Consults called:   Admission status:  observation  Time spent in minutes : 45 minutes   Jani Gravel M.D on 02/21/2016 at 10:28 PM  Between 7am to 7pm - Pager - [phone]. After 7pm go to [email or web address] - password [email or web address]  [facility] - Office  [phone]

## 2016-02-21 NOTE — ED Triage Notes (Addendum)
[facility].  Reports 2 weeks ago he started feeling very tired and "drained."  Pt's pcp ran some tests and ordered him an iron infusion.  Reports he had the infusion last Friday but didn't tolerate it well.  Reports he felt weaker than usual and sob.  Pt says still very fatigued and SOB.

## 2016-02-21 NOTE — ED Notes (Signed)
CRITICAL VALUE ALERT  Critical value received:  Troponin 0.05  Date of notification:  02/21/16  Time of notification:  2020  Critical value read back:Yes.    Nurse who received alert:  Bronson CurbKristy Kashae Carstens, RN  MD notified (1st page):  Dr Juleen Chinakohut  Time of first page:  2020  MD notified (2nd page):  Time of second page:  Responding MD:  Dr Juleen Chinakohut  Time MD responded:  2020

## 2016-02-21 NOTE — ED Provider Notes (Signed)
[facility]-EMERGENCY DEPT Provider Note   CSN: [id number] Arrival date & time: 02/21/16  1812     History   Chief Complaint Chief Complaint  Patient presents with  . Fatigue  . Shortness of Breath    HPI [name] [name] is a 65 y.o. male.  HPI    65 y.o. male with a past history of Grade 2 diastolic heart failure on 08/2013 with preserved LVEF, CAD, DVT, HTN, history of bacterial endocarditis with enterococcus faecalis resulting in severe aortic insufficiency and mitral valve regurgitation status post mitral valve and aortic valve replacement in 2012 with bioprosthetic valves on Xarelto.   More recent he was hospitalized this past May with bacteremia. 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 He was evaluated by infectious disease. Patient underwent TEE which did not show any endocarditis. He finished 6 weeks of abx.   Over the past two weeks he has been generally weak and had increasing dyspnea. Intermittent fever most recently as 4 days ago. Occasional chills. Anorexia. He continues to lose weight. Denies any acute pain. No urinary complaints. No cough. No unusual leg pain or swelling.     Past Medical History:  Diagnosis Date  . 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 Arthritis   . CHF (congestive heart failure) (HCC)   . Coronary artery disease (CAD) excluded 2008, 2011   Mild coronary plaque.  w/ normal LV function. repeated cardiac cath on 07/2009  . Deep venous thrombosis (HCC)    recurrent DVT and PE, status post IVC filter  . Depression   . Diabetes mellitus   . Dyspnea    with exertion  . GERD (gastroesophageal reflux disease)   . Heart murmur   . History of endocarditis    status post Enterococcus faecalis, secondary to chronic injections with low molecular weight heparin  . Hyperlipidemia   . Hypertension   . Mitral regurgitation    severe mitral  regurgitation secondary to endocarditis  . Obesity   . OSA (obstructive sleep apnea)    CPAP  . 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 Restless leg syndrome     Patient Active Problem List   Diagnosis Date Noted  . Chronic anticoagulation 12/05/2015  . Chronic anemia 12/05/2015  . Endocarditis   . Poor dentition   . Streptococcal bacteremia 11/04/2015  . Unintentional weight loss 11/04/2015  . Elevated troponin I level 11/03/2015  . Anemia 11/03/2015  . Unexplained weight loss 11/02/2015  . Fatigue 11/02/2015  . Chills 11/02/2015  . 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 Seasonal and perennial allergic rhinitis 10/13/2015  . Osteoarthritis of right hip 08/10/2015  . 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 Uncontrolled type 2 diabetes mellitus (HCC) 04/28/2015  . Primary hypothyroidism 04/28/2015  . Hyperlipidemia 04/28/2015  . 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 Varicose vein 01/24/2012  . RLS (restless legs syndrome) 07/11/2011  . History of DVT (deep vein thrombosis)   . History of endocarditis   . Coronary artery disease (CAD) excluded   . Extrasystole 04/30/2011  . S/P AVR (aortic valve replacement) 04/10/2011  . S/P mitral valve repair 04/10/2011  . Mitral regurgitation 03/29/2011  . Aortic insufficiency   . Dyspnea 11/15/2010  . Morbid obesity (HCC) 02/20/2009  . DEPRESSION 02/20/2009  . Obstructive sleep apnea 02/20/2009  . Essential hypertension 02/20/2009  . SKIN LESION 02/20/2009  .  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 AORTIC VALVE REPLACEMENT  04/10/2011   25mm [facility] Ease pericardial bioprosthetic aortic valve  . CARDIAC CATHETERIZATION  2008  . CARDIAC CATHETERIZATION  07/2009   minor irregulreties without obstructive disease  . COLONOSCOPY W/ POLYPECTOMY    . ESOPHAGOGASTRODUODENOSCOPY ENDOSCOPY    .  IVC FILTER PLACEMENT ([facility] HX)    . MITRAL VALVE REPAIR  04/10/2011   autologous pericardial patch augmentation of posterior leaflet with 28mm Sorin Memo 3D ring annuloplasty  . TEE WITHOUT CARDIOVERSION N/A 11/06/2015   Procedure: TRANSESOPHAGEAL ECHOCARDIOGRAM (TEE);  Surgeon: Chilton Si, MD;  Location: [facility] ENDOSCOPY;  Service: Cardiovascular;  Laterality: N/A;  . THYROIDECTOMY    . TOTAL HIP ARTHROPLASTY Left 08/10/2015   Procedure: LEFT TOTAL HIP ARTHROPLASTY ANTERIOR APPROACH;  Surgeon: Kathryne Hitch, MD;  Location: [facility] OR;  Service: Orthopedics;  Laterality: Left;       Home Medications    Prior to Admission medications   Medication Sig Start Date End Date Taking? Authorizing Provider  acetaminophen (TYLENOL) 650 MG CR tablet Take 1,300 mg by mouth daily.    Historical Provider, MD  amitriptyline (ELAVIL) 50 MG tablet Take 50 mg by mouth at bedtime. 12/19/15   Historical Provider, MD  aspirin (ECOTRIN) 325 MG EC tablet Take 325 mg by mouth daily.    Historical Provider, MD  Cholecalciferol (VITAMIN D3) 5000 units CAPS Take 1 capsule by mouth daily.    Historical Provider, MD  diazepam (VALIUM) 5 MG tablet Take 5 mg by mouth at bedtime.  06/01/14   Historical Provider, MD  Ferrous Sulfate-C-Folic Acid 105-500-0.8 MG TBCR Take 500 mg by mouth daily.    Historical Provider, MD  FLUoxetine (PROZAC) 40 MG capsule Take 40 mg by mouth daily.    Historical Provider, MD  furosemide (LASIX) 40 MG tablet Take 40 mg by mouth Daily.  04/14/11   Historical Provider, MD  HYDROcodone-acetaminophen (NORCO/VICODIN) 5-325 MG tablet take 1 tablet by mouth every 6 hours if needed for SEVERE PAIN 09/20/15   Historical Provider, MD  levothyroxine (SYNTHROID, LEVOTHROID) 112 MCG tablet Take 112 mcg by mouth daily before breakfast.  10/26/15   Historical Provider, MD  lisinopril (PRINIVIL,ZESTRIL) 10 MG tablet Take 10 mg by mouth daily.    Historical Provider, MD  metFORMIN (GLUCOPHAGE-XR) 500 MG 24  hr tablet Take 500 mg by mouth 2 (two) times daily. 10/22/15   Historical Provider, MD  metoprolol tartrate (LOPRESSOR) 25 MG tablet take 1 tablet by mouth once daily 12/25/15   Wendall Stade, MD  omeprazole (PRILOSEC) 20 MG capsule Take 20 mg by mouth at bedtime.     Historical Provider, MD  rivaroxaban (XARELTO) 20 MG TABS tablet Take 1 tablet (20 mg total) by mouth daily. 03/22/15   Wendall Stade, MD  simvastatin (ZOCOR) 40 MG tablet take 1 tablet by mouth at bedtime 01/25/16   Wendall Stade, MD    Family History Family History  Problem Relation Age of Onset  . Hypertension Father   . Hyperlipidemia Father   . CAD Father   . Diabetes Mother   . Hypertension Mother   . Cancer Mother   . CAD Mother   . Coronary artery disease Mother   . Stroke Mother   . Kidney disease Mother   . Hyperlipidemia Mother   . Diabetes Maternal Grandmother   . Diabetes Maternal Grandfather   . Diabetes  Paternal Grandmother   . Diabetes Paternal Grandfather     Social History Social History  Substance Use Topics  . Smoking status: Former Smoker    Packs/day: 1.00    Years: 6.00    Types: Cigarettes    Quit date: 01/22/1974  . Smokeless tobacco: Never Used  . Alcohol use No     Allergies   Invokana [canagliflozin]   Review of Systems Review of Systems  All systems reviewed and negative, other than as noted in HPI.   Physical Exam Updated Vital Signs BP 95/68 (BP Location: Left Arm)   Pulse 95   Temp 97.8 F (36.6 C) (Oral)   Resp 20   Ht 5\' 11"  (1.803 m)   Wt 284 lb (128.8 kg)   SpO2 98%   BMI 39.61 kg/m   Physical Exam  Constitutional: No distress.  Pale and seems tires but nontoxic appearing.   HENT:  Head: Normocephalic and atraumatic.  Eyes: Conjunctivae are normal. Right eye exhibits no discharge. Left eye exhibits no discharge.  Neck: Neck supple.  Cardiovascular: Normal rate, regular rhythm and normal heart sounds.  Exam reveals no gallop and no friction rub.   No  murmur heard. Pulmonary/Chest: Effort normal and breath sounds normal. No respiratory distress.  Abdominal: Soft. He exhibits no distension. There is no tenderness.  Musculoskeletal: He exhibits no edema or tenderness.  Neurological: He is alert.  Skin: Skin is warm and dry.  Psychiatric: He has a normal mood and affect. His behavior is normal. Thought content normal.  Nursing note and vitals reviewed.    ED Treatments / Results  Labs (all labs ordered are listed, but only abnormal results are displayed) Labs Reviewed  CBC WITH DIFFERENTIAL/PLATELET - Abnormal; Notable for the following:       Result Value   RBC 3.61 (*)    Hemoglobin 8.5 (*)    HCT 28.6 (*)    MCH 23.5 (*)    MCHC 29.7 (*)    RDW 20.4 (*)    All other components within normal limits  BASIC METABOLIC PANEL - Abnormal; Notable for the following:    Sodium 133 (*)    Glucose, Bld 151 (*)    Calcium 8.6 (*)    All other components within normal limits  TROPONIN I - Abnormal; Notable for the following:    Troponin I 0.05 (*)    All other components within normal limits  SEDIMENTATION RATE - Abnormal; Notable for the following:    Sed Rate 70 (*)    All other components within normal limits  CULTURE, BLOOD (ROUTINE X 2)  CULTURE, BLOOD (ROUTINE X 2)  MAGNESIUM  URINALYSIS, ROUTINE W REFLEX MICROSCOPIC (NOT AT [facility])  C-REACTIVE PROTEIN  TSH    EKG  EKG Interpretation  Date/Time:  Wednesday February 21 2016 18:40:03 EDT Ventricular Rate:  93 PR Interval:  174 QRS Duration: 96 QT Interval:  412 QTC Calculation: 512 R Axis:   -2 Text Interpretation:  Normal sinus rhythm Prolonged QT Abnormal ECG Confirmed by Juleen China  MD, [place] Monds ([id number]) on 02/21/2016 8:26:02 PM       Radiology Dg Chest 1 View  Result Date: 02/21/2016 CLINICAL DATA:  Shortness of breath. EXAM: CHEST 1 VIEW COMPARISON:  Radiographs of Nov 03, 2015. FINDINGS: Stable cardiomediastinal silhouette. No pneumothorax or pleural effusion is noted.  Bony thorax is unremarkable. Sternotomy wires are noted. No acute pulmonary disease is noted. IMPRESSION: No acute cardiopulmonary abnormality seen. Electronically Signed   By: Fayrene Fearing  Christen ButterGreen Jr, M.D.   On: 02/21/2016 18:52    Procedures Procedures (including critical care time)  Medications Ordered in ED Medications - No data to display   Initial Impression / Assessment and Plan / ED Course  I have reviewed the triage vital signs and the nursing notes.  Pertinent labs & imaging results that were available during my care of the patient were reviewed by me and considered in my medical decision making (see chart for details).  Clinical Course    64yM with increasing fatigue/dyspnea. Intermittent fevers. Concern for possible recurrent endocarditis/bacteremia with this history. He appears tired, but not toxic. Will send blood cultures. He is afebrile, HD stable and no leukocytosis. No need for empiric abx. Consider primary cardiac cause, but I feel less likely. Troponin is only minimally elevated. Denies CP. EKG not significantly changed.   With his history, progression of symptoms, and mild elevation in troponin I think it may be prudent to admit him.    Final Clinical Impressions(s) / ED Diagnoses   Final diagnoses:  Dyspnea  Anemia, unspecified anemia type  Generalized weakness    New Prescriptions New Prescriptions   No medications on file     Raeford RazorStephen Idalis Hoelting, MD 03/02/16 1629

## 2016-02-21 NOTE — Telephone Encounter (Signed)
Called patient and talked to his wife (DPR). She stated that her husband plans on going to ER today. Informed her of Dr. Fabio BeringNishan's recommendations. Encouraged patient's wife to give our office a call with any other questions or concerns. Patient's wife verbalized understanding.

## 2016-02-22 ENCOUNTER — Observation Stay (HOSPITAL_COMMUNITY): Payer: Medicare Other

## 2016-02-22 ENCOUNTER — Observation Stay (HOSPITAL_BASED_OUTPATIENT_CLINIC_OR_DEPARTMENT_OTHER): Payer: Medicare Other

## 2016-02-22 DIAGNOSIS — D649 Anemia, unspecified: Secondary | ICD-10-CM | POA: Diagnosis not present

## 2016-02-22 DIAGNOSIS — R0600 Dyspnea, unspecified: Secondary | ICD-10-CM

## 2016-02-22 DIAGNOSIS — R7989 Other specified abnormal findings of blood chemistry: Secondary | ICD-10-CM | POA: Diagnosis not present

## 2016-02-22 DIAGNOSIS — G2581 Restless legs syndrome: Secondary | ICD-10-CM

## 2016-02-22 DIAGNOSIS — Z954 Presence of other heart-valve replacement: Secondary | ICD-10-CM | POA: Diagnosis not present

## 2016-02-22 DIAGNOSIS — D508 Other iron deficiency anemias: Secondary | ICD-10-CM | POA: Diagnosis not present

## 2016-02-22 LAB — BLOOD CULTURE ID PANEL (REFLEXED)

## 2016-02-22 LAB — GLUCOSE, CAPILLARY
GLUCOSE-CAPILLARY: 115 mg/dL — AB (ref 65–99)
Glucose-Capillary: 156 mg/dL — ABNORMAL HIGH (ref 65–99)

## 2016-02-22 LAB — TSH: TSH: 1.641 u[IU]/mL (ref 0.350–4.500)

## 2016-02-22 LAB — IRON AND TIBC
IRON: 36 ug/dL — AB (ref 45–182)
SATURATION RATIOS: 12 % — AB (ref 17.9–39.5)
TIBC: 307 ug/dL (ref 250–450)
UIBC: 271 ug/dL

## 2016-02-22 LAB — ECHOCARDIOGRAM COMPLETE
AOPV: 0.31 m/s
AV Area VTI index: 0.66 cm2/m2
AV Area mean vel: 1.54 cm2
AV VEL mean LVOT/AV: 0.37
AV peak Index: 0.49
AV vel: 1.7
AVA: 1.7 cm2
AVAREAMEANVIN: 0.59 cm2/m2
AVAREAVTI: 1.28 cm2
AVG: 33 mmHg
AVLVOTPG: 8 mmHg
AVPG: 81 mmHg
AVPKVEL: 451 cm/s
CHL CUP TV REG PEAK VELOCITY: 459 cm/s
DOP CAL AO MEAN VELOCITY: 260 cm/s
E decel time: 377 msec
E/e' ratio: 19.31
FS: 33 % (ref 28–44)
HEIGHTINCHES: 71 in
IVS/LV PW RATIO, ED: 0.88
LA ID, A-P, ES: 55 mm
LA diam end sys: 55 mm
LA diam index: 2.12 cm/m2
LA vol A4C: 102 ml
LAVOL: 131 mL
LAVOLIN: 50.5 mL/m2
LV PW d: 15 mm — AB (ref 0.6–1.1)
LV SIMPSON'S DISK: 62
LV TDI E'LATERAL: 8.7
LV e' LATERAL: 8.7 cm/s
LV sys vol index: 15 mL/m2
LVDIAVOL: 102 mL (ref 62–150)
LVDIAVOLIN: 39 mL/m2
LVEEAVG: 19.31
LVEEMED: 19.31
LVOT VTI: 29.9 cm
LVOT area: 4.15 cm2
LVOT peak vel: 139 cm/s
LVOTD: 23 mm
LVOTSV: 124 mL
LVOTVTI: 0.41 cm
LVSYSVOL: 39 mL (ref 21–61)
MV Dec: 377
MV pk E vel: 168 m/s
MVPG: 11 mmHg
MVPKAVEL: 129 m/s
RV LATERAL S' VELOCITY: 9.46 cm/s
RV TAPSE: 13.5 mm
RV sys press: 87 mmHg
Stroke v: 63 ml
TR max vel: 459 cm/s
VTI: 73.1 cm
Valve area index: 0.66
WEIGHTICAEL: 4544 [oz_av]

## 2016-02-22 LAB — C-REACTIVE PROTEIN: CRP: 7.6 mg/dL — ABNORMAL HIGH (ref <1.0)

## 2016-02-22 LAB — TROPONIN I
TROPONIN I: 0.18 ng/mL — AB (ref <0.03)
TROPONIN I: 0.16 ng/mL — AB (ref <0.03)
Troponin I: 0.05 ng/mL (ref <0.03)
Troponin I: 0.08 ng/mL (ref <0.03)
Troponin I: 0.16 ng/mL (ref <0.03)

## 2016-02-22 LAB — COMPREHENSIVE METABOLIC PANEL
ALBUMIN: 2.8 g/dL — AB (ref 3.5–5.0)
ALT: 23 U/L (ref 17–63)
AST: 32 U/L (ref 15–41)
Alkaline Phosphatase: 110 U/L (ref 38–126)
Anion gap: 6 (ref 5–15)
BILIRUBIN TOTAL: 1.2 mg/dL (ref 0.3–1.2)
BUN: 15 mg/dL (ref 6–20)
CHLORIDE: 104 mmol/L (ref 101–111)
CO2: 24 mmol/L (ref 22–32)
Calcium: 8.3 mg/dL — ABNORMAL LOW (ref 8.9–10.3)
Creatinine, Ser: 0.91 mg/dL (ref 0.61–1.24)
GFR calc Af Amer: >60 mL/min (ref >60)
GFR calc non Af Amer: >60 mL/min (ref >60)
GLUCOSE: 132 mg/dL — AB (ref 65–99)
POTASSIUM: 3.6 mmol/L (ref 3.5–5.1)
Sodium: 134 mmol/L — ABNORMAL LOW (ref 135–145)
Total Protein: 7.5 g/dL (ref 6.5–8.1)

## 2016-02-22 LAB — HEMOGLOBIN AND HEMATOCRIT, BLOOD
HCT: 29.6 % — ABNORMAL LOW (ref 39.0–52.0)
HEMOGLOBIN: 9 g/dL — AB (ref 13.0–17.0)

## 2016-02-22 LAB — CBC
HEMATOCRIT: 27 % — AB (ref 39.0–52.0)
Hemoglobin: 8.1 g/dL — ABNORMAL LOW (ref 13.0–17.0)
MCH: 23.7 pg — ABNORMAL LOW (ref 26.0–34.0)
MCHC: 30 g/dL (ref 30.0–36.0)
MCV: 78.9 fL (ref 78.0–100.0)
Platelets: 178 10*3/uL (ref 150–400)
RBC: 3.42 MIL/uL — ABNORMAL LOW (ref 4.22–5.81)
RDW: 20.6 % — AB (ref 11.5–15.5)
WBC: 8.9 10*3/uL (ref 4.0–10.5)

## 2016-02-22 LAB — SEDIMENTATION RATE: SED RATE: 76 mm/h — AB (ref 0–16)

## 2016-02-22 LAB — FERRITIN: Ferritin: 222 ng/mL (ref 24–336)

## 2016-02-22 LAB — ABO/RH: ABO/RH(D): A POS

## 2016-02-22 LAB — VITAMIN B12: VITAMIN B 12: 455 pg/mL (ref 180–914)

## 2016-02-22 LAB — PREPARE RBC (CROSSMATCH)

## 2016-02-22 MED ORDER — ENSURE ENLIVE PO LIQD
237.0000 mL | Freq: Two times a day (BID) | ORAL | Status: DC
  Administered 2016-02-22 – 2016-02-23 (×3): 237 mL via ORAL

## 2016-02-22 MED ORDER — INSULIN ASPART 100 UNIT/ML ~~LOC~~ SOLN
0.0000 [IU] | Freq: Three times a day (TID) | SUBCUTANEOUS | Status: DC
  Administered 2016-02-23 – 2016-02-26 (×5): 2 [IU] via SUBCUTANEOUS

## 2016-02-22 MED ORDER — DEXTROSE 5 % IV SOLN
2.0000 g | INTRAVENOUS | Status: DC
  Administered 2016-02-23 – 2016-02-26 (×4): 2 g via INTRAVENOUS
  Filled 2016-02-22 (×5): qty 2

## 2016-02-22 MED ORDER — VANCOMYCIN HCL 10 G IV SOLR
2500.0000 mg | Freq: Once | INTRAVENOUS | Status: AC
  Administered 2016-02-22: 2500 mg via INTRAVENOUS
  Filled 2016-02-22: qty 2500

## 2016-02-22 MED ORDER — POTASSIUM CHLORIDE CRYS ER 20 MEQ PO TBCR
20.0000 meq | EXTENDED_RELEASE_TABLET | Freq: Every day | ORAL | Status: DC
  Administered 2016-02-22 – 2016-02-26 (×5): 20 meq via ORAL
  Filled 2016-02-22 (×5): qty 1

## 2016-02-22 MED ORDER — CEFTRIAXONE SODIUM 2 G IJ SOLR
INTRAMUSCULAR | Status: AC
  Filled 2016-02-22: qty 2

## 2016-02-22 MED ORDER — FERROUS SULFATE 325 (65 FE) MG PO TABS
325.0000 mg | ORAL_TABLET | Freq: Every day | ORAL | Status: DC
  Administered 2016-02-22 – 2016-02-26 (×5): 325 mg via ORAL
  Filled 2016-02-22 (×4): qty 1

## 2016-02-22 MED ORDER — VANCOMYCIN HCL 10 G IV SOLR
1250.0000 mg | Freq: Two times a day (BID) | INTRAVENOUS | Status: DC
  Administered 2016-02-23 – 2016-02-25 (×5): 1250 mg via INTRAVENOUS
  Filled 2016-02-22 (×8): qty 1250

## 2016-02-22 MED ORDER — DEXTROSE 5 % IV SOLN
2.0000 g | INTRAVENOUS | Status: DC
  Administered 2016-02-22: 2 g via INTRAVENOUS
  Filled 2016-02-22 (×4): qty 2

## 2016-02-22 MED ORDER — VANCOMYCIN HCL 500 MG IV SOLR
INTRAVENOUS | Status: AC
  Filled 2016-02-22: qty 500

## 2016-02-22 MED ORDER — VITAMIN C 500 MG PO TABS
500.0000 mg | ORAL_TABLET | Freq: Every day | ORAL | Status: DC
  Administered 2016-02-22 – 2016-02-26 (×5): 500 mg via ORAL
  Filled 2016-02-22 (×5): qty 1

## 2016-02-22 MED ORDER — FOLIC ACID 1 MG PO TABS
1.0000 mg | ORAL_TABLET | Freq: Every day | ORAL | Status: DC
  Administered 2016-02-22 – 2016-02-26 (×5): 1 mg via ORAL
  Filled 2016-02-22 (×5): qty 1

## 2016-02-22 MED ORDER — SODIUM CHLORIDE 0.9 % IV SOLN
Freq: Once | INTRAVENOUS | Status: AC
  Administered 2016-02-22: 16:00:00 via INTRAVENOUS

## 2016-02-22 MED ORDER — VANCOMYCIN HCL 1000 MG IV SOLR
INTRAVENOUS | Status: AC
  Filled 2016-02-22: qty 2000

## 2016-02-22 MED ORDER — INSULIN ASPART 100 UNIT/ML ~~LOC~~ SOLN: 0.0000 [IU] | Freq: Every day | SUBCUTANEOUS | Status: DC

## 2016-02-22 NOTE — Progress Notes (Signed)
Pharmacy Antibiotic Note  [name] [name] is a 65 y.o. male admitted on 02/21/2016 with sepsis.  Pharmacy has been consulted for vancomycin and rocephin dosing. [facility]  Plan:Vancomycin 2500 mg IV X 1 then 1250 mg IV q12 hours Rocephin 2 gm IV q24 hours F/u renal function, cultures and clinical course   Height: 5\' 11"  (180.3 cm) Weight: 284 lb (128.8 kg) IBW/kg (Calculated) : 75.3  Temp (24hrs), Avg:97.9 F (36.6 C), Min:97.5 F (36.4 C), Max:98.3 F (36.8 C)   Recent Labs Lab 02/21/16 1929 02/22/16 0534  WBC 7.0 8.9  CREATININE 1.04 0.91    Estimated Creatinine Clearance: 110.7 mL/min (by C-G formula based on SCr of 0.91 mg/dL).    Allergies  Allergen Reactions  . Invokana [Canagliflozin] Rash    Antimicrobials this admission: rocephin 8/31 >>  vancomycin 8/31 >>    Microbiology results: 8/30  BCx: gpc  Thank you for allowing pharmacy to be a part of this patient's care.  Woodfin GanjaSeay, Cyle Kenyon Poteet 02/22/2016 6:17 PM

## 2016-02-22 NOTE — Progress Notes (Signed)
*  PRELIMINARY RESULTS* Echocardiogram 2D Echocardiogram has been performed.  Stacey DrainWhite, Yenty Bloch J 02/22/2016, 12:22 PM

## 2016-02-22 NOTE — Care Management Obs Status (Signed)
MEDICARE OBSERVATION STATUS NOTIFICATION   Patient Details  Name: [name] [name] Descoteaux MRN: [id number] Date of Birth: 04/09/1951   Medicare Observation Status Notification Given:  Yes    Malcolm MetroChildress, Talar Fraley Demske, RN 02/22/2016, 1:04 PM

## 2016-02-22 NOTE — Consult Note (Signed)
CARDIOLOGY CONSULT NOTE   Patient ID: DEVONTE MIGUES MRN: [id number] DOB/AGE: [birth year] 66 y.o.  Admit Date: 02/21/2016 Referring Physician: Murray Hodgkins MD Primary Physician: Deloria Lair, MD Consulting Cardiologist: Jenkins Rouge MD Primary Cardiologist: Jenkins Rouge MD Reason for Consultation: Dyspnea with elevated troponins  Clinical Summary Mr. Richman is a 65 y.o.male with known history of endocarditis, with negative TEE for vegetation in May, 2017, S/P Valve replacement (25 mm [facility] Ease pericardial tissue valve), S/P mitral valve repair (pericardial patch augmentation of posterior leaflet with 23m Sorin Memo 3D ring annuloplasty Oct 2012), CAD, chronic anticoagulation, diabetes and anemia. He received blood transfusions X 2 in May. Despite negative TEE, he was treated with antibiotics in the setting of bacteremia in May of 2017.   He was admitted with worsening dyspnea, fatigue and was found to be anemic with Hgb of 8.1 and Hct of 27.0, Troponin 0.05, and 0.08 respectively. Due to abnormal troponin, we are asked for cardiology recommendation.   He states the began to feel fatigued and sluggish about a month ago. Thought it was related to the hot weather. Symptoms worsened and breathing became tedious. Presented to ER.   On arrival to ER he was found to be hypotensive with BP 95/68. HR 95. O2 sat 98%,. Afebrile. Sed rate of 70. He was found to be anemic. Hgb 8.5, Hct 28.6. WBC 7.0 Platlets 164. Na 133, Potassium 3.5, Glucose 151. Calcium 8.6. Troponin 0.05. EKG demonstrated NSR with prolonged QT interval with tachycardia. CXR negative for acute cardiopulmonary abnormality.   He states he doesn't feel much better since admission. He has some inspiratory discomfort. Denies chest pain. Slightly dizzy when he stands. No active bleeding or melena. Could not take iron supplements because they caused significant nausea so he stopped it.   Allergies  Allergen Reactions  .  Invokana [Canagliflozin] Rash    Medications Scheduled Medications: . amitriptyline  50 mg Oral QHS  . aspirin  325 mg Oral Daily  . cholecalciferol  5,000 Units Oral Daily  . diazepam  5 mg Oral QHS  . feeding supplement (ENSURE ENLIVE)  237 mL Oral BID BM  . ferrous sulfate  325 mg Oral Daily   And  . folic acid  1 mg Oral Daily   And  . vitamin C  500 mg Oral Daily  . FLUoxetine  40 mg Oral Daily  . furosemide  20 mg Intravenous Once  . levothyroxine  112 mcg Oral QAC breakfast  . lisinopril  10 mg Oral Daily  . metFORMIN  500 mg Oral BID WC  . metoprolol tartrate  25 mg Oral QPM  . pantoprazole  40 mg Oral Daily  . rivaroxaban  20 mg Oral Q supper  . simvastatin  40 mg Oral QHS  . sodium chloride flush  3 mL Intravenous Q12H     Infusions:     PRN Medications:  sodium chloride, acetaminophen **OR** acetaminophen, HYDROcodone-acetaminophen, sodium chloride flush   Past Medical History:  Diagnosis Date  . 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 Arthritis   . CHF (congestive heart failure) ([place]   . Coronary artery disease (CAD) excluded 2008, 2011   Mild coronary plaque.  w/ normal LV function. repeated cardiac cath on 07/2009  . Deep venous thrombosis (HCC)    recurrent DVT and PE, status post IVC filter  . Depression   . Diabetes mellitus   . Dyspnea    with  exertion  . GERD (gastroesophageal reflux disease)   . Heart murmur   . History of endocarditis    status post Enterococcus faecalis, secondary to chronic injections with low molecular weight heparin  . Hyperlipidemia   . Hypertension   . 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 Obesity   . OSA (obstructive sleep apnea)    CPAP  . 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 Restless leg syndrome     Past Surgical History:  Procedure Laterality Date  . AORTIC VALVE REPLACEMENT  04/10/2011    [facility] aortic valve  . CARDIAC CATHETERIZATION  2008  . CARDIAC CATHETERIZATION  07/2009   minor irregulreties without obstructive disease  . COLONOSCOPY W/ POLYPECTOMY    . ESOPHAGOGASTRODUODENOSCOPY ENDOSCOPY    . IVC FILTER PLACEMENT ([facility] HX)    . MITRAL VALVE REPAIR  04/10/2011   autologous pericardial patch augmentation of posterior leaflet with 29mSorin Memo 3D ring annuloplasty  . TEE WITHOUT CARDIOVERSION N/A 11/06/2015   Procedure: TRANSESOPHAGEAL ECHOCARDIOGRAM (TEE);  Surgeon: TiSkeet LatchMD;  [facility] Service: Cardiovascular;  Laterality: N/A;  . THYROIDECTOMY    . TOTAL HIP ARTHROPLASTY Left 08/10/2015   Procedure: LEFT TOTAL HIP ARTHROPLASTY ANTERIOR APPROACH;  Surgeon: ChMcarthur RossettiMD;  [place] [facility] Service: Orthopedics;  Laterality: Left;    Family History  Problem Relation Age of Onset  . Hypertension Father   . Hyperlipidemia Father   . CAD Father   . Diabetes Mother   . Hypertension Mother   . Cancer Mother   . CAD Mother   . Coronary artery disease Mother   . Stroke Mother   . Kidney disease Mother   . Hyperlipidemia Mother   . Diabetes Maternal Grandmother   . Diabetes Maternal Grandfather   . Diabetes Paternal Grandmother   . Diabetes Paternal Grandfather     Social History Mr. SmDeceports that he quit smoking about 42 years ago. His smoking use included Cigarettes. He has a 6.00 pack-year smoking history. He has never used smokeless tobacco. Mr. SmAlcockeports that he does not drink alcohol.  Review of Systems Complete review of systems are found to be negative unless outlined in H&P above.  Physical Examination Blood pressure 130/80, pulse 90, temperature 98.1 F (36.7 C), temperature source Axillary, resp. rate 20, height _0  (1.803 m), weight 284 lb (128.8 kg), SpO2 97 %.  Intake/Output Summary (Last 24 hours) at 02/22/16 0812 Last data filed at 02/22/16  0338  Gross per 24 hour  Intake                0 ml  Output              200 ml  Net             -200 ml    Telemetry: Sinus tachycardia   GEN: Ill appearing HEENT: Conjunctiva and lids normal, oropharynx clear with moist mucosa. Neck: Supple, no elevated JVP or carotid bruits, no thyromegaly. Lungs: Some inspiratory crackles. No wheezes or coughing.  Cardiac: Regular rate and rhythm, tachycardic with harsh systolic aortic valve murmur with gallop,  no pericardial rub. Abdomen: Soft, nontender, no hepatomegaly, bowel sounds present, no guarding or rebound. Extremities: No pitting edema, distal pulses 2+. Skin: Warm and dry.Pale Musculoskeletal: No kyphosis.Significant varicosities noted bilaterally.  Neuropsychiatric: Alert and oriented x3, affect grossly appropriate.  Prior  Cardiac Testing/Procedures 1. Echocardiogram 01/04/2016 Left ventricle: The cavity size was normal. Wall thickness was   increased in a pattern of mild LVH. Systolic function was normal.   The estimated ejection fraction was in the range of 55% to 60%.   Features are consistent with a pseudonormal left ventricular   filling pattern, with concomitant abnormal relaxation and   increased filling pressure (grade 2 diastolic dysfunction). - Aortic valve: Poor acoustic windows limit study AV is thickened   calcified with restricted motion. Peak and mean gradients through   the valve are 47 and 28 mm Hg respectively consistent with   moderate AS Valve area (VTI): 1.26 cm^2. - Mitral valve: Poor acoustic windows limit study MV is thickened   Peak and mean gradients through the valve are 28 and 6 mm Hg   respectively MVA by P T1/2 is 1.82 cm2 Calcified annulus. Mildly   thickened leaflets . - Left atrium: The atrium was severely dilated. - Right ventricle: The cavity size was mildly dilated. Wall   thickness was normal. - Right atrium: The atrium was mildly dilated.  Impressions:  Poor acoustic windows limit  study Cannot evaluate for endocarditis  2. Echocardiogram 11/06/2015 Left ventricle: Systolic function was normal. The estimated   ejection fraction was in the range of 55% to 60%. Wall motion was   normal; there were no regional wall motion abnormalities. - Aortic valve: A bioprosthesis was present and well-seated. Valve   area by planimetry was 1.59 cm^2. Although the transvalvular   velocity indicates severe aortic stenosis, this does not appear   to be present visually. Valve appears to function normally.   Gradient may be elevated due to anemia, sepsis or other causes of   high output. Peak velocity (S): 456 cm/s. Mean gradient (S): 52   mm Hg. Valve area (VTI): 1.24 cm^2. Valve area (Vmax): 1.34 cm^2.   Valve area (Vmean): 1.44 cm^2. - Mitral valve: Prior procedures included surgical repair. An   annular ring prosthesis was present. There was mild   regurgitation. Valve area by continuity equation (using LVOT   flow): 2.1 cm^2. - Left atrium: No evidence of thrombus in the atrial cavity or   appendage. No evidence of thrombus in the atrial cavity or   appendage. - Right atrium: No evidence of thrombus in the atrial cavity or   appendage. - Atrial septum: No defect or patent foramen ovale was identified   by color flow Doppler.  Lab Results  Basic Metabolic Panel:  Recent Labs Lab 02/21/16 1929 02/21/16 1958 02/22/16 0534  NA 133*  --  134*  K 3.5  --  3.6  CL 101  --  104  CO2 25  --  24  GLUCOSE 151*  --  132*  BUN 17  --  15  CREATININE 1.04  --  0.91  CALCIUM 8.6*  --  8.3*  MG  --  1.7  --     Liver Function Tests:  Recent Labs Lab 02/22/16 0534  AST 32  ALT 23  ALKPHOS 110  BILITOT 1.2  PROT 7.5  ALBUMIN 2.8*    CBC:  Recent Labs Lab 02/21/16 1929 02/22/16 0534  WBC 7.0 8.9  NEUTROABS 5.1  --   HGB 8.5* 8.1*  HCT 28.6* 27.0*  MCV 79.2 78.9  PLT 164 178    Cardiac Enzymes:  Recent Labs Lab 02/21/16 1929 02/22/16 0019  02/22/16 0538  TROPONINI 0.05* 0.05* 0.08*     Radiology: Dg  Chest 1 View  Result Date: 02/21/2016 CLINICAL DATA:  Shortness of breath. EXAM: CHEST 1 VIEW COMPARISON:  Radiographs of Nov 03, 2015. FINDINGS: Stable cardiomediastinal silhouette. No pneumothorax or pleural effusion is noted. Bony thorax is unremarkable. Sternotomy wires are noted. No acute pulmonary disease is noted. IMPRESSION: No acute cardiopulmonary abnormality seen. Electronically Signed   By: Marijo Conception, M.D.   On: 02/21/2016 18:52     ECG: NSR. Prolonged QT .412 ms. HR of 92 bpm.    Impression and Recommendations  1. Elevated troponin: Likely from demand ischemia in the setting of anemia. No acute ST T wave changes are noted on EKG. Will replace potassium as he should have a level of 4.0 with CAD.   2. Dyspnea; Likely related to anemia. Continue O2 supplement. Echo is being repeated.   3. Hx of AoV and Mitral Valve repair: Abnormal aortic valve ascultation with gallop noted. Echo will be competed this am and read urgently.   4. Hx of endocarditis: No leukocytosis, or febrile state. Will follow.   5. CAD:  Mild coronary disease per cath in 2011.   6. Anemia: On Xarelto. Discuss with Dr. Johnsie Cancel about temporary cessation in this setting.   7. History of DVT: Recurrent DVT with PE. S/P IVC filter. On Xarelto. Discuss holding with anemia.   Signed: Phill Myron. Lawrence NP [place]  02/22/2016, 8:12 AM Co-Sign MD  Patient examined chart reviewed. Clinical history still worrisome for SBE with elevated ESR, anemia and patient  Sensation of fever and chills. He is pale with SEM cannot hear AR murmur.  His last TTE was suboptimal and suspect He will need TEE. He has eaten this am and my office schedule is full this afternoon Will tentatively set up TEE  With Dr Harrington Challenger tomorrow.  Will review TTE when done today and await blood cultures  Jenkins Rouge

## 2016-02-22 NOTE — Treatment Plan (Signed)
Notified by micro that blood cx has returned gm pos in chains, consistent with strep bacteremia noted on last admit in May 2017. Discussed case with ID, Dr. Luciana Axeomer, who recommends starting rocephin and vanc empirically. TEE already planned for tomorrow. Will follow up on results.

## 2016-02-22 NOTE — Progress Notes (Signed)
CRITICAL VALUE ALERT  Critical value received:  Troponin  Date of notification:  02/22/2016  Time of notification:  0735  Critical value read back:Yes.    Nurse who received alert:  Fara ChuteMary Beth Connie Hilgert, RN  MD notified (1st page):  Dr. Rhona Leavenshiu  Time of first page:  [phone]  MD notified (2nd page):  Time of second page:  Responding MD:  Dr. Rhona Leavenshiu  Time MD responded:  [phone]  Patient's nurse also notified.

## 2016-02-22 NOTE — Care Management Note (Signed)
Case Management Note  Patient Details  Name: [name] [name] MRN: [id number] Date of Birth: 02/22/1951  Subjective/Objective:                  Pt admitted with elevated troponin and anemia. He is from home with his wife. He has PCP, transportation and no difficulty affording his medications. He uses a cane for mobility. He is on supplemental oxygen here and is not on that PTA. He has used [facility] in the past , Do not anticipate any HH needs at DC.   Action/Plan: Will cont to follow.   Expected Discharge Date:    02/23/2016              Expected Discharge Plan:  Home/Self Care  In-House Referral:  NA  Discharge planning Services  CM Consult  Post Acute Care Choice:  NA Choice offered to:  NA  DME Arranged:    DME Agency:     HH Arranged:    HH Agency:     Status of Service:  In process, will continue to follow  If discussed at Long Length of Stay Meetings, dates discussed:    Additional Comments:  Malcolm MetroChildress, Huda Petrey Demske, RN 02/22/2016, 1:05 PM

## 2016-02-22 NOTE — Progress Notes (Signed)
PROGRESS NOTE    [name] [name]  ZOX:[id number] DOB: 12/07/1950 DOA: 02/21/2016 PCP: Louie BostonAPPER,DAVID B, MD    Brief Narrative:  65 y.o. male, hypertension, hyperlipidemia, CAD, CHF (55-60%) co fatigue over the past 3 weeks,  And dyspnea over the past 1 week.  Subjective fever.  Pt thinks that his iron infusion might have had something to do with it. + generalized weakness, and loss of appetite.   Pt denies cough, cp, palp.   Assessment & Plan:   Principal Problem:   Elevated troponin I level Active Problems:   Dyspnea   S/P AVR (aortic valve replacement)   RLS (restless legs syndrome)   Type 2 diabetes mellitus with vascular disease (HCC)   Absolute anemia   Chronic anemia   1. Elevated troponin 1. Trop rising this afternoon. Suspect demand mismatch given presenting anemia 2. Cardiology consulted 3. Will order 3 more sets of trop. 4. 1 unit of prbc ordered 2. Normocytic anemia 1. Chart reviewed 2. Patient has a known prior hx of iron deficient anemia requiring IV iron replacement and blood transfusions. Patient was recommended to follow up with Hematology however pt had not done so given his other medical issues 3. Plan to follow serial hemoglobin and transfuse as needed 3. S/p AVR  1. Prior hospitalization reviewed. Pt admitted for endocarditis recently, finishing 6 weeks of abx 2. There are concerns for possible SBE with TEE planned for tomorrow 4. DM2 1. Cont on ssi coverage 5. Hx of endocarditits 1. See above. Patient with concerns of SBE 2. Input by Cardiology noted. Plan for TEE tomorrow 3. Afebrile currently with blood cx thus far neg x 2  DVT prophylaxis: xarelto Code Status: Full Family Communication: Pt in room Disposition Plan: Uncertain at this time  Consultants:   Cardiology  Procedures:     Antimicrobials: Anti-infectives    None      Subjective: Feels generally weak. States feeling mildly sob with exertion reminiscent of his iron deficiency.  Denies chest pain at present  Objective: Vitals:   02/22/16 0100 02/22/16 0500 02/22/16 0836 02/22/16 1559  BP:  130/80  (!) 119/91  Pulse: 78 90  64  Resp: 18 20  20   Temp:  98.1 F (36.7 C)  97.8 F (36.6 C)  TempSrc:  Axillary  Oral  SpO2: 96% 97% 91% 92%  Weight:      Height:        Intake/Output Summary (Last 24 hours) at 02/22/16 1633 Last data filed at 02/22/16 1624  Gross per 24 hour  Intake              633 ml  Output             2000 ml  Net            -1367 ml   Filed Weights   02/21/16 1820  Weight: 128.8 kg (284 lb)    Examination:  General exam: Appears calm and comfortable, sitting in bed  Respiratory system: Clear to auscultation. Respiratory effort normal. Cardiovascular system: S1 & S2 heard, RRR. Gastrointestinal system: Abdomen is nondistended, soft and nontender. No organomegaly or masses felt. Normal bowel sounds heard. Central nervous system: Alert and oriented. No focal neurological deficits. Extremities: Symmetric 5 x 5 power. Skin: No rashes, lesions  Psychiatry: Judgement and insight appear normal. Mood & affect appropriate.   Data Reviewed: I have personally reviewed following labs and imaging studies  CBC:  Recent Labs Lab 02/21/16 1929 02/22/16 0534  WBC 7.0 8.9  NEUTROABS 5.1  --   HGB 8.5* 8.1*  HCT 28.6* 27.0*  MCV 79.2 78.9  PLT 164 178   Basic Metabolic Panel:  Recent Labs Lab 02/21/16 1929 02/21/16 1958 02/22/16 0534  NA 133*  --  134*  K 3.5  --  3.6  CL 101  --  104  CO2 25  --  24  GLUCOSE 151*  --  132*  BUN 17  --  15  CREATININE 1.04  --  0.91  CALCIUM 8.6*  --  8.3*  MG  --  1.7  --    GFR: Estimated Creatinine Clearance: 110.7 mL/min (by C-G formula based on SCr of 0.91 mg/dL). Liver Function Tests:  Recent Labs Lab 02/22/16 0534  AST 32  ALT 23  ALKPHOS 110  BILITOT 1.2  PROT 7.5  ALBUMIN 2.8*   No results for input(s): LIPASE, AMYLASE in the last 168 hours. No results for input(s):  AMMONIA in the last 168 hours. Coagulation Profile: No results for input(s): INR, PROTIME in the last 168 hours. Cardiac Enzymes:  Recent Labs Lab 02/21/16 1929 02/22/16 0019 02/22/16 0538 02/22/16 1211  TROPONINI 0.05* 0.05* 0.08* 0.16*   BNP (last 3 results) No results for input(s): PROBNP in the last 8760 hours. HbA1C: No results for input(s): HGBA1C in the last 72 hours. CBG: No results for input(s): GLUCAP in the last 168 hours. Lipid Profile: No results for input(s): CHOL, HDL, LDLCALC, TRIG, CHOLHDL, LDLDIRECT in the last 72 hours. Thyroid Function Tests:  Recent Labs  02/22/16 0019  TSH 1.641   Anemia Panel:  Recent Labs  02/22/16 0538  VITAMINB12 455  FERRITIN 222  TIBC 307  IRON 36*   Sepsis Labs: No results for input(s): PROCALCITON, LATICACIDVEN in the last 168 hours.  Recent Results (from the past 240 hour(s))  Blood culture (routine x 2)     Status: None (Preliminary result)   Collection Time: 02/21/16  8:20 PM  Result Value Ref Range Status   Specimen Description RIGHT ANTECUBITAL  Final   Special Requests BOTTLES DRAWN AEROBIC AND ANAEROBIC 6CC  Final   Culture NO GROWTH < 12 HOURS  Final   Report Status PENDING  Incomplete  Blood culture (routine x 2)     Status: None (Preliminary result)   Collection Time: 02/21/16  8:40 PM  Result Value Ref Range Status   Specimen Description BLOOD RIGHT FOREARM  Final   Special Requests BOTTLES DRAWN AEROBIC AND ANAEROBIC 6CC  Final   Culture PENDING  Incomplete   Report Status PENDING  Incomplete     Radiology Studies: Dg Chest 1 View  Result Date: 02/21/2016 CLINICAL DATA:  Shortness of breath. EXAM: CHEST 1 VIEW COMPARISON:  Radiographs of Nov 03, 2015. FINDINGS: Stable cardiomediastinal silhouette. No pneumothorax or pleural effusion is noted. Bony thorax is unremarkable. Sternotomy wires are noted. No acute pulmonary disease is noted. IMPRESSION: No acute cardiopulmonary abnormality seen.  Electronically Signed   By: Lupita Raider, M.D.   On: 02/21/2016 18:52   US Renal  Result Date: 02/22/2016 CLINICAL DATA:  Hematuria . EXAM: RENAL / URINARY TRACT ULTRASOUND COMPLETE COMPARISON:  None. FINDINGS: Right Kidney: Length: 13.7 cm. Echogenicity within normal limits. No mass or hydronephrosis visualized. Left Kidney: Length: 13.7 cm. Echogenicity within normal limits. No mass or hydronephrosis visualized. Tiny nonobstructing left renal stone cannot be excluded . Bladder: Appears normal for degree of bladder distention. IMPRESSION: Tiny nonobstructing left renal stone cannot be  excluded. Exam is otherwise normal. Electronically Signed   By: Maisie Fus  Register   On: 02/22/2016 10:10    Scheduled Meds: . amitriptyline  50 mg Oral QHS  . aspirin  325 mg Oral Daily  . cholecalciferol  5,000 Units Oral Daily  . diazepam  5 mg Oral QHS  . feeding supplement (ENSURE ENLIVE)  237 mL Oral BID BM  . ferrous sulfate  325 mg Oral Daily   And  . folic acid  1 mg Oral Daily   And  . vitamin C  500 mg Oral Daily  . FLUoxetine  40 mg Oral Daily  . levothyroxine  112 mcg Oral QAC breakfast  . lisinopril  10 mg Oral Daily  . metFORMIN  500 mg Oral BID WC  . metoprolol tartrate  25 mg Oral QPM  . pantoprazole  40 mg Oral Daily  . potassium chloride  20 mEq Oral Daily  . rivaroxaban  20 mg Oral Q supper  . simvastatin  40 mg Oral QHS  . sodium chloride flush  3 mL Intravenous Q12H   Continuous Infusions:    LOS: 0 days   Daisa Stennis, Scheryl Marten, MD [facility] Pager [phone]  If 7PM-7AM, please contact night-coverage [email or web address] Password [facility] 02/22/2016, 4:33 PM

## 2016-02-22 NOTE — Progress Notes (Signed)
CRITICAL VALUE ALERT  Critical value received:  Troponin  0.16  Date of notification:  02/22/2016  Time of notification:  13:39  Critical value read back:Yes.    Nurse who received alert:  Billie LadeValdese Willis Holquin  MD notified (1st page):  Dr Rhona Leavenshiu  Time of first page:  13:40 MD notified (2nd page):  Time of second page:  Responding MD:  Dr Rhona Leavenshiu  Time MD responded:  13:40

## 2016-02-22 NOTE — Telephone Encounter (Signed)
Spoke with family. He was admitted to [facility] to evaluate him for chf

## 2016-02-22 NOTE — Progress Notes (Signed)
Initial Nutrition Assessment  DOCUMENTATION CODES:  Obesity Class II   INTERVENTION:  Ensure Enlive po BID, each supplement provides 350 kcal and 20 grams of protein   Heart Healthy diet    NUTRITION DIAGNOSIS:   Inadequate oral intake related to poor appetite as evidenced by per patient/family report.  GOAL:   Patient will meet greater than or equal to 90% of their needs  MONITOR:   PO intake, Supplement acceptance, Labs, Weight trends  REASON FOR ASSESSMENT:   Malnutrition Screening Tool    ASSESSMENT: Patient presents to ED complaining of increased fatigue and shortness of breath. His troponin is elevated and he has hx of chronic anemia and CHF.  He says his appetite has been poor for the past 3 weeks. Pt says his usual (dry) weight is 310# (141 kg). His weight hx is variable according to hospital records. Pt says he does not weigh himself at home. Also, his home diet is regular.  Pt lives at home with his wife and he says she cooks regularly for them.  His diet here is Heart Healthy and pt says he ate no breakfast be cause it was cold and only 25% of his lunch meal. He is taking multiple vitamin supplements noted below.  Nutrition-Focused physical exam completed. Findings are ample subcutaneous fat, no muscle depletion, and mild LE edema.    He is at high risk for malnutrition based on his diet hx and chronic disease.    Recent Labs Lab 02/21/16 1929 02/21/16 1958 02/22/16 0534  NA 133*  --  134*  K 3.5  --  3.6  CL 101  --  104  CO2 25  --  24  BUN 17  --  15  CREATININE 1.04  --  0.91  CALCIUM 8.6*  --  8.3*  MG  --  1.7  --   GLUCOSE 151*  --  132*    Labs: sodium 134, Glucose 132.   Meds/supplements: vitamin D, vitamin C, folic acid, Iron  Diet Order:  Diet Heart Room service appropriate? Yes; Fluid consistency: Thin  Skin:  Reviewed, no issues  Last BM:  8/30   Height:   Ht Readings from Last 1 Encounters:  02/21/16 5\' 11"  (1.803 m)     Weight:   Wt Readings from Last 1 Encounters:  02/21/16 284 lb (128.8 kg)    Ideal Body Weight:  78 kg  BMI:  Body mass index is 39.61 kg/m. obesity class II  Estimated Nutritional Needs:   Kcal:  [id number]  Protein:  125-150 gr  Fluid:  1.8-1.9 liters daily   EDUCATION NEEDS:   No education needs identified at this time    Royann ShiversLynn Ameen Mostafa MS,RD,CSG,LDN Office: #[phone] Pager: [phone]

## 2016-02-23 ENCOUNTER — Encounter (HOSPITAL_COMMUNITY)
Admission: EM | Disposition: A | Discharge status: Home Health | Payer: Self-pay | Source: Home / Self Care | Attending: Internal Medicine

## 2016-02-23 ENCOUNTER — Inpatient Hospital Stay (HOSPITAL_COMMUNITY): Payer: Medicare Other

## 2016-02-23 ENCOUNTER — Encounter (HOSPITAL_COMMUNITY): Payer: Self-pay | Admitting: *Deleted

## 2016-02-23 DIAGNOSIS — Z96642 Presence of left artificial hip joint: Secondary | ICD-10-CM | POA: Diagnosis present

## 2016-02-23 DIAGNOSIS — Z954 Presence of other heart-valve replacement: Secondary | ICD-10-CM | POA: Diagnosis not present

## 2016-02-23 DIAGNOSIS — K219 Gastro-esophageal reflux disease without esophagitis: Secondary | ICD-10-CM | POA: Diagnosis present

## 2016-02-23 DIAGNOSIS — I351 Nonrheumatic aortic (valve) insufficiency: Secondary | ICD-10-CM

## 2016-02-23 DIAGNOSIS — I2510 Atherosclerotic heart disease of native coronary artery without angina pectoris: Secondary | ICD-10-CM | POA: Diagnosis present

## 2016-02-23 DIAGNOSIS — Z841 Family history of disorders of kidney and ureter: Secondary | ICD-10-CM | POA: Diagnosis not present

## 2016-02-23 DIAGNOSIS — G2581 Restless legs syndrome: Secondary | ICD-10-CM | POA: Diagnosis present

## 2016-02-23 DIAGNOSIS — Z8249 Family history of ischemic heart disease and other diseases of the circulatory system: Secondary | ICD-10-CM | POA: Diagnosis not present

## 2016-02-23 DIAGNOSIS — G4733 Obstructive sleep apnea (adult) (pediatric): Secondary | ICD-10-CM | POA: Diagnosis present

## 2016-02-23 DIAGNOSIS — Z823 Family history of stroke: Secondary | ICD-10-CM | POA: Diagnosis not present

## 2016-02-23 DIAGNOSIS — Z809 Family history of malignant neoplasm, unspecified: Secondary | ICD-10-CM | POA: Diagnosis not present

## 2016-02-23 DIAGNOSIS — I5033 Acute on chronic diastolic (congestive) heart failure: Secondary | ICD-10-CM | POA: Diagnosis present

## 2016-02-23 DIAGNOSIS — Z952 Presence of prosthetic heart valve: Secondary | ICD-10-CM | POA: Diagnosis not present

## 2016-02-23 DIAGNOSIS — I110 Hypertensive heart disease with heart failure: Secondary | ICD-10-CM | POA: Diagnosis present

## 2016-02-23 DIAGNOSIS — Z833 Family history of diabetes mellitus: Secondary | ICD-10-CM | POA: Diagnosis not present

## 2016-02-23 DIAGNOSIS — R0600 Dyspnea, unspecified: Secondary | ICD-10-CM | POA: Diagnosis present

## 2016-02-23 DIAGNOSIS — Z7901 Long term (current) use of anticoagulants: Secondary | ICD-10-CM | POA: Diagnosis not present

## 2016-02-23 DIAGNOSIS — R7989 Other specified abnormal findings of blood chemistry: Secondary | ICD-10-CM | POA: Diagnosis not present

## 2016-02-23 DIAGNOSIS — R748 Abnormal levels of other serum enzymes: Secondary | ICD-10-CM | POA: Diagnosis present

## 2016-02-23 DIAGNOSIS — Z86711 Personal history of pulmonary embolism: Secondary | ICD-10-CM | POA: Diagnosis not present

## 2016-02-23 DIAGNOSIS — E039 Hypothyroidism, unspecified: Secondary | ICD-10-CM | POA: Diagnosis present

## 2016-02-23 DIAGNOSIS — E119 Type 2 diabetes mellitus without complications: Secondary | ICD-10-CM | POA: Diagnosis present

## 2016-02-23 DIAGNOSIS — Z87891 Personal history of nicotine dependence: Secondary | ICD-10-CM | POA: Diagnosis not present

## 2016-02-23 DIAGNOSIS — D508 Other iron deficiency anemias: Secondary | ICD-10-CM | POA: Diagnosis present

## 2016-02-23 DIAGNOSIS — R7881 Bacteremia: Secondary | ICD-10-CM | POA: Diagnosis present

## 2016-02-23 DIAGNOSIS — Z86718 Personal history of other venous thrombosis and embolism: Secondary | ICD-10-CM | POA: Diagnosis not present

## 2016-02-23 DIAGNOSIS — I38 Endocarditis, valve unspecified: Secondary | ICD-10-CM | POA: Diagnosis present

## 2016-02-23 DIAGNOSIS — E785 Hyperlipidemia, unspecified: Secondary | ICD-10-CM | POA: Diagnosis present

## 2016-02-23 DIAGNOSIS — D649 Anemia, unspecified: Secondary | ICD-10-CM | POA: Diagnosis not present

## 2016-02-23 HISTORY — PX: TEE WITHOUT CARDIOVERSION: SHX5443

## 2016-02-23 LAB — BASIC METABOLIC PANEL
Anion gap: 4 — ABNORMAL LOW (ref 5–15)
BUN: 15 mg/dL (ref 6–20)
CALCIUM: 7.8 mg/dL — AB (ref 8.9–10.3)
CHLORIDE: 103 mmol/L (ref 101–111)
CO2: 23 mmol/L (ref 22–32)
CREATININE: 0.92 mg/dL (ref 0.61–1.24)
GFR calc Af Amer: >60 mL/min (ref >60)
GFR calc non Af Amer: >60 mL/min (ref >60)
GLUCOSE: 131 mg/dL — AB (ref 65–99)
Potassium: 3.7 mmol/L (ref 3.5–5.1)
Sodium: 130 mmol/L — ABNORMAL LOW (ref 135–145)

## 2016-02-23 LAB — FOLATE RBC
Folate, Hemolysate: 446.7 ng/mL
Folate, RBC: 1731 ng/mL (ref >498)
Hematocrit: 25.8 % — ABNORMAL LOW (ref 37.5–51.0)

## 2016-02-23 LAB — TYPE AND SCREEN
ABO/RH(D): A POS
Antibody Screen: NEGATIVE
UNIT DIVISION: 0

## 2016-02-23 LAB — GLUCOSE, CAPILLARY
GLUCOSE-CAPILLARY: 135 mg/dL — AB (ref 65–99)
Glucose-Capillary: 129 mg/dL — ABNORMAL HIGH (ref 65–99)
Glucose-Capillary: 129 mg/dL — ABNORMAL HIGH (ref 65–99)
Glucose-Capillary: 138 mg/dL — ABNORMAL HIGH (ref 65–99)

## 2016-02-23 LAB — CBC
HCT: 27.5 % — ABNORMAL LOW (ref 39.0–52.0)
Hemoglobin: 8.4 g/dL — ABNORMAL LOW (ref 13.0–17.0)
MCH: 23.9 pg — AB (ref 26.0–34.0)
MCHC: 30.5 g/dL (ref 30.0–36.0)
MCV: 78.3 fL (ref 78.0–100.0)
PLATELETS: 160 10*3/uL (ref 150–400)
RBC: 3.51 MIL/uL — ABNORMAL LOW (ref 4.22–5.81)
RDW: 20.3 % — ABNORMAL HIGH (ref 11.5–15.5)
WBC: 7.3 10*3/uL (ref 4.0–10.5)

## 2016-02-23 LAB — TROPONIN I: TROPONIN I: 0.13 ng/mL — AB (ref <0.03)

## 2016-02-23 SURGERY — ECHOCARDIOGRAM, TRANSESOPHAGEAL
Anesthesia: Moderate Sedation

## 2016-02-23 MED ORDER — FENTANYL CITRATE (PF) 100 MCG/2ML IJ SOLN
INTRAMUSCULAR | Status: DC | PRN
  Administered 2016-02-23: 25 ug via INTRAVENOUS

## 2016-02-23 MED ORDER — LIDOCAINE VISCOUS 2 % MT SOLN
OROMUCOSAL | Status: DC | PRN
  Administered 2016-02-23: 5 mL via OROMUCOSAL

## 2016-02-23 MED ORDER — MIDAZOLAM HCL 5 MG/5ML IJ SOLN
INTRAMUSCULAR | Status: DC | PRN
  Administered 2016-02-23 (×2): 1 mg via INTRAVENOUS
  Administered 2016-02-23: 2 mg via INTRAVENOUS

## 2016-02-23 MED ORDER — SODIUM CHLORIDE BACTERIOSTATIC 0.9 % IJ SOLN
INTRAMUSCULAR | Status: AC
  Filled 2016-02-23: qty 20

## 2016-02-23 MED ORDER — FUROSEMIDE 10 MG/ML IJ SOLN
40.0000 mg | Freq: Once | INTRAMUSCULAR | Status: AC
  Administered 2016-02-23: 40 mg via INTRAVENOUS
  Filled 2016-02-23: qty 4

## 2016-02-23 MED ORDER — FENTANYL CITRATE (PF) 100 MCG/2ML IJ SOLN
INTRAMUSCULAR | Status: AC
  Filled 2016-02-23: qty 4

## 2016-02-23 MED ORDER — LIDOCAINE VISCOUS 2 % MT SOLN
OROMUCOSAL | Status: AC
  Filled 2016-02-23: qty 15

## 2016-02-23 MED ORDER — BUTAMBEN-TETRACAINE-BENZOCAINE 2-2-14 % EX AERO
INHALATION_SPRAY | CUTANEOUS | Status: AC
  Filled 2016-02-23: qty 20

## 2016-02-23 MED ORDER — SODIUM CHLORIDE 0.9 % IV SOLN
INTRAVENOUS | Status: DC
  Administered 2016-02-23: 1000 mL via INTRAVENOUS

## 2016-02-23 MED ORDER — MIDAZOLAM HCL 5 MG/5ML IJ SOLN
INTRAMUSCULAR | Status: AC
  Filled 2016-02-23: qty 10

## 2016-02-23 NOTE — Op Note (Signed)
TV normal  AV prosthesis is thickened, calcified with some restricted motion.  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  Mild MR  No discrete vegetation PV is difficult to see  LA, LAA without masses LVEF is grossly normal No PFO by color doppler only   Mild fixed plaquing on thoracic aorta  Will compare to TEE images from May 2017    Procedure without complication Full report to follow

## 2016-02-23 NOTE — Progress Notes (Signed)
PROGRESS NOTE    [name] [name]  ZOX:[id number] DOB: Mar 09, 1951 DOA: 02/21/2016 PCP: Louie Boston, MD    Brief Narrative:  65 y.o. male, hypertension, hyperlipidemia, CAD, CHF (55-60%) co fatigue over the past 3 weeks,  And dyspnea over the past 1 week.  Subjective fever.  Pt thinks that his iron infusion might have had something to do with it. + generalized weakness, and loss of appetite.   Pt denies cough, cp, palp.   Assessment & Plan:   Principal Problem:   Elevated troponin I level Active Problems:   Dyspnea   S/P AVR (aortic valve replacement)   RLS (restless legs syndrome)   Type 2 diabetes mellitus with vascular disease (HCC)   Absolute anemia   Chronic anemia   1. Elevated troponin 1. Troponin initially rose to a peak of 0.18, now trending down following blood transfusion 2. Cardiology consulted 3. Suspect demand mismatch. Continue to transfuse as needed 2. Chronic iron deficiency anemia 1. Chart reviewed 2. Patient has a known prior hx of iron deficient anemia requiring IV iron replacement and blood transfusions. Patient was recommended to follow up with Hematology however pt had not done so given his other medical issues 3. She given 1 unit of PRBCs on 02/22/2016 4. Iron mildly low at 36. Will continue with iron supplementation orally 3. S/p AVR  1. Prior hospitalization reviewed. Pt admitted for strep bacteremia recently, finishing 6 weeks of abx 2. There were concerns for possible SBE this admission 3. Patient is now status post TEE with no evidence of valvular vegetations 4. DM2 1. Cont on ssi coverage 5. Hx of endocarditits 1. Input by Cardiology noted and appreciated 6. Bacteremia with gram-positive cocci and chains 1. Follow-up on speciation and sensitivities 2. Prior documentation of recent hospital admission reviewed 3. Patient was discharged on 11/08/2015 with granulicatella adiacens bacteremia, typically associated with poor dentition. Patient had  finished 6 weeks of IV Rocephin. 4. On that admission, patient was found to have multiple dental caries. Patient was recommended to follow-up with his primary dentist in [facility]. Unfortunately, patient has yet to follow-up with his dentist. 5. Patient again reminded to follow-up with his dentist, suspect would require dental extraction given recurrent bacteremia. 6. Once speciation is finalized, would discuss treatment plan with infectious disease for antibiotic recommendations  DVT prophylaxis: xarelto Code Status: Full Family Communication: Pt in room Disposition Plan: Uncertain at this time  Consultants:   Cardiology  Procedures:   Transthoracic echocardiogram on 02/22/2016  Transesophageal echocardiogram on 02/23/2016  Antimicrobials: Anti-infectives    Start     Dose/Rate Route Frequency Ordered Stop   02/23/16 1800  cefTRIAXone (ROCEPHIN) 2 g in dextrose 5 % 50 mL IVPB     2 g 100 mL/hr over 30 Minutes Intravenous Every 24 hours 02/22/16 1821     02/23/16 0630  vancomycin (VANCOCIN) 1,250 mg in sodium chloride 0.9 % 250 mL IVPB     1,250 mg 166.7 mL/hr over 90 Minutes Intravenous Every 12 hours 02/22/16 1821     02/22/16 1830  vancomycin (VANCOCIN) 2,500 mg in sodium chloride 0.9 % 500 mL IVPB     2,500 mg 250 mL/hr over 120 Minutes Intravenous  Once 02/22/16 1751 02/22/16 2142   02/22/16 1800  cefTRIAXone (ROCEPHIN) 2 g in dextrose 5 % 50 mL IVPB  Status:  Discontinued     2 g 100 mL/hr over 30 Minutes Intravenous Every 24 hours 02/22/16 1749 02/23/16 1334  Subjective: No complaints today. Patient states he feels better after receiving 1 unit of blood yesterday.  Objective: Vitals:   02/23/16 1215 02/23/16 1220 02/23/16 1225 02/23/16 1248  BP: 108/61 120/68 121/88 106/70  Pulse: (!) 50 (!) 51 (!) 51 (!) 50  Resp: (!) 24 (!) 25 (!) 21 17  Temp:    98.2 F (36.8 C)  TempSrc:    Oral  SpO2: 95% 95% 97% 98%  Weight:      Height:         Intake/Output Summary (Last 24 hours) at 02/23/16 1445 Last data filed at 02/23/16 1325  Gross per 24 hour  Intake          1081.33 ml  Output             1100 ml  Net           -18.67 ml   Filed Weights   02/21/16 1820 02/23/16 0538  Weight: 128.8 kg (284 lb) 122.8 kg (270 lb 11.6 oz)    Examination:  General exam: Appears calm and comfortable, Lying in bed  Respiratory system: Clear to auscultation. Respiratory effort normal. Cardiovascular system: S1 & S2 heard, RRR. Gastrointestinal system: Abdomen is nondistended, soft and nontender. No organomegaly or masses felt. Normal bowel sounds heard. Central nervous system: Alert and oriented. No focal neurological deficits. Extremities: Symmetric 5 x 5 power. Skin: No rashes, lesions  Psychiatry: Judgement and insight appear normal. Mood & affect appropriate.   Data Reviewed: I have personally reviewed following labs and imaging studies  CBC:  Recent Labs Lab 02/21/16 1929 02/22/16 0534 02/22/16 0538 02/22/16 2021 02/23/16 0435  WBC 7.0 8.9  --   --  7.3  NEUTROABS 5.1  --   --   --   --   HGB 8.5* 8.1*  --  9.0* 8.4*  HCT 28.6* 27.0* 25.8* 29.6* 27.5*  MCV 79.2 78.9  --   --  78.3  PLT 164 178  --   --  160   Basic Metabolic Panel:  Recent Labs Lab 02/21/16 1929 02/21/16 1958 02/22/16 0534 02/23/16 0435  NA 133*  --  134* 130*  K 3.5  --  3.6 3.7  CL 101  --  104 103  CO2 25  --  24 23  GLUCOSE 151*  --  132* 131*  BUN 17  --  15 15  CREATININE 1.04  --  0.91 0.92  CALCIUM 8.6*  --  8.3* 7.8*  MG  --  1.7  --   --    GFR: Estimated Creatinine Clearance: 106.8 mL/min (by C-G formula based on SCr of 0.92 mg/dL). Liver Function Tests:  Recent Labs Lab 02/22/16 0534  AST 32  ALT 23  ALKPHOS 110  BILITOT 1.2  PROT 7.5  ALBUMIN 2.8*   No results for input(s): LIPASE, AMYLASE in the last 168 hours. No results for input(s): AMMONIA in the last 168 hours. Coagulation Profile: No results for  input(s): INR, PROTIME in the last 168 hours. Cardiac Enzymes:  Recent Labs Lab 02/22/16 0538 02/22/16 1211 02/22/16 1405 02/22/16 2021 02/23/16 0435  TROPONINI 0.08* 0.16* 0.18* 0.16* 0.13*   BNP (last 3 results) No results for input(s): PROBNP in the last 8760 hours. HbA1C: No results for input(s): HGBA1C in the last 72 hours. CBG:  Recent Labs Lab 02/22/16 1759 02/22/16 2039 02/23/16 0811 02/23/16 1129  GLUCAP 115* 156* 129* 135*   Lipid Profile: No results for input(s): CHOL, HDL, LDLCALC,  TRIG, CHOLHDL, LDLDIRECT in the last 72 hours. Thyroid Function Tests:  Recent Labs  02/22/16 0019  TSH 1.641   Anemia Panel:  Recent Labs  02/22/16 0538  VITAMINB12 455  FERRITIN 222  TIBC 307  IRON 36*   Sepsis Labs: No results for input(s): PROCALCITON, LATICACIDVEN in the last 168 hours.  Recent Results (from the past 240 hour(s))  Blood culture (routine x 2)     Status: None (Preliminary result)   Collection Time: 02/21/16  8:20 PM  Result Value Ref Range Status   Specimen Description RIGHT ANTECUBITAL  Final   Special Requests BOTTLES DRAWN AEROBIC AND ANAEROBIC 6CC  Final   Culture  Setup Time   Final    GRAM POSITIVE COCCI IN CHAINS RECOVERED FROM BOTH AEROBIC AND ANAEROBIC BOTTLES Gram Stain Report Called to,Read Back By and Verified With: COVINTGTON,L. AT 1712 ON 02/22/2016 BY BAUGHAM,M. Performed at [facility]    Culture   Final    CULTURE REINCUBATED FOR BETTER GROWTH Performed at [facility]    Report Status PENDING  Incomplete  Blood culture (routine x 2)     Status: None (Preliminary result)   Collection Time: 02/21/16  8:40 PM  Result Value Ref Range Status   Specimen Description BLOOD RIGHT FOREARM  Final   Special Requests BOTTLES DRAWN AEROBIC AND ANAEROBIC 6CC  Final   Culture  Setup Time   Final    GRAM POSITIVE COCCI IN CHAINS RECOVERED FROM BOTH THE AEROBIC AND ANAEROBIC BOTTLES. Gram Stain Report Called to,Read Back  By and Verified With: COVINGTON,L. AT 1712 ON 02/22/2016 BY BAUGHAM,M. Performed at [facility]    Culture   Final    CULTURE REINCUBATED FOR BETTER GROWTH Performed at [facility]    Report Status PENDING  Incomplete  Blood Culture ID Panel (Reflexed)     Status: None   Collection Time: 02/21/16  8:40 PM  Result Value Ref Range Status   Enterococcus species NOT DETECTED NOT DETECTED Final   Vancomycin resistance NOT DETECTED NOT DETECTED Final   Listeria monocytogenes NOT DETECTED NOT DETECTED Final   Staphylococcus species NOT DETECTED NOT DETECTED Final   Staphylococcus aureus NOT DETECTED NOT DETECTED Final   Methicillin resistance NOT DETECTED NOT DETECTED Final   Streptococcus species NOT DETECTED NOT DETECTED Final   Streptococcus agalactiae NOT DETECTED NOT DETECTED Final   Streptococcus pneumoniae NOT DETECTED NOT DETECTED Final   Streptococcus pyogenes NOT DETECTED NOT DETECTED Final   Acinetobacter baumannii NOT DETECTED NOT DETECTED Final   Enterobacteriaceae species NOT DETECTED NOT DETECTED Final   Enterobacter cloacae complex NOT DETECTED NOT DETECTED Final   Escherichia coli NOT DETECTED NOT DETECTED Final   Klebsiella oxytoca NOT DETECTED NOT DETECTED Final   Klebsiella pneumoniae NOT DETECTED NOT DETECTED Final   Proteus species NOT DETECTED NOT DETECTED Final   Serratia marcescens NOT DETECTED NOT DETECTED Final   Carbapenem resistance NOT DETECTED NOT DETECTED Final   Haemophilus influenzae NOT DETECTED NOT DETECTED Final   Neisseria meningitidis NOT DETECTED NOT DETECTED Final   Pseudomonas aeruginosa NOT DETECTED NOT DETECTED Final   Candida albicans NOT DETECTED NOT DETECTED Final   Candida glabrata NOT DETECTED NOT DETECTED Final   Candida krusei NOT DETECTED NOT DETECTED Final   Candida parapsilosis NOT DETECTED NOT DETECTED Final   Candida tropicalis NOT DETECTED NOT DETECTED Final    Comment: Performed at [facility]      Radiology Studies: Dg Chest  1 View  Result Date: 02/21/2016 CLINICAL DATA:  Shortness of breath. EXAM: CHEST 1 VIEW COMPARISON:  Radiographs of Nov 03, 2015. FINDINGS: Stable cardiomediastinal silhouette. No pneumothorax or pleural effusion is noted. Bony thorax is unremarkable. Sternotomy wires are noted. No acute pulmonary disease is noted. IMPRESSION: No acute cardiopulmonary abnormality seen. Electronically Signed   By: Lupita RaiderJames  Green Jr, M.D.   On: 02/21/2016 18:52   [place] Renal  Result Date: 02/22/2016 CLINICAL DATA:  Hematuria . EXAM: RENAL / URINARY TRACT ULTRASOUND COMPLETE COMPARISON:  None. FINDINGS: Right Kidney: Length: 13.7 cm. Echogenicity within normal limits. No mass or hydronephrosis visualized. Left Kidney: Length: 13.7 cm. Echogenicity within normal limits. No mass or hydronephrosis visualized. Tiny nonobstructing left renal stone cannot be excluded . Bladder: Appears normal for degree of bladder distention. IMPRESSION: Tiny nonobstructing left renal stone cannot be excluded. Exam is otherwise normal. Electronically Signed   By: Maisie Fushomas  Register   On: 02/22/2016 10:10    Scheduled Meds: . amitriptyline  50 mg Oral QHS  . aspirin  325 mg Oral Daily  . cefTRIAXone (ROCEPHIN)  IV  2 g Intravenous Q24H  . cholecalciferol  5,000 Units Oral Daily  . diazepam  5 mg Oral QHS  . feeding supplement (ENSURE ENLIVE)  237 mL Oral BID BM  . ferrous sulfate  325 mg Oral Daily   And  . folic acid  1 mg Oral Daily   And  . vitamin C  500 mg Oral Daily  . FLUoxetine  40 mg Oral Daily  . insulin aspart  0-15 Units Subcutaneous TID WC  . insulin aspart  0-5 Units Subcutaneous QHS  . levothyroxine  112 mcg Oral QAC breakfast  . lisinopril  10 mg Oral Daily  . metFORMIN  500 mg Oral BID WC  . metoprolol tartrate  25 mg Oral QPM  . pantoprazole  40 mg Oral Daily  . potassium chloride  20 mEq Oral Daily  . rivaroxaban  20 mg Oral Q supper  . simvastatin  40 mg Oral QHS  . sodium chloride  flush  3 mL Intravenous Q12H  . vancomycin  1,250 mg Intravenous Q12H   Continuous Infusions:    LOS: 0 days   Ardon Franklin, Scheryl MartenSTEPHEN K, MD [facility] Pager [phone]  If 7PM-7AM, please contact night-coverage [email or web address] Password [email or web address] 02/23/2016, 2:45 PM

## 2016-02-23 NOTE — Interval H&P Note (Signed)
History and Physical Interval Note:  02/23/2016 11:38 AM  Lysle PearlSteven C Nunes  has presented today for surgery, with the diagnosis of eval for vegetation  The various methods of treatment have been discussed with the patient and family. After consideration of risks, benefits and other options for treatment, the patient has consented to  Procedure(s): TRANSESOPHAGEAL ECHOCARDIOGRAM (TEE) (N/A) as a surgical intervention .  The patient's history has been reviewed, patient examined, no change in status, stable for surgery.  I have reviewed the patient's chart and labs.  Questions were answered to the patient's satisfaction.     Dietrich PatesPaula Harris [place]

## 2016-02-23 NOTE — H&P (View-Only) (Signed)
Primary Cardiologist: Jenkins Rouge MD  Cardiology Specific Problem List: 1. Endocarditis 2. CAD 3. AoV and MV repair 4. Hx of DVT with PE   Subjective:    Patient to have TEE today with Dr. Harrington Challenger at 11:30 am. He states he is concerned about this as he had a difficult TEE at [facility] with excessive choking an difficult intubation.   Objective:   Temp:  [97 F (36.1 C)-97.9 F (36.6 C)] 97 F (36.1 C) (09/01 0538) Pulse Rate:  [43-98] 64 (09/01 0816) Resp:  [18-20] 20 (09/01 0538) BP: (110-145)/(61-101) 110/61 (09/01 0538) SpO2:  [91 %-98 %] 94 % (09/01 0816) Weight:  [270 lb 11.6 oz (122.8 kg)] 270 lb 11.6 oz (122.8 kg) (09/01 0538) Last BM Date: 02/21/16  Filed Weights   02/21/16 1820 02/23/16 0538  Weight: 284 lb (128.8 kg) 270 lb 11.6 oz (122.8 kg)    Intake/Output Summary (Last 24 hours) at 02/23/16 0851 Last data filed at 02/23/16 0634  Gross per 24 hour  Intake             1230 ml  Output             2000 ml  Net             -770 ml    Telemetry:  ST with frequent PAC's Rates close to 100's.   Exam:  General: No acute distress.  HEENT: Conjunctiva and lids normal, oropharynx clear.  Lungs: Clear to auscultation, nonlabored.  Cardiac: No elevated JVP or bruits. RRR, with 2/6 systolic murmur at the AoV RSB. Tachycardic  Abdomen: Normoactive bowel sounds, nontender, nondistended.  Extremities: No pitting edema, distal pulses full.  Neuropsychiatric: Alert and oriented x3, affect appropriate.   Lab Results:  Basic Metabolic Panel:  Recent Labs Lab 02/21/16 1929 02/21/16 1958 02/22/16 0534 02/23/16 0435  NA 133*  --  134* 130*  K 3.5  --  3.6 3.7  CL 101  --  104 103  CO2 25  --  24 23  GLUCOSE 151*  --  132* 131*  BUN 17  --  15 15  CREATININE 1.04  --  0.91 0.92  CALCIUM 8.6*  --  8.3* 7.8*  MG  --  1.7  --   --     Liver Function Tests:  Recent Labs Lab 02/22/16 0534  AST 32  ALT 23  ALKPHOS 110  BILITOT 1.2  PROT 7.5    ALBUMIN 2.8*    CBC:  Recent Labs Lab 02/21/16 1929 02/22/16 0534 02/22/16 2021 02/23/16 0435  WBC 7.0 8.9  --  7.3  HGB 8.5* 8.1* 9.0* 8.4*  HCT 28.6* 27.0* 29.6* 27.5*  MCV 79.2 78.9  --  78.3  PLT 164 178  --  160    Cardiac Enzymes:  Recent Labs Lab 02/22/16 1405 02/22/16 2021 02/23/16 0435  TROPONINI 0.18* 0.16* 0.13*    Radiology: Dg Chest 1 View  Result Date: 02/21/2016 CLINICAL DATA:  Shortness of breath. EXAM: CHEST 1 VIEW COMPARISON:  Radiographs of Nov 03, 2015. FINDINGS: Stable cardiomediastinal silhouette. No pneumothorax or pleural effusion is noted. Bony thorax is unremarkable. Sternotomy wires are noted. No acute pulmonary disease is noted. IMPRESSION: No acute cardiopulmonary abnormality seen. Electronically Signed   By: Marijo Conception, M.D.   On: 02/21/2016 18:52   US Renal  Result Date: 02/22/2016 CLINICAL DATA:  Hematuria . EXAM: RENAL / URINARY TRACT ULTRASOUND COMPLETE COMPARISON:  None. FINDINGS: Right Kidney:  Length: 13.7 cm. Echogenicity within normal limits. No mass or hydronephrosis visualized. Left Kidney: Length: 13.7 cm. Echogenicity within normal limits. No mass or hydronephrosis visualized. Tiny nonobstructing left renal stone cannot be excluded . Bladder: Appears normal for degree of bladder distention. IMPRESSION: Tiny nonobstructing left renal stone cannot be excluded. Exam is otherwise normal. Electronically Signed   By: Marcello Moores  Register   On: 02/22/2016 10:10   Echocardiogram 02/22/2016 Left ventricle: Abnormal septal motion Wall thickness was   increased in a pattern of moderate LVH. Systolic function was   normal. The estimated ejection fraction was in the range of 50%   to 55%. The study is not technically sufficient to allow   evaluation of LV diastolic function. - Aortic valve: Tissue AVR with calcified right cusp. Gradients   more elevated than before ? due to anemia   No AR or perivalvualr leak no vegetations. Valve area  (VTI): 1.7   cm^2. Valve area (Vmax): 1.28 cm^2. Valve area (Vmean): 1.54   cm^2. - Mitral valve: Post repair with annuloplasty ring trivial residual   MR no vegetation - Left atrium: The atrium was moderately dilated. - Atrial septum: No defect or patent foramen ovale was identified. - Pulmonary arteries: PA peak pressure: 87 mm Hg (S). - Impressions: Due to strong clinical suspicion with ESR 70 anemia   fever and chills patient   scheduled to have TEE with Dr Harrington Challenger tomorrow to r/o SBE   recurrence. Due to strong clinical suspicion with ESR 70 anemia fever and   chills patient   scheduled to have TEE with Dr Harrington Challenger tomorrow to r/o SBE   recurrence. There was no evidence of a vegetation   Medications:   Scheduled Medications: . amitriptyline  50 mg Oral QHS  . aspirin  325 mg Oral Daily  . cefTRIAXone (ROCEPHIN)  IV  2 g Intravenous Q24H  . cefTRIAXone (ROCEPHIN)  IV  2 g Intravenous Q24H  . cholecalciferol  5,000 Units Oral Daily  . diazepam  5 mg Oral QHS  . feeding supplement (ENSURE ENLIVE)  237 mL Oral BID BM  . ferrous sulfate  325 mg Oral Daily   And  . folic acid  1 mg Oral Daily   And  . vitamin C  500 mg Oral Daily  . FLUoxetine  40 mg Oral Daily  . insulin aspart  0-15 Units Subcutaneous TID WC  . insulin aspart  0-5 Units Subcutaneous QHS  . levothyroxine  112 mcg Oral QAC breakfast  . lisinopril  10 mg Oral Daily  . metFORMIN  500 mg Oral BID WC  . metoprolol tartrate  25 mg Oral QPM  . pantoprazole  40 mg Oral Daily  . potassium chloride  20 mEq Oral Daily  . rivaroxaban  20 mg Oral Q supper  . simvastatin  40 mg Oral QHS  . sodium chloride flush  3 mL Intravenous Q12H  . vancomycin  1,250 mg Intravenous Q12H       PRN Medications: sodium chloride, acetaminophen **OR** acetaminophen, HYDROcodone-acetaminophen, sodium chloride flush   Assessment and Plan:   1. 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 He is being started on Vancomycin. TEE is scheduled for 11:30 am with Dr. Harrington Challenger. He states that he had trouble with last TEE that caused difficultly with passing scope. This will be discussed with Dr. Harrington Challenger.  2. CAD: Mild coronary artery disease  3. Hx of AoV  and Mitral Valve repair:  Echo did not reveal vegetation on prosthetic valve. TEE will be more determinant.   4. Hx of DVT and PE: On Xarelto. He is anemic. Continue anticoagulation for now as he does not have active bleeding.   Phill Myron. Lawrence NP [place]  02/23/2016, 8:51 AM   Pt seen and examiend  I agree with findings as noted above by Arnold Long ON exam Lungs are rel clear  CardiaC  RRR with freq skips  Gr III/VI systolic murmur   Ext without signif edema    Plan for TEE today to reeval valves.  Pt had TEE in May 2017  No difficulties  No veg at the time    Continue on Xarelto for PE.  Dorris Carnes

## 2016-02-23 NOTE — Progress Notes (Signed)
*  PRELIMINARY RESULTS* Echocardiogram Echocardiogram Transesophageal has been performed.  Stacey DrainWhite, Kashon Kraynak J 02/23/2016, 1:19 PM

## 2016-02-23 NOTE — Progress Notes (Signed)
Primary Cardiologist: Jenkins Rouge MD  Cardiology Specific Problem List: 1. Endocarditis 2. CAD 3. AoV and MV repair 4. Hx of DVT with PE   Subjective:    Patient to have TEE today with Dr. Harrington Challenger at 11:30 am. He states he is concerned about this as he had a difficult TEE at [facility] with excessive choking an difficult intubation.   Objective:   Temp:  [97 F (36.1 C)-97.9 F (36.6 C)] 97 F (36.1 C) (09/01 0538) Pulse Rate:  [43-98] 64 (09/01 0816) Resp:  [18-20] 20 (09/01 0538) BP: (110-145)/(61-101) 110/61 (09/01 0538) SpO2:  [91 %-98 %] 94 % (09/01 0816) Weight:  [270 lb 11.6 oz (122.8 kg)] 270 lb 11.6 oz (122.8 kg) (09/01 0538) Last BM Date: 02/21/16  Filed Weights   02/21/16 1820 02/23/16 0538  Weight: 284 lb (128.8 kg) 270 lb 11.6 oz (122.8 kg)    Intake/Output Summary (Last 24 hours) at 02/23/16 0851 Last data filed at 02/23/16 0634  Gross per 24 hour  Intake             1230 ml  Output             2000 ml  Net             -770 ml    Telemetry:  ST with frequent PAC's Rates close to 100's.   Exam:  General: No acute distress.  HEENT: Conjunctiva and lids normal, oropharynx clear.  Lungs: Clear to auscultation, nonlabored.  Cardiac: No elevated JVP or bruits. RRR, with 2/6 systolic murmur at the AoV RSB. Tachycardic  Abdomen: Normoactive bowel sounds, nontender, nondistended.  Extremities: No pitting edema, distal pulses full.  Neuropsychiatric: Alert and oriented x3, affect appropriate.   Lab Results:  Basic Metabolic Panel:  Recent Labs Lab 02/21/16 1929 02/21/16 1958 02/22/16 0534 02/23/16 0435  NA 133*  --  134* 130*  K 3.5  --  3.6 3.7  CL 101  --  104 103  CO2 25  --  24 23  GLUCOSE 151*  --  132* 131*  BUN 17  --  15 15  CREATININE 1.04  --  0.91 0.92  CALCIUM 8.6*  --  8.3* 7.8*  MG  --  1.7  --   --     Liver Function Tests:  Recent Labs Lab 02/22/16 0534  AST 32  ALT 23  ALKPHOS 110  BILITOT 1.2  PROT 7.5    ALBUMIN 2.8*    CBC:  Recent Labs Lab 02/21/16 1929 02/22/16 0534 02/22/16 2021 02/23/16 0435  WBC 7.0 8.9  --  7.3  HGB 8.5* 8.1* 9.0* 8.4*  HCT 28.6* 27.0* 29.6* 27.5*  MCV 79.2 78.9  --  78.3  PLT 164 178  --  160    Cardiac Enzymes:  Recent Labs Lab 02/22/16 1405 02/22/16 2021 02/23/16 0435  TROPONINI 0.18* 0.16* 0.13*    Radiology: Dg Chest 1 View  Result Date: 02/21/2016 CLINICAL DATA:  Shortness of breath. EXAM: CHEST 1 VIEW COMPARISON:  Radiographs of Nov 03, 2015. FINDINGS: Stable cardiomediastinal silhouette. No pneumothorax or pleural effusion is noted. Bony thorax is unremarkable. Sternotomy wires are noted. No acute pulmonary disease is noted. IMPRESSION: No acute cardiopulmonary abnormality seen. Electronically Signed   By: Marijo Conception, M.D.   On: 02/21/2016 18:52   US Renal  Result Date: 02/22/2016 CLINICAL DATA:  Hematuria . EXAM: RENAL / URINARY TRACT ULTRASOUND COMPLETE COMPARISON:  None. FINDINGS: Right Kidney:  Length: 13.7 cm. Echogenicity within normal limits. No mass or hydronephrosis visualized. Left Kidney: Length: 13.7 cm. Echogenicity within normal limits. No mass or hydronephrosis visualized. Tiny nonobstructing left renal stone cannot be excluded . Bladder: Appears normal for degree of bladder distention. IMPRESSION: Tiny nonobstructing left renal stone cannot be excluded. Exam is otherwise normal. Electronically Signed   By: Marcello Moores  Register   On: 02/22/2016 10:10   Echocardiogram 02/22/2016 Left ventricle: Abnormal septal motion Wall thickness was   increased in a pattern of moderate LVH. Systolic function was   normal. The estimated ejection fraction was in the range of 50%   to 55%. The study is not technically sufficient to allow   evaluation of LV diastolic function. - Aortic valve: Tissue AVR with calcified right cusp. Gradients   more elevated than before ? due to anemia   No AR or perivalvualr leak no vegetations. Valve area  (VTI): 1.7   cm^2. Valve area (Vmax): 1.28 cm^2. Valve area (Vmean): 1.54   cm^2. - Mitral valve: Post repair with annuloplasty ring trivial residual   MR no vegetation - Left atrium: The atrium was moderately dilated. - Atrial septum: No defect or patent foramen ovale was identified. - Pulmonary arteries: PA peak pressure: 87 mm Hg (S). - Impressions: Due to strong clinical suspicion with ESR 70 anemia   fever and chills patient   scheduled to have TEE with Dr Harrington Challenger tomorrow to r/o SBE   recurrence. Due to strong clinical suspicion with ESR 70 anemia fever and   chills patient   scheduled to have TEE with Dr Harrington Challenger tomorrow to r/o SBE   recurrence. There was no evidence of a vegetation   Medications:   Scheduled Medications: . amitriptyline  50 mg Oral QHS  . aspirin  325 mg Oral Daily  . cefTRIAXone (ROCEPHIN)  IV  2 g Intravenous Q24H  . cefTRIAXone (ROCEPHIN)  IV  2 g Intravenous Q24H  . cholecalciferol  5,000 Units Oral Daily  . diazepam  5 mg Oral QHS  . feeding supplement (ENSURE ENLIVE)  237 mL Oral BID BM  . ferrous sulfate  325 mg Oral Daily   And  . folic acid  1 mg Oral Daily   And  . vitamin C  500 mg Oral Daily  . FLUoxetine  40 mg Oral Daily  . insulin aspart  0-15 Units Subcutaneous TID WC  . insulin aspart  0-5 Units Subcutaneous QHS  . levothyroxine  112 mcg Oral QAC breakfast  . lisinopril  10 mg Oral Daily  . metFORMIN  500 mg Oral BID WC  . metoprolol tartrate  25 mg Oral QPM  . pantoprazole  40 mg Oral Daily  . potassium chloride  20 mEq Oral Daily  . rivaroxaban  20 mg Oral Q supper  . simvastatin  40 mg Oral QHS  . sodium chloride flush  3 mL Intravenous Q12H  . vancomycin  1,250 mg Intravenous Q12H       PRN Medications: sodium chloride, acetaminophen **OR** acetaminophen, HYDROcodone-acetaminophen, sodium chloride flush   Assessment and Plan:   1. 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 He is being started on Vancomycin. TEE is scheduled for 11:30 am with Dr. Harrington Challenger. He states that he had trouble with last TEE that caused difficultly with passing scope. This will be discussed with Dr. Harrington Challenger.  2. CAD: Mild coronary artery disease  3. Hx of AoV  and Mitral Valve repair:  Echo did not reveal vegetation on prosthetic valve. TEE will be more determinant.   4. Hx of DVT and PE: On Xarelto. He is anemic. Continue anticoagulation for now as he does not have active bleeding.   Phill Myron. Lawrence NP [place]  02/23/2016, 8:51 AM   Pt seen and examiend  I agree with findings as noted above by Arnold Long ON exam Lungs are rel clear  CardiaC  RRR with freq skips  Gr III/VI systolic murmur   Ext without signif edema    Plan for TEE today to reeval valves.  Pt had TEE in May 2017  No difficulties  No veg at the time    Continue on Xarelto for PE.  Dorris Carnes

## 2016-02-24 DIAGNOSIS — I5033 Acute on chronic diastolic (congestive) heart failure: Secondary | ICD-10-CM

## 2016-02-24 DIAGNOSIS — E1159 Type 2 diabetes mellitus with other circulatory complications: Secondary | ICD-10-CM

## 2016-02-24 DIAGNOSIS — Z954 Presence of other heart-valve replacement: Secondary | ICD-10-CM

## 2016-02-24 DIAGNOSIS — D508 Other iron deficiency anemias: Principal | ICD-10-CM

## 2016-02-24 LAB — GLUCOSE, CAPILLARY
GLUCOSE-CAPILLARY: 105 mg/dL — AB (ref 65–99)
Glucose-Capillary: 107 mg/dL — ABNORMAL HIGH (ref 65–99)
Glucose-Capillary: 136 mg/dL — ABNORMAL HIGH (ref 65–99)
Glucose-Capillary: 107 mg/dL — ABNORMAL HIGH (ref 65–99)

## 2016-02-24 LAB — CBC
HEMATOCRIT: 29.1 % — AB (ref 39.0–52.0)
HEMOGLOBIN: 8.6 g/dL — AB (ref 13.0–17.0)
MCH: 23.7 pg — ABNORMAL LOW (ref 26.0–34.0)
MCHC: 29.6 g/dL — AB (ref 30.0–36.0)
MCV: 80.2 fL (ref 78.0–100.0)
Platelets: 192 10*3/uL (ref 150–400)
RBC: 3.63 MIL/uL — ABNORMAL LOW (ref 4.22–5.81)
RDW: 20.8 % — ABNORMAL HIGH (ref 11.5–15.5)
WBC: 7.9 10*3/uL (ref 4.0–10.5)

## 2016-02-24 LAB — BASIC METABOLIC PANEL
ANION GAP: 8 (ref 5–15)
BUN: 20 mg/dL (ref 6–20)
CO2: 25 mmol/L (ref 22–32)
Calcium: 8.3 mg/dL — ABNORMAL LOW (ref 8.9–10.3)
Chloride: 100 mmol/L — ABNORMAL LOW (ref 101–111)
Creatinine, Ser: 1.13 mg/dL (ref 0.61–1.24)
GFR calc Af Amer: >60 mL/min (ref >60)
GLUCOSE: 117 mg/dL — AB (ref 65–99)
POTASSIUM: 3.5 mmol/L (ref 3.5–5.1)
Sodium: 133 mmol/L — ABNORMAL LOW (ref 135–145)

## 2016-02-24 MED ORDER — FUROSEMIDE 20 MG PO TABS
20.0000 mg | ORAL_TABLET | Freq: Every day | ORAL | Status: DC
  Administered 2016-02-24 – 2016-02-26 (×3): 20 mg via ORAL
  Filled 2016-02-24 (×3): qty 1

## 2016-02-24 NOTE — Progress Notes (Signed)
PROGRESS NOTE    [name] [name]  ZOX:[id number] DOB: 03/01/1951 DOA: 02/21/2016 PCP: Louie BostonAPPER,DAVID B, MD    Brief Narrative:  65 y.o. male, hypertension, hyperlipidemia, CAD, CHF (55-60%) co fatigue over the past 3 weeks,  And dyspnea over the past 1 week.  Subjective fever.  Pt thinks that his iron infusion might have had something to do with it. + generalized weakness, and loss of appetite.   Pt denies cough, cp, palp.   Assessment & Plan:   Principal Problem:   Elevated troponin I level Active Problems:   Dyspnea   S/P AVR (aortic valve replacement)   RLS (restless legs syndrome)   Type 2 diabetes mellitus with vascular disease (HCC)   Absolute anemia   Chronic anemia   1. Elevated troponin 1. Troponin initially rose to a peak of 0.18, subsequently trended down following blood transfusion 2. Cardiology was consulted 3. Suspect demand mismatch. Continue to transfuse as needed 2. Chronic iron deficiency anemia 1. Chart reviewed 2. Patient has a known prior hx of iron deficient anemia requiring IV iron replacement and blood transfusions. Patient was recommended to follow up with Hematology however pt had not done so given his other medical issues 3. Patient was given 1 unit of PRBCs on 02/22/2016 4. Iron mildly low at 36. Patient will continue with iron supplementation orally 3. S/p AVR  1. Prior hospitalization reviewed. Pt admitted for strep bacteremia recently, patient completed 6 weeks of IV Rocephin 2. There were concerns for possible SBE this admission 3. Patient is now status post TEE with no evidence of valvular vegetations, however there are areas of valvular thickening noted. Unclear if these are infectious or not. Discussed case with cardiology who will attempt to review and compare prior transesophageal echo findings. 4. For now, continue empiric antibiotics as mentioned below. 4. DM2 1. Cont on ssi coverage 5. Hx of endocarditits 1. Input by Cardiology noted and  appreciated 6. Bacteremia with gram-positive cocci and chains 1. Awaiting finalization of speciation and sensitivities 2. Prior documentation of recent hospital admission reviewed 3. Patient was discharged on 11/08/2015 with granulicatella adiacens bacteremia, typically associated with poor dentition. Patient had finished 6 weeks of IV Rocephin. 4. On that admission, patient was found to have multiple dental caries. Patient was recommended to [place] [facility] [facility] [place]. Unfortunately, patient has yet to follow-up with his dentist 5. Patient again reminded to follow-up with his dentist, suspect would require dental extraction given recurrent bacteremia with poor dentition. 6. Once speciation is finalized, would discuss treatment plan with infectious disease for antibiotic choice/recommendations 7. Acutely decompensated chronic diastolic congestive heart failure 1. Overnight, patient noted to have increased shortness of breath and worsened respiratory effort 2. Chest x-ray was obtained with findings consistent with congestive heart failure 3. Patient clinically improved with 40 mg of Lasix IV 4. Patient reports normally taking 20 mg of Lasix daily. We'll continue patient on Lasix while here. 5. Continue to monitor I's and O's and daily weights  DVT prophylaxis: xarelto Code Status: Full Family Communication: Pt in room Disposition Plan: Uncertain at this time  Consultants:   Cardiology  Discussed case with ID over phone  Procedures:   Transthoracic echocardiogram on 02/22/2016  Transesophageal echocardiogram on 02/23/2016  Antimicrobials: Anti-infectives    Start     Dose/Rate Route Frequency Ordered Stop   02/23/16 1800  cefTRIAXone (ROCEPHIN) 2 g in dextrose 5 % 50 mL IVPB     2 g 100 mL/hr  over 30 Minutes Intravenous Every 24 hours 02/22/16 1821     02/23/16 0630  vancomycin (VANCOCIN) 1,250 mg in sodium chloride 0.9 % 250 mL IVPB     1,250  mg 166.7 mL/hr over 90 Minutes Intravenous Every 12 hours 02/22/16 1821     02/22/16 1830  vancomycin (VANCOCIN) 2,500 mg in sodium chloride 0.9 % 500 mL IVPB     2,500 mg 250 mL/hr over 120 Minutes Intravenous  Once 02/22/16 1751 02/22/16 2142   02/22/16 1800  cefTRIAXone (ROCEPHIN) 2 g in dextrose 5 % 50 mL IVPB  Status:  Discontinued     2 g 100 mL/hr over 30 Minutes Intravenous Every 24 hours 02/22/16 1749 02/23/16 1334      Subjective: Noted to be more short of breath overnight. Reports improvement with Lasix   Objective: Vitals:   02/24/16 0056 02/24/16 0518 02/24/16 1045 02/24/16 1300  BP:  108/64 112/71 118/88  Pulse: (!) 104 64 78 83  Resp: 20 20  20   Temp:  97.6 F (36.4 C)  98.4 F (36.9 C)  TempSrc:  Oral  Oral  SpO2: 98% 93%  97%  Weight:  121.3 kg (267 lb 6.4 oz)    Height:        Intake/Output Summary (Last 24 hours) at 02/24/16 1448 Last data filed at 02/24/16 1200  Gross per 24 hour  Intake              900 ml  Output             1900 ml  Net            -1000 ml   Filed Weights   02/21/16 1820 02/23/16 0538 02/24/16 0518  Weight: 128.8 kg (284 lb) 122.8 kg (270 lb 11.6 oz) 121.3 kg (267 lb 6.4 oz)    Examination:  General exam: Appears calm and comfortable, Lying in bed, Asleep, arousable Respiratory system: Clear to auscultation. Respiratory effort normal. Cardiovascular system: S1 & S2 heard, RRR. Gastrointestinal system: Abdomen is nondistended, soft and nontender. No organomegaly or masses felt. Normal bowel sounds heard. Central nervous system: Alert and oriented. No focal neurological deficits. Extremities: Symmetric 5 x 5 power. Skin: No rashes, lesions  Psychiatry: Judgement and insight appear normal. Mood & affect appropriate.   Data Reviewed: I have personally reviewed following labs and imaging studies  CBC:  Recent Labs Lab 02/21/16 1929 02/22/16 0534 02/22/16 0538 02/22/16 2021 02/23/16 0435 02/24/16 0617  WBC 7.0 8.9  --    --  7.3 7.9  NEUTROABS 5.1  --   --   --   --   --   HGB 8.5* 8.1*  --  9.0* 8.4* 8.6*  HCT 28.6* 27.0* 25.8* 29.6* 27.5* 29.1*  MCV 79.2 78.9  --   --  78.3 80.2  PLT 164 178  --   --  160 192   Basic Metabolic Panel:  Recent Labs Lab 02/21/16 1929 02/21/16 1958 02/22/16 0534 02/23/16 0435 02/24/16 0617  NA 133*  --  134* 130* 133*  K 3.5  --  3.6 3.7 3.5  CL 101  --  104 103 100*  CO2 25  --  24 23 25   GLUCOSE 151*  --  132* 131* 117*  BUN 17  --  15 15 20   CREATININE 1.04  --  0.91 0.92 1.13  CALCIUM 8.6*  --  8.3* 7.8* 8.3*  MG  --  1.7  --   --   --  GFR: Estimated Creatinine Clearance: 86.4 mL/min (by C-G formula based on SCr of 1.13 mg/dL). Liver Function Tests:  Recent Labs Lab 02/22/16 0534  AST 32  ALT 23  ALKPHOS 110  BILITOT 1.2  PROT 7.5  ALBUMIN 2.8*   No results for input(s): LIPASE, AMYLASE in the last 168 hours. No results for input(s): AMMONIA in the last 168 hours. Coagulation Profile: No results for input(s): INR, PROTIME in the last 168 hours. Cardiac Enzymes:  Recent Labs Lab 02/22/16 0538 02/22/16 1211 02/22/16 1405 02/22/16 2021 02/23/16 0435  TROPONINI 0.08* 0.16* 0.18* 0.16* 0.13*   BNP (last 3 results) No results for input(s): PROBNP in the last 8760 hours. HbA1C: No results for input(s): HGBA1C in the last 72 hours. CBG:  Recent Labs Lab 02/23/16 1129 02/23/16 1647 02/23/16 2015 02/24/16 0734 02/24/16 1136  GLUCAP 135* 129* 138* 107* 105*   Lipid Profile: No results for input(s): CHOL, HDL, LDLCALC, TRIG, CHOLHDL, LDLDIRECT in the last 72 hours. Thyroid Function Tests:  Recent Labs  02/22/16 0019  TSH 1.641   Anemia Panel:  Recent Labs  02/22/16 0538  VITAMINB12 455  FERRITIN 222  TIBC 307  IRON 36*   Sepsis Labs: No results for input(s): PROCALCITON, LATICACIDVEN in the last 168 hours.  Recent Results (from the past 240 hour(s))  Blood culture (routine x 2)     Status: None (Preliminary result)    Collection Time: 02/21/16  8:20 PM  Result Value Ref Range Status   Specimen Description RIGHT ANTECUBITAL  Final   Special Requests BOTTLES DRAWN AEROBIC AND ANAEROBIC 6CC  Final   Culture  Setup Time   Final    GRAM POSITIVE COCCI IN CHAINS RECOVERED FROM BOTH AEROBIC AND ANAEROBIC BOTTLES Gram Stain Report Called to,Read Back By and Verified With: COVINTGTON,L. AT 1712 ON 02/22/2016 BY BAUGHAM,M. Performed at [facility]    Culture   Final    GRAM POSITIVE COCCI IDENTIFICATION TO FOLLOW Performed at [facility]    Report Status PENDING  Incomplete  Blood culture (routine x 2)     Status: None (Preliminary result)   Collection Time: 02/21/16  8:40 PM  Result Value Ref Range Status   Specimen Description BLOOD RIGHT FOREARM  Final   Special Requests BOTTLES DRAWN AEROBIC AND ANAEROBIC 6CC  Final   Culture  Setup Time   Final    GRAM POSITIVE COCCI IN CHAINS RECOVERED FROM BOTH THE AEROBIC AND ANAEROBIC BOTTLES. Gram Stain Report Called to,Read Back By and Verified With: COVINGTON,L. AT 1712 ON 02/22/2016 BY BAUGHAM,M. Performed at [facility]    Culture   Final    GRAM POSITIVE COCCI IDENTIFICATION TO FOLLOW Performed at [facility]    Report Status PENDING  Incomplete  Blood Culture ID Panel (Reflexed)     Status: None   Collection Time: 02/21/16  8:40 PM  Result Value Ref Range Status   Enterococcus species NOT DETECTED NOT DETECTED Final   Vancomycin resistance NOT DETECTED NOT DETECTED Final   Listeria monocytogenes NOT DETECTED NOT DETECTED Final   Staphylococcus species NOT DETECTED NOT DETECTED Final   Staphylococcus aureus NOT DETECTED NOT DETECTED Final   Methicillin resistance NOT DETECTED NOT DETECTED Final   Streptococcus species NOT DETECTED NOT DETECTED Final   Streptococcus agalactiae NOT DETECTED NOT DETECTED Final   Streptococcus pneumoniae NOT DETECTED NOT DETECTED Final   Streptococcus pyogenes NOT DETECTED NOT DETECTED  Final   Acinetobacter baumannii NOT DETECTED NOT DETECTED  Final   Enterobacteriaceae species NOT DETECTED NOT DETECTED Final   Enterobacter cloacae complex NOT DETECTED NOT DETECTED Final   Escherichia coli NOT DETECTED NOT DETECTED Final   Klebsiella oxytoca NOT DETECTED NOT DETECTED Final   Klebsiella pneumoniae NOT DETECTED NOT DETECTED Final   Proteus species NOT DETECTED NOT DETECTED Final   Serratia marcescens NOT DETECTED NOT DETECTED Final   Carbapenem resistance NOT DETECTED NOT DETECTED Final   Haemophilus influenzae NOT DETECTED NOT DETECTED Final   Neisseria meningitidis NOT DETECTED NOT DETECTED Final   Pseudomonas aeruginosa NOT DETECTED NOT DETECTED Final   Candida albicans NOT DETECTED NOT DETECTED Final   Candida glabrata NOT DETECTED NOT DETECTED Final   Candida krusei NOT DETECTED NOT DETECTED Final   Candida parapsilosis NOT DETECTED NOT DETECTED Final   Candida tropicalis NOT DETECTED NOT DETECTED Final    Comment: Performed at [facility]     Radiology Studies: Dg Chest Port 1 View  Result Date: 02/23/2016 CLINICAL DATA:  Shortness of breath and dyspnea for 1 week. History of CHF, diabetes, hypertension, coronary artery disease. EXAM: PORTABLE CHEST 1 VIEW COMPARISON:  02/21/2016 FINDINGS: Postoperative changes in the mediastinum and base of neck. Cardiac enlargement with pulmonary vascular congestion. Hazy perihilar infiltrates suggest edema. Shallow inspiration. No pleural effusions. No pneumothorax. Tortuous aorta. IMPRESSION: Congestive changes with cardiac enlargement, pulmonary vascular congestion, and perihilar edema, progressing since previous study Electronically Signed   By: Burman Nieves M.D.   On: 02/23/2016 18:56    Scheduled Meds: . amitriptyline  50 mg Oral QHS  . aspirin  325 mg Oral Daily  . cefTRIAXone (ROCEPHIN)  IV  2 g Intravenous Q24H  . cholecalciferol  5,000 Units Oral Daily  . diazepam  5 mg Oral QHS  . feeding supplement  (ENSURE ENLIVE)  237 mL Oral BID BM  . ferrous sulfate  325 mg Oral Daily   And  . folic acid  1 mg Oral Daily   And  . vitamin C  500 mg Oral Daily  . FLUoxetine  40 mg Oral Daily  . insulin aspart  0-15 Units Subcutaneous TID WC  . insulin aspart  0-5 Units Subcutaneous QHS  . levothyroxine  112 mcg Oral QAC breakfast  . lisinopril  10 mg Oral Daily  . metFORMIN  500 mg Oral BID WC  . metoprolol tartrate  25 mg Oral QPM  . pantoprazole  40 mg Oral Daily  . potassium chloride  20 mEq Oral Daily  . rivaroxaban  20 mg Oral Q supper  . simvastatin  40 mg Oral QHS  . sodium chloride flush  3 mL Intravenous Q12H  . vancomycin  1,250 mg Intravenous Q12H   Continuous Infusions:    LOS: 1 day   CHIU, Scheryl Marten, MD [facility] Pager [phone]  If 7PM-7AM, please contact night-coverage [email or web address] Password [email or web address] 02/24/2016, 2:48 PM

## 2016-02-24 NOTE — Progress Notes (Signed)
CPAP checked water added to it.

## 2016-02-24 NOTE — Progress Notes (Signed)
refused

## 2016-02-25 LAB — VANCOMYCIN, TROUGH: Vancomycin Tr: 18 ug/mL (ref 15–20)

## 2016-02-25 LAB — BASIC METABOLIC PANEL
Anion gap: 8 (ref 5–15)
BUN: 17 mg/dL (ref 6–20)
CHLORIDE: 101 mmol/L (ref 101–111)
CO2: 24 mmol/L (ref 22–32)
Calcium: 8.4 mg/dL — ABNORMAL LOW (ref 8.9–10.3)
Creatinine, Ser: 0.97 mg/dL (ref 0.61–1.24)
GFR calc Af Amer: >60 mL/min (ref >60)
GLUCOSE: 121 mg/dL — AB (ref 65–99)
POTASSIUM: 3.7 mmol/L (ref 3.5–5.1)
Sodium: 133 mmol/L — ABNORMAL LOW (ref 135–145)

## 2016-02-25 LAB — CBC
HEMATOCRIT: 29.5 % — AB (ref 39.0–52.0)
Hemoglobin: 8.8 g/dL — ABNORMAL LOW (ref 13.0–17.0)
MCH: 23.8 pg — AB (ref 26.0–34.0)
MCHC: 29.8 g/dL — AB (ref 30.0–36.0)
MCV: 79.7 fL (ref 78.0–100.0)
Platelets: 202 10*3/uL (ref 150–400)
RBC: 3.7 MIL/uL — ABNORMAL LOW (ref 4.22–5.81)
RDW: 20.6 % — AB (ref 11.5–15.5)
WBC: 7.1 10*3/uL (ref 4.0–10.5)

## 2016-02-25 LAB — GLUCOSE, CAPILLARY
GLUCOSE-CAPILLARY: 112 mg/dL — AB (ref 65–99)
GLUCOSE-CAPILLARY: 123 mg/dL — AB (ref 65–99)
GLUCOSE-CAPILLARY: 140 mg/dL — AB (ref 65–99)
Glucose-Capillary: 115 mg/dL — ABNORMAL HIGH (ref 65–99)

## 2016-02-25 LAB — CULTURE, BLOOD (ROUTINE X 2)

## 2016-02-25 NOTE — Progress Notes (Signed)
Pharmacy Antibiotic Note  [name] [name] is a 65 y.o. male admitted on 02/21/2016 with sepsis.  Pharmacy has been consulted for vancomycin and rocephin dosing. He has gpc in 2/2 BC Vancomycin trough reported as 3018mcg/ml, therapeutic.   Plan: Continue Vancomycin  1250 mg IV q12 hours Rocephin 2 gm IV q24 hours F/u renal function, cultures and clinical course Deescalate therapy as indicated  Height: 5\' 11"  (180.3 cm) Weight: 267 lb 3.2 oz (121.2 kg) IBW/kg (Calculated) : 75.3  Temp (24hrs), Avg:98 F (36.7 C), Min:97.7 F (36.5 C), Max:98.4 F (36.9 C)   Recent Labs Lab 02/21/16 1929 02/22/16 0534 02/23/16 0435 02/24/16 0617 02/25/16 0558  WBC 7.0 8.9 7.3 7.9 7.1  CREATININE 1.04 0.91 0.92 1.13 0.97  VANCOTROUGH  --   --   --   --  18    Estimated Creatinine Clearance: 100.6 mL/min (by C-G formula based on SCr of 0.97 mg/dL).    Allergies  Allergen Reactions  . Invokana [Canagliflozin] Rash    Antimicrobials this admission: rocephin 8/31 >>  vancomycin 8/31 >>    Microbiology results: 8/30  BCx: gpc 8/30 BCID nothing to assist with ID of org/ Previous strep granulicatella adiacens  Thank you for allowing pharmacy to be a part of this patient's care.  Elder CyphersLorie Elli Groesbeck, BS Pharm D, BCPS Clinical Pharmacist Pager [phone]  02/25/2016 8:15 AM

## 2016-02-25 NOTE — Progress Notes (Signed)
PROGRESS NOTE    [name] [name]  RUE:[id number] DOB: [birth year] DOA: 02/21/2016 PCP: Louie Boston, MD    Brief Narrative:  65 y.o. male, hypertension, hyperlipidemia, CAD, CHF (55-60%) co fatigue over the past 3 weeks,  And dyspnea over the past 1 week.  Subjective fever.  Pt thinks that his iron infusion might have had something to do with it. + generalized weakness, and loss of appetite.   Pt denies cough, cp, palp.   Assessment & Plan:   Principal Problem:   Elevated troponin I level Active Problems:   Dyspnea   S/P AVR (aortic valve replacement)   RLS (restless legs syndrome)   Type 2 diabetes mellitus with vascular disease (HCC)   Absolute anemia   Chronic anemia   1. Elevated troponin 1. Troponin initially rose to a peak of 0.18, subsequently trended down following blood transfusion 2. Suspect demand mismatch. Continue to transfuse as needed 2. Chronic iron deficiency anemia 1. Chart reviewed 2. Patient has a known prior hx of iron deficient anemia requiring IV iron replacement and blood transfusions. Patient was recommended to follow up with Hematology however pt had not done so given his other medical issues 3. Patient was given 1 unit of PRBCs on 02/22/2016 4. Iron mildly low at 36. Patient will continue with iron supplementation orally 3. S/p AVR  1. Prior hospitalization reviewed. Pt admitted for strep bacteremia recently, patient completed 6 weeks of IV Rocephin 2. There were concerns for possible SBE this admission 3. Patient is now status post TEE with no evidence of valvular vegetations, however there are areas of valvular thickening noted. Unclear if these are infectious or not. Discussed case with cardiology who will attempt to review and compare prior transesophageal echo findings. Awaiting input by Cardiology 4. For now, continue empiric antibiotics as mentioned below. 4. DM2 1. Cont on ssi coverage 5. Hx of endocarditits 1. Input by Cardiology noted and  appreciated 6. Bacteremia with gram-positive cocci and chains 1. Blood cultures have speciated Microaerophilic streptococci  2. Patient was discharged on 11/08/2015 with granulicatella adiacens bacteremia, typically associated with poor dentition. Patient had finished 6 weeks of IV Rocephin. 3. On that admission, patient was found to have multiple dental caries. Patient was recommended to follow-up with his primary dentist in [facility]. Unfortunately, patient has yet to follow-up with his dentist 4. Patient again reminded to follow-up with his dentist, suspect would require dental extraction given recurrent bacteremia with poor dentition. 5. Discussed case with ID who recommends 2 weeks of rocephin monotherapy if little/no concern of valvular involvement vs 6 weeks of therapy if concerns of valvular involvement remains. Await input by Cardiology 6. Will place PICC in anticipation for outpt IV therapy 7. Acutely decompensated chronic diastolic congestive heart failure 1. Overnight, patient noted to have increased shortness of breath and worsened respiratory effort 2. Chest x-ray was obtained with findings consistent with congestive heart failure 3. Patient clinically improved with 40 mg of Lasix IV 4. Patient reports normally taking 20 mg of Lasix daily. Will continue patient on Lasix while here. 5. Continue to monitor I's and O's and daily weights  DVT prophylaxis: xarelto Code Status: Full Family Communication: Pt in room Disposition Plan: Uncertain at this time  Consultants:   Cardiology  Discussed case with ID over phone  Procedures:   Transthoracic echocardiogram on 02/22/2016  Transesophageal echocardiogram on 02/23/2016  Antimicrobials: Anti-infectives    Start     Dose/Rate Route Frequency Ordered Stop   02/23/16  1800  cefTRIAXone (ROCEPHIN) 2 g in dextrose 5 % 50 mL IVPB     2 g 100 mL/hr over 30 Minutes Intravenous Every 24 hours 02/22/16 1821      02/23/16 0630  vancomycin (VANCOCIN) 1,250 mg in sodium chloride 0.9 % 250 mL IVPB  Status:  Discontinued     1,250 mg 166.7 mL/hr over 90 Minutes Intravenous Every 12 hours 02/22/16 1821 02/25/16 1231   02/22/16 1830  vancomycin (VANCOCIN) 2,500 mg in sodium chloride 0.9 % 500 mL IVPB     2,500 mg 250 mL/hr over 120 Minutes Intravenous  Once 02/22/16 1751 02/22/16 2142   02/22/16 1800  cefTRIAXone (ROCEPHIN) 2 g in dextrose 5 % 50 mL IVPB  Status:  Discontinued     2 g 100 mL/hr over 30 Minutes Intravenous Every 24 hours 02/22/16 1749 02/23/16 1334      Subjective: No complaints this AM. Denies sob. Denies chest pain. Eager to go home  Objective: Vitals:   02/24/16 2045 02/24/16 2146 02/25/16 0200 02/25/16 0517  BP:  101/69  118/72  Pulse: 63 (!) 50 (!) 49 95  Resp: 20 20 20 20   Temp:  97.8 F (36.6 C)  97.7 F (36.5 C)  TempSrc:  Oral  Oral  SpO2: 93% 98% 95% 97%  Weight:    121.2 kg (267 lb 3.2 oz)  Height:        Intake/Output Summary (Last 24 hours) at 02/25/16 1355 Last data filed at 02/25/16 0600  Gross per 24 hour  Intake              830 ml  Output             1500 ml  Net             -670 ml   Filed Weights   02/23/16 0538 02/24/16 0518 02/25/16 0517  Weight: 122.8 kg (270 lb 11.6 oz) 121.3 kg (267 lb 6.4 oz) 121.2 kg (267 lb 3.2 oz)    Examination:  General exam: Appears calm and comfortable, Lying in bed, Asleep, arousable Respiratory system: Clear to auscultation. Respiratory effort normal. Cardiovascular system: S1 & S2 heard, RRR. Gastrointestinal system: Abdomen is nondistended, soft and nontender. No organomegaly or masses felt. Normal bowel sounds heard. Central nervous system: Alert and oriented. No focal neurological deficits. Extremities: Symmetric 5 x 5 power. Skin: No rashes, lesions  Psychiatry: Judgement and insight appear normal. Mood & affect appropriate.   Data Reviewed: I have personally reviewed following labs and imaging  studies  CBC:  Recent Labs Lab 02/21/16 1929 02/22/16 0534 02/22/16 0538 02/22/16 2021 02/23/16 0435 02/24/16 0617 02/25/16 0558  WBC 7.0 8.9  --   --  7.3 7.9 7.1  NEUTROABS 5.1  --   --   --   --   --   --   HGB 8.5* 8.1*  --  9.0* 8.4* 8.6* 8.8*  HCT 28.6* 27.0* 25.8* 29.6* 27.5* 29.1* 29.5*  MCV 79.2 78.9  --   --  78.3 80.2 79.7  PLT 164 178  --   --  160 192 202   Basic Metabolic Panel:  Recent Labs Lab 02/21/16 1929 02/21/16 1958 02/22/16 0534 02/23/16 0435 02/24/16 0617 02/25/16 0558  NA 133*  --  134* 130* 133* 133*  K 3.5  --  3.6 3.7 3.5 3.7  CL 101  --  104 103 100* 101  CO2 25  --  24 23 25 24   GLUCOSE 151*  --  132* 131* 117* 121*  BUN 17  --  15 15 20 17   CREATININE 1.04  --  0.91 0.92 1.13 0.97  CALCIUM 8.6*  --  8.3* 7.8* 8.3* 8.4*  MG  --  1.7  --   --   --   --    GFR: Estimated Creatinine Clearance: 100.6 mL/min (by C-G formula based on SCr of 0.97 mg/dL). Liver Function Tests:  Recent Labs Lab 02/22/16 0534  AST 32  ALT 23  ALKPHOS 110  BILITOT 1.2  PROT 7.5  ALBUMIN 2.8*   No results for input(s): LIPASE, AMYLASE in the last 168 hours. No results for input(s): AMMONIA in the last 168 hours. Coagulation Profile: No results for input(s): INR, PROTIME in the last 168 hours. Cardiac Enzymes:  Recent Labs Lab 02/22/16 0538 02/22/16 1211 02/22/16 1405 02/22/16 2021 02/23/16 0435  TROPONINI 0.08* 0.16* 0.18* 0.16* 0.13*   BNP (last 3 results) No results for input(s): PROBNP in the last 8760 hours. HbA1C: No results for input(s): HGBA1C in the last 72 hours. CBG:  Recent Labs Lab 02/24/16 1136 02/24/16 1616 02/24/16 2034 02/25/16 0716 02/25/16 1131  GLUCAP 105* 136* 107* 115* 112*   Lipid Profile: No results for input(s): CHOL, HDL, LDLCALC, TRIG, CHOLHDL, LDLDIRECT in the last 72 hours. Thyroid Function Tests: No results for input(s): TSH, T4TOTAL, FREET4, T3FREE, THYROIDAB in the last 72 hours. Anemia Panel: No  results for input(s): VITAMINB12, FOLATE, FERRITIN, TIBC, IRON, RETICCTPCT in the last 72 hours. Sepsis Labs: No results for input(s): PROCALCITON, LATICACIDVEN in the last 168 hours.  Recent Results (from the past 240 hour(s))  Blood culture (routine x 2)     Status: Abnormal   Collection Time: 02/21/16  8:20 PM  Result Value Ref Range Status   Specimen Description RIGHT ANTECUBITAL  Final   Special Requests BOTTLES DRAWN AEROBIC AND ANAEROBIC 6CC  Final   Culture  Setup Time   Final    GRAM POSITIVE COCCI IN CHAINS RECOVERED FROM BOTH AEROBIC AND ANAEROBIC BOTTLES Gram Stain Report Called to,Read Back By and Verified With: COVINTGTON,L. AT 1712 ON 02/22/2016 BY BAUGHAM,M. Performed at [facility]    Culture (A)  Final    MICROAEROPHILIC STREPTOCOCCI Standardized susceptibility testing for this organism is not available. [facility]    Report Status 02/25/2016 FINAL  Final  Blood culture (routine x 2)     Status: Abnormal   Collection Time: 02/21/16  8:40 PM  Result Value Ref Range Status   Specimen Description BLOOD RIGHT FOREARM  Final   Special Requests BOTTLES DRAWN AEROBIC AND ANAEROBIC 6CC  Final   Culture  Setup Time   Final    GRAM POSITIVE COCCI IN CHAINS RECOVERED FROM BOTH THE AEROBIC AND ANAEROBIC BOTTLES. Gram Stain Report Called to,Read Back By and Verified With: COVINGTON,L. AT 1712 ON 02/22/2016 BY BAUGHAM,M. Performed at [facility]    Culture (A)  Final    MICROAEROPHILIC STREPTOCOCCI Standardized susceptibility testing for this organism is not available. Performed at [facility]    Report Status 02/25/2016 FINAL  Final  Blood Culture ID Panel (Reflexed)     Status: None   Collection Time: 02/21/16  8:40 PM  Result Value Ref Range Status   Enterococcus species NOT DETECTED NOT DETECTED Final   Vancomycin resistance NOT DETECTED NOT DETECTED Final   Listeria monocytogenes NOT DETECTED NOT DETECTED Final    Staphylococcus species NOT DETECTED NOT DETECTED Final  Staphylococcus aureus NOT DETECTED NOT DETECTED Final   Methicillin resistance NOT DETECTED NOT DETECTED Final   Streptococcus species NOT DETECTED NOT DETECTED Final   Streptococcus agalactiae NOT DETECTED NOT DETECTED Final   Streptococcus pneumoniae NOT DETECTED NOT DETECTED Final   Streptococcus pyogenes NOT DETECTED NOT DETECTED Final   Acinetobacter baumannii NOT DETECTED NOT DETECTED Final   Enterobacteriaceae species NOT DETECTED NOT DETECTED Final   Enterobacter cloacae complex NOT DETECTED NOT DETECTED Final   Escherichia coli NOT DETECTED NOT DETECTED Final   Klebsiella oxytoca NOT DETECTED NOT DETECTED Final   Klebsiella pneumoniae NOT DETECTED NOT DETECTED Final   Proteus species NOT DETECTED NOT DETECTED Final   Serratia marcescens NOT DETECTED NOT DETECTED Final   Carbapenem resistance NOT DETECTED NOT DETECTED Final   Haemophilus influenzae NOT DETECTED NOT DETECTED Final   Neisseria meningitidis NOT DETECTED NOT DETECTED Final   Pseudomonas aeruginosa NOT DETECTED NOT DETECTED Final   Candida albicans NOT DETECTED NOT DETECTED Final   Candida glabrata NOT DETECTED NOT DETECTED Final   Candida krusei NOT DETECTED NOT DETECTED Final   Candida parapsilosis NOT DETECTED NOT DETECTED Final   Candida tropicalis NOT DETECTED NOT DETECTED Final    Comment: Performed at [facility]     Radiology Studies: Dg Chest Port 1 View  Result Date: 02/23/2016 CLINICAL DATA:  Shortness of breath and dyspnea for 1 week. History of CHF, diabetes, hypertension, coronary artery disease. EXAM: PORTABLE CHEST 1 VIEW COMPARISON:  02/21/2016 FINDINGS: Postoperative changes in the mediastinum and base of neck. Cardiac enlargement with pulmonary vascular congestion. Hazy perihilar infiltrates suggest edema. Shallow inspiration. No pleural effusions. No pneumothorax. Tortuous aorta. IMPRESSION: Congestive changes with cardiac  enlargement, pulmonary vascular congestion, and perihilar edema, progressing since previous study Electronically Signed   By: Burman Nieves M.D.   On: 02/23/2016 18:56    Scheduled Meds: . amitriptyline  50 mg Oral QHS  . aspirin  325 mg Oral Daily  . cefTRIAXone (ROCEPHIN)  IV  2 g Intravenous Q24H  . cholecalciferol  5,000 Units Oral Daily  . diazepam  5 mg Oral QHS  . feeding supplement (ENSURE ENLIVE)  237 mL Oral BID BM  . ferrous sulfate  325 mg Oral Daily   And  . folic acid  1 mg Oral Daily   And  . vitamin C  500 mg Oral Daily  . FLUoxetine  40 mg Oral Daily  . furosemide  20 mg Oral Daily  . insulin aspart  0-15 Units Subcutaneous TID WC  . insulin aspart  0-5 Units Subcutaneous QHS  . levothyroxine  112 mcg Oral QAC breakfast  . lisinopril  10 mg Oral Daily  . metFORMIN  500 mg Oral BID WC  . metoprolol tartrate  25 mg Oral QPM  . pantoprazole  40 mg Oral Daily  . potassium chloride  20 mEq Oral Daily  . rivaroxaban  20 mg Oral Q supper  . simvastatin  40 mg Oral QHS  . sodium chloride flush  3 mL Intravenous Q12H   Continuous Infusions:    LOS: 2 days   Saliha Salts, Scheryl Marten, MD [facility] Pager [phone]  If 7PM-7AM, please contact night-coverage [email or web address] Password [email or web address] 02/25/2016, 1:55 PM

## 2016-02-26 LAB — GLUCOSE, CAPILLARY
GLUCOSE-CAPILLARY: 108 mg/dL — AB (ref 65–99)
Glucose-Capillary: 127 mg/dL — ABNORMAL HIGH (ref 65–99)

## 2016-02-26 LAB — BASIC METABOLIC PANEL
Anion gap: 5 (ref 5–15)
BUN: 15 mg/dL (ref 6–20)
CALCIUM: 8.5 mg/dL — AB (ref 8.9–10.3)
CHLORIDE: 101 mmol/L (ref 101–111)
CO2: 27 mmol/L (ref 22–32)
CREATININE: 1.06 mg/dL (ref 0.61–1.24)
GFR calc Af Amer: >60 mL/min (ref >60)
GFR calc non Af Amer: >60 mL/min (ref >60)
GLUCOSE: 108 mg/dL — AB (ref 65–99)
Potassium: 3.8 mmol/L (ref 3.5–5.1)
Sodium: 133 mmol/L — ABNORMAL LOW (ref 135–145)

## 2016-02-26 LAB — CBC
HCT: 27.6 % — ABNORMAL LOW (ref 39.0–52.0)
Hemoglobin: 8.3 g/dL — ABNORMAL LOW (ref 13.0–17.0)
MCH: 24.6 pg — AB (ref 26.0–34.0)
MCHC: 30.1 g/dL (ref 30.0–36.0)
MCV: 81.7 fL (ref 78.0–100.0)
PLATELETS: 218 10*3/uL (ref 150–400)
RBC: 3.38 MIL/uL — AB (ref 4.22–5.81)
RDW: 20.5 % — ABNORMAL HIGH (ref 11.5–15.5)
WBC: 7 10*3/uL (ref 4.0–10.5)

## 2016-02-26 MED ORDER — FUROSEMIDE 20 MG PO TABS: 20.0000 mg | ORAL_TABLET | Freq: Every day | ORAL | 0 refills | Status: DC

## 2016-02-26 MED ORDER — DEXTROSE 5 % IV SOLN: 2.0000 g | INTRAVENOUS | Status: DC

## 2016-02-26 MED ORDER — HEPARIN SOD (PORK) LOCK FLUSH 100 UNIT/ML IV SOLN
250.0000 [IU] | INTRAVENOUS | Status: AC | PRN
  Administered 2016-02-26: 250 [IU]
  Filled 2016-02-26: qty 5

## 2016-02-26 NOTE — Progress Notes (Signed)
Patient sleep not using CPAP on oxygen 3lpm saturation 98 . CPAP machine in room.

## 2016-02-26 NOTE — Discharge Summary (Signed)
Physician Discharge Summary  [name] [name] ZOX:[id number] DOB: 1950-08-10 DOA: 02/21/2016  PCP: Louie Boston, MD  Admit date: 02/21/2016 Discharge date: 02/26/2016  Admitted From: Home Disposition:  Home  Recommendations for Outpatient Follow-up:  1. Follow up with PCP in 1-2 weeks 2. Follow up with Dr. Eden Emms at next available appointment 3. Recommend repeat CBC in 1-2 weeks. Consider referral to outpatient Hematology 4. Recommend referral to dentist for possible dental extraction 5. Recommend repeat blood cultures 24-48hrs after stopping antibiotics 6. Recommend d/c PICC after finishing abx course    Discharge Condition:Stable CODE STATUS:Full Diet recommendation: Heart Healthy   Brief/Interim Summary: 65 y.o.male,hypertension, hyperlipidemia, CAD, CHF (55-60%) co fatigue over the past 3 weeks, And dyspnea over the past 1 week. Subjective fever. Pt thinks that his iron infusion might have had something to do with it. + generalized weakness, and loss of appetite. Pt denies cough, cp, palp.   Discharge Diagnoses:  Principal Problem:   Elevated troponin I level Active Problems:   Dyspnea   S/P AVR (aortic valve replacement)   RLS (restless legs syndrome)   Type 2 diabetes mellitus with vascular disease (HCC)   Absolute anemia   Chronic anemia   1. Elevated troponin 1. Troponin initially rose to a peak of 0.18, subsequently trended down following blood transfusion 2. Suspect demand mismatch. Continue to transfuse as needed 2. Chronic iron deficiency anemia 1. Chart reviewed 2. Patient has a known prior hx of iron deficient anemia requiring IV iron replacement and blood transfusions. Patient was previously recommended to follow up with Hematology however pt had not done so given his other medical issues 3. Patient was given 1 unit of PRBCs on 02/22/2016 4. Iron mildly low at 36. Patient to continue with iron supplementation orally 3. S/p AVR  1. Prior hospitalization  reviewed. Pt admitted for strep bacteremia recently, patient completed 6 weeks of IV Rocephin 2. There were concerns for possible SBE this admission 3. Patient underwent TEE with no evidence of valvular vegetations, however there are areas of valvular thickening noted. Discussed case with Dr. Wyline Mood who feels areas of thickening are likely not sign of cardiac involvement (see below) 4. DM2 1. Cont on ssi coverage 5. Hx of endocarditits 1. Input by Cardiology noted and appreciated 6. Bacteremia with gram-positive cocci and chains 1. Blood cultures have speciated Microaerophilic streptococci  2. Patient was discharged on 11/08/2015 with granulicatella adiacens bacteremia, typically associated with poor dentition. Patient had finished 6 weeks of IV Rocephin. 3. On that admission, patient was found to have multiple dental caries. Patient was recommended to follow-up with his primary dentist in [facility]. Unfortunately, patient has yet to follow-up with his dentist.  4. Patient again reminded to follow-up with his dentist, suspect would require dental extraction given recurrent bacteremia with poor dentition. 5. Discussed case with ID who recommends 2 weeks of rocephin monotherapy on discharge (10 more days of therapy after discharge) 6. Will place PICC in anticipation for outpt IV therapy 7. Acutely decompensated chronic diastolic congestive heart failure 1. Overnight, patient noted to have increased shortness of breath and worsened respiratory effort 2. Chest x-ray was obtained with findings consistent with congestive heart failure 3. Patient clinically improved with 40 mg of Lasix IV 4. Patient reports normally taking 20 mg of Lasix daily. Will continue patient on Lasix while here. 5. Continue to monitor I's and O's and daily weights  Discharge Instructions     Medication List    TAKE these medications  acetaminophen 650 MG CR tablet Commonly known as:  TYLENOL Take 1,300  mg by mouth daily as needed for pain.   amitriptyline 50 MG tablet Commonly known as:  ELAVIL Take 50 mg by mouth at bedtime.   cefTRIAXone 2 g in dextrose 5 % 50 mL Inject 2 g into the vein daily. Start taking on:  02/27/2016   diazepam 5 MG tablet Commonly known as:  VALIUM Take 5 mg by mouth at bedtime.   ECOTRIN 325 MG EC tablet Generic drug:  aspirin Take 325 mg by mouth daily.   Ferrous Sulfate-C-Folic Acid 105-500-0.8 MG Tbcr Take 500 mg by mouth daily.   FLUoxetine 40 MG capsule Commonly known as:  PROZAC Take 40 mg by mouth daily.   furosemide 20 MG tablet Commonly known as:  LASIX Take 1 tablet (20 mg total) by mouth daily.   HYDROcodone-acetaminophen 5-325 MG tablet Commonly known as:  NORCO/VICODIN take 1 tablet by mouth every 6 hours if needed for SEVERE PAIN   levothyroxine 112 MCG tablet Commonly known as:  SYNTHROID, LEVOTHROID Take 112 mcg by mouth daily before breakfast.   lisinopril 10 MG tablet Commonly known as:  PRINIVIL,ZESTRIL Take 10 mg by mouth daily.   metFORMIN 500 MG 24 hr tablet Commonly known as:  GLUCOPHAGE-XR Take 500 mg by mouth 2 (two) times daily.   metoprolol tartrate 25 MG tablet Commonly known as:  LOPRESSOR take 1 tablet by mouth once daily What changed:  See the new instructions.   omeprazole 20 MG capsule Commonly known as:  PRILOSEC Take 20 mg by mouth at bedtime.   rivaroxaban 20 MG Tabs tablet Commonly known as:  XARELTO Take 1 tablet (20 mg total) by mouth daily. What changed:  when to take this   simvastatin 40 MG tablet Commonly known as:  ZOCOR take 1 tablet by mouth at bedtime   Vitamin D3 5000 units Caps Take 1 capsule by mouth daily.      Follow-up Information    TAPPER,DAVID B, MD. Schedule an appointment as soon as possible for a visit in 2 week(s).   Specialty:  Family Medicine Contact information: [address] [place] [address] [phone]        Charlton HawsPeter Nishan, MD .    Specialty:  Cardiology Why:  at next available appointment Contact information: [address] [place] [address] [phone]        Andreas NewportBHATT, NISHANT, MD .   Specialty:  Plastic Surgery Contact information: [address] [place] [address] [phone]          Allergies  Allergen Reactions  . Invokana [Canagliflozin] Rash    Consultations:  Cardiology  Discussed case with ID over phone  Procedures/Studies: Dg Chest 1 View  Result Date: 02/21/2016 CLINICAL DATA:  Shortness of breath. EXAM: CHEST 1 VIEW COMPARISON:  Radiographs of Nov 03, 2015. FINDINGS: Stable cardiomediastinal silhouette. No pneumothorax or pleural effusion is noted. Bony thorax is unremarkable. Sternotomy wires are noted. No acute pulmonary disease is noted. IMPRESSION: No acute cardiopulmonary abnormality seen. Electronically Signed   By: Lupita RaiderJames  Green Jr, M.D.   On: 02/21/2016 18:52   [place] Renal  Result Date: 02/22/2016 CLINICAL DATA:  Hematuria . EXAM: RENAL / URINARY TRACT ULTRASOUND COMPLETE COMPARISON:  None. FINDINGS: Right Kidney: Length: 13.7 cm. Echogenicity within normal limits. No mass or hydronephrosis visualized. Left Kidney: Length: 13.7 cm. Echogenicity within normal limits. No mass or hydronephrosis visualized. Tiny nonobstructing  left renal stone cannot be excluded . Bladder: Appears normal for degree of bladder distention. IMPRESSION: Tiny nonobstructing left renal stone cannot be excluded. Exam is otherwise normal. Electronically Signed   By: Maisie Fus  Register   On: 02/22/2016 10:10   Dg Chest Port 1 View  Result Date: 02/23/2016 CLINICAL DATA:  Shortness of breath and dyspnea for 1 week. History of CHF, diabetes, hypertension, coronary artery disease. EXAM: PORTABLE CHEST 1 VIEW COMPARISON:  02/21/2016 FINDINGS: Postoperative changes in the mediastinum and base of neck. Cardiac enlargement with pulmonary vascular congestion. Hazy perihilar  infiltrates suggest edema. Shallow inspiration. No pleural effusions. No pneumothorax. Tortuous aorta. IMPRESSION: Congestive changes with cardiac enlargement, pulmonary vascular congestion, and perihilar edema, progressing since previous study Electronically Signed   By: Burman Nieves M.D.   On: 02/23/2016 18:56    Subjective: Eager to go home today  Discharge Exam: Vitals:   02/26/16 0201 02/26/16 0552  BP:  119/74  Pulse: 87 89  Resp: 20 20  Temp:  97.7 F (36.5 C)   Vitals:   02/25/16 1535 02/25/16 2117 02/26/16 0201 02/26/16 0552  BP: 131/67 104/62  119/74  Pulse: 76 72 87 89  Resp: 18 20 20 20   Temp: 97.8 F (36.6 C) 98.3 F (36.8 C)  97.7 F (36.5 C)  TempSrc: Oral Oral  Oral  SpO2: 96% 91% 98% 100%  Weight:    120.9 kg (266 lb 9.6 oz)  Height:        General: Pt is alert, awake, not in acute distress Cardiovascular: RRR, S1/S2 +, no rubs, no gallops Respiratory: CTA bilaterally, no wheezing, no rhonchi Abdominal: Soft, NT, ND, bowel sounds + Extremities: no edema, no cyanosis   The results of significant diagnostics from this hospitalization (including imaging, microbiology, ancillary and laboratory) are listed below for reference.     Microbiology: Recent Results (from the past 240 hour(s))  Blood culture (routine x 2)     Status: Abnormal   Collection Time: 02/21/16  8:20 PM  Result Value Ref Range Status   Specimen Description RIGHT ANTECUBITAL  Final   Special Requests BOTTLES DRAWN AEROBIC AND ANAEROBIC 6CC  Final   Culture  Setup Time   Final    GRAM POSITIVE COCCI IN CHAINS RECOVERED FROM BOTH AEROBIC AND ANAEROBIC BOTTLES Gram Stain Report Called to,Read Back By and Verified With: COVINTGTON,L. AT 1712 ON 02/22/2016 BY BAUGHAM,M. Performed at [facility]    Culture (A)  Final    MICROAEROPHILIC STREPTOCOCCI Standardized susceptibility testing for this organism is not available. Performed at [facility]    Report Status  02/25/2016 FINAL  Final  Blood culture (routine x 2)     Status: Abnormal   Collection Time: 02/21/16  8:40 PM  Result Value Ref Range Status   Specimen Description BLOOD RIGHT FOREARM  Final   Special Requests BOTTLES DRAWN AEROBIC AND ANAEROBIC 6CC  Final   Culture  Setup Time   Final    GRAM POSITIVE COCCI IN CHAINS RECOVERED FROM BOTH THE AEROBIC AND ANAEROBIC BOTTLES. Gram Stain Report Called to,Read Back By and Verified With: COVINGTON,L. AT 1712 ON 02/22/2016 BY BAUGHAM,M. Performed at [facility]    Culture (A)  Final    MICROAEROPHILIC STREPTOCOCCI Standardized susceptibility testing for this organism is not available. Performed at [facility]    Report Status 02/25/2016 FINAL  Final  Blood Culture ID Panel (Reflexed)     Status: None   Collection Time: 02/21/16  8:40 PM  Result Value Ref Range Status   Enterococcus species NOT DETECTED NOT DETECTED Final   Vancomycin resistance NOT DETECTED NOT DETECTED Final   Listeria monocytogenes NOT DETECTED NOT DETECTED Final   Staphylococcus species NOT DETECTED NOT DETECTED Final   Staphylococcus aureus NOT DETECTED NOT DETECTED Final   Methicillin resistance NOT DETECTED NOT DETECTED Final   Streptococcus species NOT DETECTED NOT DETECTED Final   Streptococcus agalactiae NOT DETECTED NOT DETECTED Final   Streptococcus pneumoniae NOT DETECTED NOT DETECTED Final   Streptococcus pyogenes NOT DETECTED NOT DETECTED Final   Acinetobacter baumannii NOT DETECTED NOT DETECTED Final   Enterobacteriaceae species NOT DETECTED NOT DETECTED Final   Enterobacter cloacae complex NOT DETECTED NOT DETECTED Final   Escherichia coli NOT DETECTED NOT DETECTED Final   Klebsiella oxytoca NOT DETECTED NOT DETECTED Final   Klebsiella pneumoniae NOT DETECTED NOT DETECTED Final   Proteus species NOT DETECTED NOT DETECTED Final   Serratia marcescens NOT DETECTED NOT DETECTED Final   Carbapenem resistance NOT DETECTED NOT DETECTED Final    Haemophilus influenzae NOT DETECTED NOT DETECTED Final   Neisseria meningitidis NOT DETECTED NOT DETECTED Final   Pseudomonas aeruginosa NOT DETECTED NOT DETECTED Final   Candida albicans NOT DETECTED NOT DETECTED Final   Candida glabrata NOT DETECTED NOT DETECTED Final   Candida krusei NOT DETECTED NOT DETECTED Final   Candida parapsilosis NOT DETECTED NOT DETECTED Final   Candida tropicalis NOT DETECTED NOT DETECTED Final    Comment: Performed at [facility]     Labs: BNP (last 3 results)  Recent Labs  11/03/15 1722 02/21/16 1958  BNP 398.0* 443.0*   Basic Metabolic Panel:  Recent Labs Lab 02/21/16 1958 02/22/16 0534 02/23/16 0435 02/24/16 0617 02/25/16 0558 02/26/16 0558  NA  --  134* 130* 133* 133* 133*  K  --  3.6 3.7 3.5 3.7 3.8  CL  --  104 103 100* 101 101  CO2  --  24 23 25 24 27   GLUCOSE  --  132* 131* 117* 121* 108*  BUN  --  15 15 20 17 15   CREATININE  --  0.91 0.92 1.13 0.97 1.06  CALCIUM  --  8.3* 7.8* 8.3* 8.4* 8.5*  MG 1.7  --   --   --   --   --    Liver Function Tests:  Recent Labs Lab 02/22/16 0534  AST 32  ALT 23  ALKPHOS 110  BILITOT 1.2  PROT 7.5  ALBUMIN 2.8*   No results for input(s): LIPASE, AMYLASE in the last 168 hours. No results for input(s): AMMONIA in the last 168 hours. CBC:  Recent Labs Lab 02/21/16 1929 02/22/16 0534  02/22/16 2021 02/23/16 0435 02/24/16 0617 02/25/16 0558 02/26/16 0558  WBC 7.0 8.9  --   --  7.3 7.9 7.1 7.0  NEUTROABS 5.1  --   --   --   --   --   --   --   HGB 8.5* 8.1*  --  9.0* 8.4* 8.6* 8.8* 8.3*  HCT 28.6* 27.0*  < > 29.6* 27.5* 29.1* 29.5* 27.6*  MCV 79.2 78.9  --   --  78.3 80.2 79.7 81.7  PLT 164 178  --   --  160 192 202 218  < > = values in this interval not displayed. Cardiac Enzymes:  Recent Labs Lab 02/22/16 0538 02/22/16 1211 02/22/16 1405 02/22/16 2021 02/23/16 0435  TROPONINI 0.08* 0.16* 0.18* 0.16* 0.13*   BNP:  Invalid input(s): POCBNP CBG:  Recent  Labs Lab 02/25/16 1131 02/25/16 1610 02/25/16 2121 02/26/16 0736 02/26/16 1114  GLUCAP 112* 123* 140* 108* 127*   D-Dimer No results for input(s): DDIMER in the last 72 hours. Hgb A1c No results for input(s): HGBA1C in the last 72 hours. Lipid Profile No results for input(s): CHOL, HDL, LDLCALC, TRIG, CHOLHDL, LDLDIRECT in the last 72 hours. Thyroid function studies No results for input(s): TSH, T4TOTAL, T3FREE, THYROIDAB in the last 72 hours.  Invalid input(s): FREET3 Anemia work up No results for input(s): VITAMINB12, FOLATE, FERRITIN, TIBC, IRON, RETICCTPCT in the last 72 hours. Urinalysis    Component Value Date/Time   COLORURINE AMBER (A) 02/21/2016 2145   APPEARANCEUR CLEAR 02/21/2016 2145   LABSPEC >1.030 (H) 02/21/2016 2145   PHURINE 5.5 02/21/2016 2145   GLUCOSEU NEGATIVE 02/21/2016 2145   HGBUR LARGE (A) 02/21/2016 2145   BILIRUBINUR NEGATIVE 02/21/2016 2145   KETONESUR NEGATIVE 02/21/2016 2145   PROTEINUR 100 (A) 02/21/2016 2145   UROBILINOGEN 0.2 04/09/2011 1746   NITRITE NEGATIVE 02/21/2016 2145   LEUKOCYTESUR NEGATIVE 02/21/2016 2145   Sepsis Labs Invalid input(s): PROCALCITONIN,  WBC,  LACTICIDVEN Microbiology Recent Results (from the past 240 hour(s))  Blood culture (routine x 2)     Status: Abnormal   Collection Time: 02/21/16  8:20 PM  Result Value Ref Range Status   Specimen Description RIGHT ANTECUBITAL  Final   Special Requests BOTTLES DRAWN AEROBIC AND ANAEROBIC 6CC  Final   Culture  Setup Time   Final    GRAM POSITIVE COCCI IN CHAINS RECOVERED FROM BOTH AEROBIC AND ANAEROBIC BOTTLES Gram Stain Report Called to,Read Back By and Verified With: COVINTGTON,L. AT 1712 ON 02/22/2016 BY BAUGHAM,M. Performed at [facility]    Culture (A)  Final    MICROAEROPHILIC STREPTOCOCCI Standardized susceptibility testing for this organism is not available. Performed at [facility]    Report Status 02/25/2016 FINAL  Final  Blood culture  (routine x 2)     Status: Abnormal   Collection Time: 02/21/16  8:40 PM  Result Value Ref Range Status   Specimen Description BLOOD RIGHT FOREARM  Final   Special Requests BOTTLES DRAWN AEROBIC AND ANAEROBIC 6CC  Final   Culture  Setup Time   Final    GRAM POSITIVE COCCI IN CHAINS RECOVERED FROM BOTH THE AEROBIC AND ANAEROBIC BOTTLES. Gram Stain Report Called to,Read Back By and Verified With: COVINGTON,L. AT 1712 ON 02/22/2016 BY BAUGHAM,M. Performed at [facility]    Culture (A)  Final    MICROAEROPHILIC STREPTOCOCCI Standardized susceptibility testing for this organism is not available. Performed at [facility]    Report Status 02/25/2016 FINAL  Final  Blood Culture ID Panel (Reflexed)     Status: None   Collection Time: 02/21/16  8:40 PM  Result Value Ref Range Status   Enterococcus species NOT DETECTED NOT DETECTED Final   Vancomycin resistance NOT DETECTED NOT DETECTED Final   Listeria monocytogenes NOT DETECTED NOT DETECTED Final   Staphylococcus species NOT DETECTED NOT DETECTED Final   Staphylococcus aureus NOT DETECTED NOT DETECTED Final   Methicillin resistance NOT DETECTED NOT DETECTED Final   Streptococcus species NOT DETECTED NOT DETECTED Final   Streptococcus agalactiae NOT DETECTED NOT DETECTED Final   Streptococcus pneumoniae NOT DETECTED NOT DETECTED Final   Streptococcus pyogenes NOT DETECTED NOT DETECTED Final   Acinetobacter baumannii NOT DETECTED NOT DETECTED Final   Enterobacteriaceae species NOT DETECTED NOT DETECTED Final  Enterobacter cloacae complex NOT DETECTED NOT DETECTED Final   Escherichia coli NOT DETECTED NOT DETECTED Final   Klebsiella oxytoca NOT DETECTED NOT DETECTED Final   Klebsiella pneumoniae NOT DETECTED NOT DETECTED Final   Proteus species NOT DETECTED NOT DETECTED Final   Serratia marcescens NOT DETECTED NOT DETECTED Final   Carbapenem resistance NOT DETECTED NOT DETECTED Final   Haemophilus influenzae NOT DETECTED NOT  DETECTED Final   Neisseria meningitidis NOT DETECTED NOT DETECTED Final   Pseudomonas aeruginosa NOT DETECTED NOT DETECTED Final   Candida albicans NOT DETECTED NOT DETECTED Final   Candida glabrata NOT DETECTED NOT DETECTED Final   Candida krusei NOT DETECTED NOT DETECTED Final   Candida parapsilosis NOT DETECTED NOT DETECTED Final   Candida tropicalis NOT DETECTED NOT DETECTED Final    Comment: Performed at [facility]     SIGNED:   Jerald Kief, MD  [facility] 02/26/2016, 5:44 PM  If 7PM-7AM, please contact night-coverage [email or web address] Password [email or web address]

## 2016-02-26 NOTE — Care Management Note (Signed)
Case Management Note  Patient Details  Name: Lysle PearlSteven C Burnett MRN: [id number] Date of Birth: 02/23/1951   Expected Discharge Date:      02/26/2016            Expected Discharge Plan:  Home w Home Health Services  In-House Referral:  NA  Discharge planning Services  CM Consult  Post Acute Care Choice:  Home Health Choice offered to:  Patient  DME Arranged:    DME Agency:     HH Arranged:  RN HH Agency:  [facility]  Status of Service:  Completed, signed off  If discussed at Long Length of Stay Meetings, dates discussed:    Additional Comments: [facility]. Pt has used [facility] in the recent past and would like to use them again. Alroy BailiffLinda Lothian of [facility], made aware of referral and will obtain pt info from chart. Pt will receive todays dose here and HH to give first dose on 9/5.   Malcolm Metrohildress, Sheila Ocasio Demske, RN 02/26/2016, 10:02 AM

## 2016-02-26 NOTE — Discharge Instructions (Signed)
Weakness °Weakness is a lack of strength. You may feel weak all over your body or just in one part of your body. Weakness can be serious. In some cases, you may need more medical tests. °HOME CARE °· Rest. °· Eat a well-balanced diet. °· Try to exercise every day. °· Only take medicines as told by your doctor. °GET HELP RIGHT AWAY IF:  °· You cannot do your normal daily activities. °· You cannot walk up and down stairs, or you feel very tired when you do so. °· You have shortness of breath or chest pain. °· You have trouble moving parts of your body. °· You have weakness in only one body part or on only one side of the body. °· You have a fever. °· You have trouble speaking or swallowing. °· You cannot control when you pee (urinate) or poop (bowel movement). °· You have black or bloody throw up (vomit) or poop. °· Your weakness gets worse or spreads to other body parts. °· You have new aches or pains. °MAKE SURE YOU:  °· Understand these instructions. °· Will watch your condition. °· Will get help right away if you are not doing well or get worse. °  °This information is not intended to replace advice given to you by your health care provider. Make sure you discuss any questions you have with your health care provider. °  °Document Released: 05/23/2008 Document Revised: 12/10/2011 Document Reviewed: 08/09/2011 °Elsevier Interactive Patient Education ©2016 [employer] Inc[employer] ° °

## 2016-02-26 NOTE — Care Management Important Message (Signed)
Important Message  Patient Details  Name: Lysle PearlSteven C Meegan MRN: [id number] Date of Birth: 08/30/1950   Medicare Important Message Given:  Yes    Malcolm MetroChildress, Happy Ky Demske, RN 02/26/2016, 10:11 AM

## 2016-02-26 NOTE — Progress Notes (Signed)
Nutrition Follow-up  DOCUMENTATION CODES:  Obesity Class II   INTERVENTION:  Recommend continue Ensure Enlive po BID for 30 days after discharge home  Heart Healthy diet    NUTRITION DIAGNOSIS:   Inadequate oral intake related to poor appetite as evidenced by per patient/family report; improved now that his breathing is better per pt.  GOAL:   Patient will meet greater than or equal to 90% of their needs; progressing.  MONITOR:   PO intake, Supplement acceptance, Labs, Weight trends   ASSESSMENT: Patient presents to ED complaining of increased fatigue and shortness of breath. His troponin is elevated and he has hx of chronic anemia and CHF.   Patient is sitting up on side of bed finishing his breakfast. He says he's eating better now that his breathing has improved. He has eaten 100% of his eggs and toast and is working on his juice. Encouraged him to weigh himself daily at home and emphasized again the importance of him following consistent daily protein intake and a low sodium diet.   Recent Labs Lab 02/21/16 1958  02/24/16 0617 02/25/16 0558 02/26/16 0558  NA  --   < > 133* 133* 133*  K  --   < > 3.5 3.7 3.8  CL  --   < > 100* 101 101  CO2  --   < > 25 24 27   BUN  --   < > 20 17 15   CREATININE  --   < > 1.13 0.97 1.06  CALCIUM  --   < > 8.3* 8.4* 8.5*  MG 1.7  --   --   --   --   GLUCOSE  --   < > 117* 121* 108*  < > = values in this interval not displayed.  Labs: sodium 133, Glucose 108.   Meds/supplements: vitamin D, vitamin C, folic acid, Iron  Diet Order:  Diet heart healthy/carb modified Room service appropriate? Yes; Fluid consistency: Thin  Skin:  Reviewed, no issues  Last BM:  02/25/16  Height:   Ht Readings from Last 1 Encounters:  02/21/16 5\' 11"  (1.803 m)    Weight:   Wt Readings from Last 1 Encounters:  02/26/16 266 lb 9.6 oz (120.9 kg)    Ideal Body Weight:  78 kg  BMI:  Body mass index is 37.18 kg/m. obesity class II  Estimated  Nutritional Needs:   Kcal:  [id number]  Protein:  125-150 gr  Fluid:  1.8-1.9 liters daily   EDUCATION NEEDS:   No education needs identified at this time    Royann ShiversLynn AmeLie Hollars MS,RD,CSG,LDN Office: #[phone] Pager: [phone]

## 2016-02-26 NOTE — Progress Notes (Signed)
[facility]  Patient Status: New  [facility] is providing the following services: RN and Home Infusion Services (teaching and education will be done by nurse in the home with patient and caregiver)  If patient discharges after hours, please call [phone].   Alroy BailiffLinda Lothian 02/26/2016, 10:12 AM

## 2016-02-28 ENCOUNTER — Telehealth: Payer: Self-pay | Admitting: Pharmacist Clinician (PhC)/ Clinical Pharmacy Specialist

## 2016-02-28 NOTE — Telephone Encounter (Signed)
Received a message from Dr. Drue SecondSnider about this pt. 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 He finished abx mid-late June. TEE neg again this time, plan is to extend his abx to 6 wks or until can be seen in clinic. [facility] Rosalita Chessman(Suzanne) is aware now to extend to 6 wks until determine when seen in clinic.

## 2016-02-29 ENCOUNTER — Encounter (HOSPITAL_COMMUNITY): Payer: Self-pay | Admitting: Internal Medicine

## 2016-03-07 ENCOUNTER — Inpatient Hospital Stay: Payer: Medicare Other | Admitting: Internal Medicine

## 2016-03-12 ENCOUNTER — Telehealth: Payer: Self-pay | Admitting: *Deleted

## 2016-03-12 ENCOUNTER — Telehealth: Payer: Self-pay | Admitting: Pharmacist

## 2016-03-12 NOTE — Telephone Encounter (Signed)
Call from Ernesto RutherfordLinda Walker, RN with [facility] called to report a hemoglobin of 7.5 on patient. She said he is pale and not feeling well. Per Dr. Drue SecondSnider the CBC should be repeated in 2 days, RN notified. Wendall MolaJacqueline Cockerham

## 2016-03-12 NOTE — Telephone Encounter (Signed)
Received lab work on [name] [name] - hgb 7.5. He has chronic iron deficiency anemia. Seems his hemoglobin stays in the 7-8.5 range. Spoke with Dr. Luciana Axeomer who advised to monitor patient and recheck next week. [name] [name] called me this afternoon stating he was very lethargic and feeling awful.  I advised patient to go to the ED to receive blood and he asked if [facility].  I spoke with Tammy RN, Marcelino DusterMichelle RN, and Dr. Daiva EvesVan Dam who also advised patient to go to ED. Patient refused to go and said he would call back tomorrow for more help and advice.  I continued to tell him that if he was feeling so bad that he needed to go to the ED to get iron and blood. Will follow up with patient tomorrow.   [name] [name], BS, PharmD Infectious Diseases Clinical Pharmacist [facility] 03/12/2016, 3:58 PM

## 2016-03-13 ENCOUNTER — Other Ambulatory Visit: Payer: Medicare Other

## 2016-03-13 ENCOUNTER — Other Ambulatory Visit: Payer: Self-pay | Admitting: Internal Medicine

## 2016-03-13 DIAGNOSIS — D508 Other iron deficiency anemias: Secondary | ICD-10-CM

## 2016-03-13 LAB — CBC
HEMATOCRIT: 26.1 % — AB (ref 38.5–50.0)
HEMOGLOBIN: 7.8 g/dL — AB (ref 13.2–17.1)
MCH: 24.1 pg — ABNORMAL LOW (ref 27.0–33.0)
MCHC: 29.9 g/dL — AB (ref 32.0–36.0)
MCV: 80.8 fL (ref 80.0–100.0)
MPV: 10 fL (ref 7.5–12.5)
Platelets: 197 10*3/uL (ref 140–400)
RBC: 3.23 MIL/uL — ABNORMAL LOW (ref 4.20–5.80)
RDW: 21 % — AB (ref 11.0–15.0)
WBC: 7.5 10*3/uL (ref 3.8–10.8)

## 2016-03-13 NOTE — Telephone Encounter (Signed)
He is coming in this afternoon for repeat labs, including type and screen, and anemia work up. Please give lab the head's up. I will place orders in computer

## 2016-03-13 NOTE — Progress Notes (Signed)
Patient having worsening anemia while on abtx, will do anemia work up plus type and screen in the event he needs blood tsf

## 2016-03-13 NOTE — Telephone Encounter (Signed)
Patient added to lab schedule.

## 2016-03-14 ENCOUNTER — Telehealth: Payer: Self-pay | Admitting: Pharmacist

## 2016-03-14 LAB — BILIRUBIN, DIRECT: Bilirubin, Direct: 0.2 mg/dL (ref <=0.2)

## 2016-03-14 LAB — IRON AND TIBC
%SAT: 9 % — AB (ref 15–60)
Iron: 32 ug/dL — ABNORMAL LOW (ref 50–180)
TIBC: 337 ug/dL (ref 250–425)
UIBC: 305 ug/dL (ref 125–400)

## 2016-03-14 LAB — FERRITIN: Ferritin: 182 ng/mL (ref 20–380)

## 2016-03-14 LAB — HAPTOGLOBIN: Haptoglobin: <15 mg/dL — ABNORMAL LOW (ref 43–212)

## 2016-03-14 LAB — LACTATE DEHYDROGENASE: LDH: 380 U/L — ABNORMAL HIGH (ref 120–250)

## 2016-03-14 LAB — BILIRUBIN, TOTAL: Total Bilirubin: 0.6 mg/dL (ref 0.2–1.2)

## 2016-03-14 NOTE — Telephone Encounter (Signed)
Called [facility] and arranged for [name] [name] to get 1 unit of PRBC tomorrow 9/22 at 8am. Called patient and informed him and he will arrive at 8am.  Faxed over necessary paperwork to [facility].

## 2016-03-15 ENCOUNTER — Ambulatory Visit (HOSPITAL_COMMUNITY)
Admission: RE | Admit: 2016-03-15 | Discharge: 2016-03-15 | Disposition: A | Discharge status: Home / Self Care | Payer: Medicare Other | Source: Ambulatory Visit | Attending: Family Medicine | Admitting: Family Medicine

## 2016-03-15 DIAGNOSIS — D509 Iron deficiency anemia, unspecified: Secondary | ICD-10-CM | POA: Diagnosis not present

## 2016-03-15 DIAGNOSIS — I38 Endocarditis, valve unspecified: Secondary | ICD-10-CM | POA: Insufficient documentation

## 2016-03-15 LAB — PREPARE RBC (CROSSMATCH)

## 2016-03-15 MED ORDER — HEPARIN SOD (PORK) LOCK FLUSH 100 UNIT/ML IV SOLN
250.0000 [IU] | INTRAVENOUS | Status: AC | PRN
  Administered 2016-03-15: 250 [IU]
  Filled 2016-03-15: qty 5

## 2016-03-15 MED ORDER — DIPHENHYDRAMINE HCL 50 MG/ML IJ SOLN: 25.0000 mg | INTRAMUSCULAR | Status: DC | PRN

## 2016-03-15 MED ORDER — SODIUM CHLORIDE 0.9 % IV SOLN: Freq: Once | INTRAVENOUS | Status: AC

## 2016-03-15 MED ORDER — SODIUM CHLORIDE 0.9% FLUSH
10.0000 mL | INTRAVENOUS | Status: AC | PRN
  Administered 2016-03-15: 10 mL

## 2016-03-15 MED ORDER — SODIUM CHLORIDE 0.9 % IV SOLN: Freq: Once | INTRAVENOUS | Status: DC

## 2016-03-15 MED ORDER — ACETAMINOPHEN 325 MG PO TABS: 650.0000 mg | ORAL_TABLET | ORAL | Status: DC | PRN

## 2016-03-15 MED ORDER — DIPHENHYDRAMINE HCL 50 MG/ML IJ SOLN: 25.0000 mg | Freq: Once | INTRAMUSCULAR | Status: DC

## 2016-03-15 NOTE — Discharge Instructions (Signed)
Blood Transfusion, Care After  These instructions give you information about caring for yourself after your procedure. Your doctor may also give you more specific instructions. Call your doctor if you have any problems or questions after your procedure.   HOME CARE   Take medicines only as told by your doctor. Ask your doctor if you can take an over-the-counter pain reliever if you have a fever or headache a day or two after your procedure.   Return to your normal activities as told by your doctor.  GET HELP IF:    You develop redness or irritation at your IV site.   You have a fever, chills, or a headache that does not go away.   Your pee (urine) is darker than normal.   Your urine turns:    Pink.    Red.    Brown.   The white part of your eye turns yellow (jaundice).   You feel weak after doing your normal activities.  GET HELP RIGHT AWAY IF:    You have trouble breathing.   You have fever and chills and you also have:    Anxiety.    Chest or back pain.    Flushed or pink skin.    Clammy or sweaty skin.    A fast heartbeat.    A sick feeling in your stomach (nausea).     This information is not intended to replace advice given to you by your health care provider. Make sure you discuss any questions you have with your health care provider.     Document Released: 07/01/2014 Document Reviewed: 07/01/2014  Elsevier Interactive Patient Education 2016 [employer]

## 2016-03-15 NOTE — Progress Notes (Signed)
Provider: Judyann Munsonynthia Snider NP  Associated Diagnosis: Endocarditis and anemia  Procedure: Type and screen and transfusion of 1 unit PRBC's as ordered.  Patient tolerated transfusion well. No reaction. No itching. Went over discharge instructions and copy given to patient. Alert, oriented and ambulatory at time of discharge.

## 2016-03-16 LAB — TYPE AND SCREEN
ABO/RH(D): A POS
ANTIBODY SCREEN: NEGATIVE
Unit division: 0

## 2016-03-25 ENCOUNTER — Encounter: Payer: Self-pay | Admitting: Cardiovascular Disease

## 2016-03-25 ENCOUNTER — Encounter: Payer: Self-pay | Admitting: Internal Medicine

## 2016-03-27 ENCOUNTER — Telehealth: Payer: Self-pay | Admitting: Cardiovascular Disease

## 2016-03-27 MED ORDER — RIVAROXABAN 20 MG PO TABS: 20.0000 mg | ORAL_TABLET | Freq: Every day | ORAL | 0 refills | Status: AC

## 2016-03-27 NOTE — Telephone Encounter (Signed)
Patient was told to call our office to see if we had samples for rivaroxaban (XARELTO) 20 MG TABS tablet

## 2016-03-27 NOTE — Telephone Encounter (Signed)
Patient notified via voice mail that samples are ready for pick up.  Also, left info regarding contacting [facility] for assistance if necessary.

## 2016-03-28 ENCOUNTER — Encounter: Payer: Self-pay | Admitting: *Deleted

## 2016-04-08 ENCOUNTER — Other Ambulatory Visit: Payer: Self-pay | Admitting: *Deleted

## 2016-04-08 ENCOUNTER — Telehealth: Payer: Self-pay | Admitting: *Deleted

## 2016-04-08 ENCOUNTER — Encounter: Payer: Self-pay | Admitting: Internal Medicine

## 2016-04-08 DIAGNOSIS — D508 Other iron deficiency anemias: Secondary | ICD-10-CM

## 2016-04-08 NOTE — Telephone Encounter (Signed)
Per Dr Drue SecondSnider referral made for the patient to Hematology. Called him to advise him of this and to expect a call about an appointment. Patient was not very receptive but said as long as his schedule permits he will comply.

## 2016-04-08 NOTE — Telephone Encounter (Signed)
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

## 2016-04-09 ENCOUNTER — Telehealth: Payer: Self-pay | Admitting: Pharmacist

## 2016-04-09 NOTE — Telephone Encounter (Signed)
[facility] hgb [facility] 7.4. This is chronic for [name] [name].  He has received blood transfusions in the past. Tried to call patient three times yesterday and twice this morning to see if he was symptomatic with no answer to phone calls. Arsenio KatzAppears Travis, CMA called pt yesterday and made referral to hematology and pt will have appt there soon. Also, pt has appt with Dr. Ninetta LightsHatcher on Thursday. Will follow labs.

## 2016-04-11 ENCOUNTER — Inpatient Hospital Stay: Payer: Medicare Other | Admitting: Infectious Diseases

## 2016-04-11 ENCOUNTER — Telehealth: Payer: Self-pay | Admitting: *Deleted

## 2016-04-11 NOTE — Telephone Encounter (Signed)
Relayed verbal order per Dr Ninetta LightsHatcher to Eunice Blaseebbie at [facility] to pull PICC. Andree CossHowell, Lovelle Deitrick M, RN

## 2016-04-17 ENCOUNTER — Encounter: Payer: Self-pay | Admitting: Infectious Diseases

## 2016-04-20 ENCOUNTER — Encounter (HOSPITAL_COMMUNITY): Payer: Self-pay | Admitting: Emergency Medicine

## 2016-04-20 ENCOUNTER — Observation Stay (HOSPITAL_COMMUNITY)
Admission: EM | Admit: 2016-04-20 | Discharge: 2016-04-21 | Disposition: A | Discharge status: Home / Self Care | Payer: Medicare Other | Attending: Internal Medicine | Admitting: Internal Medicine

## 2016-04-20 ENCOUNTER — Emergency Department (HOSPITAL_COMMUNITY): Payer: Medicare Other

## 2016-04-20 DIAGNOSIS — I509 Heart failure, unspecified: Secondary | ICD-10-CM | POA: Insufficient documentation

## 2016-04-20 DIAGNOSIS — R0602 Shortness of breath: Secondary | ICD-10-CM

## 2016-04-20 DIAGNOSIS — F329 Major depressive disorder, single episode, unspecified: Secondary | ICD-10-CM | POA: Diagnosis present

## 2016-04-20 DIAGNOSIS — I2510 Atherosclerotic heart disease of native coronary artery without angina pectoris: Secondary | ICD-10-CM | POA: Insufficient documentation

## 2016-04-20 DIAGNOSIS — F32A Depression, unspecified: Secondary | ICD-10-CM | POA: Diagnosis present

## 2016-04-20 DIAGNOSIS — Z8679 Personal history of other diseases of the circulatory system: Secondary | ICD-10-CM

## 2016-04-20 DIAGNOSIS — Z7984 Long term (current) use of oral hypoglycemic drugs: Secondary | ICD-10-CM | POA: Insufficient documentation

## 2016-04-20 DIAGNOSIS — Z7982 Long term (current) use of aspirin: Secondary | ICD-10-CM | POA: Diagnosis not present

## 2016-04-20 DIAGNOSIS — I110 Hypertensive heart disease with heart failure: Secondary | ICD-10-CM | POA: Diagnosis not present

## 2016-04-20 DIAGNOSIS — Z87891 Personal history of nicotine dependence: Secondary | ICD-10-CM | POA: Diagnosis not present

## 2016-04-20 DIAGNOSIS — E119 Type 2 diabetes mellitus without complications: Secondary | ICD-10-CM | POA: Insufficient documentation

## 2016-04-20 DIAGNOSIS — E039 Hypothyroidism, unspecified: Secondary | ICD-10-CM | POA: Diagnosis present

## 2016-04-20 DIAGNOSIS — I5033 Acute on chronic diastolic (congestive) heart failure: Secondary | ICD-10-CM | POA: Diagnosis present

## 2016-04-20 DIAGNOSIS — D649 Anemia, unspecified: Secondary | ICD-10-CM | POA: Diagnosis not present

## 2016-04-20 DIAGNOSIS — Z79899 Other long term (current) drug therapy: Secondary | ICD-10-CM | POA: Diagnosis not present

## 2016-04-20 DIAGNOSIS — E785 Hyperlipidemia, unspecified: Secondary | ICD-10-CM | POA: Diagnosis present

## 2016-04-20 DIAGNOSIS — E1159 Type 2 diabetes mellitus with other circulatory complications: Secondary | ICD-10-CM | POA: Diagnosis present

## 2016-04-20 DIAGNOSIS — I10 Essential (primary) hypertension: Secondary | ICD-10-CM | POA: Diagnosis present

## 2016-04-20 HISTORY — DX: Anemia, unspecified: D64.9

## 2016-04-20 LAB — GLUCOSE, CAPILLARY: GLUCOSE-CAPILLARY: 135 mg/dL — AB (ref 65–99)

## 2016-04-20 LAB — CBC WITH DIFFERENTIAL/PLATELET
BASOS PCT: 0 %
Basophils Absolute: 0 10*3/uL (ref 0.0–0.1)
EOS ABS: 0.1 10*3/uL (ref 0.0–0.7)
Eosinophils Relative: 1 %
HEMATOCRIT: 25.8 % — AB (ref 39.0–52.0)
Hemoglobin: 7.4 g/dL — ABNORMAL LOW (ref 13.0–17.0)
Lymphocytes Relative: 12 %
Lymphs Abs: 1.2 10*3/uL (ref 0.7–4.0)
MCH: 22.3 pg — ABNORMAL LOW (ref 26.0–34.0)
MCHC: 28.7 g/dL — AB (ref 30.0–36.0)
MCV: 77.7 fL — ABNORMAL LOW (ref 78.0–100.0)
MONO ABS: 0.3 10*3/uL (ref 0.1–1.0)
MONOS PCT: 3 %
NEUTROS ABS: 7.9 10*3/uL — AB (ref 1.7–7.7)
Neutrophils Relative %: 84 %
Platelets: 232 10*3/uL (ref 150–400)
RBC: 3.32 MIL/uL — ABNORMAL LOW (ref 4.22–5.81)
RDW: 18.6 % — AB (ref 11.5–15.5)
WBC: 9.4 10*3/uL (ref 4.0–10.5)

## 2016-04-20 LAB — BASIC METABOLIC PANEL
Anion gap: 7 (ref 5–15)
BUN: 31 mg/dL — ABNORMAL HIGH (ref 6–20)
CALCIUM: 8.4 mg/dL — AB (ref 8.9–10.3)
CO2: 25 mmol/L (ref 22–32)
CREATININE: 1.55 mg/dL — AB (ref 0.61–1.24)
Chloride: 101 mmol/L (ref 101–111)
GFR calc non Af Amer: 45 mL/min — ABNORMAL LOW (ref >60)
GFR, EST AFRICAN AMERICAN: 52 mL/min — AB (ref >60)
Glucose, Bld: 130 mg/dL — ABNORMAL HIGH (ref 65–99)
Potassium: 3.6 mmol/L (ref 3.5–5.1)
Sodium: 133 mmol/L — ABNORMAL LOW (ref 135–145)

## 2016-04-20 LAB — PREPARE RBC (CROSSMATCH)

## 2016-04-20 LAB — MAGNESIUM: MAGNESIUM: 1.9 mg/dL (ref 1.7–2.4)

## 2016-04-20 LAB — BRAIN NATRIURETIC PEPTIDE: B Natriuretic Peptide: 1471 pg/mL — ABNORMAL HIGH (ref 0.0–100.0)

## 2016-04-20 MED ORDER — SODIUM CHLORIDE 0.9% FLUSH
3.0000 mL | Freq: Two times a day (BID) | INTRAVENOUS | Status: DC
  Administered 2016-04-20: 23:00:00 via INTRAVENOUS

## 2016-04-20 MED ORDER — ASPIRIN EC 325 MG PO TBEC
325.0000 mg | DELAYED_RELEASE_TABLET | Freq: Every evening | ORAL | Status: DC
  Administered 2016-04-20: 325 mg via ORAL
  Filled 2016-04-20: qty 1

## 2016-04-20 MED ORDER — FERROUS SULFATE-C-FA ER 105-500-0.8 MG PO TBCR: 500.0000 mg | EXTENDED_RELEASE_TABLET | Freq: Every day | ORAL | Status: DC

## 2016-04-20 MED ORDER — METFORMIN HCL ER 500 MG PO TB24
500.0000 mg | ORAL_TABLET | Freq: Every day | ORAL | Status: DC
  Administered 2016-04-20: 500 mg via ORAL
  Filled 2016-04-20: qty 1

## 2016-04-20 MED ORDER — PANTOPRAZOLE SODIUM 40 MG PO TBEC
40.0000 mg | DELAYED_RELEASE_TABLET | Freq: Every day | ORAL | Status: DC
  Administered 2016-04-20 – 2016-04-21 (×2): 40 mg via ORAL
  Filled 2016-04-20 (×2): qty 1

## 2016-04-20 MED ORDER — INFLUENZA VAC SPLIT QUAD 0.5 ML IM SUSY
0.5000 mL | PREFILLED_SYRINGE | INTRAMUSCULAR | Status: AC
  Administered 2016-04-21: 0.5 mL via INTRAMUSCULAR
  Filled 2016-04-20: qty 0.5

## 2016-04-20 MED ORDER — ONDANSETRON HCL 4 MG/2ML IJ SOLN: 4.0000 mg | Freq: Four times a day (QID) | INTRAMUSCULAR | Status: DC | PRN

## 2016-04-20 MED ORDER — FERROUS SULFATE 325 (65 FE) MG PO TABS
325.0000 mg | ORAL_TABLET | Freq: Every day | ORAL | Status: DC
  Administered 2016-04-21: 325 mg via ORAL
  Filled 2016-04-20: qty 1

## 2016-04-20 MED ORDER — FLUOXETINE HCL 20 MG PO CAPS
40.0000 mg | ORAL_CAPSULE | Freq: Every day | ORAL | Status: DC
  Administered 2016-04-21: 40 mg via ORAL
  Filled 2016-04-20: qty 2

## 2016-04-20 MED ORDER — LISINOPRIL 10 MG PO TABS
10.0000 mg | ORAL_TABLET | Freq: Every day | ORAL | Status: DC
  Filled 2016-04-20: qty 1

## 2016-04-20 MED ORDER — FUROSEMIDE 10 MG/ML IJ SOLN
40.0000 mg | Freq: Once | INTRAMUSCULAR | Status: AC
  Administered 2016-04-20: 40 mg via INTRAVENOUS
  Filled 2016-04-20: qty 4

## 2016-04-20 MED ORDER — DIAZEPAM 5 MG PO TABS
5.0000 mg | ORAL_TABLET | Freq: Every day | ORAL | Status: DC
  Administered 2016-04-20: 5 mg via ORAL
  Filled 2016-04-20: qty 1

## 2016-04-20 MED ORDER — VITAMIN D 1000 UNITS PO TABS
5000.0000 [IU] | ORAL_TABLET | Freq: Every day | ORAL | Status: DC
  Administered 2016-04-21: 5000 [IU] via ORAL

## 2016-04-20 MED ORDER — LEVOTHYROXINE SODIUM 112 MCG PO TABS
112.0000 ug | ORAL_TABLET | Freq: Every day | ORAL | Status: DC
  Administered 2016-04-21: 112 ug via ORAL
  Filled 2016-04-20: qty 1

## 2016-04-20 MED ORDER — ENSURE ENLIVE PO LIQD
237.0000 mL | Freq: Two times a day (BID) | ORAL | Status: DC
  Administered 2016-04-21: 237 mL via ORAL

## 2016-04-20 MED ORDER — SODIUM CHLORIDE 0.9 % IV SOLN
INTRAVENOUS | Status: DC
  Administered 2016-04-21: 07:00:00 via INTRAVENOUS

## 2016-04-20 MED ORDER — HYDROCODONE-ACETAMINOPHEN 5-325 MG PO TABS: 1.0000 | ORAL_TABLET | Freq: Four times a day (QID) | ORAL | Status: DC | PRN

## 2016-04-20 MED ORDER — RIVAROXABAN 20 MG PO TABS
20.0000 mg | ORAL_TABLET | Freq: Every day | ORAL | Status: DC
  Administered 2016-04-21: 20 mg via ORAL
  Filled 2016-04-20: qty 1

## 2016-04-20 MED ORDER — SIMVASTATIN 20 MG PO TABS
40.0000 mg | ORAL_TABLET | Freq: Every day | ORAL | Status: DC
  Administered 2016-04-20: 40 mg via ORAL
  Filled 2016-04-20: qty 2

## 2016-04-20 MED ORDER — ONDANSETRON HCL 4 MG PO TABS: 4.0000 mg | ORAL_TABLET | Freq: Four times a day (QID) | ORAL | Status: DC | PRN

## 2016-04-20 MED ORDER — SODIUM CHLORIDE 0.9 % IV SOLN
10.0000 mL/h | Freq: Once | INTRAVENOUS | Status: AC
  Administered 2016-04-21: 10 mL/h via INTRAVENOUS

## 2016-04-20 MED ORDER — SODIUM CHLORIDE 0.9 % IV SOLN: INTRAVENOUS | Status: DC

## 2016-04-20 NOTE — ED Provider Notes (Signed)
[facility]-EMERGENCY DEPT Provider Note   CSN: [id number] Arrival date & time: 04/20/16  1745     History   Chief Complaint Chief Complaint  Patient presents with  . Shortness of Breath    HPI [name] [name] is a 65 y.o. male.  Patient was told that his hemoglobin was low on October 17. But now is getting symptomatic. Patient recently had a blood transfusion early October. Looks like hemoglobin is usually kept in the 8 range. He was told it was down 7 range. Patient now having exertional shortness of breath no chest pain no fevers some lightheadedness no syncope. Patient also feeling fatigued.      Past Medical History:  Diagnosis Date  . Anemia   . 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 Arthritis   . CHF (congestive heart failure) (HCC)   . Coronary artery disease (CAD) excluded 2008, 2011   Mild coronary plaque.  w/ normal LV function. repeated cardiac cath on 07/2009  . Deep venous thrombosis (HCC)    recurrent DVT and PE, status post IVC filter  . Depression   . Diabetes mellitus   . Dyspnea    with exertion  . Endocarditis   . GERD (gastroesophageal reflux disease)   . Heart murmur   . History of endocarditis    status post Enterococcus faecalis, secondary to chronic injections with low molecular weight heparin  . Hyperlipidemia   . Hypertension   . 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 Obesity   . OSA (obstructive sleep apnea)    CPAP  . 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 Restless leg syndrome     Patient Active Problem List   Diagnosis Date Noted  . Anemia 04/20/2016  . Chronic anticoagulation 12/05/2015  . Chronic anemia 12/05/2015  . Endocarditis   . Poor dentition   . Streptococcal bacteremia 11/04/2015  . Unintentional weight loss 11/04/2015  . Elevated troponin I level 11/03/2015  . Absolute anemia 11/03/2015  . Unexplained weight  loss 11/02/2015  . Fatigue 11/02/2015  . Chills 11/02/2015  . 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 Seasonal and perennial allergic rhinitis 10/13/2015  . Osteoarthritis of right hip 08/10/2015  . 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 Uncontrolled type 2 diabetes mellitus (HCC) 04/28/2015  . Primary hypothyroidism 04/28/2015  . Hyperlipidemia 04/28/2015  . 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 Varicose vein 01/24/2012  . RLS (restless legs syndrome) 07/11/2011  . History of DVT (deep vein thrombosis)   . History of endocarditis   . Coronary artery disease (CAD) excluded   . Extrasystole 04/30/2011  . S/P AVR (aortic valve replacement) 04/10/2011  . S/P mitral valve repair 04/10/2011  . Mitral regurgitation 03/29/2011  . Aortic insufficiency   . Dyspnea 11/15/2010  . Morbid obesity (HCC) 02/20/2009  . DEPRESSION 02/20/2009  . Obstructive sleep apnea 02/20/2009  . Essential hypertension 02/20/2009  . SKIN LESION 02/20/2009  . 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 AORTIC VALVE REPLACEMENT  04/10/2011   25mm [facility] Magna Ease pericardial bioprosthetic aortic valve  . CARDIAC CATHETERIZATION  2008  . CARDIAC CATHETERIZATION  07/2009   minor irregulreties without obstructive disease  . COLONOSCOPY W/ POLYPECTOMY    . ESOPHAGOGASTRODUODENOSCOPY ENDOSCOPY    . IVC FILTER PLACEMENT ([facility] HX)    .  MITRAL VALVE REPAIR  04/10/2011   autologous pericardial patch augmentation of posterior leaflet with 28mm Sorin Memo 3D ring annuloplasty  . TEE WITHOUT CARDIOVERSION N/A 11/06/2015   Procedure: TRANSESOPHAGEAL ECHOCARDIOGRAM (TEE);  Surgeon: Chilton Siiffany Monroe, MD;  [facility];  Service: Cardiovascular;  Laterality: N/A;  . TEE WITHOUT CARDIOVERSION N/A 02/23/2016   Procedure: TRANSESOPHAGEAL ECHOCARDIOGRAM (TEE);  Surgeon: Pricilla RifflePaula V Ross, MD;  Location: [facility]  ENDO SUITE;  Service: Cardiovascular;  Laterality: N/A;  . THYROIDECTOMY    . TOTAL HIP ARTHROPLASTY Left 08/10/2015   Procedure: LEFT TOTAL HIP ARTHROPLASTY ANTERIOR APPROACH;  Surgeon: Kathryne Hitchhristopher Y Blackman, MD;  Location: [facility] OR;  Service: Orthopedics;  Laterality: Left;       Home Medications    Prior to Admission medications   Medication Sig Start Date End Date Taking? Authorizing Provider  acetaminophen (TYLENOL) 650 MG CR tablet Take 1,300 mg by mouth daily as needed for pain.     Historical Provider, MD  amitriptyline (ELAVIL) 50 MG tablet Take 50 mg by mouth at bedtime. 12/19/15   Historical Provider, MD  aspirin (ECOTRIN) 325 MG EC tablet Take 325 mg by mouth daily.    Historical Provider, MD  cefTRIAXone 2 g in dextrose 5 % 50 mL Inject 2 g into the vein daily. 02/27/16   Jerald KiefStephen K Chiu, MD  Cholecalciferol (VITAMIN D3) 5000 units CAPS Take 1 capsule by mouth daily.    Historical Provider, MD  diazepam (VALIUM) 5 MG tablet Take 5 mg by mouth at bedtime.  06/01/14   Historical Provider, MD  Ferrous Sulfate-C-Folic Acid 105-500-0.8 MG TBCR Take 500 mg by mouth daily.    Historical Provider, MD  FLUoxetine (PROZAC) 40 MG capsule Take 40 mg by mouth daily.    Historical Provider, MD  furosemide (LASIX) 20 MG tablet Take 1 tablet (20 mg total) by mouth daily. 02/26/16   Jerald KiefStephen K Chiu, MD  HYDROcodone-acetaminophen (NORCO/VICODIN) 5-325 MG tablet take 1 tablet by mouth every 6 hours if needed for SEVERE PAIN 09/20/15   Historical Provider, MD  levothyroxine (SYNTHROID, LEVOTHROID) 112 MCG tablet Take 112 mcg by mouth daily before breakfast.  10/26/15   Historical Provider, MD  lisinopril (PRINIVIL,ZESTRIL) 10 MG tablet Take 10 mg by mouth daily.    Historical Provider, MD  metFORMIN (GLUCOPHAGE-XR) 500 MG 24 hr tablet Take 500 mg by mouth 2 (two) times daily. 10/22/15   Historical Provider, MD  metoprolol tartrate (LOPRESSOR) 25 MG tablet take 1 tablet by mouth once daily Patient taking  differently: take 1 tablet by mouth once daily in the evening 12/25/15   Wendall StadePeter C Nishan, MD  omeprazole (PRILOSEC) 20 MG capsule Take 20 mg by mouth at bedtime.     Historical Provider, MD  rivaroxaban (XARELTO) 20 MG TABS tablet Take 1 tablet (20 mg total) by mouth daily with supper. 03/27/16   Wendall StadePeter C Nishan, MD  simvastatin (ZOCOR) 40 MG tablet take 1 tablet by mouth at bedtime 01/25/16   Wendall StadePeter C Nishan, MD    Family History Family History  Problem Relation Age of Onset  . Hypertension Father   . Hyperlipidemia Father   . CAD Father   . Diabetes Mother   . Hypertension Mother   . Cancer Mother   . CAD Mother   . Coronary artery disease Mother   . Stroke Mother   . Kidney disease Mother   . Hyperlipidemia Mother   . Diabetes Maternal Grandmother   . Diabetes Maternal Grandfather   .  Diabetes Paternal Grandmother   . Diabetes Paternal Grandfather     Social History Social History  Substance Use Topics  . Smoking status: Former Smoker    Packs/day: 1.00    Years: 6.00    Types: Cigarettes    Quit date: 01/22/1974  . Smokeless tobacco: Never Used  . Alcohol use No     Allergies   Invokana [canagliflozin]   Review of Systems Review of Systems  Constitutional: Positive for fatigue. Negative for fever.  HENT: Negative for congestion.   Respiratory: Positive for shortness of breath.   Cardiovascular: Negative for chest pain.  Gastrointestinal: Negative for abdominal pain.  Genitourinary: Negative for dysuria.  Musculoskeletal: Negative for myalgias.  Skin: Negative for rash.  Neurological: Positive for light-headedness. Negative for syncope and headaches.  Hematological: Bruises/bleeds easily.  Psychiatric/Behavioral: Negative for confusion.     Physical Exam Updated Vital Signs BP 110/68   Pulse 87   Resp 21   Ht 5\' 11"  (1.803 m)   Wt 129.3 kg   SpO2 100%   BMI 39.75 kg/m   Physical Exam  Constitutional: He is oriented to person, place, and time. He appears  well-developed and well-nourished. No distress.  HENT:  Head: Normocephalic and atraumatic.  Eyes: EOM are normal. Pupils are equal, round, and reactive to light.  Sclera pale.  Neck: Normal range of motion. Neck supple.  Cardiovascular: Normal rate and regular rhythm.   Pulmonary/Chest: Effort normal and breath sounds normal. No respiratory distress. He has no wheezes. He has no rales.  Abdominal: Soft. Bowel sounds are normal. There is no tenderness.  Musculoskeletal: Normal range of motion. He exhibits edema.  Neurological: He is alert and oriented to person, place, and time. No cranial nerve deficit. He exhibits normal muscle tone. Coordination normal.  Skin: Skin is warm.  Nursing note and vitals reviewed.    ED Treatments / Results  Labs (all labs ordered are listed, but only abnormal results are displayed) Labs Reviewed  CBC WITH DIFFERENTIAL/PLATELET - Abnormal; Notable for the following:       Result Value   RBC 3.32 (*)    Hemoglobin 7.4 (*)    HCT 25.8 (*)    MCV 77.7 (*)    MCH 22.3 (*)    MCHC 28.7 (*)    RDW 18.6 (*)    Neutro Abs 7.9 (*)    All other components within normal limits  BASIC METABOLIC PANEL - Abnormal; Notable for the following:    Sodium 133 (*)    Glucose, Bld 130 (*)    BUN 31 (*)    Creatinine, Ser 1.55 (*)    Calcium 8.4 (*)    GFR calc non Af Amer 45 (*)    GFR calc Af Amer 52 (*)    All other components within normal limits  BRAIN NATRIURETIC PEPTIDE - Abnormal; Notable for the following:    B Natriuretic Peptide 1,471.0 (*)    All other components within normal limits  TYPE AND SCREEN  PREPARE RBC (CROSSMATCH)    EKG  EKG Interpretation  Date/Time:  Saturday April 20 2016 18:00:18 EDT Ventricular Rate:  83 PR Interval:    QRS Duration: 109 QT Interval:  425 QTC Calculation: 500 R Axis:   47 Text Interpretation:  Sinus rhythm Multiple premature complexes, vent & supraven Borderline T abnormalities, anterior leads  Borderline prolonged QT interval No significant change since last tracing Confirmed by Sharonda Llamas  MD, Alesi Zachery ([id number]) on 04/20/2016 6:08:30 PM  Radiology Dg Chest Portable 1 View  Result Date: 04/20/2016 CLINICAL DATA:  Shortness of breath history of endocarditis EXAM: PORTABLE CHEST 1 VIEW COMPARISON:  02/23/2016 FINDINGS: Median sternotomy wires are again evident. Left CP angle is not included. Stable enlarged cardiomediastinal silhouette, allowing for lordotic positioning. Mild central vascular congestion. No consolidation or large effusion. No pneumothorax. Linear opacities at the thoracic inlet are unchanged. IMPRESSION: 1. Stable degree of cardiomegaly, there is mild central vascular congestion. 2. No large effusion or acute focal infiltrate. Electronically Signed   By: Jasmine Pang M.D.   On: 04/20/2016 18:29    Procedures Procedures (including critical care time)  Medications Ordered in ED Medications  0.9 %  sodium chloride infusion (not administered)  0.9 %  sodium chloride infusion (not administered)     Initial Impression / Assessment and Plan / ED Course  I have reviewed the triage vital signs and the nursing notes.  Pertinent labs & imaging results that were available during my care of the patient were reviewed by me and considered in my medical decision making (see chart for details).  Clinical Course    Patient's complaint and labs consistent with symptomatic anemia. Patient's had unknown cause of anemia for a period of time. Hemoglobins are usually in the eights. Now it's down 7.4. Patient's had exertional shortness of breath and lightheadedness. Chest x-ray negative for pulmonary edema. Patient has sniffing cardiac history to include mitral valve and aortic valve replacement secondary to endocarditis in May 2017. Patient is on Lasix and blood thinners. Patient's labs here today besides hemoglobin 7.4 shown elevation BUN and creatinine from baseline. This may be due  to too much Lasix. Patient will require 2 unit blood transfusion. Patient will be admitted to telemetry for the transfusions. Discussed with hospitalist.  Final Clinical Impressions(s) / ED Diagnoses   Final diagnoses:  Anemia, unspecified type  Shortness of breath    New Prescriptions New Prescriptions   No medications on file     Vanetta Mulders, MD 04/20/16 1926

## 2016-04-20 NOTE — H&P (Signed)
History and Physical    [name] [name] DOB: [birth year] DOA: 04/20/2016  PCP: Deloria Lair, MD   Patient coming from: Home.  Chief Complaint: Shortness of breath.  HPI: [name] [name] is a 65 y.o. 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  Per patient, early this month he was told by his physician that his hemoglobin was low. He states that he is a myoglobin level is usually above 8 g/dL, but he was not having significant symptoms earlier this month. Now, for the past few days the patient has been having progressively worse dyspnea associated with lower extremity edema, an episodes of PND, occasional orthopnea and positional lightheadedness, but denies chest pain, palpitations, diaphoresis, fever, chills, but feels very fatigued. He has received blood transfusions in the past and has had bidirectional endoscopic studies without evidence of bleeding.  ED Course: The patient received supplemental oxygen and a transfusion of 2 units of packed RBC was ordered. His hemoglobin was 7.4 g/dL. BNP was 1471 pg/mL, His sodium was 133 and potassium 3.6 mmol/L. His glucose was 130, BUN was 31 and creatinine 1.55 mg/dL. A chest radiograph show cardiomegaly and vascular congestion.  Review of Systems: As per HPI otherwise 10 point review of systems negative.    Past Medical History:  Diagnosis Date  . Anemia   . Aortic insufficiency 11/2010   severe aortic  insufficiency secondary to endocarditis  . Arthritis   . CHF (congestive heart failure) ([place])   . Coronary artery disease (CAD) excluded 2008, 2011   Mild coronary plaque.  w/ normal LV function. repeated cardiac cath on 07/2009  . Deep venous thrombosis (HCC)    recurrent DVT and PE, status post IVC filter  . Depression   . Diabetes mellitus   . Dyspnea    with exertion  . Endocarditis   . GERD (gastroesophageal reflux disease)   . Heart murmur   . History of endocarditis    status post Enterococcus faecalis, secondary to chronic injections with low molecular weight heparin  . Hyperlipidemia   . Hypertension   . 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 Obesity   . OSA (obstructive sleep apnea)    CPAP  . 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 Restless leg syndrome     Past Surgical History:  Procedure Laterality Date  . AORTIC VALVE REPLACEMENT  04/10/2011   [facility]  . CARDIAC CATHETERIZATION  2008  . CARDIAC CATHETERIZATION  07/2009   minor irregulreties without obstructive disease  . COLONOSCOPY W/ POLYPECTOMY    . ESOPHAGOGASTRODUODENOSCOPY ENDOSCOPY    . IVC FILTER PLACEMENT ([facility] HX)    . MITRAL VALVE REPAIR  04/10/2011   autologous pericardial patch augmentation of posterior leaflet with 239mSorin Memo 3D ring annuloplasty  . TEE WITHOUT CARDIOVERSION N/A 11/06/2015   Procedure: TRANSESOPHAGEAL ECHOCARDIOGRAM (TEE);  Surgeon: TiSkeet LatchMD;  [place] Service: Cardiovascular;  Laterality: N/A;  . TEE WITHOUT CARDIOVERSION N/A 02/23/2016  Procedure: TRANSESOPHAGEAL ECHOCARDIOGRAM (TEE);  Surgeon: Fay Records, MD;  [facility];  Service: Cardiovascular;  Laterality: N/A;  . THYROIDECTOMY    . TOTAL HIP ARTHROPLASTY Left 08/10/2015   Procedure: LEFT TOTAL HIP ARTHROPLASTY ANTERIOR APPROACH;  Surgeon: Mcarthur Rossetti,  MD;  Location: [place];  Service: Orthopedics;  Laterality: Left;     reports that he quit smoking about 42 years ago. His smoking use included Cigarettes. He has a 6.00 pack-year smoking history. He has never used smokeless tobacco. He reports that he does not drink alcohol or use drugs.  Allergies  Allergen Reactions  . Invokana [Canagliflozin] Rash    Family History  Problem Relation Age of Onset  . Hypertension Father   . Hyperlipidemia Father   . CAD Father   . Diabetes Mother   . Hypertension Mother   . Cancer Mother   . CAD Mother   . Coronary artery disease Mother   . Stroke Mother   . Kidney disease Mother   . Hyperlipidemia Mother   . Diabetes Maternal Grandmother   . Diabetes Maternal Grandfather   . Diabetes Paternal Grandmother   . Diabetes Paternal Grandfather     Prior to Admission medications   Medication Sig Start Date End Date Taking? Authorizing Provider  aspirin (ECOTRIN) 325 MG EC tablet Take 325 mg by mouth every evening.    Yes Historical Provider, MD  Cholecalciferol (VITAMIN D3) 5000 units CAPS Take 1 capsule by mouth daily.   Yes Historical Provider, MD  diazepam (VALIUM) 5 MG tablet Take 5 mg by mouth at bedtime.  06/01/14  Yes Historical Provider, MD  Ferrous Sulfate-C-Folic Acid 470-962-8.3 MG TBCR Take 500 mg by mouth daily.   Yes Historical Provider, MD  FLUoxetine (PROZAC) 40 MG capsule Take 40 mg by mouth daily.   Yes Historical Provider, MD  HYDROcodone-acetaminophen (NORCO/VICODIN) 5-325 MG tablet take 1 tablet by mouth every 6 hours if needed for SEVERE PAIN 09/20/15  Yes Historical Provider, MD  levothyroxine (SYNTHROID, LEVOTHROID) 112 MCG tablet Take 112 mcg by mouth daily before breakfast.  10/26/15  Yes Historical Provider, MD  lisinopril (PRINIVIL,ZESTRIL) 10 MG tablet Take 10 mg by mouth at bedtime.    Yes Historical Provider, MD  metFORMIN (GLUCOPHAGE-XR) 500 MG 24 hr tablet Take 500 mg by mouth at bedtime.  10/22/15  Yes Historical  Provider, MD  metoprolol tartrate (LOPRESSOR) 25 MG tablet take 1 tablet by mouth once daily Patient taking differently: take 1 tablet by mouth once daily in the evening 12/25/15  Yes Josue Hector, MD  omeprazole (PRILOSEC) 20 MG capsule Take 20 mg by mouth at bedtime.    Yes Historical Provider, MD  rivaroxaban (XARELTO) 20 MG TABS tablet Take 1 tablet (20 mg total) by mouth daily with supper. 03/27/16  Yes Josue Hector, MD  simvastatin (ZOCOR) 40 MG tablet take 1 tablet by mouth at bedtime 01/25/16  Yes Josue Hector, MD  torsemide (DEMADEX) 20 MG tablet Take 20 mg by mouth daily. 04/04/16  Yes Historical Provider, MD  cefTRIAXone 2 g in dextrose 5 % 50 mL Inject 2 g into the vein daily. Patient not taking: Reported on 04/20/2016 02/27/16   Donne Hazel, MD  furosemide (LASIX) 20 MG tablet Take 1 tablet (20 mg total) by mouth daily. Patient not taking: Reported on 04/20/2016 02/26/16   Donne Hazel, MD    Physical Exam:  Constitutional: NAD, calm, comfortable Vitals:   04/20/16 1900 04/20/16  [place] 04/20/16 1915 04/20/16 2000  BP: 94/83 110/68  99/68  Pulse: 83 87 80 82  Resp: 23 21 24 16   SpO2: 94% 100% 95% 100%  Weight:      Height:       Eyes: PERRL, lids and conjunctivae are pale. ENMT: Mucous membranes are moist. Posterior pharynx clear of any exudate or lesions. Dentition is in poor state of repair. Neck: normal, supple, no masses, no thyromegaly Respiratory: Bibasilar rales, no wheezing, no crackles. Normal respiratory effort. No accessory muscle use.  Cardiovascular: Regular rate and rhythm, no murmurs / rubs / gallops. 2+ lower extremity edema. 2+ pedal pulses. No carotid bruits.  Abdomen: Soft, no tenderness, no masses palpated. No hepatosplenomegaly. Bowel sounds positive.  Musculoskeletal: no clubbing / cyanosis. Good ROM, no contractures. Normal muscle tone.  Skin: no rashes, lesions, ulcers. No induration Neurologic: CN 2-12 grossly intact. Sensation intact, DTR normal.  Generalized weakness, but no focalities.  Psychiatric: Normal judgment and insight. Alert and oriented x 4. Normal mood.    Labs on Admission: I have personally reviewed following labs and imaging studies  CBC:  Recent Labs Lab 04/20/16 1800  WBC 9.4  NEUTROABS 7.9*  HGB 7.4*  HCT 25.8*  MCV 77.7*  PLT 660   Basic Metabolic Panel:  Recent Labs Lab 04/20/16 1800  NA 133*  K 3.6  CL 101  CO2 25  GLUCOSE 130*  BUN 31*  CREATININE 1.55*  CALCIUM 8.4*  MG 1.9   GFR: Estimated Creatinine Clearance: 64.3 mL/min (by C-G formula based on SCr of 1.55 mg/dL (H)). Liver Function Tests: No results for input(s): AST, ALT, ALKPHOS, BILITOT, PROT, ALBUMIN in the last 168 hours. No results for input(s): LIPASE, AMYLASE in the last 168 hours. No results for input(s): AMMONIA in the last 168 hours. Coagulation Profile: No results for input(s): INR, PROTIME in the last 168 hours. Cardiac Enzymes: No results for input(s): CKTOTAL, CKMB, CKMBINDEX, TROPONINI in the last 168 hours. BNP (last 3 results) No results for input(s): PROBNP in the last 8760 hours. HbA1C: No results for input(s): HGBA1C in the last 72 hours. CBG: No results for input(s): GLUCAP in the last 168 hours. Lipid Profile: No results for input(s): CHOL, HDL, LDLCALC, TRIG, CHOLHDL, LDLDIRECT in the last 72 hours. Thyroid Function Tests: No results for input(s): TSH, T4TOTAL, FREET4, T3FREE, THYROIDAB in the last 72 hours. Anemia Panel: No results for input(s): VITAMINB12, FOLATE, FERRITIN, TIBC, IRON, RETICCTPCT in the last 72 hours. Urine analysis:    Component Value Date/Time   COLORURINE AMBER (A) 02/21/2016 2145   APPEARANCEUR CLEAR 02/21/2016 2145   LABSPEC >1.030 (H) 02/21/2016 2145   PHURINE 5.5 02/21/2016 2145   GLUCOSEU NEGATIVE 02/21/2016 2145   HGBUR LARGE (A) 02/21/2016 2145   BILIRUBINUR NEGATIVE 02/21/2016 2145   Ronald NEGATIVE 02/21/2016 2145   PROTEINUR 100 (A) 02/21/2016 2145    UROBILINOGEN 0.2 04/09/2011 1746   NITRITE NEGATIVE 02/21/2016 2145   LEUKOCYTESUR NEGATIVE 02/21/2016 2145    Radiological Exams on Admission: Dg Chest Portable 1 View  Result Date: 04/20/2016 CLINICAL DATA:  Shortness of breath history of endocarditis EXAM: PORTABLE CHEST 1 VIEW COMPARISON:  02/23/2016 FINDINGS: Median sternotomy wires are again evident. Left CP angle is not included. Stable enlarged cardiomediastinal silhouette, allowing for lordotic positioning. Mild central vascular congestion. No consolidation or large effusion. No pneumothorax. Linear opacities at the thoracic inlet are unchanged. IMPRESSION: 1. Stable degree of cardiomegaly, there is mild central vascular congestion. 2. No large effusion  or acute focal infiltrate. Electronically Signed   By: Donavan Foil M.D.   On: 04/20/2016 18:29  Echocardiogram 02/22/2016  ------------------------------------------------------------------- LV EF: 50% -   55%  ------------------------------------------------------------------- Indications:      Dyspnea 786.09.  ------------------------------------------------------------------- History:   PMH:   Coronary artery disease.  Congestive heart failure.  PMH:  Obesity. OSA.  Risk factors:  Hypertension. Diabetes mellitus. Dyslipidemia.  ------------------------------------------------------------------- Study Conclusions  - Left ventricle: Abnormal septal motion Wall thickness was   increased in a pattern of moderate LVH. Systolic function was   normal. The estimated ejection fraction was in the range of 50%   to 55%. The study is not technically sufficient to allow   evaluation of LV diastolic function. - Aortic valve: Tissue AVR with calcified right cusp. Gradients   more elevated than before ? due to anemia   No AR or perivalvualr leak no vegetations. Valve area (VTI): 1.7   cm^2. Valve area (Vmax): 1.28 cm^2. Valve area (Vmean): 1.54   cm^2. - Mitral valve: Post repair  with annuloplasty ring trivial residual   MR no vegetation - Left atrium: The atrium was moderately dilated. - Atrial septum: No defect or patent foramen ovale was identified. - Pulmonary arteries: PA peak pressure: 87 mm Hg (S). - Impressions: Due to strong clinical suspicion with ESR 70 anemia   fever and chills patient   scheduled to have TEE with Dr Harrington Challenger tomorrow to r/o SBE   recurrence.  Impressions:  - Due to strong clinical suspicion with ESR 70 anemia fever and   chills patient   scheduled to have TEE with Dr Harrington Challenger tomorrow to r/o SBE   recurrence. There was no evidence of a vegetation.  TEE 02/23/2016 ------------------------------------------------------------------- Indications:      Fever 780.6.  ------------------------------------------------------------------- History:   PMH:   Dyspnea.  Risk factors:  Hypertension. Diabetes mellitus. Dyslipidemia.  ------------------------------------------------------------------- Study Conclusions  - Aortic valve: Aortic valve prosthesis is heavily thickened,   calcified (especially noncoronary cusp) with restricted motion.   There is no discrete mobile vegetation seen; cannot, however,   exclude colonization. Mild AI. Will work at retrieving images   from May 2017 TEE. - Mitral valve: s/p MV repair. Valve is thickened. There is no   discrete vegetation. Mild MR. - Left atrium: No evidence of thrombus in the atrial cavity or   appendage.  EKG: Independently reviewed. Vent. rate 83 BPM PR interval * ms QRS duration 109 ms QT/QTc 425/500 ms P-R-T axes 52 47 45 Sinus rhythm Multiple premature complexes, vent & supraven Borderline T abnormalities, anterior leads Borderline prolonged QT interval No relevant change since previous ECG.  Assessment/Plan Principal Problem:   Symptomatic anemia Admit to telemetry/observation. Continue packed RBC transfusion. Follow-up hematocrit and hemoglobin in a.m. The patient  may benefit from a scheduled outpatient H/H checks and transfusion if needed.  Active Problems:   Acute diastolic CHF (congestive heart failure) (HCC) Continue supplemental oxygen. Transfuse packed RBCs. Furosemide 40 mg IVP 1 dose. No potassium supplementation tonight due to transfusion Strict intake and output. Monitor daily weights. Follow-up BUN, creatinine and electrolytes in a.m.    Depression Continue fluoxetine 40 mg by mouth daily.    Essential hypertension Hold metoprolol tonight since the patient is having acute CHF symptoms. Furosemide 40 mg IVP was given tonight. Continue lisinopril 10 mg by mouth daily. Monitor blood pressure closely.    History of endocarditis No signs of endocarditis at this time. Monitor clinically.    Type 2  diabetes mellitus with vascular disease ([place]) Carbohydrate modified diet. Continue metformin 500 mg by mouth twice a day. Monitor CBG before meals/HS and initiate regular insulin sliding scale if needed.    Primary hypothyroidism Continue levothyroxine 112 g by mouth daily.      Hyperlipidemia Continue simvastatin 40 mg by mouth daily. Monitor fasting lipid panel and LFTs.    DVT prophylaxis: On Xarelto. Code Status: Full code. Family Communication: His wife and sister were present in the room. Disposition Plan: Admit for blood transfusion and diuresis overnight. Consults called:  Admission status: Observation/telemetry.   Reubin Milan MD [facility] Pager [phone].  If 7PM-7AM, please contact night-coverage [email or web address] Password [email or web address]  04/20/2016, 8:30 PM

## 2016-04-20 NOTE — ED Triage Notes (Addendum)
Patient c/o shortness of breath. Per patient anemic and just had blood drawn at doctors office, hbg 7.2. Per patient has had endoscopy and colonoscopy with no bleeding found. Patient also states that he has recently been treated for endocarditis and just had pic line for IV antibiotics removed. Denies any chest pain or fevers. Per patient nausea with dry heave occasionally but no coughing. Per patient generalized weakness.

## 2016-04-21 DIAGNOSIS — D649 Anemia, unspecified: Secondary | ICD-10-CM

## 2016-04-21 DIAGNOSIS — I5031 Acute diastolic (congestive) heart failure: Secondary | ICD-10-CM

## 2016-04-21 DIAGNOSIS — I10 Essential (primary) hypertension: Secondary | ICD-10-CM

## 2016-04-21 LAB — COMPREHENSIVE METABOLIC PANEL
ALT: 217 U/L — ABNORMAL HIGH (ref 17–63)
ANION GAP: 8 (ref 5–15)
AST: 225 U/L — AB (ref 15–41)
Albumin: 2.6 g/dL — ABNORMAL LOW (ref 3.5–5.0)
Alkaline Phosphatase: 191 U/L — ABNORMAL HIGH (ref 38–126)
BILIRUBIN TOTAL: 3.4 mg/dL — AB (ref 0.3–1.2)
BUN: 32 mg/dL — AB (ref 6–20)
CHLORIDE: 103 mmol/L (ref 101–111)
CO2: 23 mmol/L (ref 22–32)
Calcium: 8 mg/dL — ABNORMAL LOW (ref 8.9–10.3)
Creatinine, Ser: 1.38 mg/dL — ABNORMAL HIGH (ref 0.61–1.24)
GFR, EST AFRICAN AMERICAN: 60 mL/min — AB (ref >60)
GFR, EST NON AFRICAN AMERICAN: 52 mL/min — AB (ref >60)
Glucose, Bld: 133 mg/dL — ABNORMAL HIGH (ref 65–99)
POTASSIUM: 3.4 mmol/L — AB (ref 3.5–5.1)
Sodium: 134 mmol/L — ABNORMAL LOW (ref 135–145)
TOTAL PROTEIN: 7 g/dL (ref 6.5–8.1)

## 2016-04-21 LAB — GLUCOSE, CAPILLARY
GLUCOSE-CAPILLARY: 124 mg/dL — AB (ref 65–99)
Glucose-Capillary: 167 mg/dL — ABNORMAL HIGH (ref 65–99)

## 2016-04-21 LAB — CBC
HEMATOCRIT: 26.4 % — AB (ref 39.0–52.0)
Hemoglobin: 7.9 g/dL — ABNORMAL LOW (ref 13.0–17.0)
MCH: 23.2 pg — ABNORMAL LOW (ref 26.0–34.0)
MCHC: 29.9 g/dL — AB (ref 30.0–36.0)
MCV: 77.6 fL — AB (ref 78.0–100.0)
PLATELETS: 202 10*3/uL (ref 150–400)
RBC: 3.4 MIL/uL — ABNORMAL LOW (ref 4.22–5.81)
RDW: 18.4 % — AB (ref 11.5–15.5)
WBC: 10.4 10*3/uL (ref 4.0–10.5)

## 2016-04-21 LAB — MAGNESIUM: MAGNESIUM: 1.7 mg/dL (ref 1.7–2.4)

## 2016-04-21 NOTE — Progress Notes (Signed)
Reviewed discharge instructions with client, instructed on all medications including next dose due. Client voiced understanding, client discharged to home with wife in stable condition.

## 2016-04-21 NOTE — Discharge Summary (Signed)
Physician Discharge Summary  [name] [name] RUE:[id number] DOB: 09/02/1950 DOA: 04/20/2016  PCP: Louie BostonAPPER,DAVID B, MD  Admit date: 04/20/2016 Discharge date: 04/21/2016  Admitted From: Home Disposition:  Home  Recommendations for Outpatient Follow-up:  1. Follow up with PCP in 1-2 weeks  Home Health: No Equipment/Devices: None  Discharge Condition: Stable CODE STATUS: Full Diet recommendation: Carb modified   Brief/Interim Summary: 65 year-old male with a hx of chronic anemia, HTN, depression, endocarditis, DM type 2, HLD, hypothyroidism, and obstructive sleep apnea presented to the ED on 10/28 with shortness of breath. While in the ED, Hgb 7.4, BNP was 1471, Sodium 133, potassium 130, BUN 31, Cr 1.55, and glucose 130. CXR showed cardiomegaly and vascular congestion. He was admitted for treatment of acute CHF and low hemoglobin. He was immediately transfused with 2 units of PRBC at which his Hgb rose to 7.9 which is his baseline. He was also given lasix and appears to be feeling much better now. He is in stable condition and will be discharged home later today. He will follow up with his PCP and his cardiologist as scheduled.  He will remain on his home meds with no changes.    Discharge Diagnoses:  Principal Problem:   Symptomatic anemia Active Problems:   Depression   Essential hypertension   History of endocarditis   Type 2 diabetes mellitus with vascular disease (HCC)   Primary hypothyroidism   Hyperlipidemia   Acute diastolic CHF (congestive heart failure) [facility](HCC)    Discharge Instructions  Discharge Instructions    Diet - low sodium heart healthy    Complete by:  As directed    Discharge instructions    Complete by:  As directed    Follow up with your PCP next week.   Increase activity slowly    Complete by:  As directed        Medication List    STOP taking these medications   cefTRIAXone 2 g in dextrose 5 % 50 mL     TAKE these medications   diazepam 5 MG  tablet Commonly known as:  VALIUM Take 5 mg by mouth at bedtime.   ECOTRIN 325 MG EC tablet Generic drug:  aspirin Take 325 mg by mouth every evening.   Ferrous Sulfate-C-Folic Acid 105-500-0.8 MG Tbcr Take 500 mg by mouth daily.   FLUoxetine 40 MG capsule Commonly known as:  PROZAC Take 40 mg by mouth daily.   furosemide 20 MG tablet Commonly known as:  LASIX Take 1 tablet (20 mg total) by mouth daily.   HYDROcodone-acetaminophen 5-325 MG tablet Commonly known as:  NORCO/VICODIN take 1 tablet by mouth every 6 hours if needed for SEVERE PAIN   levothyroxine 112 MCG tablet Commonly known as:  SYNTHROID, LEVOTHROID Take 112 mcg by mouth daily before breakfast.   lisinopril 10 MG tablet Commonly known as:  PRINIVIL,ZESTRIL Take 10 mg by mouth at bedtime.   metFORMIN 500 MG 24 hr tablet Commonly known as:  GLUCOPHAGE-XR Take 500 mg by mouth at bedtime.   metoprolol tartrate 25 MG tablet Commonly known as:  LOPRESSOR take 1 tablet by mouth once daily What changed:  See the new instructions.   omeprazole 20 MG capsule Commonly known as:  PRILOSEC Take 20 mg by mouth at bedtime.   rivaroxaban 20 MG Tabs tablet Commonly known as:  XARELTO Take 1 tablet (20 mg total) by mouth daily with supper.   simvastatin 40 MG tablet Commonly known as:  ZOCOR take 1 tablet  by mouth at bedtime   torsemide 20 MG tablet Commonly known as:  DEMADEX Take 20 mg by mouth daily.   Vitamin D3 5000 units Caps Take 1 capsule by mouth daily.       Allergies  Allergen Reactions  . Invokana [Canagliflozin] Rash     Procedures/Studies: Dg Chest Portable 1 View  Result Date: 04/20/2016 CLINICAL DATA:  Shortness of breath history of endocarditis EXAM: PORTABLE CHEST 1 VIEW COMPARISON:  02/23/2016 FINDINGS: Median sternotomy wires are again evident. Left CP angle is not included. Stable enlarged cardiomediastinal silhouette, allowing for lordotic positioning. Mild central vascular  congestion. No consolidation or large effusion. No pneumothorax. Linear opacities at the thoracic inlet are unchanged. IMPRESSION: 1. Stable degree of cardiomegaly, there is mild central vascular congestion. 2. No large effusion or acute focal infiltrate. Electronically Signed   By: Jasmine PangKim  Fujinaga M.D.   On: 04/20/2016 18:29    (Echo, Carotid, EGD, Colonoscopy, ERCP)    Subjective:  Feel much better.    Discharge Exam: Vitals:   04/21/16 0549 04/21/16 0630  BP: 111/77 102/67  Pulse: 83 84  Resp: 20 20  Temp: 98.1 F (36.7 C) 98.1 F (36.7 C)   Vitals:   04/21/16 0345 04/21/16 0400 04/21/16 0549 04/21/16 0630  BP: 96/62 (!) 89/61 111/77 102/67  Pulse: 80 82 83 84  Resp: 20 20 20 20   Temp: 98.5 F (36.9 C) 97 F (36.1 C) 98.1 F (36.7 C) 98.1 F (36.7 C)  TempSrc: Oral Oral Oral Oral  SpO2: 100% 98% 98% 99%  Weight:   129.8 kg (286 lb 2.5 oz)   Height:        General: Pt is alert, awake, not in acute distress Cardiovascular: RRR, S1/S2 +, no rubs, no gallops Respiratory: CTA bilaterally, no wheezing, no rhonchi Abdominal: Soft, NT, ND, bowel sounds + Extremities: no edema, no cyanosis    The results of significant diagnostics from this hospitalization (including imaging, microbiology, ancillary and laboratory) are listed below for reference.     Labs: BNP (last 3 results)  Recent Labs  11/03/15 1722 02/21/16 1958 04/20/16 1800  BNP 398.0* 443.0* 1,471.0*   Basic Metabolic Panel:  Recent Labs Lab 04/20/16 1800 04/21/16 0839  NA 133* 134*  K 3.6 3.4*  CL 101 103  CO2 25 23  GLUCOSE 130* 133*  BUN 31* 32*  CREATININE 1.55* 1.38*  CALCIUM 8.4* 8.0*  MG 1.9 1.7   Liver Function Tests:  Recent Labs Lab 04/21/16 0839  AST 225*  ALT 217*  ALKPHOS 191*  BILITOT 3.4*  PROT 7.0  ALBUMIN 2.6*   CBC:  Recent Labs Lab 04/20/16 1800 04/21/16 0839  WBC 9.4 10.4  NEUTROABS 7.9*  --   HGB 7.4* 7.9*  HCT 25.8* 26.4*  MCV 77.7* 77.6*  PLT 232  202   CBG:  Recent Labs Lab 04/20/16 2043 04/21/16 0737  GLUCAP 135* 124*   Urinalysis    Component Value Date/Time   COLORURINE AMBER (A) 02/21/2016 2145   APPEARANCEUR CLEAR 02/21/2016 2145   LABSPEC >1.030 (H) 02/21/2016 2145   PHURINE 5.5 02/21/2016 2145   GLUCOSEU NEGATIVE 02/21/2016 2145   HGBUR LARGE (A) 02/21/2016 2145   BILIRUBINUR NEGATIVE 02/21/2016 2145   KETONESUR NEGATIVE 02/21/2016 2145   PROTEINUR 100 (A) 02/21/2016 2145   UROBILINOGEN 0.2 04/09/2011 1746   NITRITE NEGATIVE 02/21/2016 2145   LEUKOCYTESUR NEGATIVE 02/21/2016 2145   Time coordinating discharge: Over 30 minutes  SIGNED:  Houston SirenPeter Dahiana Kulak, MD  FACP [facility] 04/21/2016, 10:47 AM If 7PM-7AM, please contact night-coverage [email or web address] Password [email or web address]  By signing my name below, I, [name] [name], attest that this documentation has been prepared under the direction and in the presence of Houston Siren, MD. Electronically signed: Bobbie [name], Scribe.  04/21/16

## 2016-04-22 LAB — TYPE AND SCREEN
ABO/RH(D): A POS
ANTIBODY SCREEN: NEGATIVE
Unit division: 0
Unit division: 0

## 2016-04-29 ENCOUNTER — Encounter: Payer: Self-pay | Admitting: Cardiology

## 2016-04-29 NOTE — Progress Notes (Signed)
Cardiology Office Note  Date: 05/01/2016   ID: [name] PearlSteven C Narayan, DOB 07/08/1950, MRN [id number]  PCP: Louie BostonAPPER,DAVID B, MD  Primary Cardiologist: Charlton HawsPeter Nishan, MD  Chief Complaint  Patient presents with  . Shortness of Breath  . Leg Swelling    History of Present Illness: [name] [name] is a medically complex 65 y.o. male patient of Dr. Eden EmmsNishan, last seen by Mr. Lenn CalKilroy PA-C in June. Interval records reviewed. He presents today with his wife, has decided to follow-up with me going forward, recommended by Dr. Margo Commonapper. He tells me that since September he has been experiencing progressive fatigue and shortness of breath with activity, also significant leg swelling and weight gain. He reports compliance with his medications, has been on relatively low-dose diuretic. He does not report any palpitations or chest pain.  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 Transthoracic echocardiogram from August reported LVEF 50-55%, tissue AVR with elevated mean gradient of 33 mmHg, mitral repair with trivial mitral regurgitation, also severe pulmonary hypertension with PASP 87 mmHg, reportedly normal RV contraction. Subsequent TEE in September did not report any vegetations.  He has already been referred to hematology for evaluation of chronic anemia with negative GI workup/endoscopy. He is not reporting any melena or hematochezia. Uncertain whether he has had any workup for hemolysis.  He has obstructive sleep apnea and uses CPAP at night time. States he still gets short of breath and wakes up and has to sit up at night time recently. He does not use oxygen during the daytime.  Past Medical History:  Diagnosis Date  . Anemia   . 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 Arthritis   . CAD (coronary  artery disease) 2008, 2011   Mild coronary atherosclerosis by cardiac catheterization  . Deep venous thrombosis (HCC)    Recurrent DVT and PE, status post IVC filter  . Depression   . Essential hypertension   . GERD (gastroesophageal reflux disease)   . 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 Hyperlipidemia   . 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 Obesity   . OSA (obstructive sleep apnea)    CPAP  . Postphlebitic syndrome    Recurrent DVT  . Restless leg syndrome   . Type 2 diabetes mellitus (HCC)     Past Surgical History:  Procedure Laterality Date  . AORTIC VALVE REPLACEMENT  04/10/2011   25mm [facility] Magna Ease pericardial bioprosthetic aortic valve  . CARDIAC CATHETERIZATION  2008  . CARDIAC CATHETERIZATION  07/2009   Minor irregularities without obstructive disease  . COLONOSCOPY W/ POLYPECTOMY    . ESOPHAGOGASTRODUODENOSCOPY ENDOSCOPY    . IVC FILTER PLACEMENT ([facility] HX)    . MITRAL VALVE REPAIR  04/10/2011   Autologous pericardial patch augmentation of posterior leaflet with 28mm Sorin Memo 3D ring annuloplasty  . TEE WITHOUT CARDIOVERSION N/A 11/06/2015   Procedure: TRANSESOPHAGEAL ECHOCARDIOGRAM (TEE);  Surgeon: Chilton Siiffany Westover, MD;  [facility];  Service: Cardiovascular;  Laterality: N/A;  . TEE WITHOUT CARDIOVERSION N/A 02/23/2016   Procedure: TRANSESOPHAGEAL ECHOCARDIOGRAM (TEE);  Surgeon: Pricilla RifflePaula V Ross, MD;  Location[facility];  Service: Cardiovascular;  Laterality: N/A;  . THYROIDECTOMY    . TOTAL HIP  ARTHROPLASTY Left 08/10/2015   Procedure: LEFT TOTAL HIP ARTHROPLASTY ANTERIOR APPROACH;  Surgeon: Kathryne Hitchhristopher Y Blackman, MD;  [facility];  Service: Orthopedics;  Laterality: Left;    Current Outpatient Prescriptions  Medication Sig Dispense Refill  . aspirin (ECOTRIN) 325 MG EC tablet Take 325 mg by mouth every evening.      . Cholecalciferol (VITAMIN D3) 5000 units CAPS Take 1 capsule by mouth daily.    . diazepam (VALIUM) 5 MG tablet Take 5 mg by mouth at bedtime.   0  . Ferrous Sulfate-C-Folic Acid 105-500-0.8 MG TBCR Take 500 mg by mouth 2 (two) times daily.     Marland Kitchen. FLUoxetine (PROZAC) 40 MG capsule Take 40 mg by mouth daily.    Marland Kitchen. HYDROcodone-acetaminophen (NORCO/VICODIN) 5-325 MG tablet take 1 tablet by mouth every 6 hours if needed for SEVERE PAIN  0  . levothyroxine (SYNTHROID, LEVOTHROID) 200 MCG tablet Take 200 mcg by mouth daily before breakfast.    . lisinopril (PRINIVIL,ZESTRIL) 10 MG tablet Take 10 mg by mouth at bedtime.     . metFORMIN (GLUCOPHAGE-XR) 500 MG 24 hr tablet Take 500 mg by mouth every morning. & 2 in the evening  0  . metoprolol tartrate (LOPRESSOR) 25 MG tablet take 1 tablet by mouth once daily (Patient taking differently: take 1 tablet by mouth once daily in the evening) 90 tablet 3  . omeprazole (PRILOSEC) 20 MG capsule Take 20 mg by mouth at bedtime.     . rivaroxaban (XARELTO) 20 MG TABS tablet Take 1 tablet (20 mg total) by mouth daily with supper. 28 tablet 0  . simvastatin (ZOCOR) 40 MG tablet take 1 tablet by mouth at bedtime 90 tablet 3  . vitamin C (ASCORBIC ACID) 500 MG tablet Take 500 mg by mouth 2 (two) times daily.    . potassium chloride (K-DUR) 10 MEQ tablet Take 1 tablet (10 mEq total) by mouth daily. 90 tablet 3  . torsemide (DEMADEX) 20 MG tablet Take 2 tablets (40 mg total) by mouth daily. 60 tablet 3   No current facility-administered medications for this visit.    Allergies:  Invokana [canagliflozin]   Social History: The patient  reports that he quit smoking about 42 years ago. His smoking use included Cigarettes. He started smoking about 49 years ago. He has a 6.00 pack-year smoking history. He has never used smokeless tobacco. He reports that he does not drink alcohol or use drugs.   Family History: The patient's family history includes CAD in his father and  mother; Cancer in his mother; Coronary artery disease in his mother; Diabetes in his maternal grandfather, maternal grandmother, mother, paternal grandfather, and paternal grandmother; Hyperlipidemia in his father and mother; Hypertension in his father and mother; Kidney disease in his mother; Stroke in his mother.   ROS:  Please see the history of present illness. Otherwise, complete review of systems is positive for significant fatigue, chronic back pain, uses a cane.  All other systems are reviewed and negative.   Physical Exam: VS:  BP 105/71   Pulse 66   Ht 5\' 11"  (1.803 m)   Wt 300 lb (136.1 kg)   SpO2 98% Comment: on room air  BMI 41.84 kg/m , BMI Body mass index is 41.84 kg/m.  Wt Readings from Last 3 Encounters:  05/01/16 300 lb (136.1 kg)  04/21/16 286 lb 2.5 oz (129.8 kg)  02/26/16 266 lb 9.6 oz (120.9 kg)    General: Morbidly obese, chronically ill-appearing pale male,  no active distress. HEENT: Conjunctiva and lids normal, oropharynx clear. Neck: Supple, elevated JVP, no carotid bruits, no thyromegaly. Lungs: Clear to auscultation, nonlabored breathing at rest. Cardiac: Regular rate and rhythm, no S3, 2-3/6 holosystolic basal murmur, no diastolic murmur, no pericardial rub. Abdomen: Obese with pannus, unable to palpate liver edge, bowel sounds present, no guarding or rebound. Extremities: Significant bilateral leg edema, distal pulses 1-2+. Skin: Warm and dry. Musculoskeletal: No kyphosis. Neuropsychiatric: Alert and oriented x3, affect grossly appropriate.  ECG: I personally reviewed the tracing from 04/20/2016 which showed sinus rhythm with PAC, nonspecific T wave changes, and prolonged QTc.  Recent Labwork: 02/22/2016: TSH 1.641 04/20/2016: B Natriuretic Peptide 1,471.0 04/21/2016: ALT 217; AST 225; BUN 32; Creatinine, Ser 1.38; Hemoglobin 7.9; Magnesium 1.7; Platelets 202; Potassium 3.4; Sodium 134   Other Studies Reviewed Today:  TEE 02/23/2016: Study  Conclusions  - Aortic valve: Aortic valve prosthesis is heavily thickened,   calcified (especially noncoronary cusp) with restricted motion.   There is no discrete mobile vegetation seen; cannot, however,   exclude colonization. Mild AI. Will work at retrieving images   from May 2017 TEE. - Mitral valve: s/p MV repair. Valve is thickened. There is no   discrete vegetation Mild MR. - Left atrium: No evidence of thrombus in the atrial cavity or   appendage.  Assessment and Plan:  1. Progressive dyspnea on exertion and fatigue associated with weight gain and volume overload with significant bilateral leg edema. Patient with very complex medical history as outlined above. At this point suspect that he has diastolic heart failure and in particular pulmonary hypertension with right-sided heart failure symptoms based on workup that has been done so far. Plan for now is to increase Demadex to 40 mg daily, add low-dose potassium supplement, follow-up BMET in 2 weeks. He also needs a follow-up transthoracic echocardiogram which will be arranged. Further workup from there.  2. Severe anemia, relatively chronic. Hemoglobin 7.9 in October. Reportedly previous negative GI workup. He has pending consultation with hematology. I am not certain whether hemolysis has been worked up as a possibility with his valvular heart disease.  3. History of bioprosthetic AVR and mitral valve repair with previous endocarditis. Last echocardiogram showed increased aortic valve gradients, more prominent than would be expected with a bioprosthesis.  4. History of recurrent DVT and pulmonary embolus with previous IVC filter and anticoagulation on Xarelto. He has known postphlebitic syndrome as well.  5. Prior documented history of only mild coronary atherosclerosis.  Current medicines were reviewed with the patient today.   Orders Placed This Encounter  Procedures  . Basic Metabolic Panel (BMET)  . ECHOCARDIOGRAM  COMPLETE    Disposition: Follow-up in a few weeks.  Signed, Jonelle Sidle, MD, [facility] 05/01/2016 10:05 AM    [facility] at [facility] [address] [place], [place], [place] [address] Phone: [phone]; Fax: [phone]

## 2016-05-01 ENCOUNTER — Ambulatory Visit (INDEPENDENT_AMBULATORY_CARE_PROVIDER_SITE_OTHER): Payer: Medicare Other | Admitting: Cardiology

## 2016-05-01 ENCOUNTER — Ambulatory Visit (INDEPENDENT_AMBULATORY_CARE_PROVIDER_SITE_OTHER): Payer: Medicare Other

## 2016-05-01 ENCOUNTER — Other Ambulatory Visit: Payer: Self-pay

## 2016-05-01 ENCOUNTER — Telehealth: Payer: Self-pay | Admitting: *Deleted

## 2016-05-01 ENCOUNTER — Encounter: Payer: Self-pay | Admitting: Cardiology

## 2016-05-01 VITALS — BP 105/71 | HR 66 | Ht 71.0 in | Wt 300.0 lb

## 2016-05-01 DIAGNOSIS — Z952 Presence of prosthetic heart valve: Secondary | ICD-10-CM

## 2016-05-01 DIAGNOSIS — I5033 Acute on chronic diastolic (congestive) heart failure: Secondary | ICD-10-CM

## 2016-05-01 DIAGNOSIS — R0609 Other forms of dyspnea: Secondary | ICD-10-CM | POA: Diagnosis not present

## 2016-05-01 DIAGNOSIS — Z9889 Other specified postprocedural states: Secondary | ICD-10-CM

## 2016-05-01 DIAGNOSIS — I272 Pulmonary hypertension, unspecified: Secondary | ICD-10-CM

## 2016-05-01 DIAGNOSIS — D649 Anemia, unspecified: Secondary | ICD-10-CM

## 2016-05-01 LAB — ECHOCARDIOGRAM COMPLETE
Height: 71 in
WEIGHTICAEL: 4800 [oz_av]

## 2016-05-01 MED ORDER — POTASSIUM CHLORIDE ER 10 MEQ PO TBCR: 10.0000 meq | EXTENDED_RELEASE_TABLET | Freq: Every day | ORAL | 3 refills | Status: AC

## 2016-05-01 MED ORDER — TORSEMIDE 20 MG PO TABS: 40.0000 mg | ORAL_TABLET | Freq: Every day | ORAL | 3 refills | Status: AC

## 2016-05-01 NOTE — Patient Instructions (Signed)
Claris [id number] Minors516OrCataract Laser Centercentral LLCDiego Coryrn [phone] [id number] [id number] Grantawer Liberty GlobaleLyndel S18aSouth JacksonvilleenUc Health Yam BADTEXTTAG> DTEXTTAG>AG>XTTAG>u er

## 2016-05-01 NOTE — Telephone Encounter (Signed)
Pt aware - [facility] also made Trish aware  - was told pt needs to wait for phone call tomorrow for direct admit instructions

## 2016-05-01 NOTE — Telephone Encounter (Signed)
-----  Message from Satira Sark, MD sent at 05/01/2016  3:18 PM EST ----- Results reviewed. I am not able to compare these images with the most recent studies on the current imaging system today. My main concern with his recent symptoms is diastolic heart failure with volume overload and also right ventricular dysfunction. Degree of pulmonary hypertension does not measure as high by this study. There also looks to be more mitral regurgitation than described previously and also a concern for potential prosthetic aortic valve stenosis. We did increase his Demadex when I met him today, I think the best course of action would be to arrange hospital admission for IV diuresis and evaluation by the heart failure team. He may need additional studies to include TEE and right heart catheterization to more completely understand the picture. A copy of this test should be forwarded to [facility] B, MD.

## 2016-05-02 ENCOUNTER — Inpatient Hospital Stay (HOSPITAL_COMMUNITY)
Admission: RE | Admit: 2016-05-02 | Discharge: 2016-05-24 | DRG: 291 | Disposition: E | Payer: Medicare Other | Source: Ambulatory Visit | Attending: Interventional Cardiology | Admitting: Interventional Cardiology

## 2016-05-02 DIAGNOSIS — I4891 Unspecified atrial fibrillation: Secondary | ICD-10-CM | POA: Diagnosis present

## 2016-05-02 DIAGNOSIS — K219 Gastro-esophageal reflux disease without esophagitis: Secondary | ICD-10-CM | POA: Diagnosis present

## 2016-05-02 DIAGNOSIS — I471 Supraventricular tachycardia: Secondary | ICD-10-CM | POA: Diagnosis present

## 2016-05-02 DIAGNOSIS — E872 Acidosis: Secondary | ICD-10-CM | POA: Diagnosis present

## 2016-05-02 DIAGNOSIS — N170 Acute kidney failure with tubular necrosis: Secondary | ICD-10-CM | POA: Diagnosis present

## 2016-05-02 DIAGNOSIS — I4901 Ventricular fibrillation: Secondary | ICD-10-CM | POA: Diagnosis present

## 2016-05-02 DIAGNOSIS — Z86718 Personal history of other venous thrombosis and embolism: Secondary | ICD-10-CM | POA: Diagnosis not present

## 2016-05-02 DIAGNOSIS — Z87891 Personal history of nicotine dependence: Secondary | ICD-10-CM

## 2016-05-02 DIAGNOSIS — I5082 Biventricular heart failure: Secondary | ICD-10-CM | POA: Diagnosis present

## 2016-05-02 DIAGNOSIS — G4733 Obstructive sleep apnea (adult) (pediatric): Secondary | ICD-10-CM | POA: Diagnosis present

## 2016-05-02 DIAGNOSIS — Z96642 Presence of left artificial hip joint: Secondary | ICD-10-CM | POA: Diagnosis present

## 2016-05-02 DIAGNOSIS — Z952 Presence of prosthetic heart valve: Secondary | ICD-10-CM | POA: Diagnosis not present

## 2016-05-02 DIAGNOSIS — Z7901 Long term (current) use of anticoagulants: Secondary | ICD-10-CM | POA: Diagnosis not present

## 2016-05-02 DIAGNOSIS — R570 Cardiogenic shock: Secondary | ICD-10-CM | POA: Diagnosis present

## 2016-05-02 DIAGNOSIS — I5033 Acute on chronic diastolic (congestive) heart failure: Secondary | ICD-10-CM | POA: Diagnosis present

## 2016-05-02 DIAGNOSIS — Z953 Presence of xenogenic heart valve: Secondary | ICD-10-CM | POA: Diagnosis present

## 2016-05-02 DIAGNOSIS — D649 Anemia, unspecified: Secondary | ICD-10-CM | POA: Diagnosis present

## 2016-05-02 DIAGNOSIS — I469 Cardiac arrest, cause unspecified: Secondary | ICD-10-CM | POA: Diagnosis not present

## 2016-05-02 DIAGNOSIS — Z9889 Other specified postprocedural states: Secondary | ICD-10-CM

## 2016-05-02 DIAGNOSIS — I10 Essential (primary) hypertension: Secondary | ICD-10-CM | POA: Diagnosis not present

## 2016-05-02 DIAGNOSIS — I083 Combined rheumatic disorders of mitral, aortic and tricuspid valves: Secondary | ICD-10-CM | POA: Diagnosis present

## 2016-05-02 DIAGNOSIS — E1122 Type 2 diabetes mellitus with diabetic chronic kidney disease: Secondary | ICD-10-CM | POA: Diagnosis present

## 2016-05-02 DIAGNOSIS — G2581 Restless legs syndrome: Secondary | ICD-10-CM | POA: Diagnosis present

## 2016-05-02 DIAGNOSIS — E1159 Type 2 diabetes mellitus with other circulatory complications: Secondary | ICD-10-CM | POA: Diagnosis present

## 2016-05-02 DIAGNOSIS — Z86711 Personal history of pulmonary embolism: Secondary | ICD-10-CM

## 2016-05-02 DIAGNOSIS — Z823 Family history of stroke: Secondary | ICD-10-CM

## 2016-05-02 DIAGNOSIS — I2510 Atherosclerotic heart disease of native coronary artery without angina pectoris: Secondary | ICD-10-CM | POA: Diagnosis present

## 2016-05-02 DIAGNOSIS — N183 Chronic kidney disease, stage 3 (moderate): Secondary | ICD-10-CM | POA: Diagnosis present

## 2016-05-02 DIAGNOSIS — Z0389 Encounter for observation for other suspected diseases and conditions ruled out: Secondary | ICD-10-CM

## 2016-05-02 DIAGNOSIS — I130 Hypertensive heart and chronic kidney disease with heart failure and stage 1 through stage 4 chronic kidney disease, or unspecified chronic kidney disease: Principal | ICD-10-CM | POA: Diagnosis present

## 2016-05-02 DIAGNOSIS — J9601 Acute respiratory failure with hypoxia: Secondary | ICD-10-CM | POA: Diagnosis present

## 2016-05-02 DIAGNOSIS — I340 Nonrheumatic mitral (valve) insufficiency: Secondary | ICD-10-CM | POA: Diagnosis present

## 2016-05-02 DIAGNOSIS — Z809 Family history of malignant neoplasm, unspecified: Secondary | ICD-10-CM

## 2016-05-02 DIAGNOSIS — Z841 Family history of disorders of kidney and ureter: Secondary | ICD-10-CM

## 2016-05-02 DIAGNOSIS — I87009 Postthrombotic syndrome without complications of unspecified extremity: Secondary | ICD-10-CM | POA: Diagnosis present

## 2016-05-02 DIAGNOSIS — R0600 Dyspnea, unspecified: Secondary | ICD-10-CM

## 2016-05-02 DIAGNOSIS — R34 Anuria and oliguria: Secondary | ICD-10-CM | POA: Diagnosis present

## 2016-05-02 DIAGNOSIS — E1165 Type 2 diabetes mellitus with hyperglycemia: Secondary | ICD-10-CM | POA: Diagnosis present

## 2016-05-02 DIAGNOSIS — Z888 Allergy status to other drugs, medicaments and biological substances status: Secondary | ICD-10-CM

## 2016-05-02 DIAGNOSIS — Z6841 Body Mass Index (BMI) 40.0 and over, adult: Secondary | ICD-10-CM

## 2016-05-02 DIAGNOSIS — I351 Nonrheumatic aortic (valve) insufficiency: Secondary | ICD-10-CM | POA: Diagnosis present

## 2016-05-02 DIAGNOSIS — Z833 Family history of diabetes mellitus: Secondary | ICD-10-CM

## 2016-05-02 DIAGNOSIS — J9600 Acute respiratory failure, unspecified whether with hypoxia or hypercapnia: Secondary | ICD-10-CM

## 2016-05-02 DIAGNOSIS — E669 Obesity, unspecified: Secondary | ICD-10-CM | POA: Diagnosis present

## 2016-05-02 DIAGNOSIS — I509 Heart failure, unspecified: Secondary | ICD-10-CM | POA: Diagnosis not present

## 2016-05-02 DIAGNOSIS — N179 Acute kidney failure, unspecified: Secondary | ICD-10-CM | POA: Diagnosis not present

## 2016-05-02 DIAGNOSIS — Z8249 Family history of ischemic heart disease and other diseases of the circulatory system: Secondary | ICD-10-CM

## 2016-05-02 DIAGNOSIS — IMO0002 Reserved for concepts with insufficient information to code with codable children: Secondary | ICD-10-CM | POA: Diagnosis present

## 2016-05-02 DIAGNOSIS — Z7984 Long term (current) use of oral hypoglycemic drugs: Secondary | ICD-10-CM

## 2016-05-02 DIAGNOSIS — Z7982 Long term (current) use of aspirin: Secondary | ICD-10-CM

## 2016-05-02 DIAGNOSIS — Z8679 Personal history of other diseases of the circulatory system: Secondary | ICD-10-CM

## 2016-05-02 DIAGNOSIS — Z79899 Other long term (current) drug therapy: Secondary | ICD-10-CM

## 2016-05-02 LAB — GLUCOSE, CAPILLARY: GLUCOSE-CAPILLARY: 125 mg/dL — AB (ref 65–99)

## 2016-05-02 LAB — TSH: TSH: 2.739 u[IU]/mL (ref 0.350–4.500)

## 2016-05-02 LAB — CBC WITH DIFFERENTIAL/PLATELET
Basophils Absolute: 0 10*3/uL (ref 0.0–0.1)
Basophils Relative: 1 %
EOS ABS: 0 10*3/uL (ref 0.0–0.7)
EOS PCT: 1 %
HCT: 27.8 % — ABNORMAL LOW (ref 39.0–52.0)
HEMOGLOBIN: 8.2 g/dL — AB (ref 13.0–17.0)
LYMPHS ABS: 1.2 10*3/uL (ref 0.7–4.0)
Lymphocytes Relative: 18 %
MCH: 22.8 pg — AB (ref 26.0–34.0)
MCHC: 29.5 g/dL — AB (ref 30.0–36.0)
MCV: 77.4 fL — ABNORMAL LOW (ref 78.0–100.0)
MONO ABS: 0.2 10*3/uL (ref 0.1–1.0)
MONOS PCT: 3 %
NEUTROS PCT: 77 %
Neutro Abs: 5.2 10*3/uL (ref 1.7–7.7)
Platelets: 212 10*3/uL (ref 150–400)
RBC: 3.59 MIL/uL — ABNORMAL LOW (ref 4.22–5.81)
RDW: 20 % — AB (ref 11.5–15.5)
WBC: 6.6 10*3/uL (ref 4.0–10.5)

## 2016-05-02 LAB — BASIC METABOLIC PANEL
Anion gap: 8 (ref 5–15)
BUN: 29 mg/dL — AB (ref 6–20)
CHLORIDE: 107 mmol/L (ref 101–111)
CO2: 20 mmol/L — AB (ref 22–32)
CREATININE: 1.4 mg/dL — AB (ref 0.61–1.24)
Calcium: 8.6 mg/dL — ABNORMAL LOW (ref 8.9–10.3)
GFR calc Af Amer: 59 mL/min — ABNORMAL LOW (ref >60)
GFR calc non Af Amer: 51 mL/min — ABNORMAL LOW (ref >60)
GLUCOSE: 147 mg/dL — AB (ref 65–99)
Potassium: 4.2 mmol/L (ref 3.5–5.1)
Sodium: 135 mmol/L (ref 135–145)

## 2016-05-02 LAB — BRAIN NATRIURETIC PEPTIDE: B Natriuretic Peptide: 1524.1 pg/mL — ABNORMAL HIGH (ref 0.0–100.0)

## 2016-05-02 MED ORDER — METOPROLOL TARTRATE 25 MG PO TABS
25.0000 mg | ORAL_TABLET | Freq: Every evening | ORAL | Status: DC
  Administered 2016-05-02: 25 mg via ORAL
  Filled 2016-05-02: qty 1

## 2016-05-02 MED ORDER — FUROSEMIDE 10 MG/ML IJ SOLN
60.0000 mg | Freq: Two times a day (BID) | INTRAMUSCULAR | Status: DC
Start: 2016-05-03 — End: 2016-05-03
  Administered 2016-05-03: 60 mg via INTRAVENOUS
  Filled 2016-05-02: qty 6

## 2016-05-02 MED ORDER — RIVAROXABAN 20 MG PO TABS
20.0000 mg | ORAL_TABLET | Freq: Every day | ORAL | Status: DC
  Administered 2016-05-02 – 2016-05-05 (×4): 20 mg via ORAL
  Filled 2016-05-02 (×4): qty 1

## 2016-05-02 MED ORDER — SODIUM CHLORIDE 0.9% FLUSH: 3.0000 mL | INTRAVENOUS | Status: DC | PRN

## 2016-05-02 MED ORDER — LISINOPRIL 10 MG PO TABS: 10.0000 mg | ORAL_TABLET | Freq: Every day | ORAL | Status: DC

## 2016-05-02 MED ORDER — ACETAMINOPHEN 325 MG PO TABS
650.0000 mg | ORAL_TABLET | ORAL | Status: DC | PRN
  Administered 2016-05-05: 650 mg via ORAL
  Filled 2016-05-02: qty 2

## 2016-05-02 MED ORDER — FUROSEMIDE 10 MG/ML IJ SOLN
40.0000 mg | Freq: Two times a day (BID) | INTRAMUSCULAR | Status: DC
Start: 2016-05-02 — End: 2016-05-02

## 2016-05-02 MED ORDER — SIMVASTATIN 40 MG PO TABS
40.0000 mg | ORAL_TABLET | Freq: Every day | ORAL | Status: DC
  Administered 2016-05-02 – 2016-05-05 (×4): 40 mg via ORAL
  Filled 2016-05-02 (×4): qty 1

## 2016-05-02 MED ORDER — POTASSIUM CHLORIDE CRYS ER 10 MEQ PO TBCR
10.0000 meq | EXTENDED_RELEASE_TABLET | Freq: Every day | ORAL | Status: DC
  Administered 2016-05-03 – 2016-05-05 (×3): 10 meq via ORAL
  Filled 2016-05-02 (×6): qty 1

## 2016-05-02 MED ORDER — VITAMIN D 1000 UNITS PO TABS
5000.0000 [IU] | ORAL_TABLET | Freq: Every day | ORAL | Status: DC
Start: 2016-05-03 — End: 2016-05-06
  Administered 2016-05-03 – 2016-05-06 (×4): 5000 [IU] via ORAL
  Filled 2016-05-02 (×4): qty 5

## 2016-05-02 MED ORDER — SODIUM CHLORIDE 0.9 % IV SOLN: 250.0000 mL | INTRAVENOUS | Status: DC | PRN

## 2016-05-02 MED ORDER — SODIUM CHLORIDE 0.9% FLUSH
3.0000 mL | Freq: Two times a day (BID) | INTRAVENOUS | Status: DC
  Administered 2016-05-02 – 2016-05-06 (×7): 3 mL via INTRAVENOUS

## 2016-05-02 MED ORDER — FLUOXETINE HCL 20 MG PO CAPS
40.0000 mg | ORAL_CAPSULE | Freq: Every day | ORAL | Status: DC
  Administered 2016-05-03 – 2016-05-06 (×4): 40 mg via ORAL
  Filled 2016-05-02 (×4): qty 2

## 2016-05-02 MED ORDER — DIAZEPAM 5 MG PO TABS
5.0000 mg | ORAL_TABLET | Freq: Every day | ORAL | Status: DC
  Administered 2016-05-02 – 2016-05-05 (×4): 5 mg via ORAL
  Filled 2016-05-02 (×4): qty 1

## 2016-05-02 MED ORDER — ONDANSETRON HCL 4 MG/2ML IJ SOLN
4.0000 mg | Freq: Four times a day (QID) | INTRAMUSCULAR | Status: DC | PRN
  Administered 2016-05-05: 4 mg via INTRAVENOUS
  Filled 2016-05-02 (×2): qty 2

## 2016-05-02 MED ORDER — LEVOTHYROXINE SODIUM 100 MCG PO TABS
200.0000 ug | ORAL_TABLET | Freq: Every day | ORAL | Status: DC
  Administered 2016-05-03 – 2016-05-06 (×4): 200 ug via ORAL
  Filled 2016-05-02 (×5): qty 2

## 2016-05-02 MED ORDER — PANTOPRAZOLE SODIUM 40 MG PO TBEC
40.0000 mg | DELAYED_RELEASE_TABLET | Freq: Every day | ORAL | Status: DC
  Administered 2016-05-03 – 2016-05-06 (×4): 40 mg via ORAL
  Filled 2016-05-02 (×4): qty 1

## 2016-05-02 NOTE — Progress Notes (Signed)
Notified card master of pt's arrival to floor and no order.  Instructed will notified assign MD. For orders.  Will continue to monitor.  Amanda PeaNellie Izza Bickle, Charity fundraiserN.

## 2016-05-02 NOTE — Progress Notes (Signed)
Addendum to office note. Echocardiogram reviewed. I have not been able to compare with most recent echocardiographic studies including TEE. Main concern at this point is significant volume overload in what looks to be diastolic heart failure with restrictive filling pattern and also RV dysfunction (new?). Pulmonary hypertension was not as significantly increased by this study but may be underestimated. Also cannot exclude the possibility of aortic valve prosthetic stenosis, and the degree of his mitral regurgitation looks to be more significant compared to prior studies on some images. Recommendation is for hospital admission, IV diuresis, involvement of the heart failure team. I suspect he will need a repeat TEE and also potentially right heart catheterization to better understand the overall situation and management options. He was to have an outpatient hematology consultation regarding his chronic anemia, this can also be addressed during hospital stay. Wonder whether hemolysis has been considered. Rosann Auerbachrish has been informed, patient to present November 9 for elective admission, and this office note with addendum can serve his history and physical.

## 2016-05-02 NOTE — H&P (Signed)
History & Physical    Patient ID: [name] [name] MRN: [id number], DOB/AGE: 11-21-1950   Admit date: 17-May-2016  Primary Physician: Louie Boston, MD Primary Cardiologist: Dr. Diona Browner  History of Present Illness    [name] [name] is a 65 y.o. 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 Diona Browner on 05/01/2016 and admission was recommended.   Last transthoracic echocardiogram from August 01/2016 reported LVEF 50-55%, tissue AVR with elevated mean gradient of 33 mmHg, mitral repair with trivial mitral regurgitation, also severe pulmonary hypertension with PASP 87 mmHg, reportedly normal RV contraction. Subsequent TEE in September did not report any vegetations.  He has already been referred to hematology for evaluation of chronic anemia with negative GI workup/endoscopy. He is not reporting any melena or hematochezia. Uncertain whether he has had any workup for hemolysis.  Admission to the hospital was recommended for his worsening dyspnea, as this was thought to be secondary to worsening right-sided heart failure with restrictive filling pattern and also RV dysfunction, however the patient wished to delay admission until today.   He reports worsening orthopnea, dyspnea, and generalized edema for the past month. Has experienced a weight gain of over 25 pounds, with baseline weight being approximately 275 lbs. Reports good compliance with his home medication regimen but says edema has just continued to worsen. Denies any chest discomfort or palpitations.   Past Medical History    Past Medical History:  Diagnosis Date  . Anemia   . 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  Arthritis   . CAD (coronary artery disease) 2008, 2011   Mild coronary atherosclerosis by cardiac catheterization  . Deep venous thrombosis (HCC)    Recurrent DVT and PE, status post IVC filter  . Depression   . Essential hypertension   . GERD (gastroesophageal reflux disease)   . 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 Hyperlipidemia   . 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 Obesity   . OSA (obstructive sleep apnea)    CPAP  . Postphlebitic syndrome    Recurrent DVT  . Restless leg syndrome   . Type 2 diabetes mellitus (HCC)     Past Surgical History:  Procedure Laterality Date  . AORTIC VALVE REPLACEMENT  04/10/2011   25mm [facility] Ease pericardial bioprosthetic aortic valve  . CARDIAC CATHETERIZATION  2008  . CARDIAC CATHETERIZATION  07/2009   Minor irregularities without obstructive disease  . COLONOSCOPY W/ POLYPECTOMY    . ESOPHAGOGASTRODUODENOSCOPY ENDOSCOPY    . IVC FILTER PLACEMENT ([facility] HX)    . MITRAL VALVE REPAIR  04/10/2011   Autologous pericardial patch augmentation of posterior leaflet with 28mm Sorin Memo 3D ring annuloplasty  . TEE WITHOUT CARDIOVERSION N/A 11/06/2015   Procedure: TRANSESOPHAGEAL ECHOCARDIOGRAM (TEE);  Surgeon: Chilton Si, MD;  Location: [facility] ENDOSCOPY;  Service: Cardiovascular;  Laterality: N/A;  . TEE WITHOUT CARDIOVERSION N/A 02/23/2016   Procedure: TRANSESOPHAGEAL ECHOCARDIOGRAM (TEE);  Surgeon: Pricilla Riffle, MD;  Location: [facility] ENDO SUITE;  Service: Cardiovascular;  Laterality: N/A;  . THYROIDECTOMY    . TOTAL HIP ARTHROPLASTY Left  08/10/2015   Procedure: LEFT TOTAL HIP ARTHROPLASTY ANTERIOR APPROACH;  Surgeon: Kathryne Hitch, MD;  Location: [facility] OR;  Service: Orthopedics;  Laterality: Left;     Allergies  Allergies  Allergen Reactions  . Invokana [Canagliflozin] Rash     Home Medications    Prior  to Admission medications   Medication Sig Start Date End Date Taking? Authorizing Provider  aspirin (ECOTRIN) 325 MG EC tablet Take 325 mg by mouth every evening.     Historical Provider, MD  Cholecalciferol (VITAMIN D3) 5000 units CAPS Take 1 capsule by mouth daily.    Historical Provider, MD  diazepam (VALIUM) 5 MG tablet Take 5 mg by mouth at bedtime.  06/01/14   Historical Provider, MD  Ferrous Sulfate-C-Folic Acid 105-500-0.8 MG TBCR Take 500 mg by mouth 2 (two) times daily.     Historical Provider, MD  FLUoxetine (PROZAC) 40 MG capsule Take 40 mg by mouth daily.    Historical Provider, MD  HYDROcodone-acetaminophen (NORCO/VICODIN) 5-325 MG tablet take 1 tablet by mouth every 6 hours if needed for SEVERE PAIN 09/20/15   Historical Provider, MD  levothyroxine (SYNTHROID, LEVOTHROID) 200 MCG tablet Take 200 mcg by mouth daily before breakfast.    Historical Provider, MD  lisinopril (PRINIVIL,ZESTRIL) 10 MG tablet Take 10 mg by mouth at bedtime.     Historical Provider, MD  metFORMIN (GLUCOPHAGE-XR) 500 MG 24 hr tablet Take 500 mg by mouth every morning. & 2 in the evening 10/22/15   Historical Provider, MD  metoprolol tartrate (LOPRESSOR) 25 MG tablet take 1 tablet by mouth once daily Patient taking differently: take 1 tablet by mouth once daily in the evening 12/25/15   Wendall Stade, MD  omeprazole (PRILOSEC) 20 MG capsule Take 20 mg by mouth at bedtime.     Historical Provider, MD  potassium chloride (K-DUR) 10 MEQ tablet Take 1 tablet (10 mEq total) by mouth daily. 05/01/16 07/30/16  Jonelle Sidle, MD  rivaroxaban (XARELTO) 20 MG TABS tablet Take 1 tablet (20 mg total) by mouth daily with supper. 03/27/16   Wendall Stade, MD  simvastatin (ZOCOR) 40 MG tablet take 1 tablet by mouth at bedtime 01/25/16   Wendall Stade, MD  torsemide (DEMADEX) 20 MG tablet Take 2 tablets (40 mg total) by mouth daily. 05/01/16 07/30/16  Jonelle Sidle, MD  vitamin C (ASCORBIC ACID) 500 MG tablet Take 500 mg by mouth  2 (two) times daily.    Historical Provider, MD    Family History    Family History  Problem Relation Age of Onset  . Hypertension Father   . Hyperlipidemia Father   . CAD Father   . Diabetes Mother   . Hypertension Mother   . Cancer Mother   . CAD Mother   . Coronary artery disease Mother   . Stroke Mother   . Kidney disease Mother   . Hyperlipidemia Mother   . Diabetes Maternal Grandmother   . Diabetes Maternal Grandfather   . Diabetes Paternal Grandmother   . Diabetes Paternal Grandfather     Social History    Social History   Social History  . Marital status: Married    Spouse name: RITA  . Number of children: 1  . Years of education: N/A   Occupational History  . disabled    Social History Main Topics  . Smoking status: Former Smoker    Packs/day: 1.00    Years: 6.00    Types: Cigarettes  Start date: 04/02/1967    Quit date: 01/22/1974  . Smokeless tobacco: Never Used  . Alcohol use No  . Drug use: No  . Sexual activity: Not on file   Other Topics Concern  . Not on file   Social History Narrative  . No narrative on file     Review of Systems    General:  No chills, fever, night sweats or weight changes.  Cardiovascular:  No chest pain,  orthopnea, palpitations, paroxysmal nocturnal dyspnea. Positive for dyspnea on exertion and edema. Dermatological: No rash, lesions/masses Respiratory: No cough, Positive for dyspnea Urologic: No hematuria, dysuria Abdominal:   No nausea, vomiting, diarrhea, bright red blood per rectum, melena, or hematemesis Neurologic:  No visual changes, wkns, changes in mental status. All other systems reviewed and are otherwise negative except as noted above.  Physical Exam    Blood pressure 97/63, pulse 80, temperature 97.7 F (36.5 C), temperature source Oral, resp. rate 20, height 5\' 11"  (1.803 m), weight 297 lb 3.2 oz (134.8 kg), SpO2 97 %.  General: Obese Caucasian male appearing in no acute distress. Head:  Normocephalic, atraumatic, sclera non-icteric, no xanthomas, nares are without discharge.  Neck: No carotid bruits. JVD elevated to 10cm.  Lungs: Respirations regular and unlabored, rales at bases bilaterally Heart: Regular rate and rhythm. No S3 or S4.  2/6 holosystolic murmur at RUSB, no rubs, or gallops appreciated. Abdomen: Soft, non-tender, non-distended with normoactive bowel sounds. No hepatomegaly. No rebound/guarding. No obvious abdominal masses. Msk:  Strength and tone appear normal for age. No joint deformities or effusions. Extremities: No clubbing or cyanosis. 3+ pitting edema up to knees bilaterally.  Distal pedal pulses are 2+ bilaterally. Neuro: Alert and oriented X 3. Moves all extremities spontaneously. No focal deficits noted. Psych:  Responds to questions appropriately with a normal affect. Skin: No rashes or lesions noted  Labs    Troponin (Point of Care Test) No results for input(s): TROPIPOC in the last 72 hours. No results for input(s): CKTOTAL, CKMB, TROPONINI in the last 72 hours. Lab Results  Component Value Date   WBC 10.4 04/21/2016   HGB 7.9 (L) 04/21/2016   HCT 26.4 (L) 04/21/2016   MCV 77.6 (L) 04/21/2016   PLT 202 04/21/2016   No results for input(s): NA, K, CL, CO2, BUN, CREATININE, CALCIUM, PROT, BILITOT, ALKPHOS, ALT, AST, GLUCOSE in the last 168 hours.  Invalid input(s): LABALBU Lab Results  Component Value Date   CHOL 131 08/13/2013   HDL 48 08/13/2013   LDLCALC 59 08/13/2013   TRIG 122 08/13/2013   No results found for: DDIMER   Brain Natriuretic Peptide  Date/Time Value Ref Range Status  01/24/2012 11:50 AM 64.8 0.0 - 100.0 pg/mL Final   B Natriuretic Peptide  Date/Time Value Ref Range Status  04/20/2016 06:00 PM 1,471.0 (H) 0.0 - 100.0 pg/mL Final  02/21/2016 07:58 PM 443.0 (H) 0.0 - 100.0 pg/mL Final  11/03/2015 05:22 PM 398.0 (H) 0.0 - 100.0 pg/mL Final   No results found for: PROBNP No results for input(s): INR in the last 72  hours.    Radiology Studies    Dg Chest Portable 1 View  Result Date: 04/20/2016 CLINICAL DATA:  Shortness of breath history of endocarditis EXAM: PORTABLE CHEST 1 VIEW COMPARISON:  02/23/2016 FINDINGS: Median sternotomy wires are again evident. Left CP angle is not included. Stable enlarged cardiomediastinal silhouette, allowing for lordotic positioning. Mild central vascular congestion. No consolidation or large effusion. No pneumothorax. Linear opacities at the  thoracic inlet are unchanged. IMPRESSION: 1. Stable degree of cardiomegaly, there is mild central vascular congestion. 2. No large effusion or acute focal infiltrate. Electronically Signed   By: Jasmine PangKim  Fujinaga M.D.   On: 04/20/2016 18:29    EKG & Cardiac Imaging    ECHOCARDIOGRAM: 05/01/2016 Study Conclusions  - Left ventricle: The cavity size was normal. Wall thickness was   increased in a pattern of mild LVH. Systolic function was normal.   The estimated ejection fraction was in the range of 50% to 55%.   Wall motion was normal; there were no regional wall motion   abnormalities. Doppler parameters are consistent with restrictive   physiology, indicative of decreased left ventricular diastolic   compliance and/or increased left atrial pressure. - Ventricular septum: The contour showed diastolic flattening and   systolic flattening. - Aortic valve: 25mm [employer] [facility] Ease pericardial bioprosthesis   in aortic position. Moderately thickened leaflets. Cusp   separation was reduced. There was trivial regurgitation. Mean   gradient (S): 21 mm Hg. Peak gradient (S): 44 mm Hg. VTI ratio of   LVOT to aortic valve: 0.21. Valve area (VTI): 0.89 cm^2. - Mitral valve: Status post annuloplasty with posterior leaflet   augmentation. Mildly thickened leaflets . There was mild to   moderate regurgitation. - Left atrium: The atrium was moderately dilated. - Right ventricle: The cavity size was mildly to moderately   dilated. Systolic  function was reduced. - Right atrium: Central venous pressure (est): 15 mm Hg. - Atrial septum: A patent foramen ovale cannot be excluded. - Tricuspid valve: There was mild regurgitation. - Pulmonic valve: There was trivial regurgitation. - Pulmonary arteries: Systolic pressure was moderately increased.   PA peak pressure: 48 mm Hg (S). - Pericardium, extracardiac: There was no pericardial effusion.  Impressions:  - Mild LVH with LVEF 50-55%. There is systolic and diastolic septal   flattening with paradoxical motion consistent with right-sided   pressure and volume overload. Diastolic parameters suggest   restrictive filling pattern. Moderate left atrial enlargement.   Status post mitral annuloplasty with posterior leaflet   augmentation. Mitral leaflets are mildly thickened and there is   evidence of moderate mitral regurgitation based on some of the   apical views, although not well-defined. Status post   bioprosthetic AVR as outlined. Leaflets are moderately thickened   with reduced leaflet excursion. Mean gradient 21 mmHg. Trivial   aortic regurgitation. Cannot exclude prosthetic stenosis based on   this study. RV is moderately dilated and shows evidence of   reduced contraction. CVP elevated. Mild tricuspid regurgitation   with PASP estimated 48 mmHg.  Assessment & Plan    1. Acute on Chronic Diastolic CHF - recent echo shows preserved EF of 50-55%. Reports progressive orthopnea, dyspnea, and generalized edema for the past month. Has experienced a weight gain of over 25 pounds, with baseline weight being approximately 275 lbs. - check BNP and BMET. Will start on IV Lasix 40mg  BID. Would prefer higher dosing with his significant volume overload but BP currently hinders this. Will hold PTA Lisinopril dosing to allow further titration of Lasix. Would wait for respiratory status to improve before pursuing RHC.   2. Severe anemia, relatively chronic.  - Hemoglobin 7.9 in October.  Reportedly previous negative GI workup. He has pending outpatient consultation with hematology.  - recheck CBC. Might need to consult Oncology while Inpatient pending results.   3. History of bioprosthetic AVR and mitral valve repair with previous endocarditis - recent echocardiogram  showed omn 11/8.2017 showed mitral leaflets are mildly thickened and there is evidence of moderate mitral regurgitation based on some of the apical views, although not well-defined. Bioprosthetic AVR with leaflets which are moderately thickened with reduced leaflet excursion. Mean gradient 21 mmHg. Trivial aortic regurgitation. Cannot exclude prosthetic stenosis based on this study.   4. History of recurrent DVT and pulmonary embolus  - s/p IVC filter. Is on chronic anticoagulation with Xarelto. He has known postphlebitic syndrome as well. Will need to hold Xarelto 36 hours prior to his cath, but this will likely not be performed until next week. Will need to bridge with Heparin at that time.   5. CAD  - prior documented history of only mild coronary atherosclerosis by cath in 2011. - denies any recent episodes of chest discomfort.   6. Stage 3 CKD - baseline creatinine 1.0 - 1.1 - follow with daily BMET while receiving IV diuresis.   7. Type 2 DM - on Metformin PTA. Will hold and start SSI.  8. OSA - continue with CPAP  Signed, Ellsworth LennoxBrittany M Strader, PA-C 05/12/2016, 4:19 PM Pager: [phone]  The patient has been seen in conjunction with Randall AnBrittany Strader, PA-C. All aspects of care have been considered and discussed. The patient has been personally interviewed, examined, and all clinical data has been reviewed.   This is a complicated patient as noted above. He has a bioprosthetic aortic valve and mitral valve repair. Endocarditis was etiology of the patient's initial presentation. He additionally has a history of DVT and pulmonary emboli, stage III chronic kidney disease, diabetes mellitus, anemia,  chronic diastolic heart failure, obesity, obstructive sleep apnea, chronic anticoagulation, and is admitted now with progressive weight gain, lower extremity edema, and severe orthopnea.  Exam confirms volume overload with marked elevation and neck veins with the patient sitting at the bedside. Decreased breath sounds are noted at both bases. Cardiac exam reveals a low pitched systolic murmur at the right upper sternal border with a faint diastolic murmur of aortic regurgitation. There is also an apical systolic murmur compatible with mitral regurgitation. Marked bilateral lower extremity edema is noted. No EKG or laboratory data is available at the time of this attestation.  Acute on chronic diastolic heart failure with right heart failure and anasarca.  Start IV diuresis. TSH, BNP, and comprehensive metabolic panel will also be obtained.  Advanced heart 3 team consult is a must.  Eventual right heart cath and possible TEE.

## 2016-05-02 NOTE — Progress Notes (Addendum)
Assessed pt arms for iv stick.  Unable to place iv.  Notified iv team.  Amanda PeaNellie Tyan Lasure, RN.

## 2016-05-02 NOTE — Progress Notes (Signed)
F/U on orders for pt.  Card master instructed someone will be up shortly to see pt.  Pt made aware.  Amanda PeaNellie Giomar Gusler, RN

## 2016-05-02 NOTE — Progress Notes (Signed)
Unable to give pt p.o. Meds.  Notified pharmacist and instructed that meds have not been verified yet.  Amanda PeaNellie Lee-Anne Flicker, Charity fundraiserN.

## 2016-05-03 ENCOUNTER — Inpatient Hospital Stay (HOSPITAL_COMMUNITY): Payer: Medicare Other

## 2016-05-03 ENCOUNTER — Encounter (HOSPITAL_COMMUNITY): Payer: Self-pay | Admitting: General Practice

## 2016-05-03 DIAGNOSIS — I5033 Acute on chronic diastolic (congestive) heart failure: Secondary | ICD-10-CM | POA: Diagnosis present

## 2016-05-03 LAB — MRSA PCR SCREENING: MRSA by PCR: NEGATIVE

## 2016-05-03 LAB — COOXEMETRY PANEL
CARBOXYHEMOGLOBIN: 2.3 % — AB (ref 0.5–1.5)
METHEMOGLOBIN: 0.9 % (ref 0.0–1.5)
O2 SAT: 73.3 %
TOTAL HEMOGLOBIN: 7.9 g/dL — AB (ref 12.0–16.0)

## 2016-05-03 LAB — BASIC METABOLIC PANEL
ANION GAP: 8 (ref 5–15)
BUN: 29 mg/dL — ABNORMAL HIGH (ref 6–20)
CALCIUM: 8.6 mg/dL — AB (ref 8.9–10.3)
CO2: 22 mmol/L (ref 22–32)
Chloride: 106 mmol/L (ref 101–111)
Creatinine, Ser: 1.44 mg/dL — ABNORMAL HIGH (ref 0.61–1.24)
GFR, EST AFRICAN AMERICAN: 57 mL/min — AB (ref >60)
GFR, EST NON AFRICAN AMERICAN: 49 mL/min — AB (ref >60)
Glucose, Bld: 121 mg/dL — ABNORMAL HIGH (ref 65–99)
Potassium: 4.5 mmol/L (ref 3.5–5.1)
SODIUM: 136 mmol/L (ref 135–145)

## 2016-05-03 LAB — GLUCOSE, CAPILLARY: GLUCOSE-CAPILLARY: 137 mg/dL — AB (ref 65–99)

## 2016-05-03 MED ORDER — SODIUM CHLORIDE 0.9% FLUSH: 10.0000 mL | INTRAVENOUS | Status: DC | PRN

## 2016-05-03 MED ORDER — SODIUM CHLORIDE 0.9% FLUSH
10.0000 mL | Freq: Two times a day (BID) | INTRAVENOUS | Status: DC
  Administered 2016-05-03 – 2016-05-06 (×7): 10 mL

## 2016-05-03 MED ORDER — FUROSEMIDE 10 MG/ML IJ SOLN
80.0000 mg | Freq: Two times a day (BID) | INTRAMUSCULAR | Status: DC
  Administered 2016-05-03 – 2016-05-04 (×2): 80 mg via INTRAVENOUS
  Filled 2016-05-03 (×2): qty 8

## 2016-05-03 NOTE — Progress Notes (Signed)
Report called to [name], RN regarding pt being transferred to 2H23.  Also informed him that IV team just called to have picc line inserted.  Instructed pt can be transferred after picc inserted.  [name], Charity fundraiserN.

## 2016-05-03 NOTE — Progress Notes (Signed)
Peripherally Inserted Central Catheter/Midline Placement  The IV Nurse has discussed with the patient and/or persons authorized to consent for the patient, the purpose of this procedure and the potential benefits and risks involved with this procedure.  The benefits include less needle sticks, lab draws from the catheter, and the patient may be discharged home with the catheter. Risks include, but not limited to, infection, bleeding, blood clot (thrombus formation), and puncture of an artery; nerve damage and irregular heartbeat and possibility to perform a PICC exchange if needed/ordered by physician.  Alternatives to this procedure were also discussed.  Bard Power PICC patient education guide, fact sheet on infection prevention and patient information card has been provided to patient /or left at bedside.    PICC/Midline Placement Documentation        [name] [name], [name] [name] 05/03/2016, 2:54 PM

## 2016-05-03 NOTE — Consult Note (Signed)
Advanced Heart Failure Team Consult Note  Referring Physician: Dr. Mendel Ryder Primary Physician: Dr. Margo Common Primary Cardiologist:  Dr. Diona Browner  Reason for Consultation: A/C Diastolic CHF  HPI:    [name] [name] is a 65 y.o. 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 Diona Browner on 05/01/2016 and admission was recommended with worsening dyspnea over the past month.   Pertinent labs on admission include  K 4.2, Creatinine 1.40, BNP 1524.1, Hgb 8.2.   Echo 05/01/16 LVEF 50-55%, Diastolic dysfunction, not graded, Trivial AI with questionable bioprosthetic stenosis, Mean   gradient (S): 21 mm Hg. Peak gradient (S): 44 mm Hg., Mild/Mod MR, Mod LAE, RV mild moderately dilated/systolic function reduced. CVP (ESTIMATE) 15 mm Hg, Mild TR, trivial PI. PA peak pressure 48 mm Hg.   Of note, PA peak pressure on TTE 02/22/16 was measured as 87 mm Hg.   Pt states over past three weeks he has become increasingly dyspneic on exertion. Usually has no SOB on flat ground. Currently, SOB bathing or changing clothes.  + Bendopnea with dizziness on standing. + Orthopnea.  SOB walking room to room at home.  States his weight has gone up at least 20 lbs at home.  Usual "baseline" weight is 275. Drinks "lots of fluid" and eats ice throughout the day.  He had a [employer] sausage and egg biscuit for dinner last night prior to admission (1300 mg of sodium). Denies hematochezia or melena on Xarelto.  Review of Systems: [y] = yes, [ ]  = no   General: Weight gain [y]; Weight loss [ ] ; Anorexia [ ] ; Fatigue [ ] ; Fever [ ] ; Chills [ ] ; Weakness [ ]   Cardiac: Chest pain/pressure [ ] ; Resting SOB [ ] ; Exertional SOB [y]; Orthopnea [y]; Pedal Edema [y]; Palpitations [ ] ;  Syncope [ ] ; Presyncope [ ] ; Paroxysmal nocturnal dyspnea[ ]   Pulmonary: Cough [y]; Wheezing[ ] ; Hemoptysis[ ] ; Sputum [ ] ; Snoring [ ]   GI: Vomiting[ ] ; Dysphagia[ ] ; Melena[ ] ; Hematochezia [ ] ; Heartburn[ ] ; Abdominal pain [ ] ; Constipation [ ] ; Diarrhea [ ] ; BRBPR [ ]   GU: Hematuria[ ] ; Dysuria [ ] ; Nocturia[ ]   Vascular: Pain in legs with walking [ ] ; Pain in feet with lying flat [ ] ; Non-healing sores [ ] ; Stroke [ ] ; TIA [ ] ; Slurred speech [ ] ;  Neuro: Headaches[ ] ; Vertigo[ ] ; Seizures[ ] ; Paresthesias[ ] ;Blurred vision [ ] ; Diplopia [ ] ; Vision changes [ ]   Ortho/Skin: Arthritis [y]; Joint pain [y]; Muscle pain [ ] ; Joint swelling [ ] ; Back Pain [ ] ; Rash [ ]   Psych: Depression[ ] ; Anxiety[ ]   Heme: Bleeding problems [ ] ; Clotting disorders [ ] ; Anemia [ ]   Endocrine: Diabetes [y]; Thyroid dysfunction[ ]   Home Medications Prior to Admission medications   Medication Sig Start Date End Date Taking? Authorizing Provider  aspirin (ECOTRIN) 325 MG EC tablet Take 325 mg by mouth every evening.     Historical Provider, MD  Cholecalciferol (VITAMIN D3) 5000 units CAPS Take 1 capsule by mouth daily.    Historical Provider, MD  diazepam (VALIUM) 5 MG tablet Take 5 mg by mouth at bedtime.  06/01/14   Historical Provider, MD  Ferrous Sulfate-C-Folic Acid 105-500-0.8 MG TBCR Take  500 mg by mouth 2 (two) times daily.     Historical Provider, MD  FLUoxetine (PROZAC) 40 MG capsule Take 40 mg by mouth daily.    Historical Provider, MD  HYDROcodone-acetaminophen (NORCO/VICODIN) 5-325 MG tablet take 1 tablet by mouth every 6 hours if needed for SEVERE PAIN 09/20/15   Historical Provider, MD  levothyroxine (SYNTHROID, LEVOTHROID) 200 MCG tablet Take 200 mcg by mouth daily before breakfast.    Historical Provider, MD  lisinopril (PRINIVIL,ZESTRIL) 10 MG tablet Take 10 mg by mouth at bedtime.     Historical Provider, MD  metFORMIN (GLUCOPHAGE-XR) 500 MG 24 hr tablet Take 500 mg by mouth every morning. & 2 in  the evening 10/22/15   Historical Provider, MD  metoprolol tartrate (LOPRESSOR) 25 MG tablet take 1 tablet by mouth once daily Patient taking differently: take 1 tablet by mouth once daily in the evening 12/25/15   Wendall Stade, MD  omeprazole (PRILOSEC) 20 MG capsule Take 20 mg by mouth at bedtime.     Historical Provider, MD  potassium chloride (K-DUR) 10 MEQ tablet Take 1 tablet (10 mEq total) by mouth daily. 05/01/16 07/30/16  Jonelle Sidle, MD  rivaroxaban (XARELTO) 20 MG TABS tablet Take 1 tablet (20 mg total) by mouth daily with supper. 03/27/16   Wendall Stade, MD  simvastatin (ZOCOR) 40 MG tablet take 1 tablet by mouth at bedtime 01/25/16   Wendall Stade, MD  torsemide (DEMADEX) 20 MG tablet Take 2 tablets (40 mg total) by mouth daily. 05/01/16 07/30/16  Jonelle Sidle, MD  vitamin C (ASCORBIC ACID) 500 MG tablet Take 500 mg by mouth 2 (two) times daily.    Historical Provider, MD    Past Medical History: Past Medical History:  Diagnosis Date  . Anemia   . 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 Arthritis   . CAD (coronary artery disease) 2008, 2011   Mild coronary atherosclerosis by cardiac catheterization  . Deep venous thrombosis (HCC)    Recurrent DVT and PE, status post IVC filter  . Depression   . Essential hypertension   . GERD (gastroesophageal reflux disease)   . 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 Hyperlipidemia   . 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 Obesity   . OSA (obstructive sleep apnea)    CPAP  . Postphlebitic syndrome    Recurrent DVT  . Restless leg syndrome   . Type 2 diabetes mellitus (HCC)     Past Surgical History: Past Surgical History:  Procedure Laterality Date  . AORTIC VALVE REPLACEMENT  04/10/2011   25mm [facility] Ease pericardial bioprosthetic aortic valve  .  CARDIAC CATHETERIZATION  2008  . CARDIAC CATHETERIZATION  07/2009   Minor irregularities without obstructive disease  . COLONOSCOPY W/ POLYPECTOMY    . ESOPHAGOGASTRODUODENOSCOPY ENDOSCOPY    . IVC FILTER PLACEMENT ([facility] HX)    . MITRAL VALVE REPAIR  04/10/2011   Autologous pericardial patch augmentation of posterior leaflet with 28mm Sorin Memo 3D ring annuloplasty  . TEE WITHOUT CARDIOVERSION N/A 11/06/2015   Procedure: TRANSESOPHAGEAL ECHOCARDIOGRAM (TEE);  Surgeon: Chilton Si, MD;  Location: [facility] ENDOSCOPY;  Service: Cardiovascular;  Laterality: N/A;  . TEE WITHOUT CARDIOVERSION N/A 02/23/2016   Procedure: TRANSESOPHAGEAL ECHOCARDIOGRAM (TEE);  Surgeon: Pricilla Riffle, MD;  Location: [facility] ENDO SUITE;  Service: Cardiovascular;  Laterality:  N/A;  . THYROIDECTOMY    . TOTAL HIP ARTHROPLASTY Left 08/10/2015   Procedure: LEFT TOTAL HIP ARTHROPLASTY ANTERIOR APPROACH;  Surgeon: Kathryne Hitch, MD;  Location: [facility] OR;  Service: Orthopedics;  Laterality: Left;    Family History: Family History  Problem Relation Age of Onset  . Hypertension Father   . Hyperlipidemia Father   . CAD Father   . Diabetes Mother   . Hypertension Mother   . Cancer Mother   . CAD Mother   . Coronary artery disease Mother   . Stroke Mother   . Kidney disease Mother   . Hyperlipidemia Mother   . Diabetes Maternal Grandmother   . Diabetes Maternal Grandfather   . Diabetes Paternal Grandmother   . Diabetes Paternal Grandfather     Social History: Social History   Social History  . Marital status: Married    Spouse name: RITA  . Number of children: 1  . Years of education: N/A   Occupational History  . disabled    Social History Main Topics  . Smoking status: Former Smoker    Packs/day: 1.00    Years: 6.00    Types: Cigarettes    Start date: 04/02/1967    Quit date: 01/22/1974  . Smokeless tobacco: Never Used  . Alcohol use No  . Drug use: No  . Sexual activity: Not on file   Other Topics  Concern  . Not on file   Social History Narrative  . No narrative on file    Allergies:  Allergies  Allergen Reactions  . Invokana [Canagliflozin] Rash    Objective:    Vital Signs:   Temp:  [97.1 F (36.2 C)-97.7 F (36.5 C)] 97.1 F (36.2 C) (11/10 0435) Pulse Rate:  [73-80] 78 (11/10 0435) Resp:  [17-20] 18 (11/10 0435) BP: (93-108)/(63-75) 93/71 (11/10 0435) SpO2:  [97 %-98 %] 98 % (11/10 0435) Weight:  [290 lb 9.6 oz (131.8 kg)-297 lb 3.2 oz (134.8 kg)] 290 lb 9.6 oz (131.8 kg) (11/10 0435) Last BM Date: 05/27/2016  Weight change: Filed Weights   May 27, 2016 1416 05/03/16 0435  Weight: 297 lb 3.2 oz (134.8 kg) 290 lb 9.6 oz (131.8 kg)    Intake/Output:   Intake/Output Summary (Last 24 hours) at 05/03/16 0944 Last data filed at 05/03/16 0800  Gross per 24 hour  Intake              723 ml  Output              750 ml  Net              -27 ml     Physical Exam: General:  Appears older than stated age. Markedly obese.  HEENT: Normal Neck: Thick. JVP difficult to assess but appears 14+ cm. Carotids 2+ bilat; no bruits. No lymphadenopathy or thyromegaly appreciated. Cor: Regular rate and rhythm. 2/6 holosystolic murmur at RUSB. No S3/S4 Lungs: Distant, diminished bibasilar breath sounds.  Abdomen: Obese, edematous flanks, non-tender, ? Hepatomegaly. No bruits or masses. +BS. Extremities: no cyanosis, clubbing, rash. 2-3 + edema up into flanks.  Neuro: alert & orientedx3, cranial nerves grossly intact. moves all 4 extremities w/o difficulty. Affect pleasant  Telemetry: Reviewed personally, NSR  Labs: Basic Metabolic Panel:  Recent Labs Lab May 27, 2016 1821 05/03/16 0417  NA 135 136  K 4.2 4.5  CL 107 106  CO2 20* 22  GLUCOSE 147* 121*  BUN 29* 29*  CREATININE 1.40* 1.44*  CALCIUM 8.6* 8.6*  Liver Function Tests: No results for input(s): AST, ALT, ALKPHOS, BILITOT, PROT, ALBUMIN in the last 168 hours. No results for input(s): LIPASE, AMYLASE in the last  168 hours. No results for input(s): AMMONIA in the last 168 hours.  CBC:  Recent Labs Lab Feb 29, 2016 1821  WBC 6.6  NEUTROABS 5.2  HGB 8.2*  HCT 27.8*  MCV 77.4*  PLT 212    Cardiac Enzymes: No results for input(s): CKTOTAL, CKMB, CKMBINDEX, TROPONINI in the last 168 hours.  BNP: BNP (last 3 results)  Recent Labs  02/21/16 1958 04/20/16 1800 Feb 29, 2016 1821  BNP 443.0* 1,471.0* 1,524.1*    ProBNP (last 3 results) No results for input(s): PROBNP in the last 8760 hours.   CBG:  Recent Labs Lab Feb 29, 2016 1626  GLUCAP 125*    Coagulation Studies: No results for input(s): LABPROT, INR in the last 72 hours.  Other results: EKG: 04/20/16 NSR 83 bpm, QTc 500  Imaging:  No results found.   Medications:     Current Medications: . cholecalciferol  5,000 Units Oral Daily  . diazepam  5 mg Oral QHS  . FLUoxetine  40 mg Oral Daily  . furosemide  60 mg Intravenous Q12H  . levothyroxine  200 mcg Oral QAC breakfast  . metoprolol tartrate  25 mg Oral QPM  . pantoprazole  40 mg Oral Daily  . potassium chloride  10 mEq Oral Daily  . rivaroxaban  20 mg Oral Q supper  . simvastatin  40 mg Oral QHS  . sodium chloride flush  3 mL Intravenous Q12H     Infusions:    Assessment/Plan   [name] [name] is a 65 y.o. male is a patient with chronic diastolic CHF, AS s/p AVR 2012 with MV repair at same time, mild CAD by cath in 2011, recurrent DVT/PE s/p IVC filter placement, DM2, HTN, HLD, and OSA on CPAP admitted for marked volume overload and worsening DOE. AHF team consulted for same.   1. Acute on chronic diastolic heart failure - Echo 16/06/909/8/17 LVEF 50-55%, RV mild/mod dilation and reduced function.  - Suspect R>L HF.  Pt remains  markedly volume overloaded on exam and he continues to dyspneic with mild exertion, much worse than his baseline. -  Increase lasix to 80 mg IV BID. Watch pressures.   - Lisinopril on hold to leave room for diuresis.  - Pt will need RHC,  likely after diuresing over the weekend.  Could consider PICC line for CVP in the meantime.  [facility].  Could repeat with RHC 2. Hx of bioprosthetic AVR and MV repair with previous endocarditis - AVR with ? Prosthetic stenosis on Echo 05/01/16. Mean gradient on TTE 21, but much worse via TEE 10/2015 - MV leaflet mildly thickened with moderate MR on Echo - May need TEE to further look at valvular disease.  3. CAD - Mild by cath 2011 as above. No CP.  4. Stage 3 CKD - Follow closely with diuresis 5. Hx of recurrent DVT and PE - IVC filter in place - On Xarelto. Would need to be held prior to cath. Can use heparin.  6. OSA - Continue nightly CPAP.  7. DM2 - SSI while in hospital 8. Severe, chronic anemia - 8.2 on admission. Relatively stable. No overt bleeding.  GI and heme following as outpatient.   Pt is markedly volume overloaded on exam with edema into flanks with R>L HF. His pressures are soft and may limit diuresis.  May need PICC line. Will likely need RHC +/- LHC next week after some diuresis.  May also need TEE to further evaluate AS of bioprosthetic valve  Length of Stay: 1  Graciella FreerMichael Andrew Tillery PA-C 05/03/2016, 9:44 AM  Advanced Heart Failure Team Pager [phone] (M-F; 7a - 4p)  Please contact [facility] for night-coverage after hours (4p -7a ) and weekends on [email or web address]  Patient seen with PA, agree with the above note.  He is markedly volume overloaded on exam with significant RV failure.  - Diurese with Lasix 80 mg IV bid.  - Place PICC and follow CVP.  - He will need RHC, likely early next week. Hold Xarelto prior to cath.   He has a bioprosthetic AVR.  There is significant concern for severe stenosis => no mean gradient reported on 9/17 TEE, but on 5/17 TEE mean gradient was 52 mmHg.  He has a repaired mitral valve with at least moderate MR on last echo.  He needs a repeat TEE to reassess the aortic and mitral valves.   Marca AnconaDalton  Markeese [name] 05/03/2016 5:32 PM

## 2016-05-03 NOTE — Progress Notes (Signed)
RT NOTE:  CPAP setup @ bedside. Pt stated he can operate machine without RT. Will call if he has questions

## 2016-05-03 NOTE — Discharge Instructions (Signed)
Information on my medicine - XARELTO (Rivaroxaban)  This medication education was reviewed with me or my healthcare representative as part of my discharge preparation.  The pharmacist that spoke with me during my hospital stay was:  Fredrik RiggerMarkle, Mckinleigh Schuchart Sue, [facility] CenterRPH  Why was Xarelto prescribed for you? Xarelto was prescribed for you to reduce the risk of a blood clot forming in your legs and lungs.  What do you need to know about xarelto ? Take your Xarelto ONCE DAILY at the same time every day with your evening meal. If you have difficulty swallowing the tablet whole, you may crush it and mix in applesauce just prior to taking your dose.  Take Xarelto exactly as prescribed by your doctor and DO NOT stop taking Xarelto without talking to the doctor who prescribed the medication. Refill your prescription before you run out.  After discharge, you should have regular check-up appointments with your healthcare provider that is prescribing your Xarelto.  In the future your dose may need to be changed if your kidney function or weight changes by a significant amount.  What do you do if you miss a dose? If you are taking Xarelto ONCE DAILY and you miss a dose, take it as soon as you remember on the same day then continue your regularly scheduled once daily regimen the next day. Do not take two doses of Xarelto at the same time or on the same day.   Important Safety Information A possible side effect of Xarelto is bleeding. You should call your healthcare provider right away if you experience any of the following: ? Bleeding from an injury or your nose that does not stop. ? Unusual colored urine (red or dark brown) or unusual colored stools (red or black). ? Unusual bruising for unknown reasons. ? A serious fall or if you hit your head (even if there is no bleeding).  Some medicines may interact with Xarelto and might increase your risk of bleeding while on Xarelto. To help avoid this, consult  your healthcare provider or pharmacist prior to using any new prescription or non-prescription medications, including herbals, vitamins, non-steroidal anti-inflammatory drugs (NSAIDs) and supplements.  This website has more information on Xarelto: [email or web address].xarelto.[email or web address].

## 2016-05-04 LAB — BASIC METABOLIC PANEL
ANION GAP: 10 (ref 5–15)
ANION GAP: 10 (ref 5–15)
BUN: 33 mg/dL — ABNORMAL HIGH (ref 6–20)
BUN: 35 mg/dL — ABNORMAL HIGH (ref 6–20)
CHLORIDE: 104 mmol/L (ref 101–111)
CO2: 19 mmol/L — AB (ref 22–32)
CO2: 20 mmol/L — AB (ref 22–32)
CREATININE: 1.7 mg/dL — AB (ref 0.61–1.24)
Calcium: 8.6 mg/dL — ABNORMAL LOW (ref 8.9–10.3)
Calcium: 8.6 mg/dL — ABNORMAL LOW (ref 8.9–10.3)
Chloride: 104 mmol/L (ref 101–111)
Creatinine, Ser: 1.57 mg/dL — ABNORMAL HIGH (ref 0.61–1.24)
GFR calc Af Amer: 51 mL/min — ABNORMAL LOW (ref >60)
GFR calc non Af Amer: 40 mL/min — ABNORMAL LOW (ref >60)
GFR, EST AFRICAN AMERICAN: 47 mL/min — AB (ref >60)
GFR, EST NON AFRICAN AMERICAN: 44 mL/min — AB (ref >60)
GLUCOSE: 200 mg/dL — AB (ref 65–99)
Glucose, Bld: 165 mg/dL — ABNORMAL HIGH (ref 65–99)
POTASSIUM: 4 mmol/L (ref 3.5–5.1)
POTASSIUM: 4 mmol/L (ref 3.5–5.1)
Sodium: 133 mmol/L — ABNORMAL LOW (ref 135–145)
Sodium: 134 mmol/L — ABNORMAL LOW (ref 135–145)

## 2016-05-04 LAB — CBC WITH DIFFERENTIAL/PLATELET
BASOS PCT: 0 %
Basophils Absolute: 0 10*3/uL (ref 0.0–0.1)
Eosinophils Absolute: 0 10*3/uL (ref 0.0–0.7)
Eosinophils Relative: 0 %
HEMATOCRIT: 28.1 % — AB (ref 39.0–52.0)
HEMOGLOBIN: 8.1 g/dL — AB (ref 13.0–17.0)
Lymphocytes Relative: 10 %
Lymphs Abs: 1 10*3/uL (ref 0.7–4.0)
MCH: 22.1 pg — ABNORMAL LOW (ref 26.0–34.0)
MCHC: 28.8 g/dL — AB (ref 30.0–36.0)
MCV: 76.8 fL — ABNORMAL LOW (ref 78.0–100.0)
MONOS PCT: 3 %
Monocytes Absolute: 0.3 10*3/uL (ref 0.1–1.0)
NEUTROS ABS: 8.3 10*3/uL — AB (ref 1.7–7.7)
NEUTROS PCT: 87 %
Platelets: 209 10*3/uL (ref 150–400)
RBC: 3.66 MIL/uL — AB (ref 4.22–5.81)
RDW: 19.9 % — ABNORMAL HIGH (ref 11.5–15.5)
WBC: 9.6 10*3/uL (ref 4.0–10.5)

## 2016-05-04 LAB — GLUCOSE, CAPILLARY: GLUCOSE-CAPILLARY: 144 mg/dL — AB (ref 65–99)

## 2016-05-04 LAB — MAGNESIUM: Magnesium: 1.7 mg/dL (ref 1.7–2.4)

## 2016-05-04 MED ORDER — METOLAZONE 2.5 MG PO TABS
2.5000 mg | ORAL_TABLET | Freq: Once | ORAL | Status: AC
  Administered 2016-05-04: 2.5 mg via ORAL
  Filled 2016-05-04: qty 1

## 2016-05-04 MED ORDER — MAGNESIUM SULFATE 50 % IJ SOLN
3.0000 g | Freq: Once | INTRAVENOUS | Status: AC
  Administered 2016-05-04: 3 g via INTRAVENOUS
  Filled 2016-05-04: qty 6

## 2016-05-04 MED ORDER — POTASSIUM CHLORIDE CRYS ER 20 MEQ PO TBCR
40.0000 meq | EXTENDED_RELEASE_TABLET | Freq: Once | ORAL | Status: AC
  Administered 2016-05-04: 40 meq via ORAL
  Filled 2016-05-04: qty 2

## 2016-05-04 MED ORDER — FUROSEMIDE 10 MG/ML IJ SOLN
80.0000 mg | Freq: Three times a day (TID) | INTRAMUSCULAR | Status: DC
  Administered 2016-05-04 – 2016-05-06 (×5): 80 mg via INTRAVENOUS
  Filled 2016-05-04 (×5): qty 8

## 2016-05-04 NOTE — Progress Notes (Signed)
Cardiac Monitoring Event  Dysrhythmia:  Atrial fibrillation  Symptoms:  Asymptomatic  Level of Consciousness:  Alert  Last set of vital signs taken:  Temp: 97.4 F (36.3 C)  Pulse Rate: 92  Resp: (!) 21  BP: 96/78 (NP notified)  SpO2: 95 %  Name of MD Notified:  Burge  Time MD Notified:  1050

## 2016-05-04 NOTE — Progress Notes (Signed)
Pt. Had 5 beat run of V tach at 0537. Provider consulted. Will continue to monitor

## 2016-05-04 NOTE — Progress Notes (Signed)
Patient ID: [name] PearlSteven C Asmar, male   DOB: 02/03/1951, 65 y.o.   MRN: [id number]    SUBJECTIVE: Mildly tachypneic, short of breath with any exertion.  Chart reports atrial fibrillation but telemetry reviewed, and appears that he has had mild sinus tachycardia with frequent PACs.  No atrial fibrillation noted.    Scheduled Meds: . cholecalciferol  5,000 Units Oral Daily  . diazepam  5 mg Oral QHS  . FLUoxetine  40 mg Oral Daily  . furosemide  80 mg Intravenous Q8H  . levothyroxine  200 mcg Oral QAC breakfast  . magnesium sulfate 1 - 4 g bolus IVPB  3 g Intravenous Once  . metolazone  2.5 mg Oral Once  . pantoprazole  40 mg Oral Daily  . potassium chloride  10 mEq Oral Daily  . potassium chloride  40 mEq Oral Once  . rivaroxaban  20 mg Oral Q supper  . simvastatin  40 mg Oral QHS  . sodium chloride flush  10-40 mL Intracatheter Q12H  . sodium chloride flush  3 mL Intravenous Q12H   Continuous Infusions: PRN Meds:.sodium chloride, acetaminophen, ondansetron (ZOFRAN) IV, sodium chloride flush, sodium chloride flush    Vitals:   05/04/16 0400 05/04/16 0600 05/04/16 0800 05/04/16 1000  BP: 112/88 96/82 97/83  96/78  Pulse: 89 91 92 92  Resp: (!) 24 19 19  (!) 21  Temp:   97.4 F (36.3 C)   TempSrc:   Oral   SpO2: 94% 94% 95% 95%  Weight:      Height:        Intake/Output Summary (Last 24 hours) at 05/04/16 1121 Last data filed at 05/04/16 0900  Gross per 24 hour  Intake              600 ml  Output             2550 ml  Net            -1950 ml    LABS: Basic Metabolic Panel:  Recent Labs  96/29/5210/04/09 0417 05/04/16 1035  NA 136 133*  K 4.5 4.0  CL 106 104  CO2 22 19*  GLUCOSE 121* 200*  BUN 29* 33*  CREATININE 1.44* 1.57*  CALCIUM 8.6* 8.6*  MG  --  1.7   Liver Function Tests: No results for input(s): AST, ALT, ALKPHOS, BILITOT, PROT, ALBUMIN in the last 72 hours. No results for input(s): LIPASE, AMYLASE in the last 72 hours. CBC:  Recent Labs  July 31, 2015 1821  05/04/16 1035  WBC 6.6 9.6  NEUTROABS 5.2 8.3*  HGB 8.2* 8.1*  HCT 27.8* 28.1*  MCV 77.4* 76.8*  PLT 212 209   Cardiac Enzymes: No results for input(s): CKTOTAL, CKMB, CKMBINDEX, TROPONINI in the last 72 hours. BNP: Invalid input(s): POCBNP D-Dimer: No results for input(s): DDIMER in the last 72 hours. Hemoglobin A1C: No results for input(s): HGBA1C in the last 72 hours. Fasting Lipid Panel: No results for input(s): CHOL, HDL, LDLCALC, TRIG, CHOLHDL, LDLDIRECT in the last 72 hours. Thyroid Function Tests:  Recent Labs  July 31, 2015 1933  TSH 2.739   Anemia Panel: No results for input(s): VITAMINB12, FOLATE, FERRITIN, TIBC, IRON, RETICCTPCT in the last 72 hours.  RADIOLOGY: Dg Chest Port 1 View  Result Date: 05/03/2016 CLINICAL DATA:  PICC line placement. EXAM: PORTABLE CHEST 1 VIEW COMPARISON:  04/20/2016 FINDINGS: Right arm PICC line is seen in place with tip overlying the superior cavoatrial junction. Cardiomegaly stable. Increased diffuse interstitial infiltrates are seen, suspicious for  pulmonary interstitial edema. No evidence of focal consolidation. Lung bases are not visualized on this study. Previous median sternotomy noted, with surgical clips in the superior mediastinum likely from prior thyroidectomy. IMPRESSION: Right arm PICC line tip overlies the superior cavoatrial junction. Increased diffuse interstitial infiltrates, suspicious for interstitial edema. Stable cardiomegaly. Electronically Signed   By: Myles RosenthalJohn  Stahl M.D.   On: 05/03/2016 17:24   Dg Chest Portable 1 View  Result Date: 04/20/2016 CLINICAL DATA:  Shortness of breath history of endocarditis EXAM: PORTABLE CHEST 1 VIEW COMPARISON:  02/23/2016 FINDINGS: Median sternotomy wires are again evident. Left CP angle is not included. Stable enlarged cardiomediastinal silhouette, allowing for lordotic positioning. Mild central vascular congestion. No consolidation or large effusion. No pneumothorax. Linear opacities at  the thoracic inlet are unchanged. IMPRESSION: 1. Stable degree of cardiomegaly, there is mild central vascular congestion. 2. No large effusion or acute focal infiltrate. Electronically Signed   By: Jasmine PangKim  Fujinaga M.D.   On: 04/20/2016 18:29    PHYSICAL EXAM General: NAD Neck: JVP 16 cm, no thyromegaly or thyroid nodule.  Lungs: Decreased breath sounds at bases CV: Nondisplaced PMI.  Heart mildly tachy, regular S1/S2, no S3/S4, 2/6 SEM RUSB.  2+ edema to thighs.   Abdomen: Soft, nontender, no hepatosplenomegaly, no distention.  Neurologic: Alert and oriented x 3.  Psych: Normal affect. Extremities: No clubbing or cyanosis.   TELEMETRY: Reviewed telemetry pt in NSR with frequent PACs, short NSVT run  ASSESSMENT AND PLAN: [name] [name] is a 65 y.o. male is a patient with chronic diastolic CHF, AS s/p bioprosthetic AVR 2012 with MV repair at same time, mild CAD by cath in 2011, recurrent DVT/PE s/p IVC filter placement, DM2, HTN, HLD, and OSA on CPAP admitted for marked volume overload and worsening DOE. AHF team consulted for same.   1. Acute on chronic diastolic heart failure: Echo 05/01/16 LVEF 50-55%, RV mild/mod dilation and reduced function. Suspect R>L HF.  Concerned that degeneration of bioprosthetic aortic valve may be driving CHF.  Pt remains markedly volume overloaded on exam and he continues to dyspneic with mild exertion, much worse than his baseline. Some diuresis overnight, creatinine mildly elevated.  CVP 18 today.  Co-ox preserved at 73%.  - Lasix 80 mg IV every 8 hrs, 1 dose metolazone 2.5 today.     - Lisinopril on hold with soft BP and elevated creatinine.  - Pt will need RHC, likely after diuresing over the weekend.  Currently following CVP off PICC.  2. Hx of bioprosthetic AVR and MV repair with previous endocarditis:  There is significant concern for severe stenosis of the bioprosthetic aortic valve => no mean gradient reported on 9/17 TEE, but on 5/17 TEE mean gradient was  52 mmHg.  He has a repaired mitral valve with at least moderate MR on last echo.  He needs a repeat TEE to reassess the aortic and mitral valves.  - TEE next week when better diuresed.  3. CAD: Mild by cath 2011 as above. No CP.  4. Stage 3 CKD: Follow closely with diuresis.  Creatinine up today.  Hopefully will stabilize as we pull fluid off and drop renal venous pressure.  5. Hx of recurrent DVT and PE: IVC filter in place - On Xarelto. Will hold for 1 day prior to RHC, possibly Monday.  6. OSA: Continue nightly CPAP.  7. DM2: SSI while in hospital 8. Chronic anemia: Relatively stable. No overt bleeding.  GI and heme following as outpatient.  9.  Rhythm: So far, I have only seen NSR with PACs, no atrial fibrillation.  Continue to follow.   Marca Ancona 05/04/2016 11:29 AM

## 2016-05-05 LAB — BASIC METABOLIC PANEL
ANION GAP: 11 (ref 5–15)
BUN: 34 mg/dL — AB (ref 6–20)
CALCIUM: 8.8 mg/dL — AB (ref 8.9–10.3)
CO2: 22 mmol/L (ref 22–32)
CREATININE: 1.66 mg/dL — AB (ref 0.61–1.24)
Chloride: 102 mmol/L (ref 101–111)
GFR calc Af Amer: 48 mL/min — ABNORMAL LOW (ref >60)
GFR, EST NON AFRICAN AMERICAN: 41 mL/min — AB (ref >60)
GLUCOSE: 148 mg/dL — AB (ref 65–99)
Potassium: 3.5 mmol/L (ref 3.5–5.1)
Sodium: 135 mmol/L (ref 135–145)

## 2016-05-05 LAB — CBC
HCT: 27.7 % — ABNORMAL LOW (ref 39.0–52.0)
HEMOGLOBIN: 8 g/dL — AB (ref 13.0–17.0)
MCH: 22 pg — ABNORMAL LOW (ref 26.0–34.0)
MCHC: 28.9 g/dL — AB (ref 30.0–36.0)
MCV: 76.3 fL — ABNORMAL LOW (ref 78.0–100.0)
PLATELETS: 202 10*3/uL (ref 150–400)
RBC: 3.63 MIL/uL — ABNORMAL LOW (ref 4.22–5.81)
RDW: 20.2 % — AB (ref 11.5–15.5)
WBC: 10.8 10*3/uL — ABNORMAL HIGH (ref 4.0–10.5)

## 2016-05-05 LAB — GLUCOSE, CAPILLARY
GLUCOSE-CAPILLARY: 158 mg/dL — AB (ref 65–99)
GLUCOSE-CAPILLARY: 131 mg/dL — AB (ref 65–99)
GLUCOSE-CAPILLARY: 125 mg/dL — AB (ref 65–99)
Glucose-Capillary: 150 mg/dL — ABNORMAL HIGH (ref 65–99)
Glucose-Capillary: 124 mg/dL — ABNORMAL HIGH (ref 65–99)

## 2016-05-05 MED ORDER — NYSTATIN 100000 UNIT/GM EX POWD
Freq: Two times a day (BID) | CUTANEOUS | Status: DC
  Administered 2016-05-05 – 2016-05-06 (×3): via TOPICAL
  Filled 2016-05-05: qty 15

## 2016-05-05 MED ORDER — NYSTATIN 100000 UNIT/GM EX POWD
Freq: Two times a day (BID) | CUTANEOUS | Status: DC
  Filled 2016-05-05: qty 15

## 2016-05-05 MED ORDER — METOLAZONE 2.5 MG PO TABS
2.5000 mg | ORAL_TABLET | Freq: Two times a day (BID) | ORAL | Status: DC
  Administered 2016-05-05 (×2): 2.5 mg via ORAL
  Filled 2016-05-05 (×2): qty 1

## 2016-05-05 MED ORDER — POTASSIUM CHLORIDE CRYS ER 20 MEQ PO TBCR
40.0000 meq | EXTENDED_RELEASE_TABLET | Freq: Once | ORAL | Status: AC
  Administered 2016-05-05: 40 meq via ORAL
  Filled 2016-05-05: qty 2

## 2016-05-05 MED ORDER — POTASSIUM CHLORIDE CRYS ER 20 MEQ PO TBCR
40.0000 meq | EXTENDED_RELEASE_TABLET | Freq: Two times a day (BID) | ORAL | Status: DC
  Filled 2016-05-05: qty 2

## 2016-05-05 NOTE — Progress Notes (Signed)
Rehab Admissions Coordinator Note:  Patient was screened by Trish MageLogue, Kavian Peters M for appropriateness for an Inpatient Acute Rehab Consult.  At this time, we are recommending Skilled Nursing Facility. Given current diagnosis, [facility] acute inpatient [facility] stay.  Call me for questions.  Trish MageLogue, Verdia Bolt M 05/05/2016, 6:09 PM  I can be reached at [phone].

## 2016-05-05 NOTE — Progress Notes (Signed)
Patient ID: [name] [name], male   DOB: 11/18/1950, 65 y.o.   MRN: [id number]    SUBJECTIVE: He remains short of breath with any exertion.  Mild sinus tachycardia with frequent PACs still.  He has been diuresing and weight is going down but UOP not recorded => he has been incontinent, wetting bed and floor extensively.   Scheduled Meds: . cholecalciferol  5,000 Units Oral Daily  . diazepam  5 mg Oral QHS  . FLUoxetine  40 mg Oral Daily  . furosemide  80 mg Intravenous Q8H  . levothyroxine  200 mcg Oral QAC breakfast  . metolazone  2.5 mg Oral BID  . nystatin   Topical BID  . pantoprazole  40 mg Oral Daily  . potassium chloride  40 mEq Oral Once  . potassium chloride  40 mEq Oral BID  . rivaroxaban  20 mg Oral Q supper  . simvastatin  40 mg Oral QHS  . sodium chloride flush  10-40 mL Intracatheter Q12H  . sodium chloride flush  3 mL Intravenous Q12H   Continuous Infusions: PRN Meds:.sodium chloride, acetaminophen, ondansetron (ZOFRAN) IV, sodium chloride flush, sodium chloride flush    Vitals:   05/05/16 0400 05/05/16 0600 05/05/16 0729 05/05/16 0800  BP: 97/82 101/85 90/62 (!) 84/67  Pulse: (!) 102 (!) 49 (!) 111 (!) 101  Resp: (!) 25 20 (!) 26 (!) 25  Temp:      TempSrc:      SpO2: 93% 94% 95% 92%  Weight:      Height:        Intake/Output Summary (Last 24 hours) at 05/05/16 1007 Last data filed at 05/05/16 0414  Gross per 24 hour  Intake              660 ml  Output              450 ml  Net              210 ml    LABS: Basic Metabolic Panel:  Recent Labs  40/98/1110/05/10 1035 05/04/16 1200 05/05/16 0458  NA 133* 134* 135  K 4.0 4.0 3.5  CL 104 104 102  CO2 19* 20* 22  GLUCOSE 200* 165* 148*  BUN 33* 35* 34*  CREATININE 1.57* 1.70* 1.66*  CALCIUM 8.6* 8.6* 8.8*  MG 1.7  --   --    Liver Function Tests: No results for input(s): AST, ALT, ALKPHOS, BILITOT, PROT, ALBUMIN in the last 72 hours. No results for input(s): LIPASE, AMYLASE in the last 72  hours. CBC:  Recent Labs  05/21/2016 1821 05/04/16 1035 05/05/16 0458  WBC 6.6 9.6 10.8*  NEUTROABS 5.2 8.3*  --   HGB 8.2* 8.1* 8.0*  HCT 27.8* 28.1* 27.7*  MCV 77.4* 76.8* 76.3*  PLT 212 209 202   Cardiac Enzymes: No results for input(s): CKTOTAL, CKMB, CKMBINDEX, TROPONINI in the last 72 hours. BNP: Invalid input(s): POCBNP D-Dimer: No results for input(s): DDIMER in the last 72 hours. Hemoglobin A1C: No results for input(s): HGBA1C in the last 72 hours. Fasting Lipid Panel: No results for input(s): CHOL, HDL, LDLCALC, TRIG, CHOLHDL, LDLDIRECT in the last 72 hours. Thyroid Function Tests:  Recent Labs  05/09/2016 1933  TSH 2.739   Anemia Panel: No results for input(s): VITAMINB12, FOLATE, FERRITIN, TIBC, IRON, RETICCTPCT in the last 72 hours.  RADIOLOGY: Dg Chest Port 1 View  Result Date: 05/03/2016 CLINICAL DATA:  PICC line placement. EXAM: PORTABLE CHEST 1 VIEW COMPARISON:  04/20/2016 FINDINGS: Right arm PICC line is seen in place with tip overlying the superior cavoatrial junction. Cardiomegaly stable. Increased diffuse interstitial infiltrates are seen, suspicious for pulmonary interstitial edema. No evidence of focal consolidation. Lung bases are not visualized on this study. Previous median sternotomy noted, with surgical clips in the superior mediastinum likely from prior thyroidectomy. IMPRESSION: Right arm PICC line tip overlies the superior cavoatrial junction. Increased diffuse interstitial infiltrates, suspicious for interstitial edema. Stable cardiomegaly. Electronically Signed   By: Myles Rosenthal M.D.   On: 05/03/2016 17:24   Dg Chest Portable 1 View  Result Date: 04/20/2016 CLINICAL DATA:  Shortness of breath history of endocarditis EXAM: PORTABLE CHEST 1 VIEW COMPARISON:  02/23/2016 FINDINGS: Median sternotomy wires are again evident. Left CP angle is not included. Stable enlarged cardiomediastinal silhouette, allowing for lordotic positioning. Mild central  vascular congestion. No consolidation or large effusion. No pneumothorax. Linear opacities at the thoracic inlet are unchanged. IMPRESSION: 1. Stable degree of cardiomegaly, there is mild central vascular congestion. 2. No large effusion or acute focal infiltrate. Electronically Signed   By: Jasmine Pang M.D.   On: 04/20/2016 18:29    PHYSICAL EXAM General: NAD Neck: JVP 16 cm, no thyromegaly or thyroid nodule.  Lungs: Decreased breath sounds at bases CV: Nondisplaced PMI.  Heart mildly tachy, regular S1/S2, no S3/S4, 2/6 SEM RUSB.  2+ edema to thighs.   Abdomen: Soft, nontender, no hepatosplenomegaly, no distention.  Neurologic: Alert and oriented x 3.  Psych: Normal affect. Extremities: No clubbing or cyanosis.   TELEMETRY: Reviewed telemetry pt in NSR with frequent PACs, short NSVT run  ASSESSMENT AND PLAN: [name] [name] is a 65 y.o. male is a patient with chronic diastolic CHF, AS s/p bioprosthetic AVR 2012 with MV repair at same time, mild CAD by cath in 2011, recurrent DVT/PE s/p IVC filter placement, DM2, HTN, HLD, and OSA on CPAP admitted for marked volume overload and worsening DOE. AHF team consulted for same.   1. Acute on chronic diastolic heart failure: Echo 05/01/16 LVEF 50-55%, RV mild/mod dilation and reduced function. Suspect R>L HF.  Concerned that degeneration of bioprosthetic aortic valve may be driving CHF.  Pt remains markedly volume overloaded on exam and he continues to dyspneic with mild exertion, much worse than his baseline. Weight down but unable to quantify diuresis given marked incontinence.  CVP 16 today.  Co-ox was preserved when last checked at 73%.  - He needs a foley catheter, place today.  - Lasix 80 mg IV every 8 hrs, metolazone 2.5 bid.  KCl 40 bid.  - Lisinopril on hold with soft BP and elevated creatinine.  - Pt will need RHC after more diuresis.  Currently following CVP off PICC.  2. 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  He has a repaired mitral valve with at least moderate MR on last echo.  He needs a repeat TEE to reassess the aortic and mitral valves.  - TEE next week when better diuresed.  3. CAD: Mild by cath 2011 as above. No CP.  4. Stage 3 CKD: Follow closely with diuresis.  Creatinine stable today.   5. Hx of recurrent DVT and PE: IVC filter in place - On Xarelto. Will hold 1 day prior to RHC, if he diureses well, perhaps cath  on Tuesday or Wednesday.  6. OSA: Continue nightly CPAP.  7. DM2: SSI while in hospital 8. Chronic anemia: Relatively stable. No overt bleeding.  GI and heme following as outpatient.  9. Rhythm: So far, I have only seen NSR with PACs, no atrial fibrillation.  Continue to follow.  10. Debilitated, likely will need SNF stay.  OT/PT to see.   Marca AnconaDalton Jetta Murray 05/05/2016 10:07 AM

## 2016-05-05 NOTE — Evaluation (Signed)
Physical Therapy Evaluation Patient Details Name: [name] PearlSteven C Roop MRN: [id number] DOB: 07/23/1950 Today's Date: 05/05/2016   History of Present Illness  pt presents with acute on chronic heart failure.  pt with hx HF, AVR, MVR, CAD, DVT, PE s/p IVC Filter, SM, HTN, and CKD.    Clinical Impression  Pt globally weak and debilitated.  Pt incontinent of urine in bed and was able to come to stand with one person A for hygiene and changing of linens.  Pt's HR fluctuated between 110s and 140s throughout mobility, RN aware.  Pt's BP 86/72 supine in bed and increased to 101/72 sitting EOB.  Feel pt would benefit from CIR level of therapies to maximize independence and decrease overall burden of care.  Will continue to follow.      Follow Up Recommendations CIR    Equipment Recommendations  None recommended by PT    Recommendations for Other Services Rehab consult     Precautions / Restrictions Precautions Precautions: Fall Precaution Comments: Watch HR Restrictions Weight Bearing Restrictions: No      Mobility  Bed Mobility Overal bed mobility: Needs Assistance Bed Mobility: Supine to Sit;Sit to Supine     Supine to sit: Mod assist;HOB elevated Sit to supine: Mod assist;HOB elevated   General bed mobility comments: cues for pt to do as much as he can as he tends to just reach out for PT to pull him up to sitting.  A with LEs and balancing trunk when coming to sitting.  pt HR up to 130s and 140s non sustained while sitting EOB.  RN aware.  Transfers Overall transfer level: Needs assistance Equipment used: Rolling walker (2 wheeled) Transfers: Sit to/from Stand Sit to Stand: Mod assist         General transfer comment: cues for UE use and using momentum with coming to standing.  pt indicates mild dizziness with coming to standing, but that it dissipates quickly.  pt HR up to 140's non sustained in standing with RN aware.    Ambulation/Gait             General Gait  Details: pt able to take 4 small side steps towards HOB with RW and MinA.  pt indicates too fatigued for further mobility.    Stairs            Wheelchair Mobility    Modified Rankin (Stroke Patients Only)       Balance Overall balance assessment: Needs assistance Sitting-balance support: No upper extremity supported;Feet supported Sitting balance-Leahy Scale: Good     Standing balance support: Bilateral upper extremity supported;During functional activity Standing balance-Leahy Scale: Poor                               Pertinent Vitals/Pain Pain Assessment: No/denies pain    Home Living Family/patient expects to be discharged to:: Inpatient rehab Living Arrangements: Spouse/significant other                    Prior Function Level of Independence: Independent         Comments: pt states Independent, but RN indicates wife says she helps pt at home.       Hand Dominance   Dominant Hand: Right    Extremity/Trunk Assessment   Upper Extremity Assessment: Generalized weakness           Lower Extremity Assessment: Generalized weakness      Cervical / Trunk  Assessment: Kyphotic  Communication   Communication: No difficulties  Cognition Arousal/Alertness: Awake/alert Behavior During Therapy: WFL for tasks assessed/performed Overall Cognitive Status: Within Functional Limits for tasks assessed                      General Comments      Exercises     Assessment/Plan    PT Assessment Patient needs continued PT services  PT Problem List Decreased strength;Decreased activity tolerance;Decreased balance;Decreased mobility;Decreased knowledge of use of DME;Cardiopulmonary status limiting activity;Obesity          PT Treatment Interventions DME instruction;Gait training;Stair training;Functional mobility training;Therapeutic activities;Therapeutic exercise;Balance training;Patient/family education    PT Goals (Current  goals can be found in the Care Plan section)  Acute Rehab PT Goals Patient Stated Goal: Get better and go home PT Goal Formulation: With patient Time For Goal Achievement: 05/19/16 Potential to Achieve Goals: Good    Frequency Min 3X/week   Barriers to discharge        Co-evaluation               End of Session Equipment Utilized During Treatment: Gait belt Activity Tolerance: Patient limited by fatigue Patient left: in bed;with call bell/phone within reach;with nursing/sitter in room Nurse Communication: Mobility status         Time: [id number] PT Time Calculation (min) (ACUTE ONLY): 15 min   Charges:   PT Evaluation $PT Eval Moderate Complexity: 1 Procedure     PT G CodesSunny Schlein:        Manan Olmo F, [place]PT [phone] 05/05/2016, 2:35 PM

## 2016-05-05 NOTE — Progress Notes (Signed)
Patient was incontinent of urine and soaked the entire bed and gown, etc. Patient struggled significantly trying to lean forward and couldn't get out of bed without significant help. Patient is very weak in both arms and legs. Once patient stood up he could only stand in place for 2-3 minutes. He c/o SOB with exertion even thought 02 sats were WNL (97%). Concerned about patient going home upon discharge. Will notify MD.

## 2016-05-05 NOTE — Progress Notes (Signed)
Patient placed on nasal CPAP and doing well. No O2 bleed in and settings of 16 cmh2o.

## 2016-05-06 ENCOUNTER — Inpatient Hospital Stay (HOSPITAL_COMMUNITY): Payer: Medicare Other

## 2016-05-06 DIAGNOSIS — N179 Acute kidney failure, unspecified: Secondary | ICD-10-CM

## 2016-05-06 DIAGNOSIS — J9600 Acute respiratory failure, unspecified whether with hypoxia or hypercapnia: Secondary | ICD-10-CM

## 2016-05-06 DIAGNOSIS — I509 Heart failure, unspecified: Secondary | ICD-10-CM

## 2016-05-06 DIAGNOSIS — R570 Cardiogenic shock: Secondary | ICD-10-CM

## 2016-05-06 DIAGNOSIS — J9601 Acute respiratory failure with hypoxia: Secondary | ICD-10-CM

## 2016-05-06 LAB — BLOOD GAS, ARTERIAL
Acid-base deficit: 16.3 mmol/L — ABNORMAL HIGH (ref 0.0–2.0)
Bicarbonate: 9.3 mmol/L — ABNORMAL LOW (ref 20.0–28.0)
O2 Content: 4 L/min
O2 Saturation: 98.4 %
PATIENT TEMPERATURE: 98.1
PCO2 ART: 20.2 mmHg — AB (ref 32.0–48.0)
PH ART: 7.284 — AB (ref 7.350–7.450)
PO2 ART: 133 mmHg — AB (ref 83.0–108.0)

## 2016-05-06 LAB — GLUCOSE, CAPILLARY
GLUCOSE-CAPILLARY: 81 mg/dL (ref 65–99)
GLUCOSE-CAPILLARY: 76 mg/dL (ref 65–99)
GLUCOSE-CAPILLARY: 93 mg/dL (ref 65–99)

## 2016-05-06 LAB — BASIC METABOLIC PANEL
ANION GAP: 17 — AB (ref 5–15)
Anion gap: 21 — ABNORMAL HIGH (ref 5–15)
BUN: 51 mg/dL — ABNORMAL HIGH (ref 6–20)
BUN: 48 mg/dL — AB (ref 6–20)
CHLORIDE: 102 mmol/L (ref 101–111)
CHLORIDE: 90 mmol/L — AB (ref 101–111)
CO2: 16 mmol/L — AB (ref 22–32)
CO2: 12 mmol/L — ABNORMAL LOW (ref 22–32)
CREATININE: 2.72 mg/dL — AB (ref 0.61–1.24)
CREATININE: 2.92 mg/dL — AB (ref 0.61–1.24)
Calcium: 8.8 mg/dL — ABNORMAL LOW (ref 8.9–10.3)
Calcium: 7.7 mg/dL — ABNORMAL LOW (ref 8.9–10.3)
GFR calc non Af Amer: 23 mL/min — ABNORMAL LOW (ref >60)
GFR, EST AFRICAN AMERICAN: 26 mL/min — AB (ref >60)
GFR, EST AFRICAN AMERICAN: 24 mL/min — AB (ref >60)
GFR, EST NON AFRICAN AMERICAN: 21 mL/min — AB (ref >60)
Glucose, Bld: 89 mg/dL (ref 65–99)
Glucose, Bld: 546 mg/dL (ref 65–99)
POTASSIUM: 5.1 mmol/L (ref 3.5–5.1)
Potassium: 5.5 mmol/L — ABNORMAL HIGH (ref 3.5–5.1)
SODIUM: 135 mmol/L (ref 135–145)
SODIUM: 123 mmol/L — AB (ref 135–145)

## 2016-05-06 LAB — CBC
HCT: 28.1 % — ABNORMAL LOW (ref 39.0–52.0)
HCT: 27.1 % — ABNORMAL LOW (ref 39.0–52.0)
HEMOGLOBIN: 8 g/dL — AB (ref 13.0–17.0)
Hemoglobin: 7.5 g/dL — ABNORMAL LOW (ref 13.0–17.0)
MCH: 22.2 pg — AB (ref 26.0–34.0)
MCH: 22.4 pg — ABNORMAL LOW (ref 26.0–34.0)
MCHC: 28.5 g/dL — ABNORMAL LOW (ref 30.0–36.0)
MCHC: 27.7 g/dL — ABNORMAL LOW (ref 30.0–36.0)
MCV: 77.8 fL — AB (ref 78.0–100.0)
MCV: 80.9 fL (ref 78.0–100.0)
PLATELETS: 222 10*3/uL (ref 150–400)
PLATELETS: 162 10*3/uL (ref 150–400)
RBC: 3.61 MIL/uL — AB (ref 4.22–5.81)
RBC: 3.35 MIL/uL — AB (ref 4.22–5.81)
RDW: 20.7 % — ABNORMAL HIGH (ref 11.5–15.5)
RDW: 20.6 % — AB (ref 11.5–15.5)
WBC: 14.6 10*3/uL — AB (ref 4.0–10.5)
WBC: 19.9 10*3/uL — AB (ref 4.0–10.5)

## 2016-05-06 LAB — COOXEMETRY PANEL
CARBOXYHEMOGLOBIN: 1.9 % — AB (ref 0.5–1.5)
CARBOXYHEMOGLOBIN: 2.6 % — AB (ref 0.5–1.5)
CARBOXYHEMOGLOBIN: 2 % — AB (ref 0.5–1.5)
Carboxyhemoglobin: 2.3 % — ABNORMAL HIGH (ref 0.5–1.5)
METHEMOGLOBIN: 1 % (ref 0.0–1.5)
METHEMOGLOBIN: 1 % (ref 0.0–1.5)
METHEMOGLOBIN: 1 % (ref 0.0–1.5)
Methemoglobin: 0.8 % (ref 0.0–1.5)
O2 SAT: 34.3 %
O2 Saturation: 28.2 %
O2 Saturation: 32.5 %
O2 Saturation: 53.8 %
TOTAL HEMOGLOBIN: 8.6 g/dL — AB (ref 12.0–16.0)
Total hemoglobin: 8.5 g/dL — ABNORMAL LOW (ref 12.0–16.0)
Total hemoglobin: 7.7 g/dL — ABNORMAL LOW (ref 12.0–16.0)
Total hemoglobin: 8.2 g/dL — ABNORMAL LOW (ref 12.0–16.0)

## 2016-05-06 LAB — POCT I-STAT 3, ART BLOOD GAS (G3+)
Acid-base deficit: 15 mmol/L — ABNORMAL HIGH (ref 0.0–2.0)
Bicarbonate: 10.5 mmol/L — ABNORMAL LOW (ref 20.0–28.0)
O2 SAT: 99 %
PCO2 ART: 22.5 mmHg — AB (ref 32.0–48.0)
PH ART: 7.275 — AB (ref 7.350–7.450)
PO2 ART: 142 mmHg — AB (ref 83.0–108.0)
TCO2: 11 mmol/L (ref 0–100)

## 2016-05-06 LAB — LACTIC ACID, PLASMA
LACTIC ACID, VENOUS: 10.2 mmol/L — AB (ref 0.5–1.9)
Lactic Acid, Venous: 10 mmol/L (ref 0.5–1.9)

## 2016-05-06 LAB — PROCALCITONIN: Procalcitonin: 1.32 ng/mL

## 2016-05-06 LAB — APTT: aPTT: 57 seconds — ABNORMAL HIGH (ref 24–36)

## 2016-05-06 LAB — HEPARIN LEVEL (UNFRACTIONATED): Heparin Unfractionated: >2.2 IU/mL — ABNORMAL HIGH (ref 0.30–0.70)

## 2016-05-06 MED ORDER — PRISMASOL BGK 4/2.5 32-4-2.5 MEQ/L IV SOLN
INTRAVENOUS | Status: DC
  Filled 2016-05-06 (×9): qty 5000

## 2016-05-06 MED ORDER — NOREPINEPHRINE BITARTRATE 1 MG/ML IV SOLN
0.0000 ug/min | INTRAVENOUS | Status: DC
  Filled 2016-05-06: qty 16

## 2016-05-06 MED ORDER — PIPERACILLIN-TAZOBACTAM 3.375 G IVPB
3.3750 g | Freq: Three times a day (TID) | INTRAVENOUS | Status: DC
  Filled 2016-05-06 (×3): qty 50

## 2016-05-06 MED ORDER — PIPERACILLIN-TAZOBACTAM 3.375 G IVPB 30 MIN
3.3750 g | Freq: Four times a day (QID) | INTRAVENOUS | Status: DC
  Filled 2016-05-06 (×3): qty 50

## 2016-05-06 MED ORDER — NOREPINEPHRINE BITARTRATE 1 MG/ML IV SOLN
3.0000 ug/min | INTRAVENOUS | Status: DC
  Administered 2016-05-06: 3 ug/min via INTRAVENOUS
  Filled 2016-05-06 (×2): qty 4

## 2016-05-06 MED ORDER — AMIODARONE LOAD VIA INFUSION
150.0000 mg | Freq: Once | INTRAVENOUS | Status: DC
  Filled 2016-05-06: qty 83.34

## 2016-05-06 MED ORDER — AMIODARONE HCL IN DEXTROSE 360-4.14 MG/200ML-% IV SOLN
INTRAVENOUS | Status: AC
  Filled 2016-05-06: qty 200

## 2016-05-06 MED ORDER — MIDAZOLAM HCL 2 MG/2ML IJ SOLN
1.0000 mg | INTRAMUSCULAR | Status: DC | PRN
  Filled 2016-05-06: qty 2

## 2016-05-06 MED ORDER — BISACODYL 10 MG RE SUPP: 10.0000 mg | Freq: Every day | RECTAL | Status: DC | PRN

## 2016-05-06 MED ORDER — FENTANYL CITRATE (PF) 100 MCG/2ML IJ SOLN
INTRAMUSCULAR | Status: AC
  Administered 2016-05-06: 100 ug
  Filled 2016-05-06: qty 4

## 2016-05-06 MED ORDER — MIDAZOLAM HCL 2 MG/2ML IJ SOLN
1.0000 mg | INTRAMUSCULAR | Status: DC | PRN
  Administered 2016-05-06: 1 mg via INTRAVENOUS

## 2016-05-06 MED ORDER — RIVAROXABAN 15 MG PO TABS: 15.0000 mg | ORAL_TABLET | Freq: Every day | ORAL | Status: DC

## 2016-05-06 MED ORDER — LEVALBUTEROL HCL 0.63 MG/3ML IN NEBU: 0.6300 mg | INHALATION_SOLUTION | RESPIRATORY_TRACT | Status: DC | PRN

## 2016-05-06 MED ORDER — HEPARIN SODIUM (PORCINE) 1000 UNIT/ML DIALYSIS
1000.0000 [IU] | INTRAMUSCULAR | Status: DC | PRN
  Filled 2016-05-06: qty 6

## 2016-05-06 MED ORDER — FENTANYL BOLUS VIA INFUSION
25.0000 ug | INTRAVENOUS | Status: DC | PRN
  Filled 2016-05-06: qty 25

## 2016-05-06 MED ORDER — DOBUTAMINE IN D5W 4-5 MG/ML-% IV SOLN
3.0000 ug/kg/min | INTRAVENOUS | Status: DC
  Administered 2016-05-06: 3 ug/kg/min via INTRAVENOUS
  Filled 2016-05-06: qty 250

## 2016-05-06 MED ORDER — DOPAMINE-DEXTROSE 3.2-5 MG/ML-% IV SOLN
3.0000 ug/kg/min | INTRAVENOUS | Status: DC
  Administered 2016-05-06: 3 ug/kg/min via INTRAVENOUS
  Filled 2016-05-06: qty 250

## 2016-05-06 MED ORDER — VASOPRESSIN 20 UNIT/ML IV SOLN
0.0300 [IU]/min | INTRAVENOUS | Status: DC
  Filled 2016-05-06: qty 2

## 2016-05-06 MED ORDER — DEXTROSE 5 % IV SOLN
3.0000 ug/min | INTRAVENOUS | Status: DC
  Administered 2016-05-06: 20 ug/min via INTRAVENOUS
  Filled 2016-05-06: qty 16

## 2016-05-06 MED ORDER — DOCUSATE SODIUM 50 MG/5ML PO LIQD: 100.0000 mg | Freq: Two times a day (BID) | ORAL | Status: DC | PRN

## 2016-05-06 MED ORDER — HEPARIN (PORCINE) IN NACL 100-0.45 UNIT/ML-% IJ SOLN
1500.0000 [IU]/h | INTRAMUSCULAR | Status: DC
  Filled 2016-05-06: qty 250

## 2016-05-06 MED ORDER — DOBUTAMINE-DEXTROSE 2-5 MG/ML-% IV SOLN
3.0000 ug/kg/min | INTRAVENOUS | Status: DC
  Filled 2016-05-06: qty 250

## 2016-05-06 MED ORDER — SODIUM CHLORIDE 0.9 % FOR CRRT
INTRAVENOUS_CENTRAL | Status: DC | PRN
  Filled 2016-05-06: qty 1000

## 2016-05-06 MED ORDER — NOREPINEPHRINE BITARTRATE 1 MG/ML IV SOLN
2.0000 ug/min | INTRAVENOUS | Status: DC
  Filled 2016-05-06: qty 4

## 2016-05-06 MED ORDER — SODIUM CHLORIDE 0.9 % IV SOLN: INTRAVENOUS | Status: DC | PRN

## 2016-05-06 MED ORDER — ETOMIDATE 2 MG/ML IV SOLN
40.0000 mg | Freq: Once | INTRAVENOUS | Status: AC
  Administered 2016-05-06: 40 mg via INTRAVENOUS

## 2016-05-06 MED ORDER — AMIODARONE HCL IN DEXTROSE 360-4.14 MG/200ML-% IV SOLN
60.0000 mg/h | INTRAVENOUS | Status: AC
  Administered 2016-05-06 (×2): 60 mg/h via INTRAVENOUS
  Filled 2016-05-06: qty 200

## 2016-05-06 MED ORDER — FUROSEMIDE 10 MG/ML IJ SOLN: 80.0000 mg | Freq: Two times a day (BID) | INTRAMUSCULAR | Status: DC

## 2016-05-06 MED ORDER — FENTANYL 2500MCG IN NS 250ML (10MCG/ML) PREMIX INFUSION
25.0000 ug/h | INTRAVENOUS | Status: DC
  Administered 2016-05-06: 75 ug/h via INTRAVENOUS
  Filled 2016-05-06 (×2): qty 250

## 2016-05-06 MED ORDER — ROCURONIUM BROMIDE 50 MG/5ML IV SOLN
1.0000 mg/kg | Freq: Once | INTRAVENOUS | Status: AC
  Administered 2016-05-06: 25 mg via INTRAVENOUS
  Filled 2016-05-06: qty 13.11

## 2016-05-06 MED ORDER — MIDAZOLAM HCL 2 MG/2ML IJ SOLN
INTRAMUSCULAR | Status: AC
  Administered 2016-05-06: 2 mg
  Filled 2016-05-06: qty 4

## 2016-05-06 MED ORDER — AMIODARONE HCL IN DEXTROSE 360-4.14 MG/200ML-% IV SOLN
30.0000 mg/h | INTRAVENOUS | Status: DC
Start: 2016-05-06 — End: 2016-05-06
  Filled 2016-05-06: qty 200

## 2016-05-06 MED ORDER — HEPARIN (PORCINE) IN NACL 100-0.45 UNIT/ML-% IJ SOLN: 1500.0000 [IU]/h | INTRAMUSCULAR | Status: DC

## 2016-05-06 MED ORDER — FUROSEMIDE 10 MG/ML IJ SOLN
80.0000 mg | Freq: Three times a day (TID) | INTRAMUSCULAR | Status: DC
  Administered 2016-05-06: 80 mg via INTRAVENOUS
  Filled 2016-05-06: qty 8

## 2016-05-06 MED ORDER — PRISMASOL BGK 4/2.5 32-4-2.5 MEQ/L IV SOLN
INTRAVENOUS | Status: DC
  Filled 2016-05-06: qty 5000

## 2016-05-06 MED ORDER — FENTANYL CITRATE (PF) 100 MCG/2ML IJ SOLN: 50.0000 ug | Freq: Once | INTRAMUSCULAR | Status: AC

## 2016-05-06 MED ORDER — VANCOMYCIN HCL 10 G IV SOLR
2000.0000 mg | Freq: Once | INTRAVENOUS | Status: AC
  Administered 2016-05-06: 2000 mg via INTRAVENOUS
  Filled 2016-05-06: qty 2000

## 2016-05-06 MED ORDER — VANCOMYCIN HCL 10 G IV SOLR: 1250.0000 mg | INTRAVENOUS | Status: DC

## 2016-05-07 ENCOUNTER — Ambulatory Visit (HOSPITAL_COMMUNITY): Payer: Medicare Other | Admitting: Oncology

## 2016-05-07 LAB — BLOOD CULTURE ID PANEL (REFLEXED)
Acinetobacter baumannii: NOT DETECTED
Candida albicans: NOT DETECTED
Candida glabrata: NOT DETECTED
Candida krusei: NOT DETECTED
Candida parapsilosis: NOT DETECTED
Candida tropicalis: NOT DETECTED
Enterobacter cloacae complex: NOT DETECTED
Enterobacteriaceae species: NOT DETECTED
Enterococcus species: NOT DETECTED
Escherichia coli: NOT DETECTED
Haemophilus influenzae: NOT DETECTED
Klebsiella oxytoca: NOT DETECTED
Klebsiella pneumoniae: NOT DETECTED
Listeria monocytogenes: NOT DETECTED
NEISSERIA MENINGITIDIS: NOT DETECTED
PROTEUS SPECIES: NOT DETECTED
PSEUDOMONAS AERUGINOSA: NOT DETECTED
SERRATIA MARCESCENS: NOT DETECTED
STAPHYLOCOCCUS AUREUS BCID: NOT DETECTED
STAPHYLOCOCCUS SPECIES: NOT DETECTED
STREPTOCOCCUS AGALACTIAE: NOT DETECTED
STREPTOCOCCUS PNEUMONIAE: NOT DETECTED
Streptococcus pyogenes: NOT DETECTED
Streptococcus species: NOT DETECTED

## 2016-05-07 LAB — URINE CULTURE: CULTURE: NO GROWTH

## 2016-05-07 MED FILL — Medication: Qty: 1 | Status: AC

## 2016-05-09 LAB — CULTURE, BLOOD (ROUTINE X 2)

## 2016-05-14 ENCOUNTER — Telehealth: Payer: Self-pay

## 2016-05-14 NOTE — Telephone Encounter (Signed)
On 05/14/2016 I [facility] The death certificate is for burial. The patient is a patient of Doctor Byrum. The death certificate will be taken to Pulmonary Unit @ [facility] this am for signature.  On 05/14/2016 I received the death certificate back from Doctor Byrum. I got the death certificate ready and called the funeral home to let them know the death certificate is ready for pickup.

## 2016-05-15 ENCOUNTER — Ambulatory Visit: Payer: Medicare Other | Admitting: Cardiovascular Disease

## 2016-05-24 NOTE — Progress Notes (Signed)
CRITICAL VALUE ALERT  Critical value received:  Lactic Acid 10  Date of notification:  03/28/2016  Time of notification:  1200  Critical value read back:Yes.    Nurse who received alert:  Ann MakiMegan Euphemia Lingerfelt  MD notified (1st page):  Byrum  Time of first page:  1205

## 2016-05-24 NOTE — Progress Notes (Signed)
RN had Elink camera in to pt to see he was breathing over the vent about 30 times/minute, with BP in the 60's - 70's on high dose levophed. No new orders received.  NP Renae Fickleaul arrived at 1730 to put in arterial line when pt began to code at 1754 with asystole and PEA. Pt expired at 1806. Family at bedside.

## 2016-05-24 NOTE — Consult Note (Signed)
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 Aundra Dubin for evaluation and recommendations surrounding acute oliguric kidney injury.    Pt was seen in heart failure clinic on 11/9 and was noted to have worsening dyspnea related to worsening RV function and wt gain of 25 lbs.  Of note, TTE in August 01/2016 reported LVEF 50-55%, tissue AVR with elevated mean gradient of 33 mmHg, mitral repair with trivial mitral regurgitation, also severe pulmonary hypertension with PASP 87 mmHg, reportedly normal RV contraction.  He was admitted for IV diuresis which has been difficult due to hypotension and tachycardia.  His hospital course has been complicated by worsening hypotension requiring pressors, acute hypoxic respiratory failure requiring BiPaP, and a lactate of 10.  His creatinine has risen from a baseline of around 1 (02/2016) --> 1.5 (03/2016) -->1.5 (11/112017) --> 2.9 (May 30, 2016).  He has made 10 cc of urine today.    Past Medical History:  Diagnosis Date  . Anemia   . 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 Arthritis   . CAD (coronary artery disease) 2008, 2011   Mild coronary atherosclerosis by cardiac catheterization  . CHF (congestive heart failure) ([place])   . Deep venous thrombosis (HCC)    Recurrent DVT and PE, status post IVC filter  . Depression   . Dyspnea   .  Essential hypertension   . GERD (gastroesophageal reflux disease)   . 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 Hyperlipidemia   . 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 Obesity   . OSA (obstructive sleep apnea)    CPAP  . Postphlebitic syndrome    Recurrent DVT  . Restless leg syndrome   . Type 2 diabetes mellitus ([place])     Past Surgical History:  Procedure Laterality Date  . AORTIC VALVE REPLACEMENT  [facility]10/2011   [facility]  . CARDIAC CATHETERIZATION  2008  . CARDIAC CATHETERIZATION  07/2009   Minor irregularities without obstructive disease  . COLONOSCOPY W/ POLYPECTOMY    . ESOPHAGOGASTRODUODENOSCOPY ENDOSCOPY    . IVC FILTER PLACEMENT ([facility] HX)    . MITRAL VALVE REPAIR  04/10/2011   Autologous pericardial patch augmentation of posterior leaflet with 214mSorin Memo 3D ring annuloplasty  . TEE WITHOUT CARDIOVERSION N/A 11/06/2015   Procedure: TRANSESOPHAGEAL ECHOCARDIOGRAM (TEE);  Surgeon: TiSkeet LatchMD;  [facility];  Service: Cardiovascular;  Laterality: N/A;  . TEE WITHOUT  CARDIOVERSION N/A 02/23/2016   Procedure: TRANSESOPHAGEAL ECHOCARDIOGRAM (TEE);  Surgeon: Fay Records, MD;  Location: [facility];  Service: Cardiovascular;  Laterality: N/A;  . THYROIDECTOMY    . TOTAL HIP ARTHROPLASTY Left 08/10/2015   Procedure: LEFT TOTAL HIP ARTHROPLASTY ANTERIOR APPROACH;  Surgeon: Mcarthur Rossetti, MD;  Location: [place];  Service: Orthopedics;  Laterality: Left;    Family History  Problem Relation Age of Onset  . Hypertension Father   . Hyperlipidemia Father   . CAD Father   . Diabetes Mother   . Hypertension Mother   . Cancer Mother   . CAD Mother   . Coronary artery disease Mother   . Stroke Mother   . Kidney disease Mother   . Hyperlipidemia Mother   . Diabetes Maternal  Grandmother   . Diabetes Maternal Grandfather   . Diabetes Paternal Grandmother   . Diabetes Paternal Grandfather    Social History:  reports that he quit smoking about 42 years ago. His smoking use included Cigarettes. He started smoking about 49 years ago. He has a 6.00 pack-year smoking history. He has never used smokeless tobacco. He reports that he does not drink alcohol or use drugs.  Allergies:  Allergies  Allergen Reactions  . Invokana [Canagliflozin] Rash    Medications Prior to Admission  Medication Sig Dispense Refill  . aspirin (ECOTRIN) 325 MG EC tablet Take 325 mg by mouth every evening.     . Cholecalciferol (VITAMIN D3) 5000 units CAPS Take 1 capsule by mouth daily.    . diazepam (VALIUM) 5 MG tablet Take 5 mg by mouth at bedtime.   0  . Ferrous Sulfate-C-Folic Acid 503-546-5.6 MG TBCR Take 500 mg by mouth 2 (two) times daily.     Marland Kitchen FLUoxetine (PROZAC) 40 MG capsule Take 40 mg by mouth daily.    Marland Kitchen HYDROcodone-acetaminophen (NORCO/VICODIN) 5-325 MG tablet take 1 tablet by mouth every 6 hours if needed for SEVERE PAIN  0  . levothyroxine (SYNTHROID, LEVOTHROID) 200 MCG tablet Take 200 mcg by mouth daily before breakfast.    . lisinopril (PRINIVIL,ZESTRIL) 10 MG tablet Take 10 mg by mouth at bedtime.     . metFORMIN (GLUCOPHAGE-XR) 500 MG 24 hr tablet Take 500 mg by mouth daily. & 2 in the evening  0  . metoprolol tartrate (LOPRESSOR) 25 MG tablet take 1 tablet by mouth once daily (Patient taking differently: take 1 tablet by mouth once daily in the evening) 90 tablet 3  . omeprazole (PRILOSEC) 20 MG capsule Take 20 mg by mouth at bedtime.     . potassium chloride (K-DUR) 10 MEQ tablet Take 1 tablet (10 mEq total) by mouth daily. 90 tablet 3  . rivaroxaban (XARELTO) 20 MG TABS tablet Take 1 tablet (20 mg total) by mouth daily with supper. 28 tablet 0  . simvastatin (ZOCOR) 40 MG tablet take 1 tablet by mouth at bedtime 90 tablet 3  . torsemide (DEMADEX) 20 MG tablet Take 2  tablets (40 mg total) by mouth daily. 60 tablet 3  . vitamin C (ASCORBIC ACID) 500 MG tablet Take 500 mg by mouth 2 (two) times daily.      Results for orders placed or performed during the hospital encounter of 05/01/2016 (from the past 48 hour(s))  Glucose, capillary     Status: Abnormal   Collection Time: 05/04/16  9:47 PM  Result Value Ref Range   Glucose-Capillary 144 (H) 65 - 99 mg/dL   Comment 1 Capillary Specimen  Comment 2 Notify RN   Basic metabolic panel     Status: Abnormal   Collection Time: 05/05/16  4:58 AM  Result Value Ref Range   Sodium 135 135 - 145 mmol/L   Potassium 3.5 3.5 - 5.1 mmol/L   Chloride 102 101 - 111 mmol/L   CO2 22 22 - 32 mmol/L   Glucose, Bld 148 (H) 65 - 99 mg/dL   BUN 34 (H) 6 - 20 mg/dL   Creatinine, Ser 1.66 (H) 0.61 - 1.24 mg/dL   Calcium 8.8 (L) 8.9 - 10.3 mg/dL   GFR calc non Af Amer 41 (L) >60 mL/min   GFR calc Af Amer 48 (L) >60 mL/min    Comment: (NOTE) The eGFR has been calculated using the CKD EPI equation. This calculation has not been validated in all clinical situations. eGFR's persistently <60 mL/min signify possible Chronic Kidney Disease.    Anion gap 11 5 - 15  CBC     Status: Abnormal   Collection Time: 05/05/16  4:58 AM  Result Value Ref Range   WBC 10.8 (H) 4.0 - 10.5 K/uL   RBC 3.63 (L) 4.22 - 5.81 MIL/uL   Hemoglobin 8.0 (L) 13.0 - 17.0 g/dL   HCT 27.7 (L) 39.0 - 52.0 %   MCV 76.3 (L) 78.0 - 100.0 fL   MCH 22.0 (L) 26.0 - 34.0 pg   MCHC 28.9 (L) 30.0 - 36.0 g/dL   RDW 20.2 (H) 11.5 - 15.5 %   Platelets 202 150 - 400 K/uL  Glucose, capillary     Status: Abnormal   Collection Time: 05/05/16  7:32 AM  Result Value Ref Range   Glucose-Capillary 150 (H) 65 - 99 mg/dL   Comment 1 Capillary Specimen   Glucose, capillary     Status: Abnormal   Collection Time: 05/05/16 11:04 AM  Result Value Ref Range   Glucose-Capillary 158 (H) 65 - 99 mg/dL   Comment 1 Capillary Specimen   Glucose, capillary     Status: Abnormal    Collection Time: 05/05/16  4:39 PM  Result Value Ref Range   Glucose-Capillary 131 (H) 65 - 99 mg/dL   Comment 1 Capillary Specimen   Glucose, capillary     Status: Abnormal   Collection Time: 05/05/16  9:24 PM  Result Value Ref Range   Glucose-Capillary 125 (H) 65 - 99 mg/dL   Comment 1 Capillary Specimen   Glucose, capillary     Status: Abnormal   Collection Time: 05/05/16 10:12 PM  Result Value Ref Range   Glucose-Capillary 124 (H) 65 - 99 mg/dL  Basic metabolic panel     Status: Abnormal   Collection Time: 2016/05/18  5:25 AM  Result Value Ref Range   Sodium 135 135 - 145 mmol/L   Potassium 5.5 (H) 3.5 - 5.1 mmol/L    Comment: DELTA CHECK NOTED NO VISIBLE HEMOLYSIS    Chloride 102 101 - 111 mmol/L   CO2 16 (L) 22 - 32 mmol/L   Glucose, Bld 89 65 - 99 mg/dL   BUN 51 (H) 6 - 20 mg/dL   Creatinine, Ser 2.72 (H) 0.61 - 1.24 mg/dL    Comment: DELTA CHECK NOTED   Calcium 8.8 (L) 8.9 - 10.3 mg/dL   GFR calc non Af Amer 23 (L) >60 mL/min   GFR calc Af Amer 26 (L) >60 mL/min    Comment: (NOTE) The eGFR has been calculated using the CKD EPI equation. This calculation has not been validated in  all clinical situations. eGFR's persistently <60 mL/min signify possible Chronic Kidney Disease.    Anion gap 17 (H) 5 - 15  CBC     Status: Abnormal   Collection Time: May 17, 2016  5:25 AM  Result Value Ref Range   WBC 14.6 (H) 4.0 - 10.5 K/uL   RBC 3.61 (L) 4.22 - 5.81 MIL/uL   Hemoglobin 8.0 (L) 13.0 - 17.0 g/dL   HCT 28.1 (L) 39.0 - 52.0 %   MCV 77.8 (L) 78.0 - 100.0 fL   MCH 22.2 (L) 26.0 - 34.0 pg   MCHC 28.5 (L) 30.0 - 36.0 g/dL   RDW 20.7 (H) 11.5 - 15.5 %   Platelets 222 150 - 400 K/uL  .Cooxemetry Panel (carboxy, met, total hgb, O2 sat)     Status: Abnormal   Collection Time: May 17, 2016  5:35 AM  Result Value Ref Range   Total hemoglobin 8.6 (L) 12.0 - 16.0 g/dL   O2 Saturation 28.2 %   Carboxyhemoglobin 1.9 (H) 0.5 - 1.5 %   Methemoglobin 1.0 0.0 - 1.5 %  .Cooxemetry  Panel (carboxy, met, total hgb, O2 sat)     Status: Abnormal   Collection Time: 2016-05-17  6:37 AM  Result Value Ref Range   Total hemoglobin 8.5 (L) 12.0 - 16.0 g/dL   O2 Saturation 32.5 %   Carboxyhemoglobin 2.3 (H) 0.5 - 1.5 %   Methemoglobin 0.8 0.0 - 1.5 %  Glucose, capillary     Status: None   Collection Time: 05-17-2016  7:36 AM  Result Value Ref Range   Glucose-Capillary 81 65 - 99 mg/dL   Comment 1 Capillary Specimen   Blood gas, arterial     Status: Abnormal   Collection Time: May 17, 2016 11:05 AM  Result Value Ref Range   O2 Content 4.0 L/min   Delivery systems NASAL CANNULA    pH, Arterial 7.284 (L) 7.350 - 7.450   pCO2 arterial 20.2 (L) 32.0 - 48.0 mmHg   pO2, Arterial 133 (H) 83.0 - 108.0 mmHg   Bicarbonate 9.3 (L) 20.0 - 28.0 mmol/L   Acid-base deficit 16.3 (H) 0.0 - 2.0 mmol/L   O2 Saturation 98.4 %   Patient temperature 98.1    Collection site A-LINE    Drawn by DRAWN BY RN    Sample type ARTERIAL DRAW    Allens test (pass/fail) PASS PASS  Glucose, capillary     Status: None   Collection Time: May 17, 2016 11:24 AM  Result Value Ref Range   Glucose-Capillary 76 65 - 99 mg/dL   Comment 1 Capillary Specimen   .Cooxemetry Panel (carboxy, met, total hgb, O2 sat)     Status: Abnormal   Collection Time: May 17, 2016 11:25 AM  Result Value Ref Range   Total hemoglobin 7.7 (L) 12.0 - 16.0 g/dL   O2 Saturation 53.8 %   Carboxyhemoglobin 2.6 (H) 0.5 - 1.5 %   Methemoglobin 1.0 0.0 - 1.5 %  Heparin level (unfractionated)     Status: Abnormal   Collection Time: 17-May-2016 11:32 AM  Result Value Ref Range   Heparin Unfractionated >2.20 (H) 0.30 - 0.70 IU/mL    Comment: RESULTS CONFIRMED BY MANUAL DILUTION REPEATED TO VERIFY        IF HEPARIN RESULTS ARE BELOW EXPECTED VALUES, AND PATIENT DOSAGE HAS BEEN CONFIRMED, SUGGEST FOLLOW UP TESTING OF ANTITHROMBIN III LEVELS.   APTT     Status: Abnormal   Collection Time: May 17, 2016 11:32 AM  Result Value Ref Range   aPTT 57 (H)  24 -  36 seconds    Comment:        IF BASELINE aPTT IS ELEVATED, SUGGEST PATIENT RISK ASSESSMENT BE USED TO DETERMINE APPROPRIATE ANTICOAGULANT THERAPY.   Basic metabolic panel     Status: Abnormal   Collection Time: 2016/05/09 11:32 AM  Result Value Ref Range   Sodium 123 (L) 135 - 145 mmol/L    Comment: DELTA CHECK NOTED   Potassium 5.1 3.5 - 5.1 mmol/L   Chloride 90 (L) 101 - 111 mmol/L   CO2 12 (L) 22 - 32 mmol/L   Glucose, Bld 546 (HH) 65 - 99 mg/dL    Comment: CRITICAL RESULT CALLED TO, READ BACK BY AND VERIFIED WITH: E.COLLERAN,RN 05/09/2016 1217 BY BSLADE    BUN 48 (H) 6 - 20 mg/dL   Creatinine, Ser 2.92 (H) 0.61 - 1.24 mg/dL   Calcium 7.7 (L) 8.9 - 10.3 mg/dL   GFR calc non Af Amer 21 (L) >60 mL/min   GFR calc Af Amer 24 (L) >60 mL/min    Comment: (NOTE) The eGFR has been calculated using the CKD EPI equation. This calculation has not been validated in all clinical situations. eGFR's persistently <60 mL/min signify possible Chronic Kidney Disease.    Anion gap 21 (H) 5 - 15  CBC     Status: Abnormal   Collection Time: 05-09-2016 11:32 AM  Result Value Ref Range   WBC 19.9 (H) 4.0 - 10.5 K/uL   RBC 3.35 (L) 4.22 - 5.81 MIL/uL   Hemoglobin 7.5 (L) 13.0 - 17.0 g/dL   HCT 27.1 (L) 39.0 - 52.0 %   MCV 80.9 78.0 - 100.0 fL   MCH 22.4 (L) 26.0 - 34.0 pg   MCHC 27.7 (L) 30.0 - 36.0 g/dL   RDW 20.6 (H) 11.5 - 15.5 %   Platelets 162 150 - 400 K/uL  Procalcitonin - Baseline     Status: None   Collection Time: 05-09-2016 11:32 AM  Result Value Ref Range   Procalcitonin 1.32 ng/mL    Comment:        Interpretation: PCT > 0.5 ng/mL and <= 2 ng/mL: Systemic infection (sepsis) is possible, but other conditions are known to elevate PCT as well. (NOTE)         ICU PCT Algorithm               Non ICU PCT Algorithm    ----------------------------     ------------------------------         PCT < 0.25 ng/mL                 PCT < 0.1 ng/mL     Stopping of antibiotics            Stopping  of antibiotics       strongly encouraged.               strongly encouraged.    ----------------------------     ------------------------------       PCT level decrease by               PCT < 0.25 ng/mL       >= 80% from peak PCT       OR PCT 0.25 - 0.5 ng/mL          Stopping of antibiotics  encouraged.     Stopping of antibiotics           encouraged.    ----------------------------     ------------------------------       PCT level decrease by              PCT >= 0.25 ng/mL       < 80% from peak PCT        AND PCT >= 0.5 ng/mL             Continuing antibiotics                                              encouraged.       Continuing antibiotics            encouraged.    ----------------------------     ------------------------------     PCT level increase compared          PCT > 0.5 ng/mL         with peak PCT AND          PCT >= 0.5 ng/mL             Escalation of antibiotics                                          strongly encouraged.      Escalation of antibiotics        strongly encouraged.   Lactic acid, plasma     Status: Abnormal   Collection Time: 05-30-16 11:34 AM  Result Value Ref Range   Lactic Acid, Venous 10.0 (HH) 0.5 - 1.9 mmol/L    Comment: CRITICAL RESULT CALLED TO, READ BACK BY AND VERIFIED WITH: M.BAILEY,RN 30-May-2016 1220 BY BSLADE   I-STAT 3, arterial blood gas (G3+)     Status: Abnormal   Collection Time: 05/30/2016 12:04 PM  Result Value Ref Range   pH, Arterial 7.275 (L) 7.350 - 7.450   pCO2 arterial 22.5 (L) 32.0 - 48.0 mmHg   pO2, Arterial 142.0 (H) 83.0 - 108.0 mmHg   Bicarbonate 10.5 (L) 20.0 - 28.0 mmol/L   TCO2 11 0 - 100 mmol/L   O2 Saturation 99.0 %   Acid-base deficit 15.0 (H) 0.0 - 2.0 mmol/L   Patient temperature HIDE    Sample type ARTERIAL   .Cooxemetry Panel (carboxy, met, total hgb, O2 sat)     Status: Abnormal   Collection Time: 05/30/2016  2:05 PM  Result Value Ref Range   Total  hemoglobin 8.2 (L) 12.0 - 16.0 g/dL   O2 Saturation 34.3 %   Carboxyhemoglobin 2.0 (H) 0.5 - 1.5 %   Methemoglobin 1.0 0.0 - 1.5 %  Lactic acid, plasma     Status: Abnormal   Collection Time: May 30, 2016  2:12 PM  Result Value Ref Range   Lactic Acid, Venous 10.2 (HH) 0.5 - 1.9 mmol/L    Comment: CRITICAL RESULT CALLED TO, READ BACK BY AND VERIFIED WITH: Kathlen Mody 1512 2016-05-30 D BRADLEY    Dg Chest Port 1 View  Result Date: 05-30-2016 CLINICAL DATA:  Dyspnea EXAM: PORTABLE CHEST 1 VIEW COMPARISON:  05/03/2016 FINDINGS: Diffuse bilateral airspace disease with progression. Probable edema. No effusion. Cardiac enlargement. PICC tip remains in the SVC.  IMPRESSION: Diffuse bilateral airspace disease with mild progression. Probable edema versus infection. Electronically Signed   By: Franchot Gallo M.D.   On: 15-May-2016 11:36    ROS: all other systems negative except described as per HPI  Blood pressure 98/62, pulse 92, temperature 97.6 F (36.4 C), temperature source Axillary, resp. rate (!) 26, height 5' 11"  (1.803 m), weight 131.1 kg (289 lb 1.6 oz), SpO2 100 %. Physical Exam  GEN: ill-appearing, NAD HEENT: EOMI, PERRL, BiPaP in place NECK: thick neck, difficult to appreciate JVD PULM: increased WOB, bilateral rhonchi Cv: tachycardic, regular, + systolic murmur ABD: soft, obese EXT: warm, 3+ bilateral pitting edema NEURO: sleepy but arousable  Assessment/Plan  1.  Acute on chronic oliguric kidney injury: underlying cardiorenal syndrome + ischemic insult resulting in ATN.  He is grossly volume overloaded and is in distress.  We will initiate CRRT. 2.  Volume overload: refractory to diuretics.  CRRT will help this; fluid removal may be difficult due to severe pHTN. 3.  Shock: initially thought to be cardiogenic shock but I also suspect septic shock may be playing a role given rising leukocytosis.  Svo2 30s, lactate 10.  On norepi and dobutamine (dopamine resulted in SVT). 4.   Leukocytosis.  Cultures pending, on empiric antibiotics 5.  Acute hypoxic respiratory failure: on BiPaP, considering intubation.  CCM consulted 6.  S/p AVR (bioprosthetic) and MV repair: needs another TEE per cardiology to assess for stenosis of AVR. 7.  Recurrent DVT/ PE : Xarelto stopped, on heparin gtt. 8.  Chronic anemia: followed by GI and heme as outpt, may need ESA. 9.  Dispo: have talked to pt and family about a time- limited trial of CRRT.  If no improvement in the next ~72 hrs, will likely need to reconsider goals of care.   Madelon Lips, MD [facility] Pgr: [phone] Cell: [phone] 2016/05/15, 4:01 PM

## 2016-05-24 NOTE — Discharge Summary (Signed)
Advanced Heart Failure Team  Discharge Summary   Patient ID: [name] [name] MRN: [id number], DOB/AGE: 66/02/1951 66 y.o. Admit date: 05/01/2016 D/C date:     October 12, 2015    Primary Discharge Diagnoses:  1. A/C Diastolic Heart Failure --RV Failure  2. PEA Arrest 05/07/19-17  3. H/O Bioprosthetic AVR and MVR 4. CAD  5. AKI on CKD 6. H/O recurrent DVT and PE- IVC filter in place 7. OSA 8. DMII 9. Chronic Anemia 10. Debilitated  .     Hospital Course:  [name] DecentSteven C Smithis a 65 y.o.maleis a patient with chronic diastolic CHF, AS s/p bioprosthetic AVR 2012 with MV repair at same time, mild CAD by cath in 2011, recurrent DVT/PE s/p IVC filter placement, DM2, HTN, HLD, and OSA on CPAP admitted for marked volume overload and worsening DOE.   AHF team consulted on November 10th with worsening heart failure. PICC line was placed to monitor CVP and CO-OX. Initial CO-OX was preserved at 73%.  CVP was high so he was diuresed with IV lasix but had poor response so metolazone was added but with no significant improvement. On November 13th , CO-OX dropped to 33% so dopamine was added at 3 mcg to support his RV however this was stopped due to tachycardia..  He became hypotensive and dyspneic with CVP up to 25.Blood and urine cultures obtained. He was switched to norepi and amiodarone. He was placed on Bipap but continued to decline. Nephrology consulted due to marked volume overload and worsening renal function.  CCM consulted for respiratory insufficiency and HD catheter. He required intubation and escalation of pressors due to persistent hypotension. Later that day he had PEA and received epinephrine, sodium bicarbonate, and defibrillation x2 as he did have some VF on monitor during code. He was bolused amiodarone as well.   After 12 minutes ACLS given his multiple medical co-morbidities and already near-end stage heart and kidney failure family was consulted and requested that we cease resuscitation attempts.  Asystole on monitor.    1. Acute on chronic diastolic heartfailure: Echo 05/01/16 LVEF 50-55%, RV mild/mod dilation and reduced function. Suspect R>L HF. Concerned that degeneration of bioprosthetic aortic valve was driving CHF. Attempted to diurese with IV lasix + metolazone with no response. CVP 25.  reAKI with creatinine up to 2.7.  Started dopamine @ 3 for RV support but later stopped due to tachycardia. .   2. PEA Arrest- October 12, 2015. Received epinephrine and bicarb. Followed by V fib with defibrillation x 2. ACLS provided over 12 minutes.  3.  Hx of bioprosthetic AVR and MV repair with previous endocarditis:  There wass significant concern for severe stenosis of the bioprosthetic aortic valve =>no mean gradient reported on 9/17 TEE, but on 5/17 TEE mean gradient was 52 mmHg. He has a repaired mitral valve with at least moderate MR on last echo.  4. CAD: Mild by cath 2011 as above.  5. AKI on Stage 3 CKD: Creatinine higher with BP drop overnight. On October 12, 2015 Nephrology consulted. Creatinine peaked at 2.92  6. Hx of recurrent DVT and PE: IVC filter in place 7. OSA: Continue nightly CPAP.  8. DM2: Covered with SSI  9. Chronic anemia: Relatively stable. No overt bleeding. GI and heme following as outpatient.  10. Debilitated  Duration of Discharge Encounter: Greater than 35 minutes   Signed, Amy Clegg NP-C  05/13/2016, 3:44 PM

## 2016-05-24 NOTE — Progress Notes (Addendum)
Pharmacy Antibiotic Note  [name] [name] is a 65 y.o. male admitted with acute on chronic diastolic heart failure. Today he was transferred to ICU with hypotension and cardiogenic shock. Also may be a component of sepsis - WBC up to 19, LA 10, CXR with possible infection. He will begin vancomycin and zosyn. He is in acute renal failure with sCr increased 1.66 > 2.72 > 2.92 - per CCM may need CRRT for volume removal.  Vancomycin trough goal 15-20  Plan: 1) Vancomycin 2g IV x 1 - follow up sCr trend +/- need for CRRT for further maintenance doses 2) Zosyn 3.375g IV q8 (4 hour infusion)    ADDENDUM: Patient to begin CRRT today. Will adjust antibiotic doses accordingly.  Plan: 1) Vancomycin 2g loading dose given at 1423, continue with 1250mg  IV q24 maintenance dose to begin tomorrow ~1400 2) Change zosyn to 3.375g IV q6   Height: 5\' 11"  (180.3 cm) Weight: 289 lb 1.6 oz (131.1 kg) IBW/kg (Calculated) : 75.3  Temp (24hrs), Avg:97.4 F (36.3 C), Min:97 F (36.1 C), Max:97.6 F (36.4 C)   Recent Labs Lab 05/16/2016 1821  05/04/16 1035 05/04/16 1200 05/05/16 0458 06/12/2016 0525 06/12/2016 1132 06/12/2016 1134  WBC 6.6  --  9.6  --  10.8* 14.6* 19.9*  --   CREATININE 1.40*  < > 1.57* 1.70* 1.66* 2.72* 2.92*  --   LATICACIDVEN  --   --   --   --   --   --   --  10.0*  < > = values in this interval not displayed.  Estimated Creatinine Clearance: 34.4 mL/min (by C-G formula based on SCr of 2.92 mg/dL (H)).    Allergies  Allergen Reactions  . Invokana [Canagliflozin] Rash    Antimicrobials this admission: 11/13 Vancomycin >> 11/13 Zosyn >>  Dose adjustments this admission: n/a  Microbiology results: 11/13 blood x2>> 11/13 urine >>  Thank you for allowing pharmacy to be a part of this patient's care.  Fredrik RiggerMarkle, Hether Anselmo Sue 25-May-2016 1:23 PM

## 2016-05-24 NOTE — Progress Notes (Signed)
Patient started on dopamine per orders. Patient went into SVT a few minutes after initiating dopamine. Dr. Shirlee LatchMclean at the bedside. Dopamine stopped and amiodarone started at 33.3 ml/hr per verbal order. Patient to be moved to 2H14 immediately.  Will assist with transfer and monitoring. - Clifton Safley,RN

## 2016-05-24 NOTE — Procedures (Signed)
Arterial Catheter Insertion Procedure Note Lysle PearlSteven C Mayol [id number] 09/22/1950  Procedure: Insertion of Arterial Catheter  Indications: Blood pressure monitoring  Procedure Details Consent: Risks of procedure as well as the alternatives and risks of each were explained to the (patient/caregiver).  Consent for procedure obtained. Time Out: Verified patient identification, verified procedure, site/side was marked, verified correct patient position, special equipment/implants available, medications/allergies/relevent history reviewed, required imaging and test results available.  Performed  Maximum sterile technique was used including antiseptics, cap, gloves, gown, hand hygiene, mask and sheet. Skin prep: Chlorhexidine; local anesthetic administered 20 gauge catheter was inserted into left radial artery using the Seldinger technique.  Evaluation Blood flow good; BP tracing good. Complications: No apparent complications.  Placed by lead RT Glo HerringKristin Sharpe   Alandra Sando 2016-06-16

## 2016-05-24 NOTE — Progress Notes (Addendum)
Patient ID: [name] [name], male   DOB: 12/14/1950, 65 y.o.   MRN: [id number]    SUBJECTIVE: He remains short of breath with any exertion.  Poor diuresis yesterday, creatinine up to 2.7, BP soft with SBP in 80s-90s generally.  Mild sinus tachycardia with frequent PACs still.  Co-ox low at 33% this morning, CVP 21.   Scheduled Meds: . cholecalciferol  5,000 Units Oral Daily  . diazepam  5 mg Oral QHS  . FLUoxetine  40 mg Oral Daily  . furosemide  80 mg Intravenous Q8H  . levothyroxine  200 mcg Oral QAC breakfast  . nystatin   Topical BID  . pantoprazole  40 mg Oral Daily  . rivaroxaban  15 mg Oral Q supper  . simvastatin  40 mg Oral QHS  . sodium chloride flush  10-40 mL Intracatheter Q12H  . sodium chloride flush  3 mL Intravenous Q12H   Continuous Infusions: . DOPamine     PRN Meds:.sodium chloride, acetaminophen, ondansetron (ZOFRAN) IV, sodium chloride flush, sodium chloride flush    Vitals:   Nov 27, 2015 0001 Nov 27, 2015 0400 Nov 27, 2015 0423 Nov 27, 2015 0500  BP: (!) 82/55  (!) 79/69   Pulse: (!) 124     Resp: (!) 24  19   Temp: 97.3 F (36.3 C) 97.6 F (36.4 C)    TempSrc: Axillary Axillary    SpO2: 94%     Weight:    289 lb 1.6 oz (131.1 kg)  Height:        Intake/Output Summary (Last 24 hours) at Nov 27, 2015 0736 Last data filed at 05/05/16 2300  Gross per 24 hour  Intake             1250 ml  Output              735 ml  Net              515 ml    LABS: Basic Metabolic Panel:  Recent Labs  53/66/4410/05/10 1035  05/05/16 0458 Nov 27, 2015 0525  NA 133*  < > 135 135  K 4.0  < > 3.5 5.5*  CL 104  < > 102 102  CO2 19*  < > 22 16*  GLUCOSE 200*  < > 148* 89  BUN 33*  < > 34* 51*  CREATININE 1.57*  < > 1.66* 2.72*  CALCIUM 8.6*  < > 8.8* 8.8*  MG 1.7  --   --   --   < > = values in this interval not displayed. Liver Function Tests: No results for input(s): AST, ALT, ALKPHOS, BILITOT, PROT, ALBUMIN in the last 72 hours. No results for input(s): LIPASE, AMYLASE in the last 72  hours. CBC:  Recent Labs  05/04/16 1035 05/05/16 0458 Nov 27, 2015 0525  WBC 9.6 10.8* 14.6*  NEUTROABS 8.3*  --   --   HGB 8.1* 8.0* 8.0*  HCT 28.1* 27.7* 28.1*  MCV 76.8* 76.3* 77.8*  PLT 209 202 222   Cardiac Enzymes: No results for input(s): CKTOTAL, CKMB, CKMBINDEX, TROPONINI in the last 72 hours. BNP: Invalid input(s): POCBNP D-Dimer: No results for input(s): DDIMER in the last 72 hours. Hemoglobin A1C: No results for input(s): HGBA1C in the last 72 hours. Fasting Lipid Panel: No results for input(s): CHOL, HDL, LDLCALC, TRIG, CHOLHDL, LDLDIRECT in the last 72 hours. Thyroid Function Tests: No results for input(s): TSH, T4TOTAL, T3FREE, THYROIDAB in the last 72 hours.  Invalid input(s): FREET3 Anemia Panel: No results for input(s): VITAMINB12, FOLATE, FERRITIN, TIBC, IRON,  RETICCTPCT in the last 72 hours.  RADIOLOGY: Dg Chest Port 1 View  Result Date: 05/03/2016 CLINICAL DATA:  PICC line placement. EXAM: PORTABLE CHEST 1 VIEW COMPARISON:  04/20/2016 FINDINGS: Right arm PICC line is seen in place with tip overlying the superior cavoatrial junction. Cardiomegaly stable. Increased diffuse interstitial infiltrates are seen, suspicious for pulmonary interstitial edema. No evidence of focal consolidation. Lung bases are not visualized on this study. Previous median sternotomy noted, with surgical clips in the superior mediastinum likely from prior thyroidectomy. IMPRESSION: Right arm PICC line tip overlies the superior cavoatrial junction. Increased diffuse interstitial infiltrates, suspicious for interstitial edema. Stable cardiomegaly. Electronically Signed   By: Myles RosenthalJohn  Stahl M.D.   On: 05/03/2016 17:24   Dg Chest Portable 1 View  Result Date: 04/20/2016 CLINICAL DATA:  Shortness of breath history of endocarditis EXAM: PORTABLE CHEST 1 VIEW COMPARISON:  02/23/2016 FINDINGS: Median sternotomy wires are again evident. Left CP angle is not included. Stable enlarged  cardiomediastinal silhouette, allowing for lordotic positioning. Mild central vascular congestion. No consolidation or large effusion. No pneumothorax. Linear opacities at the thoracic inlet are unchanged. IMPRESSION: 1. Stable degree of cardiomegaly, there is mild central vascular congestion. 2. No large effusion or acute focal infiltrate. Electronically Signed   By: Jasmine PangKim  Fujinaga M.D.   On: 04/20/2016 18:29    PHYSICAL EXAM General: NAD Neck: JVP 16+ cm, no thyromegaly or thyroid nodule.  Lungs: Decreased breath sounds at bases CV: Nondisplaced PMI.  Heart mildly tachy, regular S1/S2, no S3/S4, 2/6 SEM RUSB.  2+ edema to thighs.   Abdomen: Soft, nontender, no hepatosplenomegaly, no distention.  Neurologic: Alert and oriented x 3.  Psych: Normal affect. Extremities: No clubbing or cyanosis.   TELEMETRY: Reviewed telemetry pt in NSR with frequent PACs  ASSESSMENT AND PLAN: [name] PearlSteven C [name] is a 65 y.o. male is a patient with chronic diastolic CHF, AS s/p bioprosthetic AVR 2012 with MV repair at same time, mild CAD by cath in 2011, recurrent DVT/PE s/p IVC filter placement, DM2, HTN, HLD, and OSA on CPAP admitted for marked volume overload and worsening DOE. AHF team consulted for same.   1. Acute on chronic diastolic heart failure: Echo 05/01/16 LVEF 50-55%, RV mild/mod dilation and reduced function. Suspect R>L HF.  Concerned that degeneration of bioprosthetic aortic valve may be driving CHF.  Pt remains markedly volume overloaded on exam and he continues to dyspneic with mild exertion, much worse than his baseline. He is diuresing poorly.  Co-ox 33% this morning with soft BP (SBP 80s-90s) and CVP 21.  He is afebrile, WBCs very mild higher.  Think infection/sepsis less likely than cardiogenic shock. AKI with creatinine up to 2.7.  - Start dopamine @ 3 for RV support.   - After dopamine started, can resume diuresis with Lasix 80 mg IV every 8 hrs given marked volume overload.  - Pt will need RHC  after more diuresis.  Currently following CVP/co-ox off PICC.  2. Hx of bioprosthetic AVR and MV repair with previous endocarditis:  There is significant concern for severe stenosis of the bioprosthetic aortic valve => no mean gradient reported on 9/17 TEE, but on 5/17 TEE mean gradient was 52 mmHg.  He has a repaired mitral valve with at least moderate MR on last echo.  He needs a repeat TEE to reassess the aortic and mitral valves.  - TEE this week when better diuresed.  3. CAD: Mild by cath 2011 as above. No CP.  4. 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  Will add low dose dopamine for RV support and hopefully will bring up BP.  CVP 21 and markedly overloaded on exam.  Still needs diuresis => may end up needing renal for CVVH.   5. Hx of recurrent DVT and PE: IVC filter in place - Decrease Xarelto dose to 15 daily.   6. OSA: Continue nightly CPAP.  7. DM2: SSI while in hospital 8. Chronic anemia: Relatively stable. No overt bleeding.  GI and heme following as outpatient.  9. Rhythm: So far, I have only seen NSR with PACs, no atrial fibrillation.  Continue to follow => watch closely for atrial fibrillation with dopamine use.  10. Debilitated, PT recommend CIR.   40 minutes critical care time.    Marca Ancona 04/30/2016 7:36 AM  Patient became more tachycardic, suspect atrial fibrillation.  Will start amiodarone gtt and norepinephrine gtt rather than dopamine.  Transfer to CCU.  Update family.   Marca Ancona 04/27/2016 7:54 AM

## 2016-05-24 NOTE — Progress Notes (Signed)
OT Cancellation Note  Patient Details Name: Lysle PearlSteven C Burgo MRN: [id number] DOB: 10/18/1950   Cancelled Treatment:    Reason Eval/Treat Not Completed: Medical issues which prohibited therapy.  Pt now on BiPAP and on pressors.  Will reatttempt as medically stable.   Jeani HawkingConarpe, Staphanie Harbison M 05/13/2016, 1:59 PM

## 2016-05-24 NOTE — Progress Notes (Signed)
   Called by nursing staff for hypotension and increased WOB.   Todays WBC up to 14 and CO-OX 32%. Initially placed on dopamine but became tachycardic so he was switched to amio drip and norepinephrine.   He is SOB at rest. BiPap started. No urine output this morning.  CVP 25. Given 80 mg IV lasix x1.   Norepi increased to 20 mcg. SBP 90s. Repeat CXR now. Check BMET, blood cultures x 2, ua/urine culture, lactic acid now.   Stop xarelto and start heparin drip.   CCM consulted.   Amy Clegg NP-C  11:26 AM   Patient seen with NP, agree with the above note.  Discussed with family and Dr Delton CoombesByrum.  SBP around 90 now on norepinephrine.  Still minimal UOP.  Dyspneic, better with Bipap.  Will consult renal service for CVVH today, place dialysis catheter.  Start broad spectrum antibiotics for now with shock and get blood cultures.   30 minutes additional critical care time.   Marca AnconaDalton Candy Leverett 28-May-2016 1:40 PM

## 2016-05-24 NOTE — Procedures (Signed)
Pt placed on Bipap at this time per MD order, pt tolerating well, RT will monitor  

## 2016-05-24 NOTE — Progress Notes (Signed)
PCCM INTERVAL PROGRESS NOTE  To bedside to evaluate patient with refractory cardiogenic shock. He was on very high dose levophed and dobutamine. I was preparing to place arterial line as his prior arterial access had come out and respiratory therapy was unable to place another. Shortly after I entered the room he had a change of rhythm on the monitor and he upon evaluation was pulseless. PEA on the monitor. ACLS was initiated. See code sheet for exact details. He received epinephrine, sodium bicarbonate, and defibrillation x2 as he did have some VF on monitor during code. He was bolused amiodarone as well.   After 12 minutes ACLS given his multiple medical co-morbidities and already near-end stage heart and kidney failure family was consulted and requested that we cease resuscitation attempts. Asystole on monitor. TOD as described on code sheet.    Joneen RoachPaul Orissa Arreaga, AGACNP-BC [facility] Pager [phone] or [phone]  11-Nov-2015 6:23 PM

## 2016-05-24 NOTE — Progress Notes (Signed)
Critical Lab Value. Coox 32.5. MD notified and pt continuing to be monitored.

## 2016-05-24 NOTE — Progress Notes (Signed)
  Interdisciplinary Goals of Care Family Meeting   Date carried out:: 03/10/16  Location of the meeting: Bedside  Member's involved: Physician, Bedside Registered Nurse and Family Member or next of kin  Durable Power of Attorney or acting medical decision maker: Wife    Discussion: We discussed goals of care for [name] [name] .  They want to continue to be aggressive with the goal of getting him better. They understand that his status is tenuous and even with aggressive medical therapies this may not be attainable. The patient himself verbalizes that he would never want tracheostomy or to be on long term life support. They believe it is reasonable to give him a trial of CRRT and mechanical ventilation to see if this is something he can improve from.  Code status: Full Code  Disposition: Continue current acute care  Time spent for the meeting:  30 mins  HOFFMAN, PAUL W 03/10/16, 3:55 PM

## 2016-05-24 NOTE — Consult Note (Signed)
PULMONARY / CRITICAL CARE MEDICINE   Name: [name] PearlSteven C Debruin MRN: [id number] DOB: 09/19/1950    ADMISSION DATE:  05/19/2016 CONSULTATION DATE:  11/13  REFERRING MD:  Shirlee LatchMcLean (heart failure)   CHIEF COMPLAINT:  Respiratory failure   HISTORY OF PRESENT ILLNESS:   65yo male with multiple medical problems including chronic diastolic heart failure, previous endocarditis s/p AVR and MV repair 2012, ongoing moderate MR, recurrent DVT/PE on xarelto (s/p IVC filter), CKD 3, OSA, DM, HTN admitted 11/9 with worsening dyspnea and 20 lbs weight increase over several days after a bojangles biscuit.  Has been followed by heart failure team with attempts at diuresis which have been marginally successful.  On 11/12 had hypotension and tachycardia and was started on low dose dopamine without improvement.  On 11/13 had worsening dyspnea requiring bipap, ongoing hypotension requiring levophed, AKI and ongoing volume overload and PCCM consulted.   Dyspnea slightly improved on bipap.  Denies chest pain, hemoptysis.    PAST MEDICAL HISTORY :  He  has a past medical history of Anemia; Aortic insufficiency (11/2010); Arthritis; CAD (coronary artery disease) (2008, 2011); CHF (congestive heart failure) (HCC); Deep venous thrombosis (HCC); Depression; Dyspnea; Essential hypertension; GERD (gastroesophageal reflux disease); History of endocarditis; Hyperlipidemia; Mitral regurgitation; Obesity; OSA (obstructive sleep apnea); Postphlebitic syndrome; Restless leg syndrome; and Type 2 diabetes mellitus (HCC).  PAST SURGICAL HISTORY: He  has a past surgical history that includes Thyroidectomy; Mitral valve repair (04/10/2011); Aortic valve replacement (04/10/2011); Cardiac catheterization (2008); Cardiac catheterization (07/2009); Colonoscopy w/ polypectomy; IVC FILTER PLACEMENT ([facility] HX); Total hip arthroplasty (Left, 08/10/2015); TEE without cardioversion (N/A, 11/06/2015); Esophagogastroduodenoscopy endoscopy; and TEE without  cardioversion (N/A, 02/23/2016).  Allergies  Allergen Reactions  . Invokana [Canagliflozin] Rash    No current facility-administered medications on file prior to encounter.    Current Outpatient Prescriptions on File Prior to Encounter  Medication Sig  . aspirin (ECOTRIN) 325 MG EC tablet Take 325 mg by mouth every evening.   . Cholecalciferol (VITAMIN D3) 5000 units CAPS Take 1 capsule by mouth daily.  . diazepam (VALIUM) 5 MG tablet Take 5 mg by mouth at bedtime.   . Ferrous Sulfate-C-Folic Acid 105-500-0.8 MG TBCR Take 500 mg by mouth 2 (two) times daily.   Marland Kitchen. FLUoxetine (PROZAC) 40 MG capsule Take 40 mg by mouth daily.  Marland Kitchen. HYDROcodone-acetaminophen (NORCO/VICODIN) 5-325 MG tablet take 1 tablet by mouth every 6 hours if needed for SEVERE PAIN  . levothyroxine (SYNTHROID, LEVOTHROID) 200 MCG tablet Take 200 mcg by mouth daily before breakfast.  . lisinopril (PRINIVIL,ZESTRIL) 10 MG tablet Take 10 mg by mouth at bedtime.   . metFORMIN (GLUCOPHAGE-XR) 500 MG 24 hr tablet Take 500 mg by mouth daily. & 2 in the evening  . metoprolol tartrate (LOPRESSOR) 25 MG tablet take 1 tablet by mouth once daily (Patient taking differently: take 1 tablet by mouth once daily in the evening)  . omeprazole (PRILOSEC) 20 MG capsule Take 20 mg by mouth at bedtime.   . potassium chloride (K-DUR) 10 MEQ tablet Take 1 tablet (10 mEq total) by mouth daily.  . rivaroxaban (XARELTO) 20 MG TABS tablet Take 1 tablet (20 mg total) by mouth daily with supper.  . simvastatin (ZOCOR) 40 MG tablet take 1 tablet by mouth at bedtime  . torsemide (DEMADEX) 20 MG tablet Take 2 tablets (40 mg total) by mouth daily.  . vitamin C (ASCORBIC ACID) 500 MG tablet Take 500 mg by mouth 2 (two) times daily.  . [DISCONTINUED] ONGLYZA 5  MG TABS tablet Take 5 mg by mouth daily.     FAMILY HISTORY:  His indicated that his mother is deceased. He indicated that his father is deceased. He indicated that the status of his maternal grandmother  is unknown. He indicated that the status of his maternal grandfather is unknown. He indicated that the status of his paternal grandmother is unknown. He indicated that the status of his paternal grandfather is unknown.    SOCIAL HISTORY: He  reports that he quit smoking about 42 years ago. His smoking use included Cigarettes. He started smoking about 49 years ago. He has a 6.00 pack-year smoking history. He has never used smokeless tobacco. He reports that he does not drink alcohol or use drugs.  REVIEW OF SYSTEMS:   As per HPI - All other systems reviewed and were neg.    SUBJECTIVE:    VITAL SIGNS: BP 98/62   Pulse 94   Temp 97.6 F (36.4 C) (Axillary)   Resp (!) 24   Ht 5\' 11"  (1.803 m)   Wt 131.1 kg (289 lb 1.6 oz)   SpO2 96%   BMI 40.32 kg/m   HEMODYNAMICS: CVP:  [8 mmHg-20 mmHg] 20 mmHg  VENTILATOR SETTINGS: FiO2 (%):  [40 %] 40 % Set Rate:  [8 bmp-10 bmp] 8 bmp PEEP:  [4 cmH20-5 cmH20] 4 cmH20  INTAKE / OUTPUT: I/O last 3 completed shifts: In: 1670 [P.O.:1670] Out: 1185 [Urine:1185]  PHYSICAL EXAMINATION: General:  Obese, chronically ill appearing male, comfortable on bipap  Neuro:  Awake, alert, appropriate  HEENT:  Mm dry, bipap  Cardiovascular:  s1s2 rrr Lungs:  resps even, non labored, tachypneic on bipap, diminished throughout  Abdomen:  Obese, soft  Musculoskeletal:  Warm and dry, 2+ BLE edema   LABS:  BMET  Recent Labs Lab 05/04/16 1200 05/05/16 0458 05/19/2016 0525  NA 134* 135 135  K 4.0 3.5 5.5*  CL 104 102 102  CO2 20* 22 16*  BUN 35* 34* 51*  CREATININE 1.70* 1.66* 2.72*  GLUCOSE 165* 148* 89    Electrolytes  Recent Labs Lab 05/04/16 1035 05/04/16 1200 05/05/16 0458 05/21/2016 0525  CALCIUM 8.6* 8.6* 8.8* 8.8*  MG 1.7  --   --   --     CBC  Recent Labs Lab 05/04/16 1035 05/05/16 0458 05/13/2016 0525  WBC 9.6 10.8* 14.6*  HGB 8.1* 8.0* 8.0*  HCT 28.1* 27.7* 28.1*  PLT 209 202 222    Coag's No results for input(s):  APTT, INR in the last 168 hours.  Sepsis Markers No results for input(s): LATICACIDVEN, PROCALCITON, O2SATVEN in the last 168 hours.  ABG No results for input(s): PHART, PCO2ART, PO2ART in the last 168 hours.  Liver Enzymes No results for input(s): AST, ALT, ALKPHOS, BILITOT, ALBUMIN in the last 168 hours.  Cardiac Enzymes No results for input(s): TROPONINI, PROBNP in the last 168 hours.  Glucose  Recent Labs Lab 05/05/16 0732 05/05/16 1104 05/05/16 1639 05/05/16 2124 05/05/16 2212 04/26/2016 0736  GLUCAP 150* 158* 131* 125* 124* 81    Imaging No results found.   STUDIES:  2d Echo 11/8>>> EF 50-55%, mod MR, mild TR, PA peak pressure  CULTURES: BC x 2 11/13>>>  ANTIBIOTICS:   SIGNIFICANT EVENTS:   LINES/TUBES:   DISCUSSION: 65yo male with acute respiratory failure in setting acute on chronic diastolic heart failure with underlying valvular disease and CKD 3.  Now with worsening volume overload despite attempts at diuresis and AKI on CKD 3.  ASSESSMENT / PLAN:  PULMONARY Acute respiratory failure - r/t volume overload in setting acute on chronic diastolic heart failure likely c/b significant tachycardia on 11/12- now improved.   Hx OSA  P:   Continue bipap for now and qhs  F/u CXR now  F/u ABG  Diuresis per cards  HR control  PRN BD   CARDIOVASCULAR Acute on chronic diastolic heart failure  Hx endocarditis s/p AVR - concern for degeneration of bioprosthetic aortic valve  Mod MR  Hx PE/DVT on xarelto  Hypotension/shock - cardiogenic.  Co-ox 33, CVP 21 Tachycardia  Hx CAD  P:  Diuresis per heart failure team   Repeat TEE when more stable from resp standpoint  Amiodarone gtt per cards  Levophed gtt to maintain MAP >65 Heparin gtt per pharmacy  See renal - may ultimately need CVVHD for volume removal  Less likely sepsis but will check pct, lactate for completeness with mild leukocytosis  Needs goals of care discussion   RENAL AKI on  CKD3  Volume overload  AG metabolic acidosis  P:   F/u chem  F/u ABG  Diuresis per cards  Consider CVVHD for volume removal and metabolic acidosis    GASTROINTESTINAL No active issue  P:   NPO while on bipap   HEMATOLOGIC Anemia - mild  P:  Heparin gtt per pharmacy  F/u cbc   INFECTIOUS Leukocytosis - mild.  R/o infection with ongoing shock  P:   Lactate, pct  Low threshold to add broad spectrum abx if declines  cxr now   ENDOCRINE DM  P:   SSI   NEUROLOGIC No active issue  P:   Supportive care    FAMILY  - Updates:  Pt, Wife and daughter updated at length at bedside 11/13.  Confirmed full code.   - Inter-disciplinary family meet or Palliative Care meeting due by:  11/20    Dirk DressKaty Whiteheart, NP 2016/06/01  11:26 AM Pager: [phone] or [phone]  Attending Note:  I have examined patient, reviewed labs, studies and notes. I have discussed the case with Jasper RilingK Whiteheart, and I agree with the data and plans as amended above.   65 yo man with hx AVR, mitral repair (for SBE) with continued moderate dysfxn, HTN and diastolic dysfunction, CKD, OSA on CPAP 14 cm H2O, prior DVT with IVC filter.  Admitted with apparent total body volume overload and coexisting / resultant decompensated CHF 11/9. Diuresed aggressively with initial mild improvement. Developed acute on chronic renal dysfxn, shock 11/12. Progressed to significant pressor needs and acute resp failure 11/13. ABG consistent with significant metabolic component to his resp failure, lactate elevated. On my eval he is breathing comfortably on BiPAP 8/4, with Vt > 900's. Requiring norepi 20. Lungs distant and coarse. holosyst 2+ LE edema present. He has shock with lactic acidosis, presumed cardiogenic but consider also occult sepsis. Agree with BiPAP support, he is tolerating well. Will have to diurese as his BP will tolerate, probably be a bit more conservative since lactate is driving much of his dyspnea. Need  to eval for possible sources of sepsis. Will treat empirically with abx and narrow as cx's dictate. He is at risk for CVVHD given his oliguria.    Independent critical care time is 45 minutes.   Levy Pupaobert Byrum, MD, PhD 2016/06/01, 12:31 PM [facility] [phone] or if no answer [phone]

## 2016-05-24 NOTE — Procedures (Signed)
Intubation Procedure Note [name] [name] [id number] 09/14/1950  Procedure: Intubation Indications: Respiratory insufficiency  Procedure Details Consent: Risks of procedure as well as the alternatives and risks of each were explained to the (patient/caregiver).  Consent for procedure obtained. Time Out: Verified patient identification, verified procedure, site/side was marked, verified correct patient position, special equipment/implants available, medications/allergies/relevent history reviewed, required imaging and test results available.  Performed  Maximum sterile technique was used including gloves, hand hygiene and mask.  Mac-4 Glidescope.   8.0 ETT placed without difficulty using mac-4 glidescope. Placement confirmed bby direct visualization, ETCO2, auscultation Meds > etomidate 40mg , fentanyl 100mcg   Evaluation Hemodynamic Status: BP stable throughout; O2 sats: stable throughout Patient's Current Condition: stable Complications: No apparent complications Patient did tolerate procedure well. Chest X-ray ordered to verify placement.  CXR: pending.   Levy Pupaobert Vannary Greening, MD, PhD 05/17/2016, 4:38 PM [facility] [phone] or if no answer [phone]

## 2016-05-24 NOTE — Progress Notes (Signed)
CODE BLUE NOTE  Patient Name: [name] [name]   MRN: [id number]   Date of Birth/ Sex: 06/19/1951 , male      Admission Date: 04/24/2016  Attending Provider: Lyn RecordsHenry W Ganson, MD  Primary Diagnosis: heart failure    Indication: Pt was in his usual state of health until this PM, when he was noted to have no pulse, initial rhythm PEA. Code blue was subsequently called. At the time of arrival on scene, ACLS protocol was underway.   Technical Description:  - CPR performance duration:  12 minute  - Was defibrillation or cardioversion used? Yes   - Was external pacer placed? Yes  - Was patient intubated pre/post CPR? Yes    Medications Administered: Y = Yes; Blank = No Amiodarone  Y  Atropine    Calcium    Epinephrine    Lidocaine    Magnesium    Norepinephrine  Y  Phenylephrine    Sodium bicarbonate    Vasopressin    Other     Post CPR evaluation:  - Final Status - Was patient successfully resuscitated ? No   Miscellaneous Information:  - Time of death:  18:06 PM  - Primary team notified?  Yes  - Family Notified? Yes       Howard PouchLauren Mehgan Santmyer, MD   October 12, 2015, 6:19 PM

## 2016-05-24 NOTE — Progress Notes (Signed)
   2015-07-05 1755  Clinical Encounter Type  Visited With Family;Health care provider  Visit Type Code;Critical Care;Death  Referral From Nurse  Spiritual Encounters  Spiritual Needs Sacred text;Prayer;Emotional;Grief support  Stress Factors  Family Stress Factors Loss;Major life changes    Chaplain responded to Code Blue and encountered staff moving family to consultation room while staff was working with pt. Chaplain sat with family during code and while dr was discussing outcomes. Chaplain offered empathetic listening, sacred text reading, prayer and support. Family was escorted to pt bedside where staff answered questions. Family expressed that they will be using a funeral service in [place] called [employer] to assist their family.   Chaplain is available for further support if desired.   [employer] SystemsVirginia Hines Kloss, [address] Hospital [address]haplain

## 2016-05-24 NOTE — Procedures (Signed)
Central Venous Catheter Insertion Procedure Note [name] [name] [id number] 10/03/1950  Procedure: Insertion of Central Venous Catheter Indications: Assessment of intravascular volume, Drug and/or fluid administration and Frequent blood sampling  Procedure Details Consent: Risks of procedure as well as the alternatives and risks of each were explained to the (patient/caregiver).  Consent for procedure obtained. Time Out: Verified patient identification, verified procedure, site/side was marked, verified correct patient position, special equipment/implants available, medications/allergies/relevent history reviewed, required imaging and test results available.  Performed  Maximum sterile technique was used including antiseptics, cap, gloves, gown, hand hygiene, mask and sheet. Skin prep: Chlorhexidine; local anesthetic administered A antimicrobial bonded/coated triple lumen catheter was placed in the right internal jugular vein using the Seldinger technique. Ultrasound guidance used.Yes.   Catheter placed to 16 cm. Blood aspirated via all 3 ports and then flushed x 3. Line sutured x 2 and dressing applied.  Evaluation Blood flow good Complications: No apparent complications Patient did tolerate procedure well. Chest X-ray ordered to verify placement.  CXR: pending.  Joneen RoachPaul Roseann Kees, AGACNP-BC [facility]/Critical Care Pager [phone] or [phone]  October 13, 2015 4:54 PM

## 2016-05-24 NOTE — Progress Notes (Addendum)
Patient placed on CPAP and tolertaing well. No issues at this time, Will call if any further assistance is needed. Patient called back before leaving room and wanted back off and wanted to wait a little longer, said he would call when ready.

## 2016-05-24 NOTE — Progress Notes (Addendum)
ANTICOAGULATION CONSULT NOTE - Initial Consult  Pharmacy Consult for heparin Indication: Hx DVT/PE w/ IVC  Allergies  Allergen Reactions  . Invokana [Canagliflozin] Rash    Patient Measurements: Height: 5\' 11"  (180.3 cm) Weight: 289 lb 1.6 oz (131.1 kg) IBW/kg (Calculated) : 75.3 Heparin Dosing Weight: 105kg  Vital Signs: Temp: 97.6 F (36.4 C) (11/13 0400) Temp Source: Axillary (11/13 0400) BP: 98/62 (11/13 1109) Pulse Rate: 94 (11/13 1109)  Labs:  Recent Labs  05/04/16 1035 05/04/16 1200 05/05/16 0458 05/01/2016 0525  HGB 8.1*  --  8.0* 8.0*  HCT 28.1*  --  27.7* 28.1*  PLT 209  --  202 222  CREATININE 1.57* 1.70* 1.66* 2.72*    Estimated Creatinine Clearance: 36.9 mL/min (by C-G formula based on SCr of 2.72 mg/dL (H)).   Medical History: Past Medical History:  Diagnosis Date  . Anemia   . 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 Arthritis   . CAD (coronary artery disease) 2008, 2011   Mild coronary atherosclerosis by cardiac catheterization  . CHF (congestive heart failure) (HCC)   . Deep venous thrombosis (HCC)    Recurrent DVT and PE, status post IVC filter  . Depression   . Dyspnea   . Essential hypertension   . GERD (gastroesophageal reflux disease)   . 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 Hyperlipidemia   . 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 Obesity   . OSA (obstructive sleep apnea)    CPAP  . Postphlebitic syndrome    Recurrent DVT  . Restless leg syndrome   . Type 2 diabetes mellitus (HCC)    Assessment: 65 yoM on Xarelto PTA for hx PE/DVT now being transitioned to heparin gtt given AKI, worsening status, and possible upcoming procedures. Last dose of Xarelto was 11/12 (~1720), will start heparin with no bolus 24 hr later after obtaining baseline heparin level and  aPTT and dose based on aPTT until correlating with heparin levels. CBC low but stable, no S/Sx bleeding noted.  Goal of Therapy:  Heparin level 0.3-0.7 units/ml  APTT 66-102 seconds Monitor platelets by anticoagulation protocol: Yes   Plan:  -Obtain baseline heparin level and aPTT -Start heparin 1500 units/hr at 1730 -Follow-up 6-hr heparin level and aPTT -Daily heparin level and aPTT, CBC, S/Sx bleeding  Fredonia HighlandMichael Tinnie Kunin, PharmD PGY-1 Pharmacy Resident Pager: [phone] 05/07/2016

## 2016-05-24 NOTE — Progress Notes (Signed)
I responded to CODE BLUE called prior to start of CRRT at approximately 5:45pm.  Pt had been intubated over the course of the day, trialysis catheter placed.  CCM NP preparing to insert a-line for accurate monitoring of BP prior to initiation of CRRT.  ACLS protocol initiated.  I spoke to the family and updated them throughout the code, during which a pulse was not regained.  The chaplain was also present throughout the code.  Pt's mother and daughter were invited to witness the ongoing resuscitation efforts.  Pt's daughter briefly observed and then requested that the code be stopped.  Resuscitation efforts terminated.  Pt expired.     [name] ButtnerElizabeth Lilburn Straw MD [facility] Cell [phone] Pgr: [phone]

## 2016-05-24 DEATH — deceased

## 2017-05-16 IMAGING — DX DG HIP (WITH OR WITHOUT PELVIS) 2-3V*L*
3 series · 3 of 3 positions shown · non-contrast
Comparison: None.

CLINICAL DATA: Left knee pain, left hip pain, no injury

EXAM:
LEFT HIP (WITH PELVIS) 2-3 VIEWS

[hip ap]
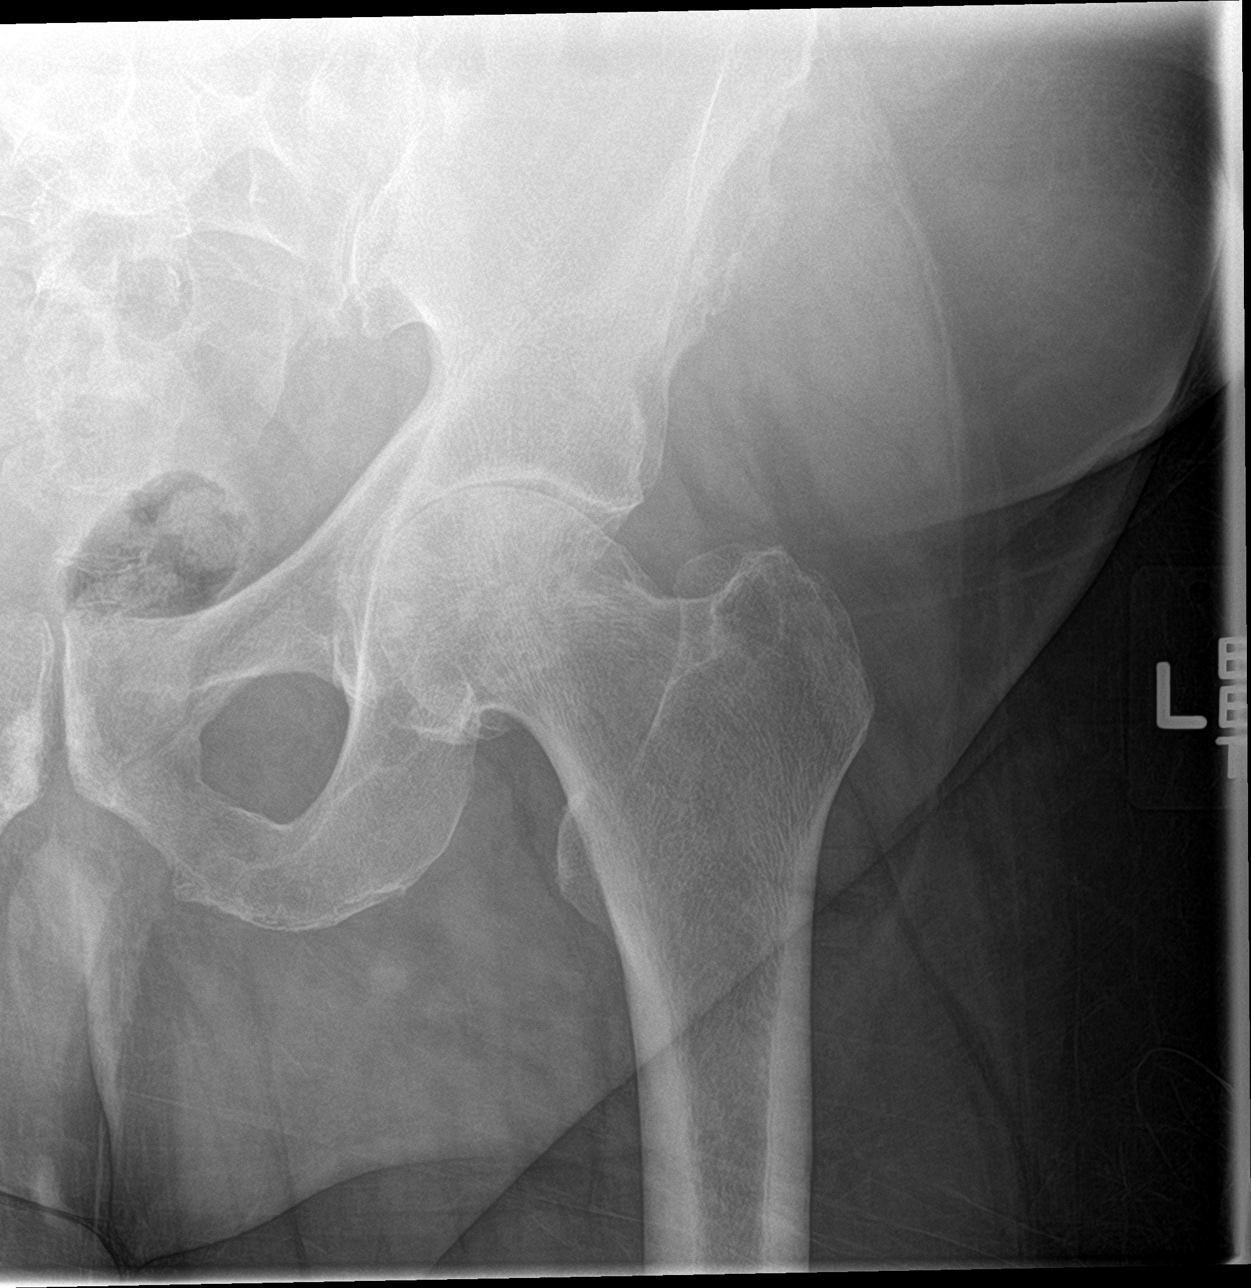

[hip lat]
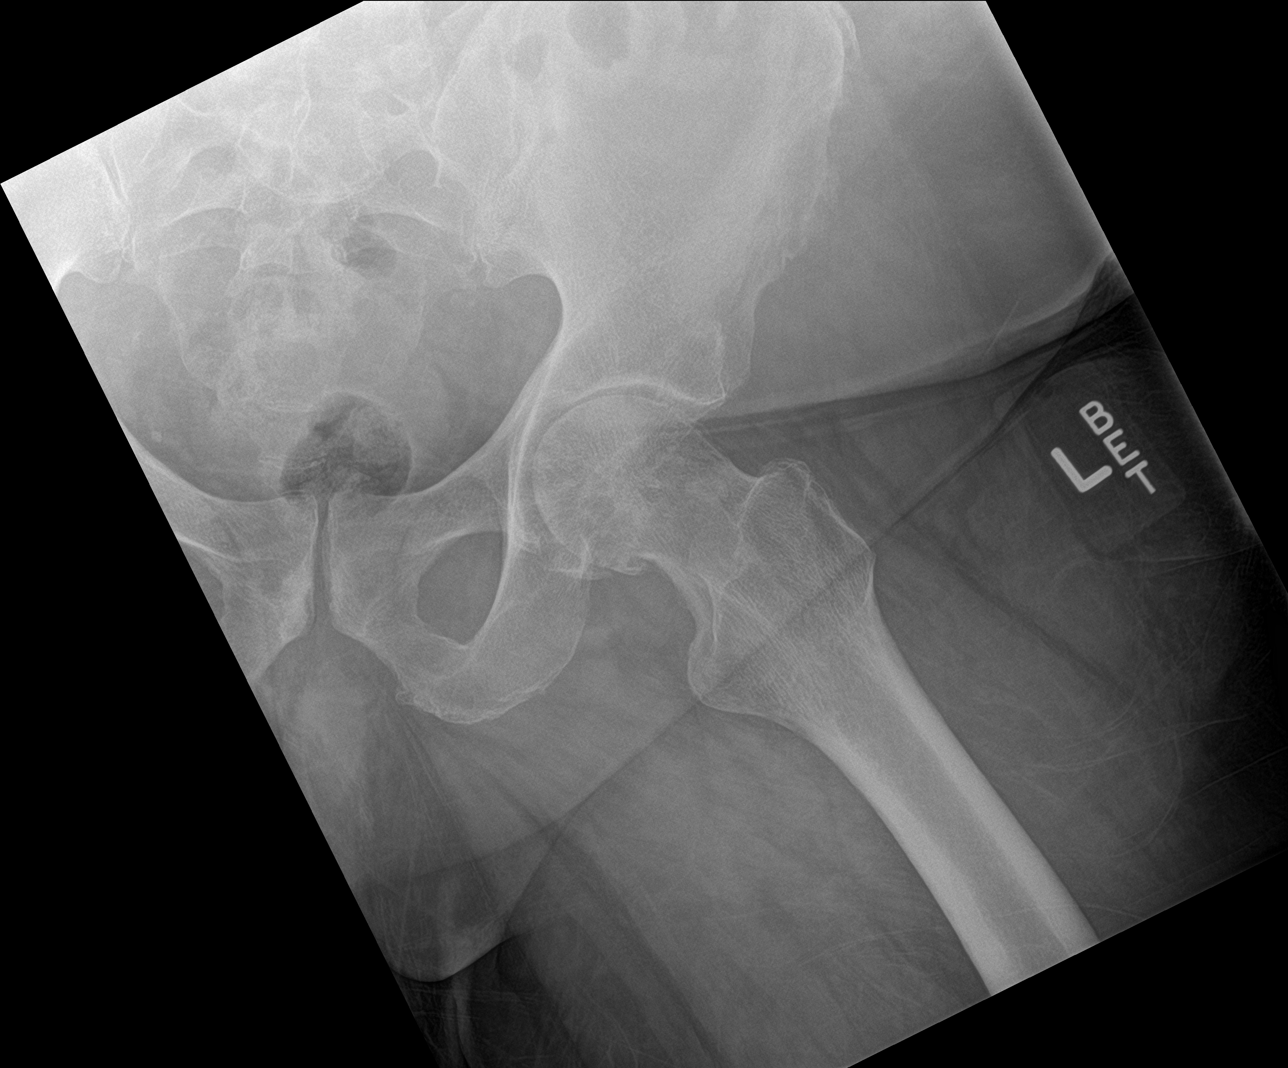

[pelvis ap]
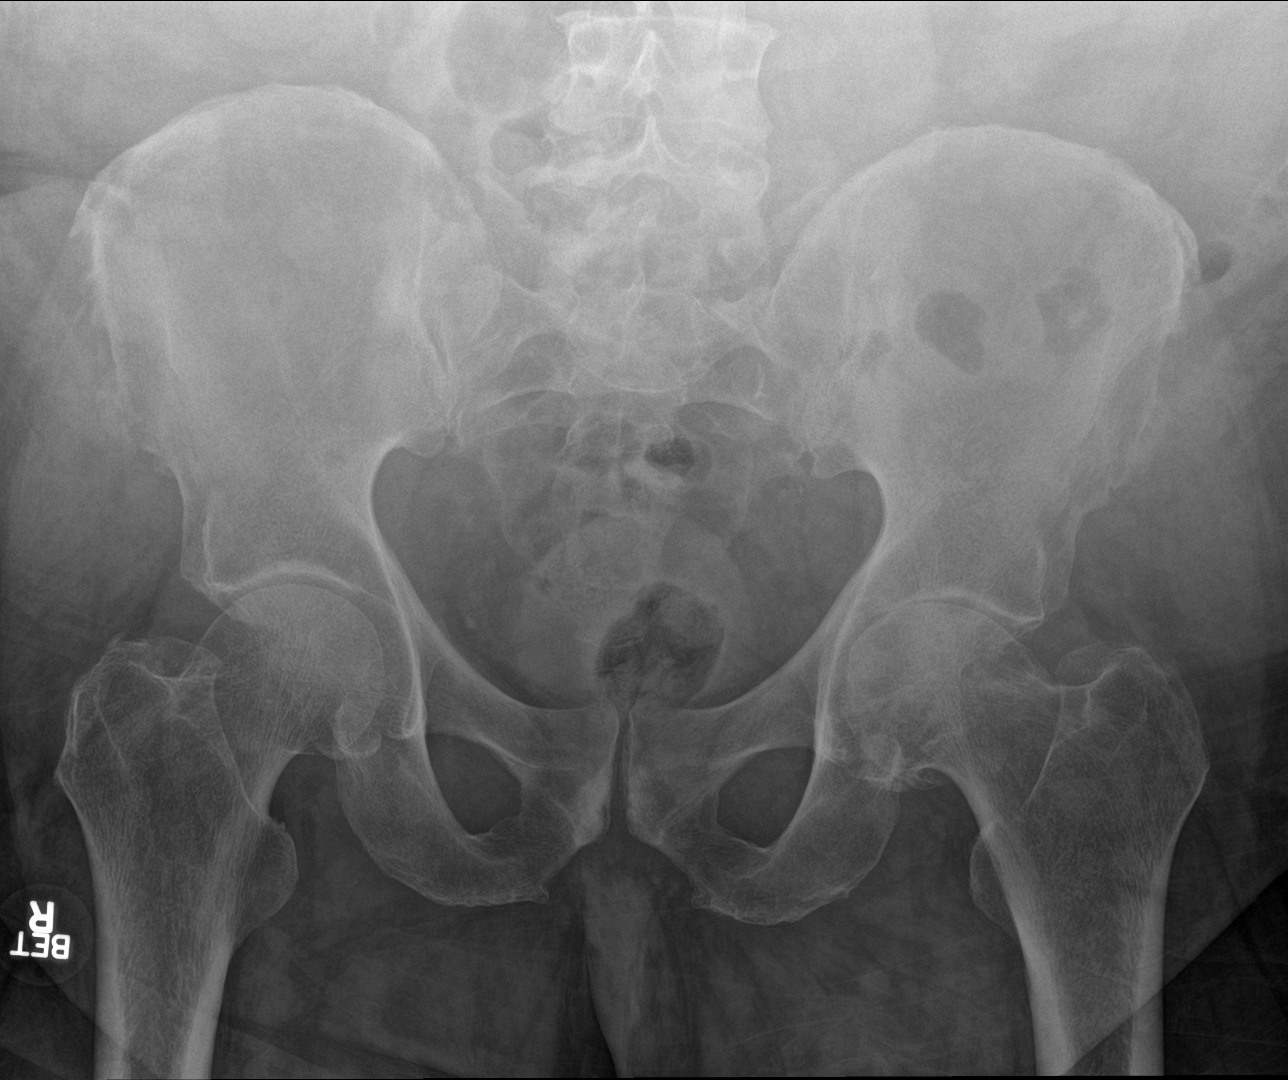

[3 of 3 positions shown; findings below may reference images not displayed]

FINDINGS: There is severe degenerative joint disease of the left hip for age
with virtually complete loss of joint space, sclerosis, and
spurring. No fracture is seen. Lesser degenerative change involves
the right hip. The pelvic rami are intact. The SI joints are
corticated.
IMPRESSION: Severe degenerative joint disease of the left hip for age.

## 2018-01-09 IMAGING — DX DG HIP (WITH OR WITHOUT PELVIS) 1V PORT*L*
1 series · 1 of 1 positions shown · non-contrast
Comparison: Fluoroscopy from earlier today

CLINICAL DATA: Status post total hip replacement on the left.

EXAM:
PORTABLE PELVIS 1-2 VIEWS; DG HIP (WITH OR WITHOUT PELVIS) 1V PORT
LEFT

[pelvis ap]
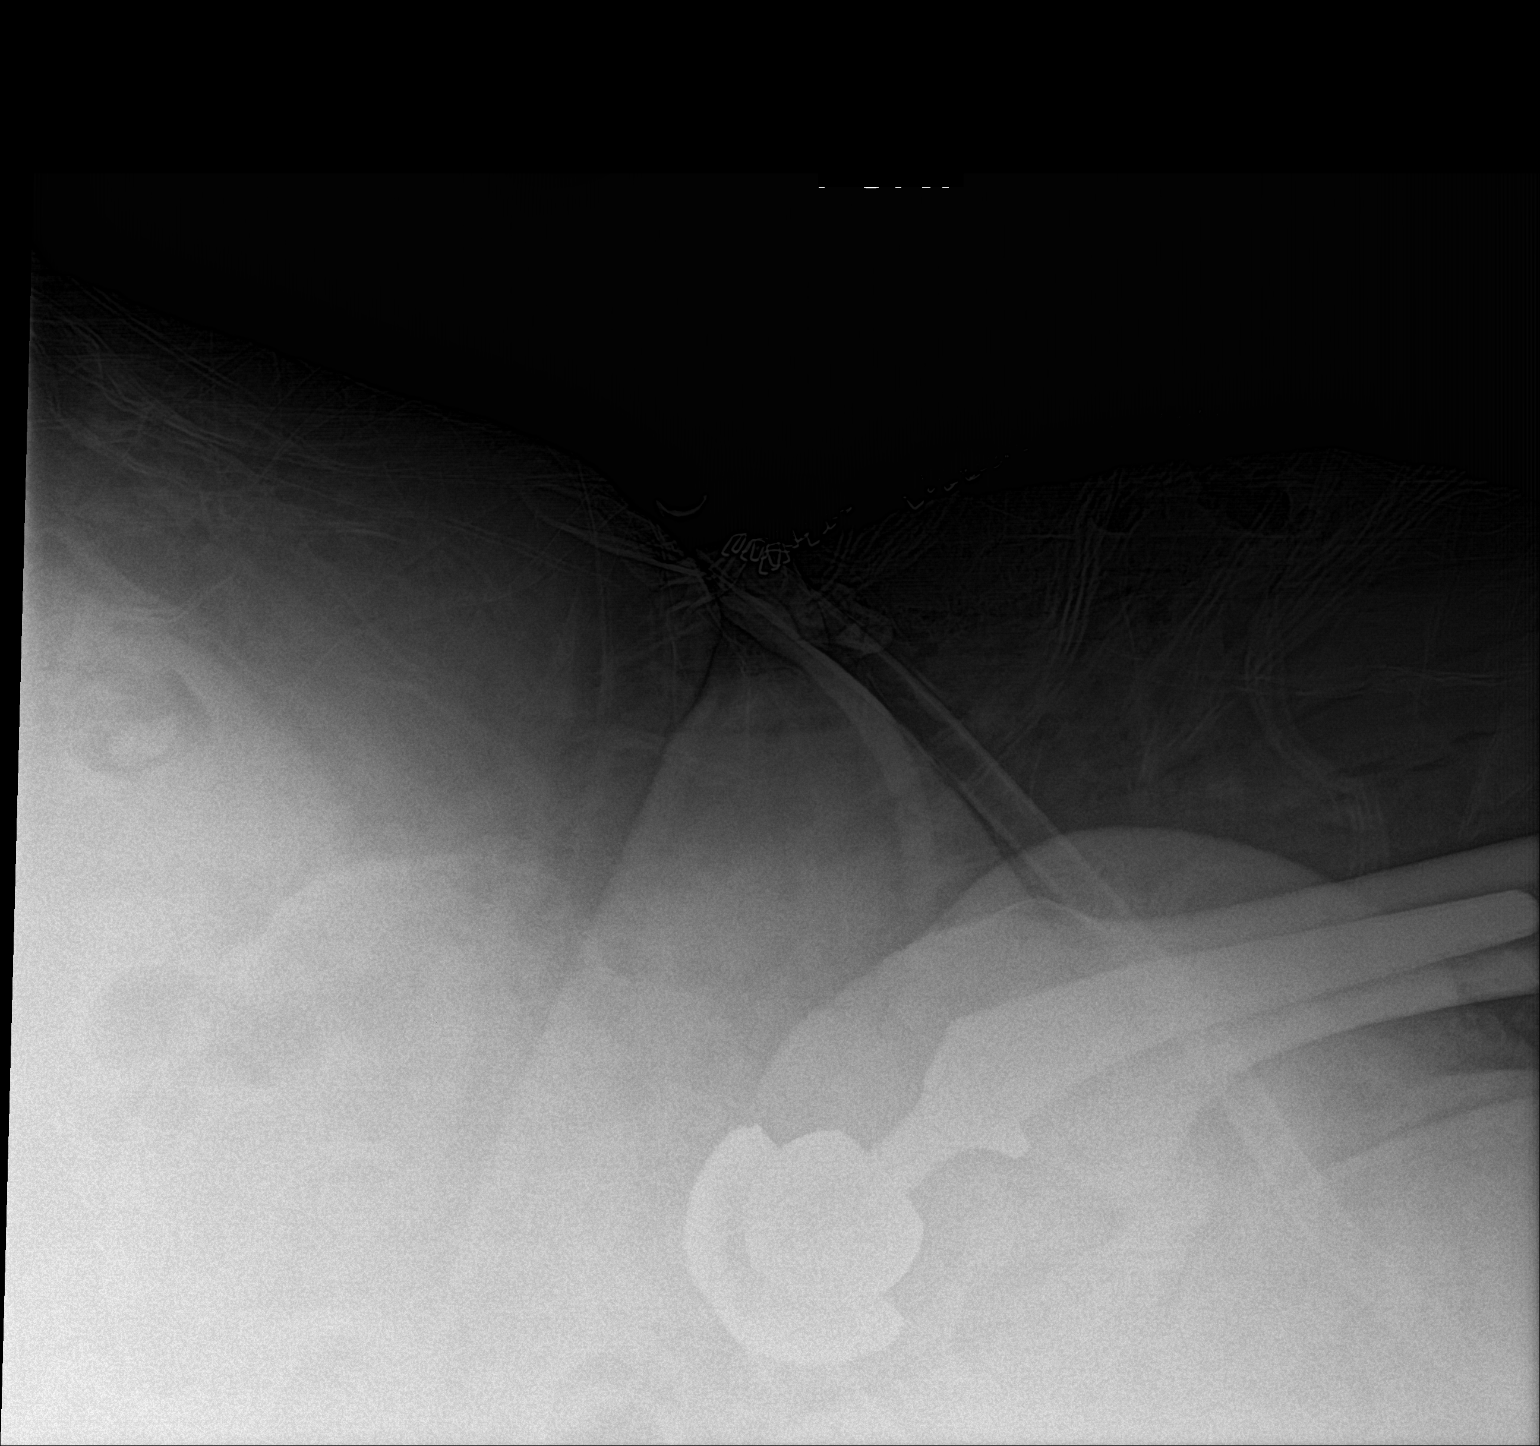

[1 of 1 positions shown; findings below may reference images not displayed]

FINDINGS: Total left hip arthroplasty without dislocation or periprosthetic
fracture. Negative right hip.
IMPRESSION: Unremarkable left hip arthroplasty.

## 2018-01-09 IMAGING — RF DG HIP (WITH PELVIS) OPERATIVE*L*
1 series · 2 of 2 positions shown · non-contrast
Comparison: Preoperative studies December 15, 2014.

CLINICAL DATA: Anterior approach left total hip arthroplasty ;
reported fluoro time 37 seconds

EXAM:
OPERATIVE left HIP (WITH PELVIS IF PERFORMED)  VIEWS
TECHNIQUE: Fluoroscopic spot image(s) were submitted for interpretation
post-operatively.

[Series 1: run · 2 of 2 slices shown]
[im 1/2]
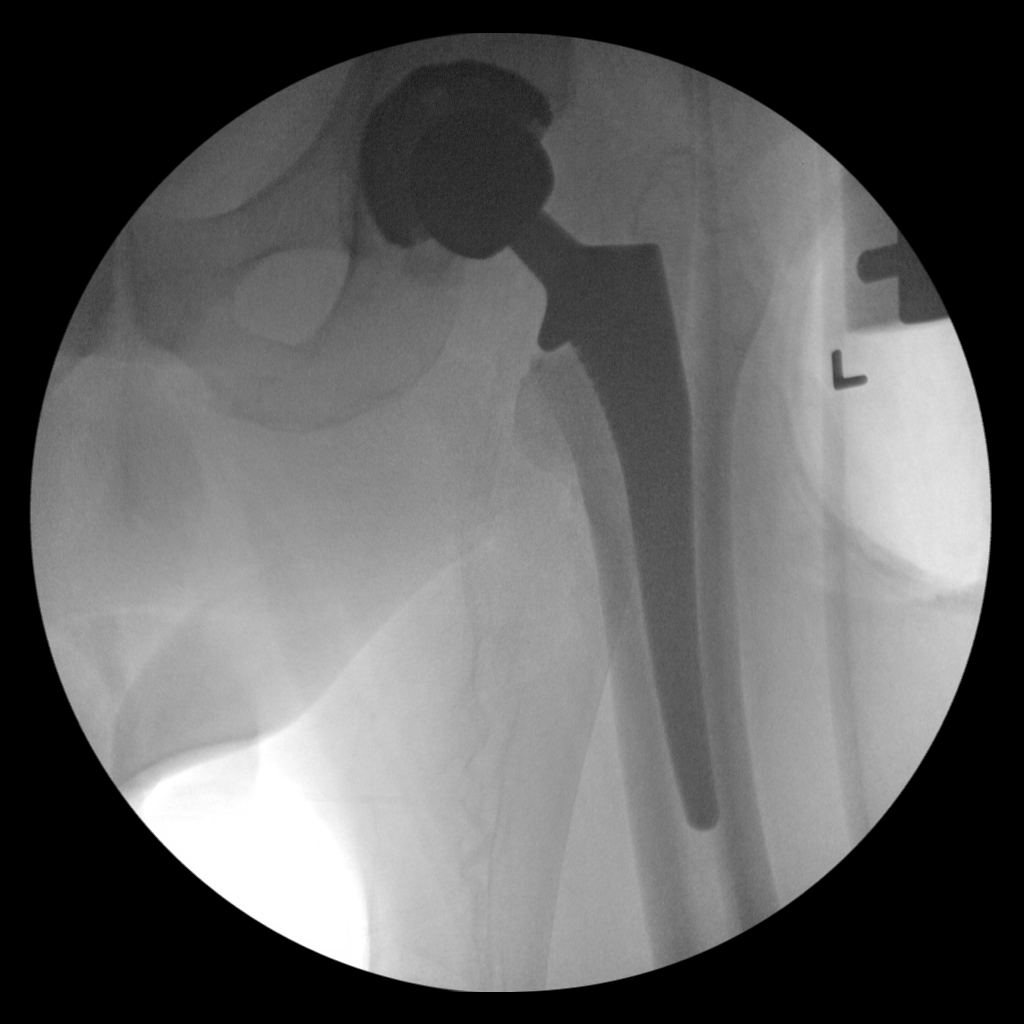
[im 2/2]
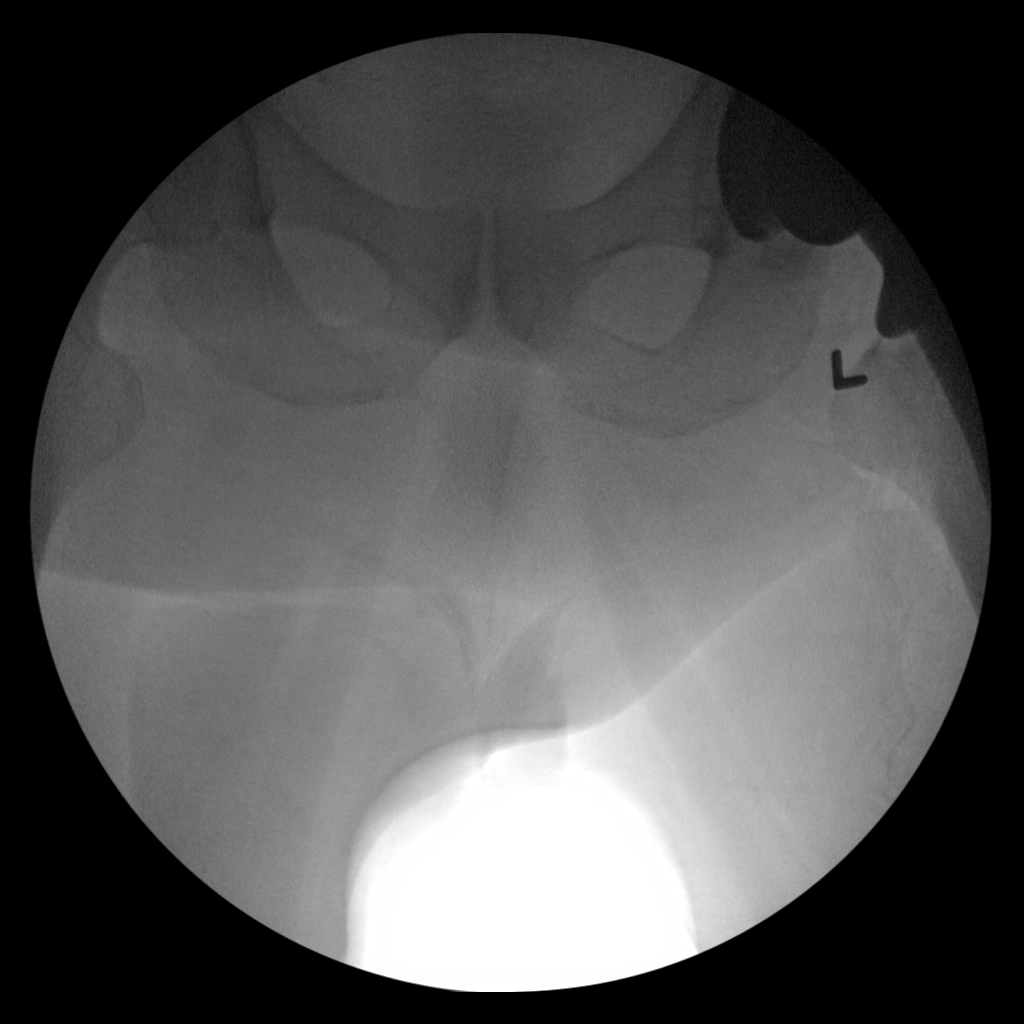

[2 of 2 positions shown; findings below may reference images not displayed]

FINDINGS: Two fluoro spot images are submitted. The prosthetic left hip joint
appears to be in appropriate position. The interface with the native
bone is normal. The observed portions of the native bones are
normal.
IMPRESSION: Status post left total hip joint replacement without evidence of
immediate postprocedure complication.
